# Patient Record
Sex: Female | Born: 1940 | Race: White | Hispanic: No | Marital: Married | Smoking: Never smoker
Health system: Southern US, Community
[De-identification: ages and names within clinical notes are randomized; demographics above are authoritative.]

## PROBLEM LIST (undated history)

## (undated) DIAGNOSIS — F329 Major depressive disorder, single episode, unspecified: Secondary | ICD-10-CM

## (undated) DIAGNOSIS — E669 Obesity, unspecified: Secondary | ICD-10-CM

## (undated) DIAGNOSIS — M25519 Pain in unspecified shoulder: Secondary | ICD-10-CM

## (undated) DIAGNOSIS — M1990 Unspecified osteoarthritis, unspecified site: Secondary | ICD-10-CM

## (undated) DIAGNOSIS — E119 Type 2 diabetes mellitus without complications: Secondary | ICD-10-CM

## (undated) DIAGNOSIS — J45909 Unspecified asthma, uncomplicated: Secondary | ICD-10-CM

## (undated) DIAGNOSIS — N939 Abnormal uterine and vaginal bleeding, unspecified: Secondary | ICD-10-CM

## (undated) DIAGNOSIS — Z8659 Personal history of other mental and behavioral disorders: Secondary | ICD-10-CM

## (undated) DIAGNOSIS — F039 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: Secondary | ICD-10-CM

## (undated) DIAGNOSIS — E785 Hyperlipidemia, unspecified: Secondary | ICD-10-CM

## (undated) DIAGNOSIS — F419 Anxiety disorder, unspecified: Secondary | ICD-10-CM

## (undated) DIAGNOSIS — I10 Essential (primary) hypertension: Secondary | ICD-10-CM

## (undated) DIAGNOSIS — E1165 Type 2 diabetes mellitus with hyperglycemia: Secondary | ICD-10-CM

## (undated) DIAGNOSIS — C50919 Malignant neoplasm of unspecified site of unspecified female breast: Secondary | ICD-10-CM

## (undated) DIAGNOSIS — J45901 Unspecified asthma with (acute) exacerbation: Secondary | ICD-10-CM

## (undated) DIAGNOSIS — M6530 Trigger finger, unspecified finger: Secondary | ICD-10-CM

## (undated) DIAGNOSIS — F32A Depression, unspecified: Secondary | ICD-10-CM

## (undated) DIAGNOSIS — J301 Allergic rhinitis due to pollen: Secondary | ICD-10-CM

## (undated) DIAGNOSIS — K2100 Gastro-esophageal reflux disease with esophagitis, without bleeding: Secondary | ICD-10-CM

## (undated) DIAGNOSIS — I4891 Unspecified atrial fibrillation: Secondary | ICD-10-CM

## (undated) DIAGNOSIS — I495 Sick sinus syndrome: Secondary | ICD-10-CM

## (undated) DIAGNOSIS — C801 Malignant (primary) neoplasm, unspecified: Secondary | ICD-10-CM

## (undated) HISTORY — PX: MASTECTOMY: SHX3

## (undated) HISTORY — PX: CHOLECYSTECTOMY: SHX55

## (undated) HISTORY — DX: Depression, unspecified: F32.A

## (undated) HISTORY — DX: Trigger finger, unspecified finger: M65.30

## (undated) HISTORY — DX: Unspecified dementia, unspecified severity, without behavioral disturbance, psychotic disturbance, mood disturbance, and anxiety: F03.90

## (undated) HISTORY — DX: Allergic rhinitis due to pollen: J30.1

## (undated) HISTORY — PX: RE-EXCISION OF BREAST LUMPECTOMY: SHX6048

## (undated) HISTORY — DX: Anxiety disorder, unspecified: F41.9

## (undated) HISTORY — DX: Gastro-esophageal reflux disease with esophagitis, without bleeding: K21.00

## (undated) HISTORY — DX: Unspecified asthma, uncomplicated: J45.909

## (undated) NOTE — *Deleted (*Deleted)
Diabetes Mellitus and Nutrition, Adult When you have diabetes (diabetes mellitus), it is very important to have healthy eating habits because your blood sugar (glucose) levels are greatly affected by what you eat and drink. Eating healthy foods in the appropriate amounts, at about the same times every day, can help you:  Control your blood glucose.  Lower your risk of heart disease.  Improve your blood pressure.  Reach or maintain a healthy weight. Every person with diabetes is different, and each person has different needs for a meal plan. Your health care provider may recommend that you work with a diet and nutrition specialist (dietitian) to make a meal plan that is best for you. Your meal plan may vary depending on factors such as:  The calories you need.  The medicines you take.  Your weight.  Your blood glucose, blood pressure, and cholesterol levels.  Your activity level.  Other health conditions you have, such as heart or kidney disease. How do carbohydrates affect me? Carbohydrates, also called carbs, affect your blood glucose level more than any other type of food. Eating carbs naturally raises the amount of glucose in your blood. Carb counting is a method for keeping track of how many carbs you eat. Counting carbs is important to keep your blood glucose at a healthy level, especially if you use insulin or take certain oral diabetes medicines. It is important to know how many carbs you can safely have in each meal. This is different for every person. Your dietitian can help you calculate how many carbs you should have at each meal and for each snack. Foods that contain carbs include:  Bread, cereal, rice, pasta, and crackers.  Potatoes and corn.  Peas, beans, and lentils.  Milk and yogurt.  Fruit and juice.  Desserts, such as cakes, cookies, ice cream, and candy. How does alcohol affect me? Alcohol can cause a sudden decrease in blood glucose (hypoglycemia),  especially if you use insulin or take certain oral diabetes medicines. Hypoglycemia can be a life-threatening condition. Symptoms of hypoglycemia (sleepiness, dizziness, and confusion) are similar to symptoms of having too much alcohol. If your health care provider says that alcohol is safe for you, follow these guidelines:  Limit alcohol intake to no more than 1 drink per day for nonpregnant women and 2 drinks per day for men. One drink equals 12 oz of beer, 5 oz of wine, or 1 oz of hard liquor.  Do not drink on an empty stomach.  Keep yourself hydrated with water, diet soda, or unsweetened iced tea.  Keep in mind that regular soda, juice, and other mixers may contain a lot of sugar and must be counted as carbs. What are tips for following this plan?  Reading food labels  Start by checking the serving size on the "Nutrition Facts" label of packaged foods and drinks. The amount of calories, carbs, fats, and other nutrients listed on the label is based on one serving of the item. Many items contain more than one serving per package.  Check the total grams (g) of carbs in one serving. You can calculate the number of servings of carbs in one serving by dividing the total carbs by 15. For example, if a food has 30 g of total carbs, it would be equal to 2 servings of carbs.  Check the number of grams (g) of saturated and trans fats in one serving. Choose foods that have low or no amount of these fats.  Check the number of   milligrams (mg) of salt (sodium) in one serving. Most people should limit total sodium intake to less than 2,300 mg per day.  Always check the nutrition information of foods labeled as "low-fat" or "nonfat". These foods may be higher in added sugar or refined carbs and should be avoided.  Talk to your dietitian to identify your daily goals for nutrients listed on the label. Shopping  Avoid buying canned, premade, or processed foods. These foods tend to be high in fat, sodium,  and added sugar.  Shop around the outside edge of the grocery store. This includes fresh fruits and vegetables, bulk grains, fresh meats, and fresh dairy. Cooking  Use low-heat cooking methods, such as baking, instead of high-heat cooking methods like deep frying.  Cook using healthy oils, such as olive, canola, or sunflower oil.  Avoid cooking with butter, cream, or high-fat meats. Meal planning  Eat meals and snacks regularly, preferably at the same times every day. Avoid going long periods of time without eating.  Eat foods high in fiber, such as fresh fruits, vegetables, beans, and whole grains. Talk to your dietitian about how many servings of carbs you can eat at each meal.  Eat 4-6 ounces (oz) of lean protein each day, such as lean meat, chicken, fish, eggs, or tofu. One oz of lean protein is equal to: ? 1 oz of meat, chicken, or fish. ? 1 egg. ?  cup of tofu.  Eat some foods each day that contain healthy fats, such as avocado, nuts, seeds, and fish. Lifestyle  Check your blood glucose regularly.  Exercise regularly as told by your health care provider. This may include: ? 150 minutes of moderate-intensity or vigorous-intensity exercise each week. This could be brisk walking, biking, or water aerobics. ? Stretching and doing strength exercises, such as yoga or weightlifting, at least 2 times a week.  Take medicines as told by your health care provider.  Do not use any products that contain nicotine or tobacco, such as cigarettes and e-cigarettes. If you need help quitting, ask your health care provider.  Work with a counselor or diabetes educator to identify strategies to manage stress and any emotional and social challenges. Questions to ask a health care provider  Do I need to meet with a diabetes educator?  Do I need to meet with a dietitian?  What number can I call if I have questions?  When are the best times to check my blood glucose? Where to find more  information:  American Diabetes Association: diabetes.org  Academy of Nutrition and Dietetics: www.eatright.org  National Institute of Diabetes and Digestive and Kidney Diseases (NIH): www.niddk.nih.gov Summary  A healthy meal plan will help you control your blood glucose and maintain a healthy lifestyle.  Working with a diet and nutrition specialist (dietitian) can help you make a meal plan that is best for you.  Keep in mind that carbohydrates (carbs) and alcohol have immediate effects on your blood glucose levels. It is important to count carbs and to use alcohol carefully. This information is not intended to replace advice given to you by your health care provider. Make sure you discuss any questions you have with your health care provider. Document Revised: 08/13/2017 Document Reviewed: 10/05/2016 Elsevier Patient Education  2020 Elsevier Inc.  

---
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
## 2001-02-18 ENCOUNTER — Ambulatory Visit (HOSPITAL_COMMUNITY): Admission: RE | Admit: 2001-02-18 | Discharge: 2001-02-18 | Payer: Self-pay | Admitting: General Surgery

## 2001-02-21 ENCOUNTER — Emergency Department (HOSPITAL_COMMUNITY): Admission: EM | Admit: 2001-02-21 | Discharge: 2001-02-21 | Payer: Self-pay | Admitting: *Deleted

## 2001-02-21 ENCOUNTER — Encounter: Payer: Self-pay | Admitting: *Deleted

## 2001-03-08 ENCOUNTER — Ambulatory Visit (HOSPITAL_COMMUNITY): Admission: RE | Admit: 2001-03-08 | Discharge: 2001-03-08 | Payer: Self-pay | Admitting: Pulmonary Disease

## 2001-03-11 ENCOUNTER — Inpatient Hospital Stay (HOSPITAL_COMMUNITY): Admission: EM | Admit: 2001-03-11 | Discharge: 2001-03-12 | Payer: Self-pay | Admitting: Emergency Medicine

## 2001-03-11 ENCOUNTER — Ambulatory Visit (HOSPITAL_COMMUNITY): Admission: RE | Admit: 2001-03-11 | Discharge: 2001-03-11 | Payer: Self-pay | Admitting: Pulmonary Disease

## 2001-03-11 ENCOUNTER — Encounter: Payer: Self-pay | Admitting: Emergency Medicine

## 2001-03-11 ENCOUNTER — Encounter: Payer: Self-pay | Admitting: General Surgery

## 2001-03-12 ENCOUNTER — Encounter: Payer: Self-pay | Admitting: General Surgery

## 2001-03-16 ENCOUNTER — Ambulatory Visit (HOSPITAL_COMMUNITY): Admission: RE | Admit: 2001-03-16 | Discharge: 2001-03-16 | Payer: Self-pay | Admitting: General Surgery

## 2001-03-16 ENCOUNTER — Encounter: Payer: Self-pay | Admitting: General Surgery

## 2001-04-18 ENCOUNTER — Ambulatory Visit (HOSPITAL_COMMUNITY): Admission: RE | Admit: 2001-04-18 | Discharge: 2001-04-18 | Payer: Self-pay | Admitting: Pulmonary Disease

## 2003-12-26 ENCOUNTER — Ambulatory Visit (HOSPITAL_COMMUNITY): Admission: RE | Admit: 2003-12-26 | Discharge: 2003-12-26 | Payer: Self-pay | Admitting: Internal Medicine

## 2009-02-19 ENCOUNTER — Ambulatory Visit (HOSPITAL_COMMUNITY): Admission: RE | Admit: 2009-02-19 | Discharge: 2009-02-19 | Payer: Self-pay | Admitting: Pulmonary Disease

## 2010-06-05 ENCOUNTER — Ambulatory Visit (HOSPITAL_COMMUNITY): Admission: RE | Admit: 2010-06-05 | Discharge: 2010-06-05 | Payer: Self-pay | Admitting: Pulmonary Disease

## 2011-01-22 ENCOUNTER — Other Ambulatory Visit (HOSPITAL_COMMUNITY): Payer: Self-pay | Admitting: Pulmonary Disease

## 2011-01-22 ENCOUNTER — Ambulatory Visit (HOSPITAL_COMMUNITY)
Admission: RE | Admit: 2011-01-22 | Discharge: 2011-01-22 | Disposition: A | Discharge status: Home / Self Care | Payer: Medicare Other | Source: Ambulatory Visit | Attending: Pulmonary Disease | Admitting: Pulmonary Disease

## 2011-01-22 DIAGNOSIS — M25519 Pain in unspecified shoulder: Secondary | ICD-10-CM | POA: Insufficient documentation

## 2011-01-22 DIAGNOSIS — W208XXA Other cause of strike by thrown, projected or falling object, initial encounter: Secondary | ICD-10-CM | POA: Insufficient documentation

## 2011-01-22 DIAGNOSIS — R52 Pain, unspecified: Secondary | ICD-10-CM

## 2011-01-22 DIAGNOSIS — R0789 Other chest pain: Secondary | ICD-10-CM | POA: Insufficient documentation

## 2011-01-22 DIAGNOSIS — S298XXA Other specified injuries of thorax, initial encounter: Secondary | ICD-10-CM | POA: Insufficient documentation

## 2011-01-30 NOTE — H&P (Signed)
[place]. [facility]  Patient:    Amy Hoffman, MELCHER                      MRN: [id number] Adm. Date:  [id number] Attending:  Tobey Bride CC:         Kari Baars, M.D., [place]   History and Physical  CHIEF COMPLAINT:  Abdominal pain and malaise.  HISTORY OF PRESENT ILLNESS:  Ms. Dorrough is a 70 year old white female three weeks status post laparoscopic cholecystectomy at [facility].  She initially did well and was discharged within 24 hours.  She states that about three to four days postop, she developed the fairly sudden onset of epigastric and right upper quadrant abdominal pain radiating to her back and shoulder. She was seen at that time in the [facility] Emergency Room, and no specific diagnosis was established.  She was discharged to home.  She states that over the past two weeks she thought the pain was getting slightly better, but she has had persistent discomfort and has had worsening malaise and weakness.  She denies fever or chills.  She has had some nausea and vomited earlier today.  PAST MEDICAL HISTORY:  Surgical history is significant for carcinoma of the breast status post lumpectomy, chemotherapy, and radiation in 1995.  She had a right mastectomy in 1997 for local recurrence.  She is treated for adult onset diabetes mellitus.  CURRENT MEDICATIONS: 1. Amaryl 2 mg daily. 2. Arimidex daily.  ALLERGIES:  No known drug allergies.  SOCIAL HISTORY:  She is married and accompanied by her husband.  She does not smoke cigarettes or drink alcohol.  FAMILY HISTORY:  Noncontributory.  REVIEW OF SYSTEMS:  General: Positive for malaise and weakness with this illness only.  Respiratory: Denies chronic cough, shortness of breath, or wheezing.  She does have some mild shortness of breath with this illness. Cardiac: No chest pain or history of cardiac disease.  GI: Positive as above. Hematologic: No history of abnormal bleeding or  blood clots.  PHYSICAL EXAMINATION:  VITAL SIGNS:  Temperature 98.7, pulse 96, respirations 20, blood pressure 152/78.  GENERAL:  She is a well-nourished, well-developed white female who appears mildly ill.  SKIN:  Warm and dry without rash or infection.  HEENT:  Sclerae nonicteric.  Nares and oropharynx clear.  NECK:  No adenopathy.  LUNGS:  Decreased breath sounds at the right base.  BREASTS:  Status post right mastectomy with no evidence of local or regional recurrence.  No mass on the left.  CARDIAC:  Regular tachycardia without murmurs.  No peripheral edema.  ABDOMEN:  Moderate distention.  Healing laparoscopic incisions without infection.  There is tenderness and guarding in the right upper quadrant well localized.  EXTREMITIES:  No edema or deformity.  NEUROLOGIC:  Grossly normal.  ADMISSION LABORATORY DATA:  Pending.  CT scan performed at [facility] was reviewed which shows a very large fluid and gas collection lateral to the liver and in the subphrenic space tracking down toward the porta hepatis.  ASSESSMENT/PLAN:  Right upper quadrant abscess or fluid collection three weeks following laparoscopic cholecystectomy.  I suspect this represents a contained bile leak.  Cannot rule out gastrointestinal perforation or other cause of the abscess.  We will proceed with percutaneous drainage today and antibiotic coverage.  If this is bilious, will get a HIDA scan to evaluate for bile leak, and she may need an ERCP. DD:  03/11/01 TD:  03/11/01 Job: [id number]  [id number]

## 2011-01-30 NOTE — Op Note (Signed)
NAME:  Amy Hoffman, Amy Hoffman                         ACCOUNT NO.:  [id number]   MEDICAL RECORD NO.:  [id number]                   PATIENT TYPE:  AMB   LOCATION:  DAY                                  FACILITY:  [facility]   PHYSICIAN:  Lionel December, M.D.                 DATE OF BIRTH:  [birth year]   DATE OF PROCEDURE:  12/26/2003  DATE OF DISCHARGE:                                 OPERATIVE REPORT   PROCEDURE:  Total colonoscopy.   INDICATIONS:  Amy Hoffman is a 69 year old Caucasian female who is undergoing  screening colonoscopy.  While the family history is negative for colorectal  carcinoma, the personal history is positive for breast CA.   The procedure and risks were reviewed with the patient and informed consent  was obtained.   PREMEDICATIONS:  Demerol 50 mg IV, Versed 8 mg IV in divided doses.   FINDINGS:  The procedure performed in the endoscopy suite.  The patient's  vital signs and O2 saturations were monitored during the procedure and  remained stable.  The patient was placed in the left lateral decubitus  position and rectal examination was performed.  No abnormalities were noted  on external or digital exam.  The Olympus video scope was placed in the  rectum and advanced under direct vision through the sigmoid colon and  beyond.  The preparation was satisfactory.  The scope was passed into the  cecum which was identified by the appendiceal orifice and the ileocecal  valve.  Pictures were taken for the record.  As the scope was withdrawn, the  colonic mucosa was carefully examined and there was a 3-mm polyp at the  proximal transverse colon which was ablated by cold biopsy.  The mucosa in  the rest of the colon was normal.  The rectal mucosa similarly was normal.  The scope was retroflexed to examine the anorectal junction.  There was a  prominent anal papilla at the anorectal junction.  Pictures were taken for  the record. The endoscope was straightened and withdrawn.  The patient  tolerated the procedure well.   FINAL DIAGNOSES:  1. Small polyp at proximal transverse colon which was ablated by cold     biopsy.  2. Rest of colonoscopy was normal.   RECOMMENDATIONS:  1. Standard instructions given.  2. I will be contacting the patient with biopsy results and further     recommendations.      ___________________________________________                                            Lionel December, M.D.   NR/MEDQ  D:  12/26/2003  Hoffman:  12/26/2003  Job:  [id number]   cc:   Ramon Dredge L. Juanetta Gosling, M.D.  [address]  [place]  [place] [address]  Fax: [phone]

## 2011-09-21 DIAGNOSIS — J440 Chronic obstructive pulmonary disease with acute lower respiratory infection: Secondary | ICD-10-CM | POA: Diagnosis not present

## 2011-10-06 DIAGNOSIS — Z170 Estrogen receptor positive status [ER+]: Secondary | ICD-10-CM | POA: Diagnosis not present

## 2011-10-06 DIAGNOSIS — R922 Inconclusive mammogram: Secondary | ICD-10-CM | POA: Diagnosis not present

## 2011-10-06 DIAGNOSIS — Z780 Asymptomatic menopausal state: Secondary | ICD-10-CM | POA: Diagnosis not present

## 2011-10-06 DIAGNOSIS — N6459 Other signs and symptoms in breast: Secondary | ICD-10-CM | POA: Diagnosis not present

## 2011-10-06 DIAGNOSIS — C50919 Malignant neoplasm of unspecified site of unspecified female breast: Secondary | ICD-10-CM | POA: Diagnosis not present

## 2011-10-06 DIAGNOSIS — Z9221 Personal history of antineoplastic chemotherapy: Secondary | ICD-10-CM | POA: Diagnosis not present

## 2011-10-06 DIAGNOSIS — Z79811 Long term (current) use of aromatase inhibitors: Secondary | ICD-10-CM | POA: Diagnosis not present

## 2011-10-06 DIAGNOSIS — F411 Generalized anxiety disorder: Secondary | ICD-10-CM | POA: Diagnosis not present

## 2011-10-06 DIAGNOSIS — Z79899 Other long term (current) drug therapy: Secondary | ICD-10-CM | POA: Diagnosis not present

## 2011-10-06 DIAGNOSIS — Z853 Personal history of malignant neoplasm of breast: Secondary | ICD-10-CM | POA: Diagnosis not present

## 2011-10-06 DIAGNOSIS — Z9010 Acquired absence of unspecified breast and nipple: Secondary | ICD-10-CM | POA: Diagnosis not present

## 2011-10-06 DIAGNOSIS — Z923 Personal history of irradiation: Secondary | ICD-10-CM | POA: Diagnosis not present

## 2012-01-01 DIAGNOSIS — C50919 Malignant neoplasm of unspecified site of unspecified female breast: Secondary | ICD-10-CM | POA: Diagnosis not present

## 2012-01-01 DIAGNOSIS — E119 Type 2 diabetes mellitus without complications: Secondary | ICD-10-CM | POA: Diagnosis not present

## 2012-01-01 DIAGNOSIS — R262 Difficulty in walking, not elsewhere classified: Secondary | ICD-10-CM | POA: Diagnosis not present

## 2012-07-21 DIAGNOSIS — Z23 Encounter for immunization: Secondary | ICD-10-CM | POA: Diagnosis not present

## 2012-07-21 DIAGNOSIS — E669 Obesity, unspecified: Secondary | ICD-10-CM | POA: Diagnosis not present

## 2012-07-21 DIAGNOSIS — Z0000 Encounter for general adult medical examination without abnormal findings: Secondary | ICD-10-CM | POA: Diagnosis not present

## 2012-07-21 DIAGNOSIS — E785 Hyperlipidemia, unspecified: Secondary | ICD-10-CM | POA: Diagnosis not present

## 2012-07-21 DIAGNOSIS — E109 Type 1 diabetes mellitus without complications: Secondary | ICD-10-CM | POA: Diagnosis not present

## 2012-07-21 DIAGNOSIS — Z124 Encounter for screening for malignant neoplasm of cervix: Secondary | ICD-10-CM | POA: Diagnosis not present

## 2012-07-21 LAB — VITAMIN D 25 HYDROXY (VIT D DEFICIENCY, FRACTURES): Vit D, 25-Hydroxy: 16

## 2012-07-21 LAB — HM PAP SMEAR: HM Pap smear: NEGATIVE

## 2012-10-07 DIAGNOSIS — Z853 Personal history of malignant neoplasm of breast: Secondary | ICD-10-CM | POA: Diagnosis not present

## 2012-10-07 DIAGNOSIS — Z9010 Acquired absence of unspecified breast and nipple: Secondary | ICD-10-CM | POA: Diagnosis not present

## 2012-10-07 DIAGNOSIS — C50919 Malignant neoplasm of unspecified site of unspecified female breast: Secondary | ICD-10-CM | POA: Diagnosis not present

## 2012-11-16 DIAGNOSIS — H524 Presbyopia: Secondary | ICD-10-CM | POA: Diagnosis not present

## 2012-11-16 DIAGNOSIS — E119 Type 2 diabetes mellitus without complications: Secondary | ICD-10-CM | POA: Diagnosis not present

## 2012-11-16 DIAGNOSIS — H52229 Regular astigmatism, unspecified eye: Secondary | ICD-10-CM | POA: Diagnosis not present

## 2012-11-16 DIAGNOSIS — H5200 Hypermetropia, unspecified eye: Secondary | ICD-10-CM | POA: Diagnosis not present

## 2013-01-04 ENCOUNTER — Ambulatory Visit (HOSPITAL_COMMUNITY)
Admission: RE | Admit: 2013-01-04 | Discharge: 2013-01-04 | Disposition: A | Discharge status: Home / Self Care | Payer: Medicare Other | Source: Ambulatory Visit | Attending: Pulmonary Disease | Admitting: Pulmonary Disease

## 2013-01-04 ENCOUNTER — Other Ambulatory Visit (HOSPITAL_COMMUNITY): Payer: Self-pay | Admitting: Pulmonary Disease

## 2013-01-04 DIAGNOSIS — M25559 Pain in unspecified hip: Secondary | ICD-10-CM | POA: Insufficient documentation

## 2013-01-04 DIAGNOSIS — M25552 Pain in left hip: Secondary | ICD-10-CM

## 2013-01-04 DIAGNOSIS — Z853 Personal history of malignant neoplasm of breast: Secondary | ICD-10-CM | POA: Insufficient documentation

## 2013-01-10 ENCOUNTER — Other Ambulatory Visit (HOSPITAL_COMMUNITY): Payer: Self-pay | Admitting: Pulmonary Disease

## 2013-01-10 DIAGNOSIS — M25552 Pain in left hip: Secondary | ICD-10-CM

## 2013-01-12 ENCOUNTER — Encounter (HOSPITAL_COMMUNITY)
Admission: RE | Admit: 2013-01-12 | Discharge: 2013-01-12 | Disposition: A | Discharge status: Home / Self Care | Payer: Medicare Other | Source: Ambulatory Visit | Attending: Pulmonary Disease | Admitting: Pulmonary Disease

## 2013-01-12 ENCOUNTER — Encounter (HOSPITAL_COMMUNITY): Payer: Self-pay

## 2013-01-12 DIAGNOSIS — M25559 Pain in unspecified hip: Secondary | ICD-10-CM | POA: Diagnosis not present

## 2013-01-12 DIAGNOSIS — M25552 Pain in left hip: Secondary | ICD-10-CM

## 2013-01-12 HISTORY — DX: Type 2 diabetes mellitus without complications: E11.9

## 2013-01-12 HISTORY — DX: Malignant (primary) neoplasm, unspecified: C80.1

## 2013-01-12 MED ORDER — TECHNETIUM TC 99M MEDRONATE IV KIT
25.0000 | PACK | Freq: Once | INTRAVENOUS | Status: AC | PRN
  Administered 2013-01-12: 25 via INTRAVENOUS

## 2013-01-23 ENCOUNTER — Emergency Department (HOSPITAL_COMMUNITY): Payer: Medicare Other

## 2013-01-23 ENCOUNTER — Encounter (HOSPITAL_COMMUNITY): Payer: Self-pay | Admitting: *Deleted

## 2013-01-23 ENCOUNTER — Emergency Department (HOSPITAL_COMMUNITY)
Admission: EM | Admit: 2013-01-23 | Discharge: 2013-01-24 | Disposition: A | Discharge status: Home / Self Care | Payer: Medicare Other | Attending: Emergency Medicine | Admitting: Emergency Medicine

## 2013-01-23 DIAGNOSIS — R062 Wheezing: Secondary | ICD-10-CM

## 2013-01-23 DIAGNOSIS — R509 Fever, unspecified: Secondary | ICD-10-CM

## 2013-01-23 DIAGNOSIS — R05 Cough: Secondary | ICD-10-CM | POA: Diagnosis not present

## 2013-01-23 DIAGNOSIS — Z8505 Personal history of malignant neoplasm of liver: Secondary | ICD-10-CM | POA: Diagnosis not present

## 2013-01-23 DIAGNOSIS — J069 Acute upper respiratory infection, unspecified: Secondary | ICD-10-CM | POA: Insufficient documentation

## 2013-01-23 DIAGNOSIS — E119 Type 2 diabetes mellitus without complications: Secondary | ICD-10-CM | POA: Diagnosis not present

## 2013-01-23 DIAGNOSIS — J3489 Other specified disorders of nose and nasal sinuses: Secondary | ICD-10-CM | POA: Insufficient documentation

## 2013-01-23 DIAGNOSIS — Z853 Personal history of malignant neoplasm of breast: Secondary | ICD-10-CM | POA: Diagnosis not present

## 2013-01-23 DIAGNOSIS — R0602 Shortness of breath: Secondary | ICD-10-CM | POA: Diagnosis not present

## 2013-01-23 LAB — CBC
HCT: 36 % (ref 36.0–46.0)
Hemoglobin: 12.3 g/dL (ref 12.0–15.0)
MCH: 30 pg (ref 26.0–34.0)
MCHC: 34.2 g/dL (ref 30.0–36.0)
MCV: 87.8 fL (ref 78.0–100.0)
Platelets: 242 10*3/uL (ref 150–400)
RBC: 4.1 MIL/uL (ref 3.87–5.11)
RDW: 12.7 % (ref 11.5–15.5)
WBC: 9.4 10*3/uL (ref 4.0–10.5)

## 2013-01-23 LAB — URINE MICROSCOPIC-ADD ON

## 2013-01-23 LAB — BASIC METABOLIC PANEL
BUN: 10 mg/dL (ref 6–23)
CO2: 26 mEq/L (ref 19–32)
Calcium: 9.1 mg/dL (ref 8.4–10.5)
Chloride: 96 mEq/L (ref 96–112)
Creatinine, Ser: 0.67 mg/dL (ref 0.50–1.10)
GFR calc Af Amer: >90 mL/min (ref >90)
GFR calc non Af Amer: 86 mL/min — ABNORMAL LOW (ref >90)
Glucose, Bld: 296 mg/dL — ABNORMAL HIGH (ref 70–99)
Potassium: 3.9 mEq/L (ref 3.5–5.1)
Sodium: 134 mEq/L — ABNORMAL LOW (ref 135–145)

## 2013-01-23 LAB — URINALYSIS, ROUTINE W REFLEX MICROSCOPIC
Bilirubin Urine: NEGATIVE
Glucose, UA: >1000 mg/dL — AB
Ketones, ur: NEGATIVE mg/dL
Nitrite: NEGATIVE
Protein, ur: NEGATIVE mg/dL
Specific Gravity, Urine: 1.01 (ref 1.005–1.030)
Urobilinogen, UA: 0.2 mg/dL (ref 0.0–1.0)
pH: 6 (ref 5.0–8.0)

## 2013-01-23 LAB — LACTIC ACID, PLASMA: Lactic Acid, Venous: 0.7 mmol/L (ref 0.5–2.2)

## 2013-01-23 MED ORDER — LEVOFLOXACIN IN D5W 750 MG/150ML IV SOLN
750.0000 mg | Freq: Once | INTRAVENOUS | Status: AC
  Administered 2013-01-24: 750 mg via INTRAVENOUS
  Filled 2013-01-23: qty 150

## 2013-01-23 MED ORDER — SODIUM CHLORIDE 0.9 % IV SOLN
INTRAVENOUS | Status: DC
  Administered 2013-01-24: via INTRAVENOUS

## 2013-01-23 MED ORDER — ACETAMINOPHEN 325 MG PO TABS
650.0000 mg | ORAL_TABLET | Freq: Once | ORAL | Status: AC
  Administered 2013-01-24: 650 mg via ORAL
  Filled 2013-01-23: qty 2

## 2013-01-23 MED ORDER — METHYLPREDNISOLONE SODIUM SUCC 125 MG IJ SOLR
125.0000 mg | Freq: Once | INTRAMUSCULAR | Status: AC
  Administered 2013-01-24: 125 mg via INTRAVENOUS
  Filled 2013-01-23: qty 2

## 2013-01-23 MED ORDER — ALBUTEROL SULFATE (5 MG/ML) 0.5% IN NEBU
5.0000 mg | INHALATION_SOLUTION | Freq: Once | RESPIRATORY_TRACT | Status: AC
  Administered 2013-01-23: 5 mg via RESPIRATORY_TRACT
  Filled 2013-01-23: qty 1

## 2013-01-23 NOTE — ED Provider Notes (Signed)
History    This chart was scribed for Amy Nielsen, MD by Amy Hoffman, ED Scribe. This patient was seen in room [facility]04/[facility] and the patient's care was started 10:45 PM.   CSN: [id number]  Arrival date & time 01/23/13  2137   None     Chief Complaint  Patient presents with  . Cough     Patient is a 72 y.o. female presenting with cough. The history is provided by the patient. No language interpreter was used.  Cough Cough characteristics:  Productive Severity:  Moderate Onset quality:  Gradual Duration:  5 days Timing:  Constant Progression:  Unchanged Context: exposure to allergens   Associated symptoms: fever, shortness of breath and wheezing     HPI Comments: Amy Hoffman is a 72 y.o. female who presents to the Emergency Department complaining of ongoing, constant, unchanged cough and chest congestion starting 5 days ago. Pt thinks the pollen is causing her symptoms. She has associated SOB and allergy symptoms. She denies h/o lung problems. Pt has h/o breast and liver cancer and DM.   Past Medical History  Diagnosis Date  . Cancer   . Diabetes mellitus without complication     History reviewed. No pertinent past surgical history.  No family history on file.  History  Substance Use Topics  . Smoking status: Not on file  . Smokeless tobacco: Not on file  . Alcohol Use: Not on file    OB History   Grav Para Term Preterm Abortions TAB SAB Ect Mult Living                  Review of Systems  Constitutional: Positive for fever.  HENT: Positive for congestion.   Respiratory: Positive for cough, shortness of breath and wheezing.   All other systems reviewed and are negative.    Allergies  Review of patient's allergies indicates not on file.  Home Medications  No current outpatient prescriptions on file.  BP 119/57  Pulse 115  Temp(Src) 101.1 F (38.4 C) (Oral)  Resp 16  Ht 5\' 5"  (1.651 m)  Wt 171 lb 4.8 oz (77.701 kg)  BMI 28.51 kg/m2  SpO2  96%  Physical Exam  Nursing note and vitals reviewed. Constitutional: She is oriented to person, place, and time. She appears well-developed and well-nourished. No distress.  HENT:  Head: Normocephalic and atraumatic.  Mouth/Throat: Oropharynx is clear and moist. No oropharyngeal exudate.  Voice changes.  Eyes: EOM are normal.  Neck: Neck supple. No tracheal deviation present.  Cardiovascular: Normal rate.   Pulmonary/Chest: Effort normal. No respiratory distress. She has decreased breath sounds. She has wheezes.  Decreased bilateral breath sounds with bilateral expiratory wheezes.   Musculoskeletal: Normal range of motion.  Neurological: She is alert and oriented to person, place, and time.  Skin: Skin is warm and dry.  Psychiatric: She has a normal mood and affect. Her behavior is normal.    ED Course  Procedures (including critical care time)  DIAGNOSTIC STUDIES: Oxygen Saturation is 96% on room air, adequate by my interpretation.  Ambulates no change in RA pulse ox  COORDINATION OF CARE: 11:15 PM Discussed treatment plan with pt at bedside and pt agreed to plan.   Results for orders placed during the hospital encounter of 01/23/13  LACTIC ACID, PLASMA      Result Value Range   Lactic Acid, Venous 0.7  0.5 - 2.2 mmol/L  CBC      Result Value Range   WBC 9.4  4.0 - 10.5 K/uL   RBC 4.10  3.87 - 5.11 MIL/uL   Hemoglobin 12.3  12.0 - 15.0 g/dL   HCT 16.1  09.6 - 04.5 %   MCV 87.8  78.0 - 100.0 fL   MCH 30.0  26.0 - 34.0 pg   MCHC 34.2  30.0 - 36.0 g/dL   RDW 40.9  81.1 - 91.4 %   Platelets 242  150 - 400 K/uL  URINALYSIS, ROUTINE W REFLEX MICROSCOPIC      Result Value Range   Color, Urine YELLOW  YELLOW   APPearance CLEAR  CLEAR   Specific Gravity, Urine 1.010  1.005 - 1.030   pH 6.0  5.0 - 8.0   Glucose, UA >1000 (*) NEGATIVE mg/dL   Hgb urine dipstick TRACE (*) NEGATIVE   Bilirubin Urine NEGATIVE  NEGATIVE   Ketones, ur NEGATIVE  NEGATIVE mg/dL   Protein, ur  NEGATIVE  NEGATIVE mg/dL   Urobilinogen, UA 0.2  0.0 - 1.0 mg/dL   Nitrite NEGATIVE  NEGATIVE   Leukocytes, UA SMALL (*) NEGATIVE  BASIC METABOLIC PANEL      Result Value Range   Sodium 134 (*) 135 - 145 mEq/L   Potassium 3.9  3.5 - 5.1 mEq/L   Chloride 96  96 - 112 mEq/L   CO2 26  19 - 32 mEq/L   Glucose, Bld 296 (*) 70 - 99 mg/dL   BUN 10  6 - 23 mg/dL   Creatinine, Ser 7.82  0.50 - 1.10 mg/dL   Calcium 9.1  8.4 - 95.6 mg/dL   GFR calc non Af Amer 86 (*) >90 mL/min   GFR calc Af Amer >90  >90 mL/min  URINE MICROSCOPIC-ADD ON      Result Value Range   Squamous Epithelial / LPF RARE  RARE   WBC, UA 3-6  <3 WBC/hpf   RBC / HPF 0-2  <3 RBC/hpf   Bacteria, UA RARE  RARE   Dg Chest 2 View  01/23/2013  *RADIOLOGY REPORT*  Clinical Data: Cough, shortness of breath.  CHEST - 2 VIEW  Comparison: 01/22/2011  Findings: Chronic eventration of the right hemidiaphragm, stable. No confluent airspace opacities.  Heart is normal size.  No effusions.  No acute bony abnormality.  IMPRESSION: No acute cardiopulmonary disease.   Original Report Authenticated By: Charlett Nose, M.D.    Albuterol, solumedrol, IV ABX, tylenol  1:11 AM wheezes improved, moving good air repeat pulm exam. PT requesting to be discharged. She agrees to strict return precautions an close PCP follow up tomorrow in clinic. RX provided.  MDM  URI symptoms with fever and wheezing  CXR, labs, UA  Medications provided, improving condition  I personally performed the services described in this documentation, which was scribed in my presence. The recorded information has been reviewed and is accurate.    Amy Nielsen, MD 01/24/13 [phone]

## 2013-01-23 NOTE — ED Notes (Signed)
Cough and chest congestion

## 2013-01-24 MED ORDER — ONDANSETRON HCL 4 MG/2ML IJ SOLN: 4.0000 mg | Freq: Once | INTRAMUSCULAR | Status: AC

## 2013-01-24 MED ORDER — ALBUTEROL SULFATE HFA 108 (90 BASE) MCG/ACT IN AERS
2.0000 | INHALATION_SPRAY | RESPIRATORY_TRACT | Status: DC | PRN
  Filled 2013-01-24: qty 6.7

## 2013-01-24 MED ORDER — PREDNISONE 20 MG PO TABS: 60.0000 mg | ORAL_TABLET | Freq: Every day | ORAL | Status: DC

## 2013-01-24 MED ORDER — LEVOFLOXACIN 500 MG PO TABS: 500.0000 mg | ORAL_TABLET | Freq: Every day | ORAL | Status: DC

## 2013-01-24 MED ORDER — ONDANSETRON HCL 4 MG/2ML IJ SOLN
INTRAMUSCULAR | Status: AC
  Administered 2013-01-24: 4 mg
  Filled 2013-01-24: qty 2

## 2013-01-24 MED ORDER — ALBUTEROL SULFATE HFA 108 (90 BASE) MCG/ACT IN AERS: 1.0000 | INHALATION_SPRAY | Freq: Four times a day (QID) | RESPIRATORY_TRACT | Status: DC | PRN

## 2013-01-24 NOTE — ED Notes (Signed)
Patient denies nausea at this time, reports feels ready to be discharged.

## 2013-01-24 NOTE — ED Notes (Signed)
Patient called me into room complaining of nausea and stated she feels as if she is going to vomit. Patient then vomited. Dr. Dierdre Highman notified, stated to give 4 mg zofran iv.

## 2013-01-30 DIAGNOSIS — E109 Type 1 diabetes mellitus without complications: Secondary | ICD-10-CM | POA: Diagnosis not present

## 2013-01-30 DIAGNOSIS — C50919 Malignant neoplasm of unspecified site of unspecified female breast: Secondary | ICD-10-CM | POA: Diagnosis not present

## 2013-01-30 DIAGNOSIS — J209 Acute bronchitis, unspecified: Secondary | ICD-10-CM | POA: Diagnosis not present

## 2013-01-30 DIAGNOSIS — F329 Major depressive disorder, single episode, unspecified: Secondary | ICD-10-CM | POA: Diagnosis not present

## 2013-02-23 ENCOUNTER — Ambulatory Visit (INDEPENDENT_AMBULATORY_CARE_PROVIDER_SITE_OTHER): Payer: Medicare Other | Admitting: Orthopedic Surgery

## 2013-02-23 VITALS — BP 144/80 | Ht 64.0 in | Wt 169.0 lb

## 2013-02-23 DIAGNOSIS — IMO0002 Reserved for concepts with insufficient information to code with codable children: Secondary | ICD-10-CM | POA: Diagnosis not present

## 2013-02-23 DIAGNOSIS — M5410 Radiculopathy, site unspecified: Secondary | ICD-10-CM

## 2013-02-23 NOTE — Patient Instructions (Signed)
CALL IF PAIN COMES BACK

## 2013-02-23 NOTE — Progress Notes (Signed)
Patient ID: Amy Hoffman, female   DOB: 03-Jun-1941, 72 y.o.   MRN: [id number] Chief Complaint  Patient presents with  . Hip Pain    Left hip pain   Consult has been requested by Dr. Kari Baars  Chief complaint left hip pain the patient reports that about 2 months ago she started having pain in her left hip radiating down her left leg associated with a dull aching sensation which she rates for a 10 and is intermittent associated with tingling but now her symptoms have resolved she'll bone scan a left hip film which showed no evidence of hip fracture or cancerous lesion. Her hip was totally benign  She has a history of diabetes. She had breast surgery 2 procedures and mastectomy. She had cholecystectomy. She has a family history of heart disease cancer and diabetes. She is married she takes care of her grandchild she does some housework cooking and gardening does not smoke or drink  Has no known drug allergies  Review of systems shortness of breath, snoring, tingling unsteady gait nervousness heat intolerance cold intolerance and seasonal allergies remaining systems are negative. Left hip x-ray and report and agree that there is no fracture and no evidence of cancer and no arthritis  Bone scan report no evidence of acute or chronic fracture of the pelvis or hip  BP 144/80  Ht 5\' 4"  (1.626 m)  Wt 169 lb (76.658 kg)  BMI 28.99 kg/m2 Overall appearance is normal she is well groomed good hygiene medium body frame. She is oriented x3 mood and affect are normal she ambulates normally no assistive devices  The left hip has full range of motion with no restriction no contracture full strength is noted in the left leg stability is confirmed by the shuck test. Skin is intact pulses are good sensation is normal  X-rays as described  Impression most likely she had some neuralgia/radicular symptoms in the leg which have been resolved  If this returns she will call us back and we can work up her  lumbar spine

## 2013-07-01 DIAGNOSIS — Z23 Encounter for immunization: Secondary | ICD-10-CM | POA: Diagnosis not present

## 2013-11-06 DIAGNOSIS — E785 Hyperlipidemia, unspecified: Secondary | ICD-10-CM | POA: Diagnosis not present

## 2013-11-06 DIAGNOSIS — N814 Uterovaginal prolapse, unspecified: Secondary | ICD-10-CM | POA: Diagnosis not present

## 2013-11-06 DIAGNOSIS — E109 Type 1 diabetes mellitus without complications: Secondary | ICD-10-CM | POA: Diagnosis not present

## 2013-11-06 DIAGNOSIS — R197 Diarrhea, unspecified: Secondary | ICD-10-CM | POA: Diagnosis not present

## 2013-11-14 DIAGNOSIS — R209 Unspecified disturbances of skin sensation: Secondary | ICD-10-CM | POA: Diagnosis not present

## 2013-11-14 DIAGNOSIS — R634 Abnormal weight loss: Secondary | ICD-10-CM | POA: Diagnosis not present

## 2013-11-14 DIAGNOSIS — Z789 Other specified health status: Secondary | ICD-10-CM | POA: Diagnosis not present

## 2013-11-14 DIAGNOSIS — C50919 Malignant neoplasm of unspecified site of unspecified female breast: Secondary | ICD-10-CM | POA: Diagnosis not present

## 2013-11-14 DIAGNOSIS — R030 Elevated blood-pressure reading, without diagnosis of hypertension: Secondary | ICD-10-CM | POA: Diagnosis not present

## 2013-11-14 DIAGNOSIS — R159 Full incontinence of feces: Secondary | ICD-10-CM | POA: Diagnosis not present

## 2013-12-14 ENCOUNTER — Ambulatory Visit: Payer: Medicare Other | Admitting: Cardiology

## 2013-12-14 DIAGNOSIS — R918 Other nonspecific abnormal finding of lung field: Secondary | ICD-10-CM | POA: Diagnosis not present

## 2013-12-14 DIAGNOSIS — R933 Abnormal findings on diagnostic imaging of other parts of digestive tract: Secondary | ICD-10-CM | POA: Diagnosis not present

## 2013-12-14 DIAGNOSIS — K838 Other specified diseases of biliary tract: Secondary | ICD-10-CM | POA: Diagnosis not present

## 2013-12-14 DIAGNOSIS — C50919 Malignant neoplasm of unspecified site of unspecified female breast: Secondary | ICD-10-CM | POA: Diagnosis not present

## 2013-12-14 DIAGNOSIS — K5730 Diverticulosis of large intestine without perforation or abscess without bleeding: Secondary | ICD-10-CM | POA: Diagnosis not present

## 2013-12-14 DIAGNOSIS — Z13820 Encounter for screening for osteoporosis: Secondary | ICD-10-CM | POA: Diagnosis not present

## 2013-12-14 DIAGNOSIS — R634 Abnormal weight loss: Secondary | ICD-10-CM | POA: Diagnosis not present

## 2013-12-14 DIAGNOSIS — M899 Disorder of bone, unspecified: Secondary | ICD-10-CM | POA: Diagnosis not present

## 2013-12-14 DIAGNOSIS — Z789 Other specified health status: Secondary | ICD-10-CM | POA: Diagnosis not present

## 2013-12-22 DIAGNOSIS — Z853 Personal history of malignant neoplasm of breast: Secondary | ICD-10-CM | POA: Diagnosis not present

## 2013-12-22 DIAGNOSIS — C50919 Malignant neoplasm of unspecified site of unspecified female breast: Secondary | ICD-10-CM | POA: Diagnosis not present

## 2014-01-08 ENCOUNTER — Ambulatory Visit (INDEPENDENT_AMBULATORY_CARE_PROVIDER_SITE_OTHER): Payer: Medicare Other | Admitting: Cardiology

## 2014-01-08 VITALS — BP 138/62 | HR 86 | Ht 63.0 in | Wt 168.0 lb

## 2014-01-08 DIAGNOSIS — I10 Essential (primary) hypertension: Secondary | ICD-10-CM | POA: Diagnosis not present

## 2014-01-08 DIAGNOSIS — R0602 Shortness of breath: Secondary | ICD-10-CM | POA: Diagnosis not present

## 2014-01-08 NOTE — Patient Instructions (Signed)
Your physician recommends that you schedule a follow-up appointment in: 1 month    Your physician recommends that you continue on your current medications as directed. Please refer to the Current Medication list given to you today.      Your physician has requested that you have an echocardiogram. Echocardiography is a painless test that uses sound waves to create images of your heart. It provides your doctor with information about the size and shape of your heart and how well your heart's chambers and valves are working. This procedure takes approximately one hour. There are no restrictions for this procedure.         Thank you for choosing [facility] !         

## 2014-01-08 NOTE — Progress Notes (Signed)
Clinical Summary Ms. Amy Hoffman is a 73 y.o.female seen today as a new patient for SOB.  1. SOB - 01/2013 episode of SOB, seen in ER.  - typically occurs with exertion. Often with going up hill. Can have wheezing associated with it - no orthopnea. Occas LE edema - no chest pain   2. Breast CA - followed at [facility], hx of recurrent breast CA. Previous lumpectomy, radiaction, and chemo. Currently on arimidex.   Past Medical History  Diagnosis Date  . Cancer   . Diabetes mellitus without complication      No Known Allergies   Current Outpatient Prescriptions  Medication Sig Dispense Refill  . albuterol (PROVENTIL HFA;VENTOLIN HFA) 108 (90 BASE) MCG/ACT inhaler Inhale 1-2 puffs into the lungs every 6 (six) hours as needed for wheezing.  1 Inhaler  0  . dextromethorphan-guaiFENesin (MUCINEX DM) 30-600 MG per 12 hr tablet Take 1 tablet by mouth every 12 (twelve) hours.      Marland Kitchen glimepiride (AMARYL) 4 MG tablet Take 4 mg by mouth 2 (two) times daily.      Marland Kitchen ibuprofen (ADVIL,MOTRIN) 200 MG tablet Take 200 mg by mouth every 6 (six) hours as needed for pain.      Marland Kitchen levofloxacin (LEVAQUIN) 500 MG tablet Take 1 tablet (500 mg total) by mouth daily.  7 tablet  0  . Menthol (RICOLA HERB) 2.6 MG LOZG Use as directed 1 lozenge in the mouth or throat daily as needed (for relief).      . metFORMIN (GLUCOPHAGE) 500 MG tablet Take 500 mg by mouth 2 (two) times daily.      . predniSONE (DELTASONE) 20 MG tablet Take 3 tablets (60 mg total) by mouth daily.  15 tablet  0  . simvastatin (ZOCOR) 40 MG tablet Take 40 mg by mouth every evening.      . venlafaxine XR (EFFEXOR-XR) 75 MG 24 hr capsule Take 75 mg by mouth daily.       No current facility-administered medications for this visit.     No past surgical history on file.   No Known Allergies    No family history on file.   Social History Amy Hoffman has no tobacco history on file. Amy Hoffman has no alcohol history on file.   Review of  Systems CONSTITUTIONAL: No weight loss, fever, chills, weakness or fatigue.  HEENT: Eyes: No visual loss, blurred vision, double vision or yellow sclerae.No hearing loss, sneezing, congestion, runny nose or sore throat.  SKIN: No rash or itching.  CARDIOVASCULAR: per HPI RESPIRATORY: +SOB, wheezing.  GASTROINTESTINAL: No anorexia, nausea, vomiting or diarrhea. No abdominal pain or blood.  GENITOURINARY: No burning on urination, no polyuria NEUROLOGICAL: No headache, dizziness, syncope, paralysis, ataxia, numbness or tingling in the extremities. No change in bowel or bladder control.  MUSCULOSKELETAL: No muscle, back pain, joint pain or stiffness.  LYMPHATICS: No enlarged nodes. No history of splenectomy.  PSYCHIATRIC: No history of depression or anxiety.  ENDOCRINOLOGIC: No reports of sweating, cold or heat intolerance. No polyuria or polydipsia.  Marland Kitchen   Physical Examination p 86 bp 138/62 Wt 168 BMI 30 Gen: resting comfortably, no acute distress HEENT: no scleral icterus, pupils equal round and reactive, no palptable cervical adenopathy,  CV:RRR, no 2/6 systolic murmur RUSB, no JVD Resp: Clear to auscultation bilaterally GI: abdomen is soft, non-tender, non-distended, normal bowel sounds, no hepatosplenomegaly MSK: extremities are warm, no edema.  Skin: warm, no rash Neuro:  no focal deficits Psych: appropriate  affect     Assessment and Plan  1. SOB - unclear etiology, given her history of breast CA with prior chemo she is at risk for systolic/diastolic dysfunction. She also has evidence of a mild heart murmur - will obtain echo as initial evaluation        Arnoldo Lenis, M.D., F.A.C.C.

## 2014-01-10 ENCOUNTER — Ambulatory Visit (HOSPITAL_COMMUNITY)
Admission: RE | Admit: 2014-01-10 | Discharge: 2014-01-10 | Disposition: A | Discharge status: Home / Self Care | Payer: Medicare Other | Source: Ambulatory Visit | Attending: Cardiology | Admitting: Cardiology

## 2014-01-10 DIAGNOSIS — E119 Type 2 diabetes mellitus without complications: Secondary | ICD-10-CM | POA: Diagnosis not present

## 2014-01-10 DIAGNOSIS — R0602 Shortness of breath: Secondary | ICD-10-CM | POA: Diagnosis not present

## 2014-01-10 DIAGNOSIS — I369 Nonrheumatic tricuspid valve disorder, unspecified: Secondary | ICD-10-CM | POA: Diagnosis not present

## 2014-01-10 DIAGNOSIS — C50919 Malignant neoplasm of unspecified site of unspecified female breast: Secondary | ICD-10-CM | POA: Insufficient documentation

## 2014-01-10 DIAGNOSIS — I10 Essential (primary) hypertension: Secondary | ICD-10-CM | POA: Diagnosis not present

## 2014-01-10 NOTE — Progress Notes (Signed)
*  PRELIMINARY RESULTS* Echocardiogram 2D Echocardiogram has been performed.  Amy Hoffman 01/10/2014, 2:35 PM

## 2014-01-14 ENCOUNTER — Encounter (HOSPITAL_COMMUNITY): Payer: Self-pay | Admitting: Emergency Medicine

## 2014-01-14 ENCOUNTER — Emergency Department (HOSPITAL_COMMUNITY): Payer: Medicare Other

## 2014-01-14 ENCOUNTER — Emergency Department (HOSPITAL_COMMUNITY)
Admission: EM | Admit: 2014-01-14 | Discharge: 2014-01-15 | Disposition: A | Discharge status: Home / Self Care | Payer: Medicare Other | Attending: Emergency Medicine | Admitting: Emergency Medicine

## 2014-01-14 DIAGNOSIS — R059 Cough, unspecified: Secondary | ICD-10-CM | POA: Diagnosis not present

## 2014-01-14 DIAGNOSIS — E1165 Type 2 diabetes mellitus with hyperglycemia: Secondary | ICD-10-CM

## 2014-01-14 DIAGNOSIS — Z79899 Other long term (current) drug therapy: Secondary | ICD-10-CM | POA: Insufficient documentation

## 2014-01-14 DIAGNOSIS — J45901 Unspecified asthma with (acute) exacerbation: Secondary | ICD-10-CM | POA: Diagnosis not present

## 2014-01-14 DIAGNOSIS — J45909 Unspecified asthma, uncomplicated: Secondary | ICD-10-CM | POA: Diagnosis not present

## 2014-01-14 DIAGNOSIS — Z853 Personal history of malignant neoplasm of breast: Secondary | ICD-10-CM | POA: Insufficient documentation

## 2014-01-14 DIAGNOSIS — E119 Type 2 diabetes mellitus without complications: Secondary | ICD-10-CM | POA: Diagnosis not present

## 2014-01-14 DIAGNOSIS — R112 Nausea with vomiting, unspecified: Secondary | ICD-10-CM | POA: Insufficient documentation

## 2014-01-14 DIAGNOSIS — R000 Tachycardia, unspecified: Secondary | ICD-10-CM | POA: Diagnosis not present

## 2014-01-14 DIAGNOSIS — R05 Cough: Secondary | ICD-10-CM | POA: Diagnosis not present

## 2014-01-14 DIAGNOSIS — R5381 Other malaise: Secondary | ICD-10-CM | POA: Diagnosis not present

## 2014-01-14 DIAGNOSIS — R5383 Other fatigue: Secondary | ICD-10-CM | POA: Diagnosis not present

## 2014-01-14 LAB — CBC WITH DIFFERENTIAL/PLATELET
Basophils Absolute: 0 10*3/uL (ref 0.0–0.1)
Basophils Relative: 0 % (ref 0–1)
Eosinophils Absolute: 0.2 10*3/uL (ref 0.0–0.7)
Eosinophils Relative: 2 % (ref 0–5)
HCT: 38.6 % (ref 36.0–46.0)
Hemoglobin: 12.9 g/dL (ref 12.0–15.0)
Lymphocytes Relative: 11 % — ABNORMAL LOW (ref 12–46)
Lymphs Abs: 1.2 10*3/uL (ref 0.7–4.0)
MCH: 29.7 pg (ref 26.0–34.0)
MCHC: 33.4 g/dL (ref 30.0–36.0)
MCV: 88.7 fL (ref 78.0–100.0)
Monocytes Absolute: 0.5 10*3/uL (ref 0.1–1.0)
Monocytes Relative: 5 % (ref 3–12)
Neutro Abs: 9 10*3/uL — ABNORMAL HIGH (ref 1.7–7.7)
Neutrophils Relative %: 82 % — ABNORMAL HIGH (ref 43–77)
Platelets: 249 10*3/uL (ref 150–400)
RBC: 4.35 MIL/uL (ref 3.87–5.11)
RDW: 13.1 % (ref 11.5–15.5)
WBC: 11 10*3/uL — ABNORMAL HIGH (ref 4.0–10.5)

## 2014-01-14 LAB — BASIC METABOLIC PANEL
BUN: 17 mg/dL (ref 6–23)
CO2: 31 mEq/L (ref 19–32)
Calcium: 9.3 mg/dL (ref 8.4–10.5)
Chloride: 102 mEq/L (ref 96–112)
Creatinine, Ser: 0.73 mg/dL (ref 0.50–1.10)
GFR calc Af Amer: >90 mL/min (ref >90)
GFR calc non Af Amer: 83 mL/min — ABNORMAL LOW (ref >90)
Glucose, Bld: 228 mg/dL — ABNORMAL HIGH (ref 70–99)
Potassium: 4 mEq/L (ref 3.7–5.3)
Sodium: 143 mEq/L (ref 137–147)

## 2014-01-14 LAB — TROPONIN I: Troponin I: <0.3 ng/mL (ref <0.30)

## 2014-01-14 MED ORDER — PREDNISONE 50 MG PO TABS
60.0000 mg | ORAL_TABLET | Freq: Once | ORAL | Status: AC
  Administered 2014-01-14: 60 mg via ORAL
  Filled 2014-01-14 (×2): qty 1

## 2014-01-14 MED ORDER — IPRATROPIUM-ALBUTEROL 0.5-2.5 (3) MG/3ML IN SOLN: 3.0000 mL | Freq: Once | RESPIRATORY_TRACT | Status: DC

## 2014-01-14 MED ORDER — IPRATROPIUM-ALBUTEROL 0.5-2.5 (3) MG/3ML IN SOLN
3.0000 mL | RESPIRATORY_TRACT | Status: DC
  Administered 2014-01-14: 3 mL via RESPIRATORY_TRACT
  Filled 2014-01-14: qty 3

## 2014-01-14 NOTE — ED Notes (Signed)
Patient reports, "I feel like I'm being smothered."

## 2014-01-14 NOTE — ED Notes (Signed)
Patient complaining of shortness of breath. Reports "I think my allergies are bothering me. I feel like I can hardly breathe."

## 2014-01-14 NOTE — ED Provider Notes (Signed)
CSN: [id number]     Arrival date & time 01/14/14  2212 History   This chart was scribed for Babette Relic, MD by Jenne Campus, ED Scribe. This patient was seen in room APA18/APA18 and the patient's care was started at 10:25 PM.   Chief Complaint  Patient presents with  . Shortness of Breath  . Chest Pain     The history is provided by the patient. No language interpreter was used.    HPI Comments: Amy Hoffman is a 73 y.o. female with DM and a mild h/o asthma who presents to the Emergency Department complaining of 2 days of constant, gradual onset SOB with associated wheezing and nonproductive cough that became gradually worse within the past 2 hours after cleaning pollen off of her porch tonight. She has a h/o wheezing in the past with improvement with an inhaler. She has tried the inhaler last night without improvement, but she states that her symptoms have improved en route spontaneously. She states that she was recently seen by Dr. Harl Bowie for a possible HTN follow up but has been otherwise been healthy. She denies any h/o CAD or MI. She states that she has a h/o breast CA 22 years ago but denies any ongoing CA diagnoses. She denies any fever, confusion, syncope or emesis as associated symptoms.   Past Medical History  Diagnosis Date  . Cancer   . Diabetes mellitus without complication    History reviewed. No pertinent past surgical history. History reviewed. No pertinent family history. History  Substance Use Topics  . Smoking status: Never Smoker   . Smokeless tobacco: Not on file  . Alcohol Use: Not on file   No OB history provided.  Review of Systems  A 10 system review of systems was obtained and all systems are negative except as noted in the HPI and PMH.    Allergies  Review of patient's allergies indicates no known allergies.  Home Medications   Prior to Admission medications   Medication Sig Start Date End Date Taking? Authorizing Provider  anastrozole  (ARIMIDEX) 1 MG tablet Take 1 mg by mouth daily.    Historical Provider, MD  glimepiride (AMARYL) 4 MG tablet Take 4 mg by mouth 2 (two) times daily.    Historical Provider, MD  ibuprofen (ADVIL,MOTRIN) 200 MG tablet Take 200 mg by mouth every 6 (six) hours as needed for pain.    Historical Provider, MD  metFORMIN (GLUCOPHAGE) 500 MG tablet Take 500 mg by mouth 2 (two) times daily.    Historical Provider, MD  simvastatin (ZOCOR) 40 MG tablet Take 40 mg by mouth every evening.    Historical Provider, MD  venlafaxine XR (EFFEXOR-XR) 75 MG 24 hr capsule Take 75 mg by mouth daily.    Historical Provider, MD   Triage vitals: BP 202/103  Pulse 117  Temp(Src) 98.4 F (36.9 C) (Oral)  Resp 22  Ht 5\' 3"  (1.6 m)  Wt 168 lb (76.204 kg)  BMI 29.77 kg/m2  Physical Exam  Nursing note and vitals reviewed. Constitutional:  Awake, alert, nontoxic appearance.  HENT:  Head: Atraumatic.  Eyes: Right eye exhibits no discharge. Left eye exhibits no discharge.  Neck: Neck supple.  Cardiovascular: Regular rhythm.  Tachycardia present.   No murmur heard. Pulmonary/Chest: She is in respiratory distress (mild). She exhibits no tenderness.  Speaks full sentences. No retractions. No accessory muscle usage. Pulse ox is unknown. Few faint and expiratory wheezes.   Abdominal: Soft. Bowel sounds are normal.  There is no tenderness. There is no rebound.  Musculoskeletal: She exhibits no edema and no tenderness.  Baseline ROM, no obvious new focal weakness.  Neurological:  Mental status and motor strength appears baseline for patient and situation.  Skin: No rash noted.  Psychiatric: She has a normal mood and affect.    ED Course  Procedures (including critical care time)  DIAGNOSTIC STUDIES: Pulse oximetry normal on room air at 98%.   COORDINATION OF CARE: 10:32 PM- Patient / Family / Caregiver understand and agree with initial ED impression and plan with expectations set for ED visit.  0030- feels much  better does not want another breathing treatment at this time lungs still show some prolonged expiratory phase with some faint scattered expiratory wheezes but speech normal and unlabored. Await repeat troponin then anticipate discharge.  Care handoff to Dr. Roxanne Mins. 0030  Labs Review Labs Reviewed  CBC WITH DIFFERENTIAL - Abnormal; Notable for the following:    WBC 11.0 (*)    Neutrophils Relative % 82 (*)    Neutro Abs 9.0 (*)    Lymphocytes Relative 11 (*)    All other components within normal limits  BASIC METABOLIC PANEL - Abnormal; Notable for the following:    Glucose, Bld 228 (*)    GFR calc non Af Amer 83 (*)    All other components within normal limits  TROPONIN I  I-STAT TROPOININ, ED  I-STAT TROPOININ, ED    Imaging Review No results found.   EKG Interpretation   Date/Time:  Sunday Jan 14 2014 22:43:37 EDT Ventricular Rate:  107 PR Interval:  159 QRS Duration: 85 QT Interval:  327 QTC Calculation: 436 R Axis:   57 Text Interpretation:  Sinus tachycardia Minimal ST depression, inferior  leads No significant change since last tracing Confirmed by [facility]  MD,  Jenny Reichmann ([id number]) on 01/15/2014 12:20:48 AM     ECG Muse interface not working: Sinus tachycardia, ventricular rate 107, normal axis, slight nondiagnostic inferior ST depression, no significant change from prior ECG noted MDM   Final diagnoses:  Asthma attack  Diabetes mellitus with hyperglycemia  Nausea & vomiting   I personally performed the services described in this documentation, which was scribed in my presence. The recorded information has been reviewed and is accurate.  I doubt any other EMC precluding discharge at this time including, but not necessarily limited to the following:ACS, DKA.      Babette Relic, MD 01/17/14 [phone]

## 2014-01-15 LAB — I-STAT TROPONIN, ED
Troponin i, poc: 0 ng/mL (ref 0.00–0.08)
Troponin i, poc: 0 ng/mL (ref 0.00–0.08)

## 2014-01-15 MED ORDER — ONDANSETRON 8 MG PO TBDP
8.0000 mg | ORAL_TABLET | Freq: Once | ORAL | Status: AC
  Administered 2014-01-15: 8 mg via ORAL
  Filled 2014-01-15: qty 1

## 2014-01-15 MED ORDER — ONDANSETRON HCL 4 MG PO TABS
4.0000 mg | ORAL_TABLET | Freq: Four times a day (QID) | ORAL | Status: DC | PRN
Start: 2014-01-15 — End: 2014-01-30

## 2014-01-15 MED ORDER — PREDNISONE 20 MG PO TABS: ORAL_TABLET | ORAL | Status: DC

## 2014-01-15 NOTE — Discharge Instructions (Signed)
Until recheck you may use your albuterol inhaler 2 puffs every 2 hours as needed for wheezing.  Asthma, Acute Bronchospasm Acute bronchospasm caused by asthma is also referred to as an asthma attack. Bronchospasm means your air passages become narrowed. The narrowing is caused by inflammation and tightening of the muscles in the air tubes (bronchi) in your lungs. This can make it hard to breath or cause you to wheeze and cough. CAUSES Possible triggers are:  Animal dander from the skin, hair, or feathers of animals.  Dust mites contained in house dust.  Cockroaches.  Pollen from trees or grass.  Mold.  Cigarette or tobacco smoke.  Air pollutants such as dust, household cleaners, hair sprays, aerosol sprays, paint fumes, strong chemicals, or strong odors.  Cold air or weather changes. Cold air may trigger inflammation. Winds increase molds and pollens in the air.  Strong emotions such as crying or laughing hard.  Stress.  Certain medicines such as aspirin or beta-blockers.  Sulfites in foods and drinks, such as dried fruits and wine.  Infections or inflammatory conditions, such as a flu, cold, or inflammation of the nasal membranes (rhinitis).  Gastroesophageal reflux disease (GERD). GERD is a condition where stomach acid backs up into your throat (esophagus).  Exercise or strenuous activity. SIGNS AND SYMPTOMS   Wheezing.  Excessive coughing, particularly at night.  Chest tightness.  Shortness of breath. DIAGNOSIS  Your health care provider will ask you about your medical history and perform a physical exam. A chest X-ray or blood testing may be performed to look for other causes of your symptoms or other conditions that may have triggered your asthma attack. TREATMENT  Treatment is aimed at reducing inflammation and opening up the airways in your lungs. Most asthma attacks are treated with inhaled medicines. These include quick relief or rescue medicines (such as  bronchodilators) and controller medicines (such as inhaled corticosteroids). These medicines are sometimes given through an inhaler or a nebulizer. Systemic steroid medicine taken by mouth or given through an IV tube also can be used to reduce the inflammation when an attack is moderate or severe. Antibiotic medicines are only used if a bacterial infection is present.  HOME CARE INSTRUCTIONS   Rest.  Drink plenty of liquids. This helps the mucus to remain thin and be easily coughed up. Only use caffeine in moderation and do not use alcohol until you have recovered from your illness.  Do not smoke. Avoid being exposed to secondhand smoke.  You play a critical role in keeping yourself in good health. Avoid exposure to things that cause you to wheeze or to have breathing problems.  Keep your medicines up to date and available. Carefully follow your health care provider's treatment plan.  Take your medicine exactly as prescribed.  When pollen or pollution is bad, keep windows closed and use an air conditioner or go to places with air conditioning.  Asthma requires careful medical care. See your health care provider for a follow-up as advised. If you are more than [redacted] weeks pregnant and you were prescribed any new medicines, let your obstetrician know about the visit and how you are doing. Follow-up with your health care provider as directed.  After you have recovered from your asthma attack, make an appointment with your outpatient doctor to talk about ways to reduce the likelihood of future attacks. If you do not have a doctor who manages your asthma, make an appointment with a primary care doctor to discuss your asthma. SEEK  IMMEDIATE MEDICAL CARE IF:   You are getting worse.  You have trouble breathing. If severe, call your local emergency services (911 in the [place]  You develop chest pain or discomfort.  You are vomiting.  You are not able to keep fluids down.  You are coughing up  yellow, green, brown, or bloody sputum.  You have a fever and your symptoms suddenly get worse.  You have trouble swallowing. MAKE SURE YOU:   Understand these instructions.  Will watch your condition.  Will get help right away if you are not doing well or get worse. Document Released: 12/16/2006 Document Revised: 05/03/2013 Document Reviewed: 03/08/2013 [facility] Patient Information 2014 [place], [place].

## 2014-01-15 NOTE — ED Notes (Addendum)
Pt had a very large emesis with large food particles and large volume all over the floor, walls and cabinets.

## 2014-01-15 NOTE — ED Provider Notes (Signed)
Patient initially seen and evaluated by Dr. With exacerbation of asthma which responded well to nebulizer treatments in the ED. She was kept in the ED pending a repeat troponin which has come back normal. However, she did have an episode of nausea and vomiting while waiting for lab results and this improved with ondansetron ODT. She is discharged with prescription for prednisone per Dr. Annetta Maw and also given a prescription for ondansetron.  Delora Fuel, MD 88/28/00 [id number]

## 2014-01-30 ENCOUNTER — Ambulatory Visit (INDEPENDENT_AMBULATORY_CARE_PROVIDER_SITE_OTHER): Payer: Medicare Other | Admitting: Cardiology

## 2014-01-30 ENCOUNTER — Encounter: Payer: Self-pay | Admitting: Cardiology

## 2014-01-30 VITALS — BP 166/86 | HR 72 | Ht 63.0 in | Wt 156.0 lb

## 2014-01-30 DIAGNOSIS — C50919 Malignant neoplasm of unspecified site of unspecified female breast: Secondary | ICD-10-CM | POA: Diagnosis not present

## 2014-01-30 DIAGNOSIS — R0602 Shortness of breath: Secondary | ICD-10-CM | POA: Insufficient documentation

## 2014-01-30 DIAGNOSIS — R030 Elevated blood-pressure reading, without diagnosis of hypertension: Secondary | ICD-10-CM | POA: Diagnosis not present

## 2014-01-30 DIAGNOSIS — IMO0001 Reserved for inherently not codable concepts without codable children: Secondary | ICD-10-CM

## 2014-01-30 NOTE — Patient Instructions (Signed)
Your physician wants you to follow-up in: 6 months You will receive a reminder letter in the mail two months in advance. If you don't receive a letter, please call our office to schedule the follow-up appointment.   Please return in 3 weeks for BP check with nurse    Your physician recommends that you continue on your current medications as directed. Please refer to the Current Medication list given to you today.    Thank you for choosing [facility] !

## 2014-01-30 NOTE — Progress Notes (Signed)
Clinical Summary Ms. Jauregui is a 73 y.o.female seen today for follow up of the following medical problems.   1. Breast CA  - followed at [facility], hx of recurrent breast CA. Previous lumpectomy, radiaction, and chemo. Currently on arimidex.   2. SOB - recent ER visit 01/14/14, thought to be mild asthma exacerbation with SOB and cough, wheezing after cleaning pollen off porch.  - EKG sinus tach, CXR clear, trop neg x 2 -often  has these episodes in [place], similar presentation 01/2013 to [place]. - recent echo 12/2013 with normal LV systolic function, grade I diasotlic dysfunction  4. Elevated blood pressures - very elevated bp's during ER visit reported 200/100 - reports recent visit to [facility] for breast CA follow with elevated bp's - she has no prior history of HTN   Past Medical History  Diagnosis Date  . Cancer   . Diabetes mellitus without complication      No Known Allergies   Current Outpatient Prescriptions  Medication Sig Dispense Refill  . albuterol (PROVENTIL HFA;VENTOLIN HFA) 108 (90 BASE) MCG/ACT inhaler Inhale 1 puff into the lungs every 6 (six) hours as needed for wheezing or shortness of breath.      . anastrozole (ARIMIDEX) 1 MG tablet Take 1 mg by mouth daily.      Marland Kitchen aspirin EC 81 MG tablet Take 81 mg by mouth daily.      Marland Kitchen glimepiride (AMARYL) 4 MG tablet Take 4 mg by mouth every morning.       Marland Kitchen ibuprofen (ADVIL,MOTRIN) 200 MG tablet Take 200 mg by mouth daily as needed for mild pain or moderate pain.       . metFORMIN (GLUCOPHAGE) 500 MG tablet Take 1,000 mg by mouth 2 (two) times daily.       . ondansetron (ZOFRAN) 4 MG tablet Take 1 tablet (4 mg total) by mouth every 6 (six) hours as needed for nausea.  12 tablet  0  . oxymetazoline (NASAL SPRAY EXTRA MOISTURIZING) 0.05 % nasal spray Place 1 spray into both nostrils 2 (two) times daily as needed for congestion.      . predniSONE (DELTASONE) 20 MG tablet 2 tabs po daily x 4 days  8 tablet  0  .  simvastatin (ZOCOR) 40 MG tablet Take 40 mg by mouth every evening.      . venlafaxine XR (EFFEXOR-XR) 75 MG 24 hr capsule Take 75 mg by mouth daily.       No current facility-administered medications for this visit.     No past surgical history on file.   No Known Allergies    No family history on file.   Social History Ms. Seabrooks reports that she has never smoked. She does not have any smokeless tobacco history on file. Ms. Herbold has no alcohol history on file.   Review of Systems CONSTITUTIONAL: No weight loss, fever, chills, weakness or fatigue.  HEENT: Eyes: No visual loss, blurred vision, double vision or yellow sclerae.No hearing loss, sneezing, congestion, runny nose or sore throat.  SKIN: No rash or itching.  CARDIOVASCULAR: no chest pain, no palpitations RESPIRATORY: per HPI.  GASTROINTESTINAL: No anorexia, nausea, vomiting or diarrhea. No abdominal pain or blood.  GENITOURINARY: No burning on urination, no polyuria NEUROLOGICAL: No headache, dizziness, syncope, paralysis, ataxia, numbness or tingling in the extremities. No change in bowel or bladder control.  MUSCULOSKELETAL: No muscle, back pain, joint pain or stiffness.  LYMPHATICS: No enlarged nodes. No history of splenectomy.  PSYCHIATRIC: No history of depression or anxiety.  ENDOCRINOLOGIC: No reports of sweating, cold or heat intolerance. No polyuria or polydipsia.  Marland Kitchen   Physical Examination p 72 bp 170/70 Wt 156 lbs BMI 28 Gen: resting comfortably, no acute distress HEENT: no scleral icterus, pupils equal round and reactive, no palptable cervical adenopathy,  CV: RRR, no m/r/g, no JVD, no carotid bruits Resp: Clear to auscultation bilaterally GI: abdomen is soft, non-tender, non-distended, normal bowel sounds, no hepatosplenomegaly MSK: extremities are warm, no edema.  Skin: warm, no rash Neuro:  no focal deficits Psych: appropriate affect   Diagnostic Studies 01/10/14 Echo Study Conclusions - Left  ventricle: The cavity size was normal. Wall thickness was increased in a pattern of mild LVH. Systolic function was normal. The estimated ejection fraction was in the range of 50% to 55%. Wall motion was normal; there were no regional wall motion abnormalities. Doppler parameters are consistent with abnormal left ventricular relaxation (grade 1 diastolic dysfunction). Doppler parameters are consistent with elevated mean left atrial filling pressure. - Left atrium: The atrium was mildly dilated. - Tricuspid valve: Mild regurgitation. - Pulmonary arteries: PA peak pressure: 62mm Hg (S). Mildly elevated pulmonary pressures.  01/14/14 CXR FINDINGS: Sirs elevation of the right diaphragm. There is no focal parenchymal opacity, pleural effusion, or pneumothorax. The heart and mediastinal contours are unremarkable.  There are surgical clips in the right axilla.  The osseous structures are unremarkable.  IMPRESSION: No active cardiopulmonary disease.      Assessment and Plan  1. SOB - recent episode appears to be related to seasonal allergies - recent echo with overall normal LV systolic function, grade I diastolic dysfunction. Follow symptoms as her allergy flare resolves  2. Elevated bp - no prior hx of HTN - recently noted to have elevated bp's in ER, [facility], and our clinic. She feels its likely related to her ongoing allergy flare. She denies any decongestant use. She is hesitant to start bp meds today, will have her come back for bp check in 3 weeks after time for symptoms to completely resolve, if bp remains elevated start lisinopril in the setting of HTN and DM   F/u 6. Months. BP check in 3 weeks      Arnoldo Lenis, M.D., F.A.C.C.

## 2014-02-20 ENCOUNTER — Encounter: Payer: Self-pay | Admitting: *Deleted

## 2014-03-19 ENCOUNTER — Other Ambulatory Visit (HOSPITAL_COMMUNITY): Payer: Self-pay | Admitting: Pulmonary Disease

## 2014-03-19 DIAGNOSIS — M542 Cervicalgia: Secondary | ICD-10-CM

## 2014-03-19 DIAGNOSIS — R200 Anesthesia of skin: Secondary | ICD-10-CM

## 2014-03-19 DIAGNOSIS — N390 Urinary tract infection, site not specified: Secondary | ICD-10-CM | POA: Diagnosis not present

## 2014-03-19 DIAGNOSIS — E109 Type 1 diabetes mellitus without complications: Secondary | ICD-10-CM | POA: Diagnosis not present

## 2014-03-19 DIAGNOSIS — E785 Hyperlipidemia, unspecified: Secondary | ICD-10-CM | POA: Diagnosis not present

## 2014-03-22 ENCOUNTER — Ambulatory Visit (HOSPITAL_COMMUNITY)
Admission: RE | Admit: 2014-03-22 | Discharge: 2014-03-22 | Disposition: A | Discharge status: Home / Self Care | Payer: Medicare Other | Source: Ambulatory Visit | Attending: Pulmonary Disease | Admitting: Pulmonary Disease

## 2014-03-22 ENCOUNTER — Encounter (HOSPITAL_COMMUNITY): Payer: Self-pay

## 2014-03-22 DIAGNOSIS — M47812 Spondylosis without myelopathy or radiculopathy, cervical region: Secondary | ICD-10-CM | POA: Insufficient documentation

## 2014-03-22 DIAGNOSIS — M542 Cervicalgia: Secondary | ICD-10-CM | POA: Diagnosis not present

## 2014-03-22 DIAGNOSIS — M79609 Pain in unspecified limb: Secondary | ICD-10-CM | POA: Diagnosis not present

## 2014-03-22 DIAGNOSIS — R200 Anesthesia of skin: Secondary | ICD-10-CM

## 2014-04-17 ENCOUNTER — Ambulatory Visit (HOSPITAL_COMMUNITY)
Admission: RE | Admit: 2014-04-17 | Discharge: 2014-04-17 | Disposition: A | Discharge status: Home / Self Care | Payer: Medicare Other | Source: Ambulatory Visit | Attending: Pulmonary Disease | Admitting: Pulmonary Disease

## 2014-04-17 DIAGNOSIS — R209 Unspecified disturbances of skin sensation: Secondary | ICD-10-CM | POA: Diagnosis not present

## 2014-04-17 DIAGNOSIS — M542 Cervicalgia: Secondary | ICD-10-CM | POA: Insufficient documentation

## 2014-04-17 DIAGNOSIS — IMO0001 Reserved for inherently not codable concepts without codable children: Secondary | ICD-10-CM | POA: Insufficient documentation

## 2014-04-17 DIAGNOSIS — G8929 Other chronic pain: Secondary | ICD-10-CM | POA: Insufficient documentation

## 2014-04-17 DIAGNOSIS — R202 Paresthesia of skin: Secondary | ICD-10-CM

## 2014-04-17 DIAGNOSIS — R200 Anesthesia of skin: Secondary | ICD-10-CM | POA: Insufficient documentation

## 2014-04-17 NOTE — Evaluation (Signed)
Physical Therapy Evaluation  Patient Details  Name: Amy Hoffman MRN: [id number] Date of Birth: 1941/08/29  Today's Date: 04/17/2014 Time: 0940-1015 PT Time Calculation (min): 35 min Charge:  Evaluation              Visit#: 1 of 12  Re-eval: 05/17/14 Assessment Diagnosis: cervical bone spurs  Prior Therapy: three years ago   Authorization: medicare    Past Medical History:  Past Medical History  Diagnosis Date  . Cancer   . Diabetes mellitus without complication    Past Surgical History: No past surgical history on file.  Subjective Symptoms/Limitations Symptoms: Amy Hoffman is a 73 yo female with chronic neck pain.   She has been  having numbess B UE,( Rt greater than Lt), including her hand.  She has noticed decreased strength and increased sensitivity.  Pertinent History: Fatty tumor of Lt bicipatal mm.   How long can you sit comfortably?: no change of sx How long can you stand comfortably?: no chance of sx  How long can you walk comfortably?: no change of sx  Pain Assessment Currently in Pain?: No/denies     Balance Screening Balance Screen Has the patient fallen in the past 6 months: No    Sensation/Coordination/Flexibility/Functional Tests Functional Tests Functional Tests: foto 48  Assessment RUE Strength RUE Overall Strength: Within Functional Limits for tasks performed (except for ER with is 3/5) LUE Strength LUE Overall Strength: Within Functional Limits for tasks assessed (except for ER 3-/5) Cervical AROM Cervical Flexion: wnl reps no change Cervical Extension: wnl reps no change Cervical - Right Side Bend: decreased 10% reps increased pain Cervical - Left Side Bend: wnl reps improved   Cervical - Right Rotation: wfl Cervical - Left Rotation: wfl  Cervical Strength Cervical Extension: 5/5 Cervical - Right Side Bend: 5/5 Cervical - Left Side Bend: 5/5 Palpation Palpation: + mm spasm  Exercise/Treatments Mobility/Balance  Posture/Postural  Control Posture/Postural Control: Postural limitations Postural Limitations: forward head, increased kyphosis   Seated Exercises Lateral Flexion: Left;5 reps Other Seated Exercise: scapular retraction; cervical extension x 10 @ Supine Exercises Other Supine Exercise: decompression exercises 1-5    Physical Therapy Assessment and Plan PT Assessment and Plan Clinical Impression Statement: Pt is a 73 yo female who's main complaint is numbness and tingiling in B UE.  Pt exam demonstrates mm spasm and poor posture.  PT will benefit from skilled theapy to improve posture and spasm to decrease sx and improve her quality of life.  Pt will benefit from skilled therapeutic intervention in order to improve on the following deficits: Decreased activity tolerance;Increased muscle spasms;Increased fascial restricitons;Improper spinal/pelvic alignment Rehab Potential: Good PT Frequency: Min 3X/week PT Duration: 4 weeks PT Treatment/Interventions: Modalities;Manual techniques;Therapeutic exercise PT Plan: begin decompression ex with t-band trial of cervical distraction; manual for mm spasm, postural exercises and education     Goals Home Exercise Program Pt/caregiver will Perform Home Exercise Program: For increased ROM PT Goal: Perform Home Exercise Program - Progress: Goal set today PT Short Term Goals Time to Complete Short Term Goals: 2 weeks PT Short Term Goal 1: Pt radicular sx to elbow only PT Short Term Goal 2: Pt to feel comfortable lifting ten# PT Long Term Goals Time to Complete Long Term Goals: 4 weeks PT Long Term Goal 1: I in advance HEP PT Long Term Goal 2: Pt to have not radicular sx Long Term Goal 3: Pt to be able to lift grocery bag and carry it into her house.  Problem List Patient Active Problem List   Diagnosis Date Noted  . Numbness and tingling in hands 04/17/2014  . SOB (shortness of breath) 01/30/2014  . Breast CA 01/30/2014  . Elevated blood pressure 01/30/2014     PT Plan of Care PT Home Exercise Plan: given  GP Functional Assessment Tool Used: foto Functional Limitation: Other PT primary Other PT Primary Current Status (X6553): At least 40 percent but less than 60 percent impaired, limited or restricted Other PT Primary Goal Status (Z4827): At least 20 percent but less than 40 percent impaired, limited or restricted  RUSSELL,CINDY 04/17/2014, 5:41 PM  Physician Documentation Your signature is required to indicate approval of the treatment plan as stated above.  Please sign and either send electronically or make a copy of this report for your files and return this physician signed original.   Please mark one 1.__approve of plan  2. ___approve of plan with the following conditions.   ______________________________                                                          _____________________ Physician Signature                                                                                                             Date

## 2014-04-19 ENCOUNTER — Ambulatory Visit (HOSPITAL_COMMUNITY)
Admission: RE | Admit: 2014-04-19 | Discharge: 2014-04-19 | Disposition: A | Discharge status: Home / Self Care | Payer: Medicare Other | Source: Ambulatory Visit | Attending: Pulmonary Disease | Admitting: Pulmonary Disease

## 2014-04-19 DIAGNOSIS — IMO0001 Reserved for inherently not codable concepts without codable children: Secondary | ICD-10-CM | POA: Diagnosis not present

## 2014-04-19 DIAGNOSIS — G8929 Other chronic pain: Secondary | ICD-10-CM | POA: Diagnosis not present

## 2014-04-19 DIAGNOSIS — R202 Paresthesia of skin: Secondary | ICD-10-CM

## 2014-04-19 DIAGNOSIS — R209 Unspecified disturbances of skin sensation: Secondary | ICD-10-CM | POA: Diagnosis not present

## 2014-04-19 DIAGNOSIS — M542 Cervicalgia: Secondary | ICD-10-CM | POA: Diagnosis not present

## 2014-04-19 NOTE — Progress Notes (Signed)
Physical Therapy Treatment Patient Details  Name: Amy Hoffman MRN: [id number] Date of Birth: 1941-07-11  Today's Date: 04/19/2014 Time: 2440-1027 PT Time Calculation (min): 42 min     Charges: manual [phone], TherEx 900-930 Visit#: 2 of 12  Re-eval: 05/17/14 Assessment Diagnosis: cervical bone spurs  Prior Therapy: three years ago   Authorization: medicare   Subjective: Symptoms/Limitations Symptoms: Patient notes neck pain ths session only during palpation, primary complain on Rt hand numbness and tingling and pain Pertinent History: Fatty tumor of Lt bicipatal mm.   Pain Assessment Currently in Pain?: Yes Pain Score: 5  Pain Location: Arm Pain Orientation: Right Pain Type: Chronic pain Pain Onset: More than a month ago  Exercise/Treatments Stretches Upper Trapezius Stretch: 5 reps;10 seconds Chest Stretch: Limitations Chest Stretch Limitations: 3way 5x 10 seconds Standing Exercises Neck Retraction: 20 reps;3 secs Seated Exercises Lateral Flexion: Left;10 reps Other Seated Exercise: scapular retraction; cervical extension x 10 @  Manual Therapy Manual Therapy: Myofascial release Joint Mobilization: Cervical spine up glides bilaterally resultign in decreased Rt UE pain with Rt sided up glides, increased pain noted during down glides Myofascial Release: upper trapezius and scalenes  Physical Therapy Assessment and Plan PT Assessment and Plan Clinical Impression Statement: Patient demosntrates improvign symptoms this session noting that she felt better after last session and that she feels the stretches are helping improve her moblity and she does not feel as stiff at the end of the session. Patient noted relief during bilateral up-glides of cervical spine particularly on Rt. followign therEx patient noted decreased pain.  PT Plan: begin decompression ex with t-band trial of cervical distraction; manual for mm spasm, postural exercises and education for HEP     Goals PT Short Term Goals PT Short Term Goal 1: Pt radicular sx to elbow only PT Short Term Goal 1 - Progress: Progressing toward goal PT Short Term Goal 2: Pt to feel comfortable lifting ten# PT Short Term Goal 2 - Progress: Progressing toward goal PT Long Term Goals PT Long Term Goal 1: I in advance HEP PT Long Term Goal 1 - Progress: Progressing toward goal PT Long Term Goal 2: Pt to have not radicular sx PT Long Term Goal 2 - Progress: Progressing toward goal Long Term Goal 3: Pt to be able to lift grocery bag and carry it into her house.  Long Term Goal 3 Progress: Progressing toward goal  Problem List Patient Active Problem List   Diagnosis Date Noted  . Numbness and tingling in hands 04/17/2014  . SOB (shortness of breath) 01/30/2014  . Breast CA 01/30/2014  . Elevated blood pressure 01/30/2014    PT Plan of Care PT Home Exercise Plan: given  GP    Aslee Such R 04/19/2014, 9:31 AM

## 2014-04-27 ENCOUNTER — Ambulatory Visit (HOSPITAL_COMMUNITY): Payer: Medicare Other | Admitting: Physical Therapy

## 2014-04-27 ENCOUNTER — Ambulatory Visit (INDEPENDENT_AMBULATORY_CARE_PROVIDER_SITE_OTHER): Payer: Medicare Other | Admitting: Urology

## 2014-04-27 DIAGNOSIS — N813 Complete uterovaginal prolapse: Secondary | ICD-10-CM | POA: Diagnosis not present

## 2014-04-27 DIAGNOSIS — N952 Postmenopausal atrophic vaginitis: Secondary | ICD-10-CM

## 2014-05-01 ENCOUNTER — Inpatient Hospital Stay (HOSPITAL_COMMUNITY): Admission: RE | Admit: 2014-05-01 | Payer: Medicare Other | Source: Ambulatory Visit | Admitting: Physical Therapy

## 2014-05-02 ENCOUNTER — Inpatient Hospital Stay (HOSPITAL_COMMUNITY): Admission: RE | Admit: 2014-05-02 | Payer: Medicare Other | Source: Ambulatory Visit

## 2014-05-02 DIAGNOSIS — N814 Uterovaginal prolapse, unspecified: Secondary | ICD-10-CM | POA: Diagnosis not present

## 2014-05-04 ENCOUNTER — Inpatient Hospital Stay (HOSPITAL_COMMUNITY): Admission: RE | Admit: 2014-05-04 | Payer: Medicare Other | Source: Ambulatory Visit | Admitting: Physical Therapy

## 2014-05-07 ENCOUNTER — Ambulatory Visit (HOSPITAL_COMMUNITY)
Admission: RE | Admit: 2014-05-07 | Discharge: 2014-05-07 | Disposition: A | Discharge status: Home / Self Care | Payer: Medicare Other | Source: Ambulatory Visit | Attending: Pulmonary Disease | Admitting: Pulmonary Disease

## 2014-05-07 ENCOUNTER — Ambulatory Visit (HOSPITAL_COMMUNITY): Payer: Medicare Other

## 2014-05-07 DIAGNOSIS — M542 Cervicalgia: Secondary | ICD-10-CM | POA: Diagnosis not present

## 2014-05-07 DIAGNOSIS — R202 Paresthesia of skin: Secondary | ICD-10-CM

## 2014-05-07 DIAGNOSIS — IMO0001 Reserved for inherently not codable concepts without codable children: Secondary | ICD-10-CM | POA: Diagnosis not present

## 2014-05-07 DIAGNOSIS — G8929 Other chronic pain: Secondary | ICD-10-CM | POA: Diagnosis not present

## 2014-05-07 DIAGNOSIS — R209 Unspecified disturbances of skin sensation: Secondary | ICD-10-CM | POA: Diagnosis not present

## 2014-05-07 NOTE — Progress Notes (Signed)
Physical Therapy Treatment Patient Details  Name: Amy Hoffman MRN: [id number] Date of Birth: [birth year]  Today's Date: 05/07/2014 Time: 9741-6384 PT Time Calculation (min): 40 min Charge: TE [id number]  Visit#: 3 of 12  Re-eval: 05/17/14 Assessment Diagnosis: cervical bone spurs  Prior Therapy: three years ago   Authorization: medicare  Authorization Time Period:    Authorization Visit#: 3 of 10   Subjective: Symptoms/Limitations Symptoms: Pt reported a weekend with family members in [place], able to relax and currently pain free.  Has misseed a couple of sessoin due to MD apts. Pain Assessment Currently in Pain?: No/denies  Objective:   Exercise/Treatments Stretches Upper Trapezius Stretch: 5 reps;10 seconds Standing Exercises Neck Retraction: 20 reps;3 secs Seated Exercises Cervical Rotation: Both;10 reps Lateral Flexion: Left;10 reps;Limitations Lateral Flexion Limitations: with UE distraction to reduce shoulder elevation with lateral flexion Other Seated Exercise: scapular retraction; cervical extension x 10 Supine Exercises Other Supine Exercise: decompression with UE movement red tband 10x 3" each    Physical Therapy Assessment and Plan PT Assessment and Plan Clinical Impression Statement: Began decompression exercises wtih UE movement and theraband resistnace per PT plan for spinal decompressoin to assist with pain control with verbal, visual and demonstration cueing for proper technique.  Pt required multimodal cueing with posture awareness this session.  Tactile and visual cueing most beneficial to improve form.  No reports of pain through session.   PT Plan: Continue with decompressoin exercise with tband for reassurance with correct form and technique; Progress postural exercises, education with HEP and manual for mm spasms.    Goals PT Short Term Goals PT Short Term Goal 1: Pt radicular sx to elbow only PT Short Term Goal 1 - Progress: Progressing toward  goal PT Short Term Goal 2: Pt to feel comfortable lifting ten# PT Short Term Goal 2 - Progress: Progressing toward goal PT Long Term Goals PT Long Term Goal 1: I in advance HEP PT Long Term Goal 1 - Progress: Progressing toward goal PT Long Term Goal 2: Pt to have not radicular sx Long Term Goal 3: Pt to be able to lift grocery bag and carry it into her house.   Problem List Patient Active Problem List   Diagnosis Date Noted  . Numbness and tingling in hands 04/17/2014  . SOB (shortness of breath) 01/30/2014  . Breast CA 01/30/2014  . Elevated blood pressure 01/30/2014    PT Plan of Care PT Home Exercise Plan: Decompression with red tband for cervical distractoin.    GP    Aldona Lento 05/07/2014, 11:58 AM

## 2014-05-09 ENCOUNTER — Ambulatory Visit (HOSPITAL_COMMUNITY): Payer: Medicare Other

## 2014-05-09 ENCOUNTER — Ambulatory Visit (HOSPITAL_COMMUNITY)
Admission: RE | Admit: 2014-05-09 | Payer: Medicare Other | Source: Ambulatory Visit | Attending: Pulmonary Disease | Admitting: Pulmonary Disease

## 2014-05-11 ENCOUNTER — Ambulatory Visit (HOSPITAL_COMMUNITY)
Admission: RE | Admit: 2014-05-11 | Payer: Medicare Other | Source: Ambulatory Visit | Attending: Pulmonary Disease | Admitting: Pulmonary Disease

## 2014-05-11 ENCOUNTER — Ambulatory Visit (HOSPITAL_COMMUNITY): Payer: Medicare Other | Admitting: Physical Therapy

## 2014-05-14 ENCOUNTER — Ambulatory Visit (HOSPITAL_COMMUNITY)
Admission: RE | Admit: 2014-05-14 | Payer: Medicare Other | Source: Ambulatory Visit | Attending: Pulmonary Disease | Admitting: Pulmonary Disease

## 2014-05-14 ENCOUNTER — Ambulatory Visit (HOSPITAL_COMMUNITY): Payer: Medicare Other

## 2014-05-16 ENCOUNTER — Ambulatory Visit (HOSPITAL_COMMUNITY): Payer: Medicare Other | Admitting: Physical Therapy

## 2014-05-18 ENCOUNTER — Ambulatory Visit (HOSPITAL_COMMUNITY): Payer: Medicare Other

## 2014-05-18 ENCOUNTER — Ambulatory Visit (HOSPITAL_COMMUNITY)
Admission: RE | Admit: 2014-05-18 | Payer: Medicare Other | Source: Ambulatory Visit | Attending: Pulmonary Disease | Admitting: Pulmonary Disease

## 2014-07-14 ENCOUNTER — Encounter (HOSPITAL_COMMUNITY): Payer: Self-pay | Admitting: Emergency Medicine

## 2014-07-14 ENCOUNTER — Emergency Department (HOSPITAL_COMMUNITY): Payer: Medicare Other

## 2014-07-14 ENCOUNTER — Emergency Department (HOSPITAL_COMMUNITY)
Admission: EM | Admit: 2014-07-14 | Discharge: 2014-07-14 | Disposition: A | Discharge status: Home / Self Care | Payer: Medicare Other | Attending: Emergency Medicine | Admitting: Emergency Medicine

## 2014-07-14 DIAGNOSIS — Z79899 Other long term (current) drug therapy: Secondary | ICD-10-CM | POA: Diagnosis not present

## 2014-07-14 DIAGNOSIS — J209 Acute bronchitis, unspecified: Secondary | ICD-10-CM | POA: Diagnosis not present

## 2014-07-14 DIAGNOSIS — J40 Bronchitis, not specified as acute or chronic: Secondary | ICD-10-CM | POA: Diagnosis not present

## 2014-07-14 DIAGNOSIS — R05 Cough: Secondary | ICD-10-CM | POA: Diagnosis present

## 2014-07-14 DIAGNOSIS — R0602 Shortness of breath: Secondary | ICD-10-CM | POA: Diagnosis not present

## 2014-07-14 DIAGNOSIS — R0989 Other specified symptoms and signs involving the circulatory and respiratory systems: Secondary | ICD-10-CM | POA: Diagnosis not present

## 2014-07-14 DIAGNOSIS — Z853 Personal history of malignant neoplasm of breast: Secondary | ICD-10-CM | POA: Insufficient documentation

## 2014-07-14 DIAGNOSIS — Z7982 Long term (current) use of aspirin: Secondary | ICD-10-CM | POA: Diagnosis not present

## 2014-07-14 DIAGNOSIS — R059 Cough, unspecified: Secondary | ICD-10-CM

## 2014-07-14 DIAGNOSIS — E119 Type 2 diabetes mellitus without complications: Secondary | ICD-10-CM | POA: Insufficient documentation

## 2014-07-14 LAB — CBG MONITORING, ED: Glucose-Capillary: 259 mg/dL — ABNORMAL HIGH (ref 70–99)

## 2014-07-14 MED ORDER — AZITHROMYCIN 250 MG PO TABS
500.0000 mg | ORAL_TABLET | Freq: Once | ORAL | Status: AC
  Administered 2014-07-14: 500 mg via ORAL
  Filled 2014-07-14: qty 2

## 2014-07-14 MED ORDER — AZITHROMYCIN 250 MG PO TABS
250.0000 mg | ORAL_TABLET | Freq: Every day | ORAL | Status: DC
Start: 2014-07-14 — End: 2015-06-04

## 2014-07-14 NOTE — ED Provider Notes (Signed)
CSN: [id number]     Arrival date & time 07/14/14  1851 History   First MD Initiated Contact with Patient 07/14/14 1906     This chart was scribed for Maudry Diego, MD by Forrestine Him, ED Scribe. This patient was seen in room APA11/APA11 and the patient's care was started 7:08 PM.   Chief Complaint  Patient presents with  . Cough   Patient is a 73 y.o. female presenting with cough. The history is provided by the patient. No language interpreter was used.  Cough Severity:  Moderate Duration:  1 week Timing:  Constant Progression:  Unchanged Chronicity:  New Relieved by:  Nothing Worsened by:  Nothing tried Ineffective treatments:  Cough suppressants Associated symptoms: shortness of breath   Associated symptoms: no chest pain, no eye discharge, no headaches and no rash   Shortness of breath:    Severity:  Mild   Timing:  Sporadic   Progression:  Unchanged   HPI Comments: Amy Hoffman is a 73 y.o. female with a PMHx of DM and breast cancer who presents to the Emergency Department complaining of a constant, moderate cough x 1 week. Pt also reports congestion and SOB she associates with her cough. She as tried OTC Zyrtec and cough medication without any improvement for symptoms. Pt denies any sore throat, fever, or chills. Amy Hoffman has not yet received her Flu shot this season. No known allergies to medications. No other concerns this visit.  Past Medical History  Diagnosis Date  . Diabetes mellitus without complication   . Cancer     breast cancer - right   Past Surgical History  Procedure Laterality Date  . Mastectomy Right    History reviewed. No pertinent family history. History  Substance Use Topics  . Smoking status: Never Smoker   . Smokeless tobacco: Not on file  . Alcohol Use: No   OB History   Grav Para Term Preterm Abortions TAB SAB Ect Mult Living                 Review of Systems  Constitutional: Negative for appetite change and fatigue.  HENT:  Positive for congestion. Negative for ear discharge and sinus pressure.   Eyes: Negative for discharge.  Respiratory: Positive for cough and shortness of breath.   Cardiovascular: Negative for chest pain.  Gastrointestinal: Negative for abdominal pain and diarrhea.  Genitourinary: Negative for frequency and hematuria.  Musculoskeletal: Negative for back pain.  Skin: Negative for rash.  Neurological: Negative for seizures and headaches.  Psychiatric/Behavioral: Negative for hallucinations.      Allergies  Review of patient's allergies indicates no known allergies.  Home Medications   Prior to Admission medications   Medication Sig Start Date End Date Taking? Authorizing Provider  albuterol (PROVENTIL HFA;VENTOLIN HFA) 108 (90 BASE) MCG/ACT inhaler Inhale 1 puff into the lungs every 6 (six) hours as needed for wheezing or shortness of breath.    Historical Provider, MD  anastrozole (ARIMIDEX) 1 MG tablet Take 1 mg by mouth daily.    Historical Provider, MD  aspirin EC 81 MG tablet Take 81 mg by mouth daily.    Historical Provider, MD  glimepiride (AMARYL) 4 MG tablet Take 4 mg by mouth every morning.     Historical Provider, MD  ibuprofen (ADVIL,MOTRIN) 200 MG tablet Take 200 mg by mouth daily as needed for mild pain or moderate pain.     Historical Provider, MD  metFORMIN (GLUCOPHAGE) 500 MG tablet Take 1,000 mg  by mouth 2 (two) times daily.     Historical Provider, MD  oxymetazoline (NASAL SPRAY EXTRA MOISTURIZING) 0.05 % nasal spray Place 1 spray into both nostrils 2 (two) times daily as needed for congestion.    Historical Provider, MD  simvastatin (ZOCOR) 40 MG tablet Take 40 mg by mouth every evening.    Historical Provider, MD  venlafaxine XR (EFFEXOR-XR) 75 MG 24 hr capsule Take 75 mg by mouth daily.    Historical Provider, MD   Triage Vitals: BP 179/79  Pulse 107  Temp(Src) 100 F (37.8 C) (Oral)  Resp 20  Ht 5\' 4"  (1.626 m)  Wt 170 lb (77.111 kg)  BMI 29.17 kg/m2  SpO2  98%   Physical Exam  Constitutional: She is oriented to person, place, and time. She appears well-developed.  HENT:  Head: Normocephalic.  Eyes: Conjunctivae and EOM are normal. No scleral icterus.  Neck: Neck supple. No thyromegaly present.  Cardiovascular: Normal rate and regular rhythm.  Exam reveals no gallop and no friction rub.   No murmur heard. Pulmonary/Chest: No stridor. She has no wheezes. She has no rales. She exhibits no tenderness.  Abdominal: She exhibits no distension. There is no tenderness. There is no rebound.  Musculoskeletal: Normal range of motion. She exhibits no edema.  Lymphadenopathy:    She has no cervical adenopathy.  Neurological: She is oriented to person, place, and time. She exhibits normal muscle tone. Coordination normal.  Skin: No rash noted. No erythema.  Psychiatric: She has a normal mood and affect. Her behavior is normal.    ED Course  Procedures (including critical care time)  DIAGNOSTIC STUDIES: Oxygen Saturation is 98% on RA, Normal by my interpretation.    COORDINATION OF CARE: 7:07 PM- Will order DG chest 2 view. Discussed treatment plan with pt at bedside and pt agreed to plan.     Labs Review Labs Reviewed - No data to display  Imaging Review No results found.   EKG Interpretation None      MDM   Final diagnoses:  Cough    The chart was scribed for me under my direct supervision.  I personally performed the history, physical, and medical decision making and all procedures in the evaluation of this patient..   I personally performed the services described in this documentation, which was scribed in my presence. The recorded information has been reviewed and is accurate.    Maudry Diego, MD 07/14/14 2032

## 2014-07-14 NOTE — ED Notes (Signed)
Cough and congestion x 1 week w/some laryngitis.  SOB w/coughing. Denies fever, chills, vomited x 1 with coughing.

## 2014-07-14 NOTE — Discharge Instructions (Signed)
Follow up with your md next week if not improving °

## 2014-07-17 DIAGNOSIS — J209 Acute bronchitis, unspecified: Secondary | ICD-10-CM | POA: Diagnosis not present

## 2014-07-23 DIAGNOSIS — E1165 Type 2 diabetes mellitus with hyperglycemia: Secondary | ICD-10-CM | POA: Diagnosis not present

## 2014-07-23 DIAGNOSIS — J209 Acute bronchitis, unspecified: Secondary | ICD-10-CM | POA: Diagnosis not present

## 2014-09-03 DIAGNOSIS — E1165 Type 2 diabetes mellitus with hyperglycemia: Secondary | ICD-10-CM | POA: Diagnosis not present

## 2014-09-03 DIAGNOSIS — J029 Acute pharyngitis, unspecified: Secondary | ICD-10-CM | POA: Diagnosis not present

## 2014-09-05 ENCOUNTER — Encounter (HOSPITAL_COMMUNITY): Payer: Self-pay | Admitting: *Deleted

## 2014-09-05 ENCOUNTER — Emergency Department (HOSPITAL_COMMUNITY): Payer: Medicare Other

## 2014-09-05 ENCOUNTER — Emergency Department (HOSPITAL_COMMUNITY)
Admission: EM | Admit: 2014-09-05 | Discharge: 2014-09-06 | Disposition: A | Discharge status: Home / Self Care | Payer: Medicare Other | Attending: Emergency Medicine | Admitting: Emergency Medicine

## 2014-09-05 DIAGNOSIS — Z853 Personal history of malignant neoplasm of breast: Secondary | ICD-10-CM | POA: Insufficient documentation

## 2014-09-05 DIAGNOSIS — J209 Acute bronchitis, unspecified: Secondary | ICD-10-CM | POA: Insufficient documentation

## 2014-09-05 DIAGNOSIS — Z7982 Long term (current) use of aspirin: Secondary | ICD-10-CM | POA: Diagnosis not present

## 2014-09-05 DIAGNOSIS — E119 Type 2 diabetes mellitus without complications: Secondary | ICD-10-CM | POA: Insufficient documentation

## 2014-09-05 DIAGNOSIS — Z792 Long term (current) use of antibiotics: Secondary | ICD-10-CM | POA: Insufficient documentation

## 2014-09-05 DIAGNOSIS — R059 Cough, unspecified: Secondary | ICD-10-CM

## 2014-09-05 DIAGNOSIS — Z79899 Other long term (current) drug therapy: Secondary | ICD-10-CM | POA: Diagnosis not present

## 2014-09-05 DIAGNOSIS — R05 Cough: Secondary | ICD-10-CM | POA: Diagnosis not present

## 2014-09-05 DIAGNOSIS — R0602 Shortness of breath: Secondary | ICD-10-CM | POA: Diagnosis not present

## 2014-09-05 MED ORDER — IPRATROPIUM-ALBUTEROL 0.5-2.5 (3) MG/3ML IN SOLN
3.0000 mL | Freq: Once | RESPIRATORY_TRACT | Status: AC
  Administered 2014-09-05: 3 mL via RESPIRATORY_TRACT
  Filled 2014-09-05: qty 3

## 2014-09-05 NOTE — ED Notes (Signed)
Pt with productive cough and sore throat for 2 days

## 2014-09-05 NOTE — ED Notes (Signed)
Dr. Glick at bedside.  

## 2014-09-05 NOTE — ED Provider Notes (Signed)
CSN: [id number]     Arrival date & time 09/05/14  2131 History  This chart was scribed for Delora Fuel, MD by Einar Pheasant, ED Scribe. This patient was seen in room APA12/APA12 and the patient's care was started at 11:25 PM.     Chief Complaint  Patient presents with  . Cough   The history is provided by the patient. No language interpreter was used.    HPI Comments: Amy Hoffman is a 73 y.o. female with a PMhx of DM without complications and right breast CA presents to the Emergency Department complaining of gradual onset SOB which started approximately 3-4 days ago. She is also complaining of an associated productive cough with yellow sputum. Positive diaphoresis at home and intermittent chest pain which she describes as a pressure. She endorses taking Mucinex with temporary relief. Pt states that the last usage of her albuterol inhaler was 2 weeks ago. Denies any fever, chills, abdominal pain, nausea, emesis, tobacco usage, or HA.    Past Medical History  Diagnosis Date  . Diabetes mellitus without complication   . Cancer     breast cancer - right   Past Surgical History  Procedure Laterality Date  . Mastectomy Right    History reviewed. No pertinent family history. History  Substance Use Topics  . Smoking status: Never Smoker   . Smokeless tobacco: Not on file  . Alcohol Use: No   OB History    No data available     Review of Systems  Constitutional: Positive for diaphoresis. Negative for fever and chills.  Respiratory: Positive for shortness of breath.   Cardiovascular: Positive for chest pain. Negative for leg swelling.  Gastrointestinal: Negative for nausea, vomiting and abdominal pain.  All other systems reviewed and are negative.  Lungs diffuse exp wheezes   Allergies  Review of patient's allergies indicates no known allergies.  Home Medications   Prior to Admission medications   Medication Sig Start Date End Date Taking? Authorizing Provider   albuterol (PROVENTIL HFA;VENTOLIN HFA) 108 (90 BASE) MCG/ACT inhaler Inhale 1 puff into the lungs every 6 (six) hours as needed for wheezing or shortness of breath.    Historical Provider, MD  anastrozole (ARIMIDEX) 1 MG tablet Take 1 mg by mouth daily.    Historical Provider, MD  aspirin EC 81 MG tablet Take 81 mg by mouth daily.    Historical Provider, MD  azithromycin (ZITHROMAX) 250 MG tablet Take 1 tablet (250 mg total) by mouth daily. 07/14/14   Maudry Diego, MD  cetirizine (ZYRTEC) 10 MG tablet Take 10 mg by mouth daily as needed for allergies.    Historical Provider, MD  Dextromethorphan-Guaifenesin (CHEST CONGESTION/COUGH RELIEF PO) Take 5-10 mLs by mouth daily as needed (for cough).    Historical Provider, MD  dextromethorphan-guaiFENesin (MUCINEX DM) 30-600 MG per 12 hr tablet Take 1 tablet by mouth 2 (two) times daily as needed for cough.    Historical Provider, MD  glimepiride (AMARYL) 4 MG tablet Take 4 mg by mouth every morning.     Historical Provider, MD  ibuprofen (ADVIL,MOTRIN) 200 MG tablet Take 200 mg by mouth daily as needed for mild pain or moderate pain.     Historical Provider, MD  metFORMIN (GLUCOPHAGE) 500 MG tablet Take 1,000 mg by mouth 2 (two) times daily.     Historical Provider, MD  oxymetazoline (NASAL SPRAY EXTRA MOISTURIZING) 0.05 % nasal spray Place 1 spray into both nostrils 2 (two) times daily as needed for  congestion.    Historical Provider, MD  simvastatin (ZOCOR) 40 MG tablet Take 40 mg by mouth every evening.    Historical Provider, MD  venlafaxine XR (EFFEXOR-XR) 75 MG 24 hr capsule Take 75 mg by mouth daily.    Historical Provider, MD   BP 166/84 mmHg  Pulse 99  Temp(Src) 99.2 F (37.3 C) (Oral)  Resp 20  Ht 5\' 4"  (1.626 m)  Wt 165 lb (74.844 kg)  BMI 28.31 kg/m2  SpO2 97%  Physical Exam  Constitutional: She is oriented to person, place, and time. She appears well-developed and well-nourished. No distress.  HENT:  Head: Normocephalic and  atraumatic.  Eyes: Conjunctivae are normal. Pupils are equal, round, and reactive to light. Right eye exhibits no discharge. Left eye exhibits no discharge.  Neck: Normal range of motion. Neck supple. No JVD present.  Cardiovascular: Normal rate, regular rhythm and normal heart sounds.  Exam reveals no gallop and no friction rub.   No murmur heard. Pulmonary/Chest: Effort normal. No respiratory distress. She has wheezes. She has no rhonchi. She has no rales.  Diffused expiratory wheezes.  Abdominal: Soft. Bowel sounds are normal. She exhibits no distension and no mass. There is no tenderness.  Musculoskeletal: Normal range of motion. She exhibits no edema or tenderness.  Lymphadenopathy:    She has no cervical adenopathy.  Neurological: She is alert and oriented to person, place, and time. She has normal reflexes. No cranial nerve deficit. Coordination normal.  Skin: Skin is warm and dry. No rash noted.  Psychiatric: She has a normal mood and affect. Her behavior is normal. Judgment and thought content normal.  Nursing note and vitals reviewed.   ED Course  Procedures (including critical care time)  DIAGNOSTIC STUDIES: Oxygen Saturation is 97% on RA, adequate by my interpretation.    COORDINATION OF CARE: 11:30 PM- Pt advised of plan for treatment and pt agrees.  Medications  ipratropium-albuterol (DUONEB) 0.5-2.5 (3) MG/3ML nebulizer solution 3 mL (not administered)     Labs Review Labs Reviewed  CBG MONITORING, ED    Imaging Review Dg Chest 2 View  09/05/2014   CLINICAL DATA:  Productive cough for 2 days, shortness of breath. History of RIGHT breast cancer.  EXAM: CHEST  2 VIEW  COMPARISON:  Chest radiograph July 14, 2014  FINDINGS: The cardiac silhouette is upper limits of normal, unchanged. Tortuous, mildly calcified aorta mild chronic interstitial changes. No pleural effusions or focal consolidations. Mildly elevated RIGHT hemidiaphragm. No pneumothorax.  Status post  RIGHT mastectomy with total clips RIGHT axilla. Enchondroma LEFT humerus. The patient is mildly osteopenic. Surgical clips in the included right abdomen likely reflect cholecystectomy.  IMPRESSION: Borderline cardiomegaly, no acute pulmonary process.   Electronically Signed   By: Elon Alas   On: 09/05/2014 22:17    MDM   Final diagnoses:  Acute bronchitis, unspecified organism    Cough with wheezing consistent with acute bronchitis. Review of old records shows prior ED visits for bronchitis. Chest x-ray shows no evidence of pneumonia. She is given an albuterol with ipratropium nebulizer treatment with excellent relief of symptoms. On reexam, lungs are clear. Blood sugar is not significantly elevated. She is given a dose of dexamethasone in the ED and was given an albuterol inhaler to take home. Follow-up with PCP.  I personally performed the services described in this documentation, which was scribed in my presence. The recorded information has been reviewed and is accurate.    Delora Fuel, MD 93/23/55 [id number]

## 2014-09-06 DIAGNOSIS — J209 Acute bronchitis, unspecified: Secondary | ICD-10-CM | POA: Diagnosis not present

## 2014-09-06 LAB — CBG MONITORING, ED: Glucose-Capillary: 96 mg/dL (ref 70–99)

## 2014-09-06 MED ORDER — DEXAMETHASONE 4 MG PO TABS
12.0000 mg | ORAL_TABLET | Freq: Once | ORAL | Status: AC
  Administered 2014-09-06: 12 mg via ORAL
  Filled 2014-09-06: qty 3

## 2014-09-06 MED ORDER — ALBUTEROL SULFATE HFA 108 (90 BASE) MCG/ACT IN AERS
2.0000 | INHALATION_SPRAY | RESPIRATORY_TRACT | Status: DC | PRN
  Filled 2014-09-06: qty 6.7

## 2014-09-06 NOTE — Discharge Instructions (Signed)
Acute Bronchitis °Bronchitis is inflammation of the airways that extend from the windpipe into the lungs (bronchi). The inflammation often causes mucus to develop. This leads to a cough, which is the most common symptom of bronchitis.  °In acute bronchitis, the condition usually develops suddenly and goes away over time, usually in a couple weeks. Smoking, allergies, and asthma can make bronchitis worse. Repeated episodes of bronchitis may cause further lung problems.  °CAUSES °Acute bronchitis is most often caused by the same virus that causes a cold. The virus can spread from person to person (contagious) through coughing, sneezing, and touching contaminated objects. °SIGNS AND SYMPTOMS  °· Cough.   °· Fever.   °· Coughing up mucus.   °· Body aches.   °· Chest congestion.   °· Chills.   °· Shortness of breath.   °· Sore throat.   °DIAGNOSIS  °Acute bronchitis is usually diagnosed through a physical exam. Your health care provider will also ask you questions about your medical history. Tests, such as chest X-rays, are sometimes done to rule out other conditions.  °TREATMENT  °Acute bronchitis usually goes away in a couple weeks. Oftentimes, no medical treatment is necessary. Medicines are sometimes given for relief of fever or cough. Antibiotic medicines are usually not needed but may be prescribed in certain situations. In some cases, an inhaler may be recommended to help reduce shortness of breath and control the cough. A cool mist vaporizer may also be used to help thin bronchial secretions and make it easier to clear the chest.  °HOME CARE INSTRUCTIONS °· Get plenty of rest.   °· Drink enough fluids to keep your urine clear or pale yellow (unless you have a medical condition that requires fluid restriction). Increasing fluids may help thin your respiratory secretions (sputum) and reduce chest congestion, and it will prevent dehydration.   °· Take medicines only as directed by your health care provider. °· If  you were prescribed an antibiotic medicine, finish it all even if you start to feel better. °· Avoid smoking and secondhand smoke. Exposure to cigarette smoke or irritating chemicals will make bronchitis worse. If you are a smoker, consider using nicotine gum or skin patches to help control withdrawal symptoms. Quitting smoking will help your lungs heal faster.   °· Reduce the chances of another bout of acute bronchitis by washing your hands frequently, avoiding people with cold symptoms, and trying not to touch your hands to your mouth, nose, or eyes.   °· Keep all follow-up visits as directed by your health care provider.   °SEEK MEDICAL CARE IF: °Your symptoms do not improve after 1 week of treatment.  °SEEK IMMEDIATE MEDICAL CARE IF: °· You develop an increased fever or chills.   °· You have chest pain.   °· You have severe shortness of breath. °· You have bloody sputum.   °· You develop dehydration. °· You faint or repeatedly feel like you are going to pass out. °· You develop repeated vomiting. °· You develop a severe headache. °MAKE SURE YOU:  °· Understand these instructions. °· Will watch your condition. °· Will get help right away if you are not doing well or get worse. °Document Released: 10/08/2004 Document Revised: 01/15/2014 Document Reviewed: 02/21/2013 °ExitCare® Patient Information ©2015 [employer]. This information is not intended to replace advice given to you by your health care provider. Make sure you discuss any questions you have with your health care provider. ° °Albuterol inhalation aerosol °What is this medicine? °ALBUTEROL (al BYOO ter ole) is a bronchodilator. It helps open up the airways in your   lungs to make it easier to breathe. This medicine is used to treat and to prevent bronchospasm. °This medicine may be used for other purposes; ask your health care provider or pharmacist if you have questions. °COMMON BRAND NAME(S): Proair HFA, Proventil, Proventil HFA, Respirol, Ventolin,  Ventolin HFA °What should I tell my health care provider before I take this medicine? °They need to know if you have any of the following conditions: °-diabetes °-heart disease or irregular heartbeat °-high blood pressure °-pheochromocytoma °-seizures °-thyroid disease °-an unusual or allergic reaction to albuterol, levalbuterol, sulfites, other medicines, foods, dyes, or preservatives °-pregnant or trying to get pregnant °-breast-feeding °How should I use this medicine? °This medicine is for inhalation through the mouth. Follow the directions on your prescription label. Take your medicine at regular intervals. Do not use more often than directed. Make sure that you are using your inhaler correctly. Ask you doctor or health care provider if you have any questions. °Talk to your pediatrician regarding the use of this medicine in children. Special care may be needed. °Overdosage: If you think you have taken too much of this medicine contact a poison control center or emergency room at once. °NOTE: This medicine is only for you. Do not share this medicine with others. °What if I miss a dose? °If you miss a dose, use it as soon as you can. If it is almost time for your next dose, use only that dose. Do not use double or extra doses. °What may interact with this medicine? °-anti-infectives like chloroquine and pentamidine °-caffeine °-cisapride °-diuretics °-medicines for colds °-medicines for depression or for emotional or psychotic conditions °-medicines for weight loss including some herbal products °-methadone °-some antibiotics like clarithromycin, erythromycin, levofloxacin, and linezolid °-some heart medicines °-steroid hormones like dexamethasone, cortisone, hydrocortisone °-theophylline °-thyroid hormones °This list may not describe all possible interactions. Give your health care provider a list of all the medicines, herbs, non-prescription drugs, or dietary supplements you use. Also tell them if you smoke,  drink alcohol, or use illegal drugs. Some items may interact with your medicine. °What should I watch for while using this medicine? °Tell your doctor or health care professional if your symptoms do not improve. Do not use extra albuterol. If your asthma or bronchitis gets worse while you are using this medicine, call your doctor right away. °If your mouth gets dry try chewing sugarless gum or sucking hard candy. Drink water as directed. °What side effects may I notice from receiving this medicine? °Side effects that you should report to your doctor or health care professional as soon as possible: °-allergic reactions like skin rash, itching or hives, swelling of the face, lips, or tongue °-breathing problems °-chest pain °-feeling faint or lightheaded, falls °-high blood pressure °-irregular heartbeat °-fever °-muscle cramps or weakness °-pain, tingling, numbness in the hands or feet °-vomiting °Side effects that usually do not require medical attention (report to your doctor or health care professional if they continue or are bothersome): °-cough °-difficulty sleeping °-headache °-nervousness or trembling °-stomach upset °-stuffy or runny nose °-throat irritation °-unusual taste °This list may not describe all possible side effects. Call your doctor for medical advice about side effects. You may report side effects to FDA at 1-800-FDA-1088. °Where should I keep my medicine? °Keep out of the reach of children. °Store at room temperature between 15 and 30 degrees C (59 and 86 degrees F). The contents are under pressure and may burst when exposed to heat or flame. Do not   freeze. This medicine does not work as well if it is too cold. Throw away any unused medicine after the expiration date. Inhalers need to be thrown away after the labeled number of puffs have been used or by the expiration date; whichever comes first. Ventolin HFA should be thrown away 12 months after removing from foil pouch. Check the instructions  that come with your medicine. NOTE: This sheet is a summary. It may not cover all possible information. If you have questions about this medicine, talk to your doctor, pharmacist, or health care provider.  2015, [employer]. (2013-02-16 10:57:17)

## 2014-12-18 DIAGNOSIS — Z853 Personal history of malignant neoplasm of breast: Secondary | ICD-10-CM | POA: Diagnosis not present

## 2014-12-18 DIAGNOSIS — Z79899 Other long term (current) drug therapy: Secondary | ICD-10-CM | POA: Diagnosis not present

## 2014-12-18 DIAGNOSIS — R062 Wheezing: Secondary | ICD-10-CM | POA: Diagnosis not present

## 2014-12-18 DIAGNOSIS — R509 Fever, unspecified: Secondary | ICD-10-CM | POA: Diagnosis not present

## 2014-12-18 DIAGNOSIS — E785 Hyperlipidemia, unspecified: Secondary | ICD-10-CM | POA: Diagnosis not present

## 2014-12-18 DIAGNOSIS — R0902 Hypoxemia: Secondary | ICD-10-CM | POA: Diagnosis not present

## 2014-12-18 DIAGNOSIS — E119 Type 2 diabetes mellitus without complications: Secondary | ICD-10-CM | POA: Diagnosis not present

## 2014-12-18 DIAGNOSIS — J209 Acute bronchitis, unspecified: Secondary | ICD-10-CM | POA: Diagnosis not present

## 2014-12-18 DIAGNOSIS — R05 Cough: Secondary | ICD-10-CM | POA: Diagnosis not present

## 2014-12-18 DIAGNOSIS — E1165 Type 2 diabetes mellitus with hyperglycemia: Secondary | ICD-10-CM | POA: Diagnosis not present

## 2014-12-18 DIAGNOSIS — I10 Essential (primary) hypertension: Secondary | ICD-10-CM | POA: Diagnosis not present

## 2014-12-18 DIAGNOSIS — R0602 Shortness of breath: Secondary | ICD-10-CM | POA: Diagnosis not present

## 2014-12-18 DIAGNOSIS — Z9011 Acquired absence of right breast and nipple: Secondary | ICD-10-CM | POA: Diagnosis not present

## 2014-12-18 DIAGNOSIS — J4541 Moderate persistent asthma with (acute) exacerbation: Secondary | ICD-10-CM | POA: Diagnosis not present

## 2014-12-18 DIAGNOSIS — M6281 Muscle weakness (generalized): Secondary | ICD-10-CM | POA: Diagnosis not present

## 2014-12-19 DIAGNOSIS — J209 Acute bronchitis, unspecified: Secondary | ICD-10-CM | POA: Diagnosis not present

## 2014-12-19 DIAGNOSIS — J4520 Mild intermittent asthma, uncomplicated: Secondary | ICD-10-CM | POA: Diagnosis not present

## 2014-12-19 DIAGNOSIS — E1165 Type 2 diabetes mellitus with hyperglycemia: Secondary | ICD-10-CM | POA: Diagnosis not present

## 2014-12-19 DIAGNOSIS — R0902 Hypoxemia: Secondary | ICD-10-CM | POA: Diagnosis not present

## 2014-12-21 DIAGNOSIS — J209 Acute bronchitis, unspecified: Secondary | ICD-10-CM | POA: Diagnosis not present

## 2014-12-21 DIAGNOSIS — J45901 Unspecified asthma with (acute) exacerbation: Secondary | ICD-10-CM | POA: Diagnosis not present

## 2015-01-30 ENCOUNTER — Ambulatory Visit (HOSPITAL_COMMUNITY)
Admission: RE | Admit: 2015-01-30 | Discharge: 2015-01-30 | Disposition: A | Discharge status: Home / Self Care | Payer: Medicare Other | Source: Ambulatory Visit | Attending: Pulmonary Disease | Admitting: Pulmonary Disease

## 2015-01-30 ENCOUNTER — Other Ambulatory Visit (HOSPITAL_COMMUNITY): Payer: Self-pay | Admitting: Pulmonary Disease

## 2015-01-30 DIAGNOSIS — W07XXXA Fall from chair, initial encounter: Secondary | ICD-10-CM | POA: Diagnosis not present

## 2015-01-30 DIAGNOSIS — S4991XA Unspecified injury of right shoulder and upper arm, initial encounter: Secondary | ICD-10-CM | POA: Diagnosis not present

## 2015-01-30 DIAGNOSIS — M25511 Pain in right shoulder: Secondary | ICD-10-CM

## 2015-02-06 DIAGNOSIS — Z0000 Encounter for general adult medical examination without abnormal findings: Secondary | ICD-10-CM | POA: Diagnosis not present

## 2015-02-08 DIAGNOSIS — Z1231 Encounter for screening mammogram for malignant neoplasm of breast: Secondary | ICD-10-CM | POA: Diagnosis not present

## 2015-02-08 DIAGNOSIS — Z79899 Other long term (current) drug therapy: Secondary | ICD-10-CM | POA: Diagnosis not present

## 2015-02-08 DIAGNOSIS — C50911 Malignant neoplasm of unspecified site of right female breast: Secondary | ICD-10-CM | POA: Diagnosis not present

## 2015-02-08 DIAGNOSIS — N6489 Other specified disorders of breast: Secondary | ICD-10-CM | POA: Diagnosis not present

## 2015-02-08 DIAGNOSIS — Z79811 Long term (current) use of aromatase inhibitors: Secondary | ICD-10-CM | POA: Diagnosis not present

## 2015-02-08 DIAGNOSIS — Z9011 Acquired absence of right breast and nipple: Secondary | ICD-10-CM | POA: Diagnosis not present

## 2015-02-08 DIAGNOSIS — Z853 Personal history of malignant neoplasm of breast: Secondary | ICD-10-CM | POA: Diagnosis not present

## 2015-02-08 DIAGNOSIS — Z170 Estrogen receptor positive status [ER+]: Secondary | ICD-10-CM | POA: Diagnosis not present

## 2015-02-08 DIAGNOSIS — Z9889 Other specified postprocedural states: Secondary | ICD-10-CM | POA: Diagnosis not present

## 2015-02-14 LAB — FECAL OCCULT BLOOD, GUAIAC: Fecal Occult Blood: NEGATIVE

## 2015-02-15 DIAGNOSIS — F329 Major depressive disorder, single episode, unspecified: Secondary | ICD-10-CM | POA: Diagnosis not present

## 2015-02-15 DIAGNOSIS — E1165 Type 2 diabetes mellitus with hyperglycemia: Secondary | ICD-10-CM | POA: Diagnosis not present

## 2015-02-15 DIAGNOSIS — E669 Obesity, unspecified: Secondary | ICD-10-CM | POA: Diagnosis not present

## 2015-02-15 DIAGNOSIS — Z1211 Encounter for screening for malignant neoplasm of colon: Secondary | ICD-10-CM | POA: Diagnosis not present

## 2015-02-15 DIAGNOSIS — J45901 Unspecified asthma with (acute) exacerbation: Secondary | ICD-10-CM | POA: Diagnosis not present

## 2015-02-15 DIAGNOSIS — E785 Hyperlipidemia, unspecified: Secondary | ICD-10-CM | POA: Diagnosis not present

## 2015-02-15 DIAGNOSIS — Z0000 Encounter for general adult medical examination without abnormal findings: Secondary | ICD-10-CM | POA: Diagnosis not present

## 2015-02-28 ENCOUNTER — Ambulatory Visit (INDEPENDENT_AMBULATORY_CARE_PROVIDER_SITE_OTHER): Payer: Medicare Other | Admitting: Orthopedic Surgery

## 2015-02-28 VITALS — BP 147/70 | Ht 64.0 in | Wt 156.0 lb

## 2015-02-28 DIAGNOSIS — M7551 Bursitis of right shoulder: Secondary | ICD-10-CM

## 2015-02-28 NOTE — Patient Instructions (Signed)
Joint Injection  Care After  Refer to this sheet in the next few days. These instructions provide you with information on caring for yourself after you have had a joint injection. Your caregiver also may give you more specific instructions. Your treatment has been planned according to current medical practices, but problems sometimes occur. Call your caregiver if you have any problems or questions after your procedure.  After any type of joint injection, it is not uncommon to experience:  · Soreness, swelling, or bruising around the injection site.  · Mild numbness, tingling, or weakness around the injection site caused by the numbing medicine used before or with the injection.  It also is possible to experience the following effects associated with the specific agent after injection:  · Iodine-based contrast agents:  ¨ Allergic reaction (itching, hives, widespread redness, and swelling beyond the injection site).  · Corticosteroids (These effects are rare.):  ¨ Allergic reaction.  ¨ Increased blood sugar levels (If you have diabetes and you notice that your blood sugar levels have increased, notify your caregiver).  ¨ Increased blood pressure levels.  ¨ Mood swings.  · Hyaluronic acid in the use of viscosupplementation.  ¨ Temporary heat or redness.  ¨ Temporary rash and itching.  ¨ Increased fluid accumulation in the injected joint.  These effects all should resolve within a day after your procedure.   HOME CARE INSTRUCTIONS  · Limit yourself to light activity the day of your procedure. Avoid lifting heavy objects, bending, stooping, or twisting.  · Take prescription or over-the-counter pain medication as directed by your caregiver.  · You may apply ice to your injection site to reduce pain and swelling the day of your procedure. Ice may be applied 03-04 times:  ¨ Put ice in a plastic bag.  ¨ Place a towel between your skin and the bag.  ¨ Leave the ice on for no longer than 15-20 minutes each time.  SEEK  IMMEDIATE MEDICAL CARE IF:   · Pain and swelling get worse rather than better or extend beyond the injection site.  · Numbness does not go away.  · Blood or fluid continues to leak from the injection site.  · You have chest pain.  · You have swelling of your face or tongue.  · You have trouble breathing or you become dizzy.  · You develop a fever, chills, or severe tenderness at the injection site that last longer than 1 day.  MAKE SURE YOU:  · Understand these instructions.  · Watch your condition.  · Get help right away if you are not doing well or if you get worse.  Document Released: 05/14/2011 Document Revised: 11/23/2011 Document Reviewed: 05/14/2011  ExitCare® Patient Information ©2015 [employer]. This information is not intended to replace advice given to you by your health care provider. Make sure you discuss any questions you have with your health care provider.

## 2015-02-28 NOTE — Progress Notes (Signed)
Patient ID: Amy Hoffman, female   DOB: 06-25-1941, 74 y.o.   MRN: [id number]  New consult   Chief Complaint  Patient presents with  . Shoulder Pain    Right shoulder pain, DOI 01-21-15. Dr. Luan Pulling for consult and treat.     Amy Hoffman is a 74 y.o. female.   HPI 74 years old female fell on Mother's Day presents with right shoulder pain, quality sharp timing constant severity 10 associated with for elevation of the arm. X-rays before meals joint arthritis otherwise normal shoulder  System review pertinent findings numbness and dizziness not related shoulder joint pain related to shoulder no night sweats history of fever weight loss shortness of breath   Review of Systems See hpi  Past Medical History  Diagnosis Date  . Diabetes mellitus without complication   . Cancer     breast cancer - right    Past Surgical History  Procedure Laterality Date  . Mastectomy Right     No family history on file.  Social History History  Substance Use Topics  . Smoking status: Never Smoker   . Smokeless tobacco: Not on file  . Alcohol Use: No    No Known Allergies  Current Outpatient Prescriptions  Medication Sig Dispense Refill  . albuterol (PROVENTIL HFA;VENTOLIN HFA) 108 (90 BASE) MCG/ACT inhaler Inhale 1 puff into the lungs every 6 (six) hours as needed for wheezing or shortness of breath.    . anastrozole (ARIMIDEX) 1 MG tablet Take 1 mg by mouth daily.    Marland Kitchen aspirin EC 81 MG tablet Take 81 mg by mouth daily.    Marland Kitchen azithromycin (ZITHROMAX) 250 MG tablet Take 1 tablet (250 mg total) by mouth daily. 4 tablet 0  . cetirizine (ZYRTEC) 10 MG tablet Take 10 mg by mouth daily as needed for allergies.    . Dextromethorphan-Guaifenesin (CHEST CONGESTION/COUGH RELIEF PO) Take 5-10 mLs by mouth daily as needed (for cough).    Marland Kitchen dextromethorphan-guaiFENesin (MUCINEX DM) 30-600 MG per 12 hr tablet Take 1 tablet by mouth 2 (two) times daily as needed for cough.    Marland Kitchen glimepiride (AMARYL) 4  MG tablet Take 4 mg by mouth every morning.     Marland Kitchen ibuprofen (ADVIL,MOTRIN) 200 MG tablet Take 200 mg by mouth daily as needed for mild pain or moderate pain.     . metFORMIN (GLUCOPHAGE) 500 MG tablet Take 1,000 mg by mouth 2 (two) times daily.     Marland Kitchen oxymetazoline (NASAL SPRAY EXTRA MOISTURIZING) 0.05 % nasal spray Place 1 spray into both nostrils 2 (two) times daily as needed for congestion.    . simvastatin (ZOCOR) 40 MG tablet Take 40 mg by mouth every evening.    . venlafaxine XR (EFFEXOR-XR) 75 MG 24 hr capsule Take 75 mg by mouth daily.     No current facility-administered medications for this visit.       Physical Exam Blood pressure 147/70, height 5\' 4"  (1.626 m), weight 156 lb (70.761 kg). Physical Exam The patient is well developed well nourished and well groomed. Orientation to person place and time is normal  Mood is pleasant.  Left shoulder range of motion is normal. No instability. Strength normal muscle tone normal. Skin intact.  Right shoulder no tenderness posteriorly or laterally but anterior upper tenderness in the rotator interval. Painful range of motion up to 120 painful passive range of motion 120-150. Stable in abduction external rotation no apprehension. Mild weakness rotator cuff. Skin intact. Sensation normal. Vascular  function good capillary refill and normal pulses. 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  . Shoulder bursitis, right Yes    Plan  Procedure note the subacromial injection shoulder RIGHT  Verbal consent was obtained to inject the  RIGHT   Shoulder  Timeout was completed to confirm the injection site is a subacromial space of the  RIGHT  shoulder   Medication used  Depo-Medrol 40 mg and lidocaine 1% 3 cc  Anesthesia was provided by ethyl chloride  The injection was performed in the RIGHT  posterior subacromial space. After pinning the skin with alcohol and anesthetized the skin with ethyl chloride the subacromial space was injected using a 20-gauge needle. There were no complications  Sterile dressing was applied.

## 2015-06-04 ENCOUNTER — Ambulatory Visit (INDEPENDENT_AMBULATORY_CARE_PROVIDER_SITE_OTHER): Payer: Medicare Other | Admitting: Orthopedic Surgery

## 2015-06-04 VITALS — BP 240/110 | Ht 64.0 in | Wt 164.0 lb

## 2015-06-04 DIAGNOSIS — M7551 Bursitis of right shoulder: Secondary | ICD-10-CM | POA: Diagnosis not present

## 2015-06-04 DIAGNOSIS — I10 Essential (primary) hypertension: Secondary | ICD-10-CM | POA: Diagnosis not present

## 2015-06-04 NOTE — Progress Notes (Signed)
Patient ID: Amy Hoffman, female   DOB: January 08, 1941, 74 y.o.   MRN: [id number]  Follow up visit  Chief Complaint  Patient presents with  . Follow-up    follow up right shoulder pain    BP 240/110 mmHg  Ht 5\' 4"  (1.626 m)  Wt 164 lb (74.39 kg)  BMI 28.14 kg/m2  Encounter Diagnosis  Name Primary?  . Shoulder bursitis, right Yes    Recurrent pain right shoulder. Patient says she's been doing a lot of activities around the home that she had to do for activities of daily living.  No numbness or tingling  Otherwise normal grooming hygiene general appearance normal oriented 3 mood pleasant Examination shoulder shows tenderness in the rotator interval. She has elevation above the 90 plane. Positive impingement sign. Cuff strength remains relatively good. No swelling or mass around the shoulder  Encounter Diagnosis  Name Primary?  . Shoulder bursitis, right Yes    Right shoulder injection:  Procedure note the subacromial injection shoulder RIGHT  Verbal consent was obtained to inject the  RIGHT   Shoulder  Timeout was completed to confirm the injection site is a subacromial space of the  RIGHT  shoulder   Medication used Depo-Medrol 40 mg and lidocaine 1% 3 cc  Anesthesia was provided by ethyl chloride  The injection was performed in the RIGHT  posterior subacromial space. After pinning the skin with alcohol and anesthetized the skin with ethyl chloride the subacromial space was injected using a 20-gauge needle. There were no complications  Sterile dressing was applied.

## 2015-07-08 DIAGNOSIS — I10 Essential (primary) hypertension: Secondary | ICD-10-CM | POA: Diagnosis not present

## 2015-07-08 DIAGNOSIS — E1165 Type 2 diabetes mellitus with hyperglycemia: Secondary | ICD-10-CM | POA: Diagnosis not present

## 2015-08-19 DIAGNOSIS — J45901 Unspecified asthma with (acute) exacerbation: Secondary | ICD-10-CM | POA: Diagnosis not present

## 2015-08-19 DIAGNOSIS — E1165 Type 2 diabetes mellitus with hyperglycemia: Secondary | ICD-10-CM | POA: Diagnosis not present

## 2015-08-19 DIAGNOSIS — N939 Abnormal uterine and vaginal bleeding, unspecified: Secondary | ICD-10-CM | POA: Diagnosis not present

## 2015-08-19 DIAGNOSIS — F419 Anxiety disorder, unspecified: Secondary | ICD-10-CM | POA: Diagnosis not present

## 2015-09-19 DIAGNOSIS — N819 Female genital prolapse, unspecified: Secondary | ICD-10-CM | POA: Diagnosis not present

## 2015-11-01 DIAGNOSIS — H1033 Unspecified acute conjunctivitis, bilateral: Secondary | ICD-10-CM | POA: Diagnosis not present

## 2015-11-01 DIAGNOSIS — E1165 Type 2 diabetes mellitus with hyperglycemia: Secondary | ICD-10-CM | POA: Diagnosis not present

## 2015-11-01 DIAGNOSIS — I10 Essential (primary) hypertension: Secondary | ICD-10-CM | POA: Diagnosis not present

## 2015-12-09 DIAGNOSIS — J45901 Unspecified asthma with (acute) exacerbation: Secondary | ICD-10-CM | POA: Diagnosis not present

## 2015-12-09 DIAGNOSIS — F419 Anxiety disorder, unspecified: Secondary | ICD-10-CM | POA: Diagnosis not present

## 2015-12-09 DIAGNOSIS — I10 Essential (primary) hypertension: Secondary | ICD-10-CM | POA: Diagnosis not present

## 2015-12-09 DIAGNOSIS — E1165 Type 2 diabetes mellitus with hyperglycemia: Secondary | ICD-10-CM | POA: Diagnosis not present

## 2016-01-14 DIAGNOSIS — H52223 Regular astigmatism, bilateral: Secondary | ICD-10-CM | POA: Diagnosis not present

## 2016-01-14 DIAGNOSIS — H524 Presbyopia: Secondary | ICD-10-CM | POA: Diagnosis not present

## 2016-01-14 DIAGNOSIS — H43813 Vitreous degeneration, bilateral: Secondary | ICD-10-CM | POA: Diagnosis not present

## 2016-01-14 DIAGNOSIS — H5203 Hypermetropia, bilateral: Secondary | ICD-10-CM | POA: Diagnosis not present

## 2016-01-28 DIAGNOSIS — M5033 Other cervical disc degeneration, cervicothoracic region: Secondary | ICD-10-CM | POA: Diagnosis not present

## 2016-01-28 DIAGNOSIS — M9901 Segmental and somatic dysfunction of cervical region: Secondary | ICD-10-CM | POA: Diagnosis not present

## 2016-01-29 DIAGNOSIS — M9901 Segmental and somatic dysfunction of cervical region: Secondary | ICD-10-CM | POA: Diagnosis not present

## 2016-01-29 DIAGNOSIS — M5033 Other cervical disc degeneration, cervicothoracic region: Secondary | ICD-10-CM | POA: Diagnosis not present

## 2016-01-30 DIAGNOSIS — M5033 Other cervical disc degeneration, cervicothoracic region: Secondary | ICD-10-CM | POA: Diagnosis not present

## 2016-01-30 DIAGNOSIS — M9901 Segmental and somatic dysfunction of cervical region: Secondary | ICD-10-CM | POA: Diagnosis not present

## 2016-02-03 DIAGNOSIS — M9901 Segmental and somatic dysfunction of cervical region: Secondary | ICD-10-CM | POA: Diagnosis not present

## 2016-02-03 DIAGNOSIS — M5033 Other cervical disc degeneration, cervicothoracic region: Secondary | ICD-10-CM | POA: Diagnosis not present

## 2016-02-05 DIAGNOSIS — M5033 Other cervical disc degeneration, cervicothoracic region: Secondary | ICD-10-CM | POA: Diagnosis not present

## 2016-02-05 DIAGNOSIS — M9901 Segmental and somatic dysfunction of cervical region: Secondary | ICD-10-CM | POA: Diagnosis not present

## 2016-02-11 DIAGNOSIS — Z1231 Encounter for screening mammogram for malignant neoplasm of breast: Secondary | ICD-10-CM | POA: Diagnosis not present

## 2016-02-11 DIAGNOSIS — C50911 Malignant neoplasm of unspecified site of right female breast: Secondary | ICD-10-CM | POA: Diagnosis not present

## 2016-02-11 DIAGNOSIS — C50811 Malignant neoplasm of overlapping sites of right female breast: Secondary | ICD-10-CM | POA: Diagnosis not present

## 2016-02-13 DIAGNOSIS — M9901 Segmental and somatic dysfunction of cervical region: Secondary | ICD-10-CM | POA: Diagnosis not present

## 2016-02-13 DIAGNOSIS — M5033 Other cervical disc degeneration, cervicothoracic region: Secondary | ICD-10-CM | POA: Diagnosis not present

## 2016-02-17 DIAGNOSIS — M5033 Other cervical disc degeneration, cervicothoracic region: Secondary | ICD-10-CM | POA: Diagnosis not present

## 2016-02-17 DIAGNOSIS — M9901 Segmental and somatic dysfunction of cervical region: Secondary | ICD-10-CM | POA: Diagnosis not present

## 2016-02-20 DIAGNOSIS — M5033 Other cervical disc degeneration, cervicothoracic region: Secondary | ICD-10-CM | POA: Diagnosis not present

## 2016-02-20 DIAGNOSIS — M9901 Segmental and somatic dysfunction of cervical region: Secondary | ICD-10-CM | POA: Diagnosis not present

## 2016-02-25 DIAGNOSIS — M5033 Other cervical disc degeneration, cervicothoracic region: Secondary | ICD-10-CM | POA: Diagnosis not present

## 2016-02-25 DIAGNOSIS — M9901 Segmental and somatic dysfunction of cervical region: Secondary | ICD-10-CM | POA: Diagnosis not present

## 2016-02-26 DIAGNOSIS — M9901 Segmental and somatic dysfunction of cervical region: Secondary | ICD-10-CM | POA: Diagnosis not present

## 2016-02-26 DIAGNOSIS — M5033 Other cervical disc degeneration, cervicothoracic region: Secondary | ICD-10-CM | POA: Diagnosis not present

## 2016-02-27 DIAGNOSIS — M9901 Segmental and somatic dysfunction of cervical region: Secondary | ICD-10-CM | POA: Diagnosis not present

## 2016-02-27 DIAGNOSIS — M5033 Other cervical disc degeneration, cervicothoracic region: Secondary | ICD-10-CM | POA: Diagnosis not present

## 2016-03-10 DIAGNOSIS — F419 Anxiety disorder, unspecified: Secondary | ICD-10-CM | POA: Diagnosis not present

## 2016-03-10 DIAGNOSIS — I10 Essential (primary) hypertension: Secondary | ICD-10-CM | POA: Diagnosis not present

## 2016-03-10 DIAGNOSIS — E119 Type 2 diabetes mellitus without complications: Secondary | ICD-10-CM | POA: Diagnosis not present

## 2016-05-12 ENCOUNTER — Other Ambulatory Visit: Payer: Self-pay

## 2016-07-20 DIAGNOSIS — N950 Postmenopausal bleeding: Secondary | ICD-10-CM | POA: Diagnosis not present

## 2016-07-20 DIAGNOSIS — N814 Uterovaginal prolapse, unspecified: Secondary | ICD-10-CM | POA: Diagnosis not present

## 2016-07-20 DIAGNOSIS — Z6827 Body mass index (BMI) 27.0-27.9, adult: Secondary | ICD-10-CM | POA: Diagnosis not present

## 2016-07-28 DIAGNOSIS — J45909 Unspecified asthma, uncomplicated: Secondary | ICD-10-CM | POA: Diagnosis not present

## 2016-07-28 DIAGNOSIS — Z23 Encounter for immunization: Secondary | ICD-10-CM | POA: Diagnosis not present

## 2016-07-28 DIAGNOSIS — E1165 Type 2 diabetes mellitus with hyperglycemia: Secondary | ICD-10-CM | POA: Diagnosis not present

## 2016-07-28 DIAGNOSIS — N939 Abnormal uterine and vaginal bleeding, unspecified: Secondary | ICD-10-CM | POA: Diagnosis not present

## 2016-07-28 DIAGNOSIS — I10 Essential (primary) hypertension: Secondary | ICD-10-CM | POA: Diagnosis not present

## 2016-08-05 ENCOUNTER — Encounter: Payer: Self-pay | Admitting: Pulmonary Disease

## 2016-08-05 DIAGNOSIS — E1165 Type 2 diabetes mellitus with hyperglycemia: Secondary | ICD-10-CM | POA: Diagnosis not present

## 2016-08-05 DIAGNOSIS — N939 Abnormal uterine and vaginal bleeding, unspecified: Secondary | ICD-10-CM | POA: Diagnosis not present

## 2016-08-05 DIAGNOSIS — I10 Essential (primary) hypertension: Secondary | ICD-10-CM | POA: Diagnosis not present

## 2016-08-05 DIAGNOSIS — J45909 Unspecified asthma, uncomplicated: Secondary | ICD-10-CM | POA: Diagnosis not present

## 2016-09-11 DIAGNOSIS — Z791 Long term (current) use of non-steroidal anti-inflammatories (NSAID): Secondary | ICD-10-CM | POA: Diagnosis not present

## 2016-09-11 DIAGNOSIS — F329 Major depressive disorder, single episode, unspecified: Secondary | ICD-10-CM | POA: Diagnosis not present

## 2016-09-11 DIAGNOSIS — Z853 Personal history of malignant neoplasm of breast: Secondary | ICD-10-CM | POA: Diagnosis not present

## 2016-09-11 DIAGNOSIS — Z9049 Acquired absence of other specified parts of digestive tract: Secondary | ICD-10-CM | POA: Diagnosis not present

## 2016-09-11 DIAGNOSIS — E7800 Pure hypercholesterolemia, unspecified: Secondary | ICD-10-CM | POA: Diagnosis not present

## 2016-09-11 DIAGNOSIS — N841 Polyp of cervix uteri: Secondary | ICD-10-CM | POA: Diagnosis not present

## 2016-09-11 DIAGNOSIS — K219 Gastro-esophageal reflux disease without esophagitis: Secondary | ICD-10-CM | POA: Diagnosis not present

## 2016-09-11 DIAGNOSIS — J45909 Unspecified asthma, uncomplicated: Secondary | ICD-10-CM | POA: Diagnosis not present

## 2016-09-11 DIAGNOSIS — Z79899 Other long term (current) drug therapy: Secondary | ICD-10-CM | POA: Diagnosis not present

## 2016-09-11 DIAGNOSIS — Z833 Family history of diabetes mellitus: Secondary | ICD-10-CM | POA: Diagnosis not present

## 2016-09-11 DIAGNOSIS — N840 Polyp of corpus uteri: Secondary | ICD-10-CM | POA: Diagnosis not present

## 2016-09-11 DIAGNOSIS — Z806 Family history of leukemia: Secondary | ICD-10-CM | POA: Diagnosis not present

## 2016-09-11 DIAGNOSIS — N814 Uterovaginal prolapse, unspecified: Secondary | ICD-10-CM | POA: Diagnosis not present

## 2016-09-11 DIAGNOSIS — E119 Type 2 diabetes mellitus without complications: Secondary | ICD-10-CM | POA: Diagnosis not present

## 2016-09-11 DIAGNOSIS — Z803 Family history of malignant neoplasm of breast: Secondary | ICD-10-CM | POA: Diagnosis not present

## 2016-09-11 DIAGNOSIS — Z9011 Acquired absence of right breast and nipple: Secondary | ICD-10-CM | POA: Diagnosis not present

## 2016-09-11 DIAGNOSIS — I10 Essential (primary) hypertension: Secondary | ICD-10-CM | POA: Diagnosis not present

## 2016-09-11 DIAGNOSIS — Z7982 Long term (current) use of aspirin: Secondary | ICD-10-CM | POA: Diagnosis not present

## 2016-09-14 DIAGNOSIS — Z9011 Acquired absence of right breast and nipple: Secondary | ICD-10-CM | POA: Diagnosis not present

## 2016-09-14 DIAGNOSIS — Z791 Long term (current) use of non-steroidal anti-inflammatories (NSAID): Secondary | ICD-10-CM | POA: Diagnosis not present

## 2016-09-14 DIAGNOSIS — Z803 Family history of malignant neoplasm of breast: Secondary | ICD-10-CM | POA: Diagnosis not present

## 2016-09-14 DIAGNOSIS — E119 Type 2 diabetes mellitus without complications: Secondary | ICD-10-CM | POA: Diagnosis not present

## 2016-09-14 DIAGNOSIS — Z833 Family history of diabetes mellitus: Secondary | ICD-10-CM | POA: Diagnosis not present

## 2016-09-14 DIAGNOSIS — N814 Uterovaginal prolapse, unspecified: Secondary | ICD-10-CM | POA: Diagnosis not present

## 2016-09-14 DIAGNOSIS — E7800 Pure hypercholesterolemia, unspecified: Secondary | ICD-10-CM | POA: Diagnosis not present

## 2016-09-14 DIAGNOSIS — Z806 Family history of leukemia: Secondary | ICD-10-CM | POA: Diagnosis not present

## 2016-09-14 DIAGNOSIS — F329 Major depressive disorder, single episode, unspecified: Secondary | ICD-10-CM | POA: Diagnosis not present

## 2016-09-14 DIAGNOSIS — Z7982 Long term (current) use of aspirin: Secondary | ICD-10-CM | POA: Diagnosis not present

## 2016-09-14 DIAGNOSIS — N841 Polyp of cervix uteri: Secondary | ICD-10-CM | POA: Diagnosis not present

## 2016-09-14 DIAGNOSIS — Z9049 Acquired absence of other specified parts of digestive tract: Secondary | ICD-10-CM | POA: Diagnosis not present

## 2016-09-14 DIAGNOSIS — I10 Essential (primary) hypertension: Secondary | ICD-10-CM | POA: Diagnosis not present

## 2016-09-14 DIAGNOSIS — Z79899 Other long term (current) drug therapy: Secondary | ICD-10-CM | POA: Diagnosis not present

## 2016-09-14 DIAGNOSIS — J45909 Unspecified asthma, uncomplicated: Secondary | ICD-10-CM | POA: Diagnosis not present

## 2016-09-14 DIAGNOSIS — Z853 Personal history of malignant neoplasm of breast: Secondary | ICD-10-CM | POA: Diagnosis not present

## 2016-09-14 DIAGNOSIS — N840 Polyp of corpus uteri: Secondary | ICD-10-CM | POA: Diagnosis not present

## 2016-09-14 DIAGNOSIS — K219 Gastro-esophageal reflux disease without esophagitis: Secondary | ICD-10-CM | POA: Diagnosis not present

## 2016-09-15 DIAGNOSIS — Z791 Long term (current) use of non-steroidal anti-inflammatories (NSAID): Secondary | ICD-10-CM | POA: Diagnosis not present

## 2016-09-15 DIAGNOSIS — E7800 Pure hypercholesterolemia, unspecified: Secondary | ICD-10-CM | POA: Diagnosis not present

## 2016-09-15 DIAGNOSIS — E785 Hyperlipidemia, unspecified: Secondary | ICD-10-CM | POA: Diagnosis not present

## 2016-09-15 DIAGNOSIS — N814 Uterovaginal prolapse, unspecified: Secondary | ICD-10-CM | POA: Diagnosis not present

## 2016-09-15 DIAGNOSIS — J45909 Unspecified asthma, uncomplicated: Secondary | ICD-10-CM | POA: Diagnosis not present

## 2016-09-15 DIAGNOSIS — N819 Female genital prolapse, unspecified: Secondary | ICD-10-CM | POA: Diagnosis not present

## 2016-09-15 DIAGNOSIS — I10 Essential (primary) hypertension: Secondary | ICD-10-CM | POA: Diagnosis not present

## 2016-09-15 DIAGNOSIS — K219 Gastro-esophageal reflux disease without esophagitis: Secondary | ICD-10-CM | POA: Diagnosis not present

## 2016-09-15 DIAGNOSIS — Z7982 Long term (current) use of aspirin: Secondary | ICD-10-CM | POA: Diagnosis not present

## 2016-09-15 DIAGNOSIS — F329 Major depressive disorder, single episode, unspecified: Secondary | ICD-10-CM | POA: Diagnosis not present

## 2016-09-15 DIAGNOSIS — N840 Polyp of corpus uteri: Secondary | ICD-10-CM | POA: Diagnosis not present

## 2016-09-15 DIAGNOSIS — Z79899 Other long term (current) drug therapy: Secondary | ICD-10-CM | POA: Diagnosis not present

## 2016-09-15 DIAGNOSIS — N841 Polyp of cervix uteri: Secondary | ICD-10-CM | POA: Diagnosis not present

## 2016-09-15 DIAGNOSIS — E119 Type 2 diabetes mellitus without complications: Secondary | ICD-10-CM | POA: Diagnosis not present

## 2016-09-16 DIAGNOSIS — N840 Polyp of corpus uteri: Secondary | ICD-10-CM | POA: Diagnosis not present

## 2016-09-16 DIAGNOSIS — F329 Major depressive disorder, single episode, unspecified: Secondary | ICD-10-CM | POA: Diagnosis not present

## 2016-09-16 DIAGNOSIS — N814 Uterovaginal prolapse, unspecified: Secondary | ICD-10-CM | POA: Diagnosis not present

## 2016-09-16 DIAGNOSIS — E119 Type 2 diabetes mellitus without complications: Secondary | ICD-10-CM | POA: Diagnosis not present

## 2016-09-16 DIAGNOSIS — N841 Polyp of cervix uteri: Secondary | ICD-10-CM | POA: Diagnosis not present

## 2016-09-16 DIAGNOSIS — I10 Essential (primary) hypertension: Secondary | ICD-10-CM | POA: Diagnosis not present

## 2016-10-28 DIAGNOSIS — I10 Essential (primary) hypertension: Secondary | ICD-10-CM | POA: Diagnosis not present

## 2016-10-28 DIAGNOSIS — E1165 Type 2 diabetes mellitus with hyperglycemia: Secondary | ICD-10-CM | POA: Diagnosis not present

## 2016-10-28 DIAGNOSIS — E785 Hyperlipidemia, unspecified: Secondary | ICD-10-CM | POA: Diagnosis not present

## 2016-10-28 DIAGNOSIS — J45909 Unspecified asthma, uncomplicated: Secondary | ICD-10-CM | POA: Diagnosis not present

## 2017-04-15 DIAGNOSIS — I10 Essential (primary) hypertension: Secondary | ICD-10-CM | POA: Diagnosis not present

## 2017-04-15 DIAGNOSIS — E119 Type 2 diabetes mellitus without complications: Secondary | ICD-10-CM | POA: Diagnosis not present

## 2017-04-15 DIAGNOSIS — J45909 Unspecified asthma, uncomplicated: Secondary | ICD-10-CM | POA: Diagnosis not present

## 2017-04-15 DIAGNOSIS — M1990 Unspecified osteoarthritis, unspecified site: Secondary | ICD-10-CM | POA: Diagnosis not present

## 2017-05-03 DIAGNOSIS — M898X9 Other specified disorders of bone, unspecified site: Secondary | ICD-10-CM | POA: Diagnosis not present

## 2017-05-03 DIAGNOSIS — Z9011 Acquired absence of right breast and nipple: Secondary | ICD-10-CM | POA: Diagnosis not present

## 2017-05-03 DIAGNOSIS — T451X5A Adverse effect of antineoplastic and immunosuppressive drugs, initial encounter: Secondary | ICD-10-CM | POA: Diagnosis not present

## 2017-05-03 DIAGNOSIS — Z853 Personal history of malignant neoplasm of breast: Secondary | ICD-10-CM | POA: Diagnosis not present

## 2017-05-03 DIAGNOSIS — C50811 Malignant neoplasm of overlapping sites of right female breast: Secondary | ICD-10-CM | POA: Diagnosis not present

## 2017-07-05 DIAGNOSIS — Z6827 Body mass index (BMI) 27.0-27.9, adult: Secondary | ICD-10-CM | POA: Diagnosis not present

## 2017-07-05 DIAGNOSIS — N763 Subacute and chronic vulvitis: Secondary | ICD-10-CM | POA: Diagnosis not present

## 2017-10-15 DIAGNOSIS — N760 Acute vaginitis: Secondary | ICD-10-CM | POA: Diagnosis not present

## 2017-10-15 DIAGNOSIS — Z6826 Body mass index (BMI) 26.0-26.9, adult: Secondary | ICD-10-CM | POA: Diagnosis not present

## 2017-11-15 DIAGNOSIS — Z23 Encounter for immunization: Secondary | ICD-10-CM | POA: Diagnosis not present

## 2017-12-06 DIAGNOSIS — I10 Essential (primary) hypertension: Secondary | ICD-10-CM | POA: Diagnosis not present

## 2017-12-06 DIAGNOSIS — E1165 Type 2 diabetes mellitus with hyperglycemia: Secondary | ICD-10-CM | POA: Diagnosis not present

## 2017-12-06 DIAGNOSIS — J301 Allergic rhinitis due to pollen: Secondary | ICD-10-CM | POA: Diagnosis not present

## 2017-12-06 DIAGNOSIS — J45909 Unspecified asthma, uncomplicated: Secondary | ICD-10-CM | POA: Diagnosis not present

## 2017-12-11 ENCOUNTER — Other Ambulatory Visit: Payer: Self-pay

## 2017-12-11 ENCOUNTER — Emergency Department (HOSPITAL_COMMUNITY)
Admission: EM | Admit: 2017-12-11 | Discharge: 2017-12-11 | Disposition: A | Discharge status: Home / Self Care | Payer: Medicare Other | Attending: Emergency Medicine | Admitting: Emergency Medicine

## 2017-12-11 ENCOUNTER — Encounter (HOSPITAL_COMMUNITY): Payer: Self-pay | Admitting: *Deleted

## 2017-12-11 DIAGNOSIS — H5711 Ocular pain, right eye: Secondary | ICD-10-CM | POA: Insufficient documentation

## 2017-12-11 DIAGNOSIS — E119 Type 2 diabetes mellitus without complications: Secondary | ICD-10-CM | POA: Insufficient documentation

## 2017-12-11 DIAGNOSIS — Z79899 Other long term (current) drug therapy: Secondary | ICD-10-CM | POA: Diagnosis not present

## 2017-12-11 DIAGNOSIS — Z853 Personal history of malignant neoplasm of breast: Secondary | ICD-10-CM | POA: Insufficient documentation

## 2017-12-11 DIAGNOSIS — Z7984 Long term (current) use of oral hypoglycemic drugs: Secondary | ICD-10-CM | POA: Diagnosis not present

## 2017-12-11 DIAGNOSIS — Z9011 Acquired absence of right breast and nipple: Secondary | ICD-10-CM | POA: Insufficient documentation

## 2017-12-11 DIAGNOSIS — Z7982 Long term (current) use of aspirin: Secondary | ICD-10-CM | POA: Diagnosis not present

## 2017-12-11 MED ORDER — TETRACAINE HCL 0.5 % OP SOLN
OPHTHALMIC | Status: AC
  Administered 2017-12-11: 2 [drp] via OPHTHALMIC
  Filled 2017-12-11: qty 4

## 2017-12-11 MED ORDER — TETRACAINE HCL 0.5 % OP SOLN
2.0000 [drp] | Freq: Once | OPHTHALMIC | Status: AC
  Administered 2017-12-11: 2 [drp] via OPHTHALMIC

## 2017-12-11 MED ORDER — FLUORESCEIN SODIUM 1 MG OP STRP
ORAL_STRIP | OPHTHALMIC | Status: AC
  Administered 2017-12-11: 1
  Filled 2017-12-11: qty 1

## 2017-12-11 MED ORDER — ERYTHROMYCIN 5 MG/GM OP OINT
TOPICAL_OINTMENT | Freq: Three times a day (TID) | OPHTHALMIC | Status: DC
  Administered 2017-12-11: 02:00:00 via OPHTHALMIC
  Filled 2017-12-11: qty 3.5

## 2017-12-11 NOTE — ED Provider Notes (Signed)
[facility] EMERGENCY DEPARTMENT Provider Note   CSN: [id number] Arrival date & time: 12/11/17  0013     History   Chief Complaint Chief Complaint  Patient presents with  . Foreign Body in Amy Hoffman is a 77 y.o. female.  The history is provided by the patient.  Foreign Body in Eye  This is a new problem. The current episode started yesterday. The problem occurs constantly. The problem has not changed since onset.Pertinent negatives include no headaches. Nothing aggravates the symptoms. Nothing relieves the symptoms.   Patient reports that a snow globe broke and she may have gotten a piece of glass in her right eye.  This occurred over 24 hours ago.  She reports she is flushed it out multiple times, but does not feel that she is improved.  She reports she may have glass in her eye.  She denies any other complaints.  No history of eye surgery.  Does not wear contact Past Medical History:  Diagnosis Date  . Cancer [facility])    breast cancer - right  . Diabetes mellitus without complication [facility])     Patient Active Problem List   Diagnosis Date Noted  . Numbness and tingling in hands 04/17/2014  . SOB (shortness of breath) 01/30/2014  . Breast CA ([place]) 01/30/2014  . Elevated blood pressure 01/30/2014    Past Surgical History:  Procedure Laterality Date  . MASTECTOMY Right      OB History   None      Home Medications    Prior to Admission medications   Medication Sig Start Date End Date Taking? Authorizing Provider  albuterol (PROVENTIL HFA;VENTOLIN HFA) 108 (90 BASE) MCG/ACT inhaler Inhale 1 puff into the lungs every 6 (six) hours as needed for wheezing or shortness of breath.    [provider]  anastrozole (ARIMIDEX) 1 MG tablet Take 1 mg by mouth daily.    [provider]  aspirin EC 81 MG tablet Take 81 mg by mouth daily.    [provider]  glimepiride (AMARYL) 4 MG tablet Take 4 mg by mouth every morning.     [provider]  ibuprofen (ADVIL,MOTRIN) 200 MG tablet Take 200 mg by mouth daily as needed for mild pain or moderate pain.     [provider]  metFORMIN (GLUCOPHAGE) 500 MG tablet Take 1,000 mg by mouth 2 (two) times daily.     [provider]  simvastatin (ZOCOR) 40 MG tablet Take 40 mg by mouth every evening.    [provider]  venlafaxine XR (EFFEXOR-XR) 75 MG 24 hr capsule Take 75 mg by mouth daily.    [provider]    Family History History reviewed. No pertinent family history.  Social History Social History   Tobacco Use  . Smoking status: Never Smoker  . Smokeless tobacco: Never Used  Substance Use Topics  . Alcohol use: No  . Drug use: No     Allergies   Patient has no known allergies.   Review of Systems Review of Systems  Constitutional: Negative for fever.  Eyes: Positive for pain and visual disturbance.  Neurological: Negative for headaches.     Physical Exam Updated Vital Signs BP (!) 158/95 (BP Location: Left Arm)   Pulse 72   Temp 98.1 F (36.7 C) (Oral)   Resp 18   Ht 1.626 m (5\' 4" )   Wt 68 kg (150 lb)   SpO2 93%  BMI 25.75 kg/m   Physical Exam CONSTITUTIONAL: Elderly HEAD: Normocephalic/atraumatic EYES: EOMI/PERRL, no corneal haziness, no foreign bodies, no corneal abrasion, visual acuity appropriate, no proptosis, negative Seidel, ENMT: Mucous membranes moist CV: S1/S2 noted, no murmurs/rubs/gallops noted LUNGS: Lungs are clear to auscultation bilaterally, no apparent distress  NEURO: Pt is awake/alert/appropriate, moves all extremitiesx4.   EXTREMITIES:  full ROM SKIN: warm, color normal PSYCH: no abnormalities of mood noted, alert and oriented to situation   ED Treatments / Results  Labs (all labs ordered are listed, but only abnormal results are displayed) Labs Reviewed - No data to display  EKG None  Radiology No results found.  Procedures Procedures (including critical care  time)  Medications Ordered in ED Medications  erythromycin ophthalmic ointment ( Right Eye Given 12/11/17 0203)  fluorescein 1 MG ophthalmic strip (1 strip  Given 12/11/17 0203)  tetracaine (PONTOCAINE) 0.5 % ophthalmic solution 2 drop (2 drops Right Eye Given 12/11/17 0203)     Initial Impression / Assessment and Plan / ED Course  I have reviewed the triage vital signs and the nursing notes.       Eye exam unremarkable.  Will refer to ophthalmology.  Final Clinical Impressions(s) / ED Diagnoses   Final diagnoses:  Pain of right eye    ED Discharge Orders    None       Ripley Fraise, MD 12/11/17 [phone]

## 2017-12-11 NOTE — ED Triage Notes (Signed)
Pt c/o having glass in her right eye; pt states she was getting something out of a cabinet when the glass broke and some got in her eye; pt has redness and swelling to right eye; pt states she has been flushing it with saline with no relief

## 2018-01-13 DIAGNOSIS — Z0000 Encounter for general adult medical examination without abnormal findings: Secondary | ICD-10-CM | POA: Diagnosis not present

## 2018-01-13 LAB — HM DIABETES FOOT EXAM

## 2018-01-19 DIAGNOSIS — J45909 Unspecified asthma, uncomplicated: Secondary | ICD-10-CM | POA: Diagnosis not present

## 2018-01-19 DIAGNOSIS — F329 Major depressive disorder, single episode, unspecified: Secondary | ICD-10-CM | POA: Diagnosis not present

## 2018-01-19 DIAGNOSIS — E785 Hyperlipidemia, unspecified: Secondary | ICD-10-CM | POA: Diagnosis not present

## 2018-01-19 DIAGNOSIS — E669 Obesity, unspecified: Secondary | ICD-10-CM | POA: Diagnosis not present

## 2018-01-19 DIAGNOSIS — E1165 Type 2 diabetes mellitus with hyperglycemia: Secondary | ICD-10-CM | POA: Diagnosis not present

## 2018-01-19 DIAGNOSIS — M1990 Unspecified osteoarthritis, unspecified site: Secondary | ICD-10-CM | POA: Diagnosis not present

## 2018-01-19 DIAGNOSIS — N939 Abnormal uterine and vaginal bleeding, unspecified: Secondary | ICD-10-CM | POA: Diagnosis not present

## 2018-01-19 DIAGNOSIS — I10 Essential (primary) hypertension: Secondary | ICD-10-CM | POA: Diagnosis not present

## 2018-01-19 DIAGNOSIS — Z0000 Encounter for general adult medical examination without abnormal findings: Secondary | ICD-10-CM | POA: Diagnosis not present

## 2018-01-19 LAB — MICROALBUMIN, URINE: Microalb, Ur: 1.7

## 2018-06-27 DIAGNOSIS — E1165 Type 2 diabetes mellitus with hyperglycemia: Secondary | ICD-10-CM | POA: Diagnosis not present

## 2018-06-27 DIAGNOSIS — I10 Essential (primary) hypertension: Secondary | ICD-10-CM | POA: Diagnosis not present

## 2018-06-27 DIAGNOSIS — Z23 Encounter for immunization: Secondary | ICD-10-CM | POA: Diagnosis not present

## 2018-06-27 DIAGNOSIS — J45909 Unspecified asthma, uncomplicated: Secondary | ICD-10-CM | POA: Diagnosis not present

## 2018-06-27 DIAGNOSIS — F419 Anxiety disorder, unspecified: Secondary | ICD-10-CM | POA: Diagnosis not present

## 2018-09-27 ENCOUNTER — Emergency Department (HOSPITAL_COMMUNITY)
Admission: EM | Admit: 2018-09-27 | Discharge: 2018-09-27 | Disposition: A | Discharge status: Home / Self Care | Payer: Medicare Other | Attending: Emergency Medicine | Admitting: Emergency Medicine

## 2018-09-27 ENCOUNTER — Other Ambulatory Visit: Payer: Self-pay

## 2018-09-27 ENCOUNTER — Encounter (HOSPITAL_COMMUNITY): Payer: Self-pay | Admitting: Emergency Medicine

## 2018-09-27 ENCOUNTER — Emergency Department (HOSPITAL_COMMUNITY): Payer: Medicare Other

## 2018-09-27 DIAGNOSIS — Z79899 Other long term (current) drug therapy: Secondary | ICD-10-CM | POA: Insufficient documentation

## 2018-09-27 DIAGNOSIS — Y9289 Other specified places as the place of occurrence of the external cause: Secondary | ICD-10-CM | POA: Diagnosis not present

## 2018-09-27 DIAGNOSIS — X509XXA Other and unspecified overexertion or strenuous movements or postures, initial encounter: Secondary | ICD-10-CM | POA: Insufficient documentation

## 2018-09-27 DIAGNOSIS — Z7982 Long term (current) use of aspirin: Secondary | ICD-10-CM | POA: Diagnosis not present

## 2018-09-27 DIAGNOSIS — S4991XA Unspecified injury of right shoulder and upper arm, initial encounter: Secondary | ICD-10-CM | POA: Diagnosis not present

## 2018-09-27 DIAGNOSIS — Y998 Other external cause status: Secondary | ICD-10-CM | POA: Insufficient documentation

## 2018-09-27 DIAGNOSIS — E119 Type 2 diabetes mellitus without complications: Secondary | ICD-10-CM | POA: Diagnosis not present

## 2018-09-27 DIAGNOSIS — S46911A Strain of unspecified muscle, fascia and tendon at shoulder and upper arm level, right arm, initial encounter: Secondary | ICD-10-CM | POA: Insufficient documentation

## 2018-09-27 DIAGNOSIS — Y9389 Activity, other specified: Secondary | ICD-10-CM | POA: Insufficient documentation

## 2018-09-27 DIAGNOSIS — Z853 Personal history of malignant neoplasm of breast: Secondary | ICD-10-CM | POA: Insufficient documentation

## 2018-09-27 DIAGNOSIS — Z7984 Long term (current) use of oral hypoglycemic drugs: Secondary | ICD-10-CM | POA: Diagnosis not present

## 2018-09-27 DIAGNOSIS — M79601 Pain in right arm: Secondary | ICD-10-CM | POA: Diagnosis not present

## 2018-09-27 NOTE — ED Notes (Addendum)
Patient left unit without notifying staff. Patient to be discharged, patient had already left.

## 2018-09-27 NOTE — ED Provider Notes (Signed)
[facility] EMERGENCY DEPARTMENT Provider Note   CSN: [id number] Arrival date & time: 09/27/18  1701     History   Chief Complaint Chief Complaint  Patient presents with  . Shoulder Pain    HPI Amy Hoffman is a 78 y.o. female.  The history is provided by the patient and the spouse.  Shoulder Pain  Location:  Shoulder Shoulder location:  R shoulder Injury: yes   Time since incident:  1 day Mechanism of injury comment:  Pt was stuck in a muddy puddle after stepping out of her car which was parked along a ditch in a driveway. Husband and another person helped pull her out by her arms.  developed right shoulder pain last night which persists today. Pain details:    Quality:  Aching   Severity:  Moderate   Onset quality:  Gradual Handedness:  Right-handed Relieved by:  None tried Worsened by:  Movement Ineffective treatments:  None tried Associated symptoms: no fever, no neck pain, no numbness, no swelling and no tingling   Associated symptoms comment:  Denies chest pain or shortness of breath.  Also denies weakness or numbness in her arms or hands.   Past Medical History:  Diagnosis Date  . Cancer [facility])    breast cancer - right  . Diabetes mellitus without complication [facility])     Patient Active Problem List   Diagnosis Date Noted  . Numbness and tingling in hands 04/17/2014  . SOB (shortness of breath) 01/30/2014  . Breast CA ([place]) 01/30/2014  . Elevated blood pressure 01/30/2014    Past Surgical History:  Procedure Laterality Date  . MASTECTOMY Right      OB History   No obstetric history on file.      Home Medications    Prior to Admission medications   Medication Sig Start Date End Date Taking? Authorizing Provider  albuterol (PROVENTIL HFA;VENTOLIN HFA) 108 (90 BASE) MCG/ACT inhaler Inhale 1 puff into the lungs every 6 (six) hours as needed for wheezing or shortness of breath.    [provider]  anastrozole (ARIMIDEX) 1 MG tablet Take  1 mg by mouth daily.    [provider]  aspirin EC 81 MG tablet Take 81 mg by mouth daily.    [provider]  glimepiride (AMARYL) 4 MG tablet Take 4 mg by mouth every morning.     [provider]  ibuprofen (ADVIL,MOTRIN) 200 MG tablet Take 200 mg by mouth daily as needed for mild pain or moderate pain.     [provider]  metFORMIN (GLUCOPHAGE) 500 MG tablet Take 1,000 mg by mouth 2 (two) times daily.     [provider]  simvastatin (ZOCOR) 40 MG tablet Take 40 mg by mouth every evening.    [provider]  venlafaxine XR (EFFEXOR-XR) 75 MG 24 hr capsule Take 75 mg by mouth daily.    [provider]    Family History No family history on file.  Social History Social History   Tobacco Use  . Smoking status: Never Smoker  . Smokeless tobacco: Never Used  Substance Use Topics  . Alcohol use: No  . Drug use: No     Allergies   Patient has no known allergies.   Review of Systems Review of Systems  Constitutional: Negative for fever.  Musculoskeletal: Positive for arthralgias. Negative for joint swelling, myalgias and neck pain.  Neurological: Negative for weakness and numbness.     Physical Exam Updated Vital  Signs BP (!) 115/96   Pulse 78   Temp 98.4 F (36.9 C) (Oral)   Resp 16   Ht 5' 4.5" (1.638 m)   Wt 68 kg   SpO2 97%   BMI 25.35 kg/m   Physical Exam Constitutional:      Appearance: She is well-developed.  HENT:     Head: Atraumatic.  Neck:     Musculoskeletal: Normal range of motion.  Cardiovascular:     Comments: Pulses equal bilaterally Musculoskeletal:        General: Tenderness present. No swelling or deformity.     Comments: No obvious palpable dislocation of the right shoulder.  She does display range of motion of the shoulder but has increased pain with abduction and anterior flexion.  She has equal grip strength and no pain in her bicep or tricep muscles, elbow forearm wrist and  hand is nontender.  Skin:    General: Skin is warm and dry.  Neurological:     Mental Status: She is alert.     Sensory: No sensory deficit.     Deep Tendon Reflexes: Reflexes normal.      ED Treatments / Results  Labs (all labs ordered are listed, but only abnormal results are displayed) Labs Reviewed - No data to display  EKG None  Radiology Dg Shoulder Right  Result Date: 09/27/2018 CLINICAL DATA:  Recent fall with right arm pain, initial encounter EXAM: RIGHT SHOULDER - 2+ VIEW COMPARISON:  01/30/2015 FINDINGS: Degenerative changes at the acromioclavicular joint are again seen. No acute fracture or dislocation is noted. No rib abnormality is seen. No soft tissue changes are noted. IMPRESSION: Mild degenerative change without acute abnormality. Electronically Signed   By: Inez Catalina M.D.   On: 09/27/2018 18:41    Procedures Procedures (including critical care time)  Medications Ordered in ED Medications - No data to display   Initial Impression / Assessment and Plan / ED Course  I have reviewed the triage vital signs and the nursing notes.  Pertinent labs & imaging results that were available during my care of the patient were reviewed by me and considered in my medical decision making (see chart for details).    Imaging reviewed.  Patient's history and exam is suggestive of shoulder strain.  After her x-ray, she was returned to her exam room as reported by the radiology tech, however at time of discharge she was unable to be found.  The x-ray tech mentions that she was concerned about her husband who had stepped outside to have a cigarette.  RN searched in the waiting room and outdoors for the patient but she was unable to be found.  Final Clinical Impressions(s) / ED Diagnoses   Final diagnoses:  Strain of right shoulder, initial encounter    ED Discharge Orders    None       Landis Martins 09/27/18 1927    Daleen Bo, MD 09/28/18 1537

## 2018-09-27 NOTE — ED Triage Notes (Signed)
Pt c/o right shoulder pain that started last night.

## 2018-11-28 DIAGNOSIS — E1165 Type 2 diabetes mellitus with hyperglycemia: Secondary | ICD-10-CM | POA: Diagnosis not present

## 2018-11-28 DIAGNOSIS — I10 Essential (primary) hypertension: Secondary | ICD-10-CM | POA: Diagnosis not present

## 2018-11-28 DIAGNOSIS — E785 Hyperlipidemia, unspecified: Secondary | ICD-10-CM | POA: Diagnosis not present

## 2018-11-28 DIAGNOSIS — J45909 Unspecified asthma, uncomplicated: Secondary | ICD-10-CM | POA: Diagnosis not present

## 2018-11-29 DIAGNOSIS — I10 Essential (primary) hypertension: Secondary | ICD-10-CM | POA: Diagnosis not present

## 2018-11-29 DIAGNOSIS — J45909 Unspecified asthma, uncomplicated: Secondary | ICD-10-CM | POA: Diagnosis not present

## 2018-11-29 DIAGNOSIS — R413 Other amnesia: Secondary | ICD-10-CM | POA: Diagnosis not present

## 2018-11-29 DIAGNOSIS — E785 Hyperlipidemia, unspecified: Secondary | ICD-10-CM | POA: Diagnosis not present

## 2018-11-29 DIAGNOSIS — E1165 Type 2 diabetes mellitus with hyperglycemia: Secondary | ICD-10-CM | POA: Diagnosis not present

## 2018-11-29 LAB — VITAMIN B12: Vitamin B-12: 332

## 2018-11-29 LAB — LIPID PANEL
Cholesterol: 154 (ref 0–200)
HDL: 76 — AB (ref 35–70)
LDL Cholesterol: 66
Triglycerides: 41 (ref 40–160)

## 2018-12-05 ENCOUNTER — Emergency Department (HOSPITAL_COMMUNITY): Payer: Medicare Other

## 2018-12-05 ENCOUNTER — Other Ambulatory Visit: Payer: Self-pay

## 2018-12-05 ENCOUNTER — Emergency Department (HOSPITAL_COMMUNITY)
Admission: EM | Admit: 2018-12-05 | Discharge: 2018-12-05 | Disposition: A | Discharge status: Home / Self Care | Payer: Medicare Other | Attending: Emergency Medicine | Admitting: Emergency Medicine

## 2018-12-05 ENCOUNTER — Encounter (HOSPITAL_COMMUNITY): Payer: Self-pay | Admitting: Emergency Medicine

## 2018-12-05 DIAGNOSIS — M545 Low back pain, unspecified: Secondary | ICD-10-CM

## 2018-12-05 DIAGNOSIS — Z7982 Long term (current) use of aspirin: Secondary | ICD-10-CM | POA: Diagnosis not present

## 2018-12-05 DIAGNOSIS — Z7984 Long term (current) use of oral hypoglycemic drugs: Secondary | ICD-10-CM | POA: Diagnosis not present

## 2018-12-05 DIAGNOSIS — R05 Cough: Secondary | ICD-10-CM | POA: Diagnosis not present

## 2018-12-05 DIAGNOSIS — E119 Type 2 diabetes mellitus without complications: Secondary | ICD-10-CM | POA: Insufficient documentation

## 2018-12-05 DIAGNOSIS — J209 Acute bronchitis, unspecified: Secondary | ICD-10-CM | POA: Diagnosis not present

## 2018-12-05 DIAGNOSIS — Z79899 Other long term (current) drug therapy: Secondary | ICD-10-CM | POA: Insufficient documentation

## 2018-12-05 LAB — CBC WITH DIFFERENTIAL/PLATELET
Abs Immature Granulocytes: 0.03 10*3/uL (ref 0.00–0.07)
Basophils Absolute: 0.1 10*3/uL (ref 0.0–0.1)
Basophils Relative: 1 %
Eosinophils Absolute: 0 10*3/uL (ref 0.0–0.5)
Eosinophils Relative: 0 %
HCT: 40.4 % (ref 36.0–46.0)
Hemoglobin: 13.1 g/dL (ref 12.0–15.0)
Immature Granulocytes: 0 %
Lymphocytes Relative: 14 %
Lymphs Abs: 1.4 10*3/uL (ref 0.7–4.0)
MCH: 30 pg (ref 26.0–34.0)
MCHC: 32.4 g/dL (ref 30.0–36.0)
MCV: 92.7 fL (ref 80.0–100.0)
Monocytes Absolute: 1.1 10*3/uL — ABNORMAL HIGH (ref 0.1–1.0)
Monocytes Relative: 11 %
Neutro Abs: 7.5 10*3/uL (ref 1.7–7.7)
Neutrophils Relative %: 74 %
Platelets: 266 10*3/uL (ref 150–400)
RBC: 4.36 MIL/uL (ref 3.87–5.11)
RDW: 12.1 % (ref 11.5–15.5)
WBC: 10.1 10*3/uL (ref 4.0–10.5)
nRBC: 0 % (ref 0.0–0.2)

## 2018-12-05 LAB — BASIC METABOLIC PANEL
Anion gap: 9 (ref 5–15)
BUN: 13 mg/dL (ref 8–23)
CO2: 28 mmol/L (ref 22–32)
Calcium: 8.9 mg/dL (ref 8.9–10.3)
Chloride: 101 mmol/L (ref 98–111)
Creatinine, Ser: 0.69 mg/dL (ref 0.44–1.00)
GFR calc Af Amer: >60 mL/min (ref >60)
GFR calc non Af Amer: >60 mL/min (ref >60)
Glucose, Bld: 179 mg/dL — ABNORMAL HIGH (ref 70–99)
Potassium: 3.4 mmol/L — ABNORMAL LOW (ref 3.5–5.1)
Sodium: 138 mmol/L (ref 135–145)

## 2018-12-05 LAB — INFLUENZA PANEL BY PCR (TYPE A & B)
Influenza A By PCR: NEGATIVE
Influenza B By PCR: NEGATIVE

## 2018-12-05 MED ORDER — SODIUM CHLORIDE 0.9 % IV BOLUS
500.0000 mL | Freq: Once | INTRAVENOUS | Status: AC
  Administered 2018-12-05: 500 mL via INTRAVENOUS

## 2018-12-05 MED ORDER — ACETAMINOPHEN 325 MG PO TABS
650.0000 mg | ORAL_TABLET | Freq: Once | ORAL | Status: AC
  Administered 2018-12-05: 650 mg via ORAL
  Filled 2018-12-05: qty 2

## 2018-12-05 MED ORDER — DOXYCYCLINE HYCLATE 100 MG PO CAPS: 100.0000 mg | ORAL_CAPSULE | Freq: Two times a day (BID) | ORAL | 0 refills | Status: DC

## 2018-12-05 MED ORDER — DOXYCYCLINE HYCLATE 100 MG PO TABS
100.0000 mg | ORAL_TABLET | Freq: Once | ORAL | Status: AC
  Administered 2018-12-05: 100 mg via ORAL
  Filled 2018-12-05: qty 1

## 2018-12-05 MED ORDER — BENZONATATE 100 MG PO CAPS: 100.0000 mg | ORAL_CAPSULE | Freq: Three times a day (TID) | ORAL | 0 refills | Status: DC

## 2018-12-05 NOTE — Discharge Instructions (Addendum)
You are seen in the emergency department for back pain after a fall along with a cough that been going on a week.  Your vital signs were normal here and your chest x-ray did not show an obvious pneumonia.  We are treating you for bronchitis.  Please finish your antibiotics and drink plenty of fluids.  You can use Tylenol and ibuprofen for your back pain.  You will need to follow-up with your doctor to make sure you are improving.  If you feel worsening shortness of breath or other concerns please return to the emergency department.  Coronavirus (COVID-19) Are you at risk?  Are you at risk for the Coronavirus (COVID-19)?  To be considered HIGH RISK for Coronavirus (COVID-19), you have to meet the following criteria:  Traveled to [place], [place], [place], [place] or [place]; or in the [place] to [place], [place], [place], or [place]; and have fever, cough, and shortness of breath within the last 2 weeks of travel OR Been in close contact with a person diagnosed with COVID-19 within the last 2 weeks and have fever, cough, and shortness of breath IF YOU DO NOT MEET THESE CRITERIA, YOU ARE CONSIDERED LOW RISK FOR COVID-19.  What to do if you are HIGH RISK for COVID-19?  If you are having a medical emergency, call 911. Seek medical care right away. Before you go to a doctors office, urgent care or emergency department, call ahead and tell them about your recent travel, contact with someone diagnosed with COVID-19, and your symptoms. You should receive instructions from your physicians office regarding next steps of care.  When you arrive at healthcare provider, tell the healthcare staff immediately you have returned from visiting [place], [place], [place], [place] or [place]; or traveled in the [place] to [place], [place], [place], or [place]; in the last two weeks or you have been in close contact with a person diagnosed with COVID-19 in the last 2 weeks.   Tell the health care  staff about your symptoms: fever, cough and shortness of breath. After you have been seen by a medical provider, you will be either: Tested for (COVID-19) and discharged home on quarantine except to seek medical care if symptoms worsen, and asked to  Stay home and avoid contact with others until you get your results (4-5 days)  Avoid travel on public transportation if possible (such as bus, train, or airplane) or Sent to the Emergency Department by EMS for evaluation, COVID-19 testing, and possible admission depending on your condition and test results.  What to do if you are LOW RISK for COVID-19?  Reduce your risk of any infection by using the same precautions used for avoiding the common cold or flu:  Wash your hands often with soap and warm water for at least 20 seconds.  If soap and water are not readily available, use an alcohol-based hand sanitizer with at least 60% alcohol.  If coughing or sneezing, cover your mouth and nose by coughing or sneezing into the elbow areas of your shirt or coat, into a tissue or into your sleeve (not your hands). Avoid shaking hands with others and consider head nods or verbal greetings only. Avoid touching your eyes, nose, or mouth with unwashed hands.  Avoid close contact with people who are sick. Avoid places or events with large numbers of people in one location, like concerts or sporting events. Carefully consider travel plans you have or are making. If you are planning  any travel outside or inside the [place], visit the [facility] webpage for the latest health notices. If you have some symptoms but not all symptoms, continue to monitor at home and seek medical attention if your symptoms worsen. If you are having a medical emergency, call 911.   [place] / e-Visit: [email or web address]         [facility]: [place]  [facility]: [phone]                   [facility]: [phone]

## 2018-12-05 NOTE — ED Triage Notes (Addendum)
Patient complaining of fall yesterday while walking with her walker. Complaining of lower back pain. Patient ambulatory with cane at triage. Denies head injury or LOC. Patient also complaining of cough x 1 month.

## 2018-12-05 NOTE — ED Provider Notes (Signed)
[facility] Provider Note   CSN: [id number] Arrival date & time: 12/05/18  1752    History   Chief Complaint Chief Complaint  Patient presents with   Fall    HPI Amy Hoffman is a 78 y.o. female.  Patient presents emergency department complaining of not feeling well.  She says she has been coughing up some yellow phlegm for about a week.  Feeling generally fatigued.  She had a fall yesterday using her walker where she struck her back.  She has been ambulatory since then and has no numbness or weakness bowel bladder incontinence.  She denies fever but was febrile on arrival here.  No recent travel or sick contacts.  Her daughter visited her from [place] but was not sick at the time.  No chest pain no abdominal pain no nausea vomiting diarrhea.  No urinary symptoms.     The history is provided by the patient.  Fall  This is a new problem. The current episode started yesterday. The problem has been gradually improving. Pertinent negatives include no chest pain, no abdominal pain, no headaches and no shortness of breath. The symptoms are aggravated by bending and twisting. The symptoms are relieved by position. She has tried nothing for the symptoms. The treatment provided no relief.  Cough  Cough characteristics:  Productive Sputum characteristics:  Yellow Severity:  Moderate Onset quality:  Gradual Duration:  1 week Timing:  Intermittent Progression:  Unchanged Chronicity:  New Smoker: no   Relieved by:  None tried Worsened by:  Nothing Ineffective treatments:  None tried Associated symptoms: fever   Associated symptoms: no chest pain, no chills, no diaphoresis, no headaches, no myalgias, no rash, no shortness of breath, no sore throat and no wheezing   Risk factors: no recent infection and no recent travel     Past Medical History:  Diagnosis Date   Cancer ([place])    breast cancer - right   Diabetes mellitus without complication [facility])      Patient Active Problem List   Diagnosis Date Noted   Numbness and tingling in hands 04/17/2014   SOB (shortness of breath) 01/30/2014   Breast CA ([place]) 01/30/2014   Elevated blood pressure 01/30/2014    Past Surgical History:  Procedure Laterality Date   MASTECTOMY Right      OB History   No obstetric history on file.      Home Medications    Prior to Admission medications   Medication Sig Start Date End Date Taking? Authorizing Provider  albuterol (PROVENTIL HFA;VENTOLIN HFA) 108 (90 BASE) MCG/ACT inhaler Inhale 1 puff into the lungs every 6 (six) hours as needed for wheezing or shortness of breath.    [provider]  anastrozole (ARIMIDEX) 1 MG tablet Take 1 mg by mouth daily.    [provider]  aspirin EC 81 MG tablet Take 81 mg by mouth daily.    [provider]  glimepiride (AMARYL) 4 MG tablet Take 4 mg by mouth every morning.     [provider]  ibuprofen (ADVIL,MOTRIN) 200 MG tablet Take 200 mg by mouth daily as needed for mild pain or moderate pain.     [provider]  metFORMIN (GLUCOPHAGE) 500 MG tablet Take 1,000 mg by mouth 2 (two) times daily.     [provider]  simvastatin (ZOCOR) 40 MG tablet Take 40 mg by mouth every evening.    [provider]  venlafaxine XR (EFFEXOR-XR) 75 MG 24  hr capsule Take 75 mg by mouth daily.    [provider]    Family History History reviewed. No pertinent family history.  Social History Social History   Tobacco Use   Smoking status: Never Smoker   Smokeless tobacco: Never Used  Substance Use Topics   Alcohol use: No   Drug use: No     Allergies   Patient has no known allergies.   Review of Systems Review of Systems  Constitutional: Positive for fever. Negative for chills and diaphoresis.  HENT: Negative for sore throat.   Eyes: Negative for visual disturbance.  Respiratory: Positive for cough. Negative for shortness of  breath and wheezing.   Cardiovascular: Negative for chest pain.  Gastrointestinal: Negative for abdominal pain.  Genitourinary: Negative for dysuria.  Musculoskeletal: Positive for back pain. Negative for myalgias.  Skin: Negative for rash.  Neurological: Negative for headaches.     Physical Exam Updated Vital Signs BP (!) 170/68 (BP Location: Right Arm)    Pulse 92    Temp 100.3 F (37.9 C) (Oral)    Resp 18    Ht 5\' 3"  (1.6 m)    Wt 68 kg    SpO2 94%    BMI 26.57 kg/m   Physical Exam Vitals signs and nursing note reviewed.  Constitutional:      General: She is not in acute distress.    Appearance: She is well-developed.  HENT:     Head: Normocephalic and atraumatic.  Eyes:     Conjunctiva/sclera: Conjunctivae normal.  Neck:     Musculoskeletal: Neck supple.  Cardiovascular:     Rate and Rhythm: Normal rate and regular rhythm.     Heart sounds: No murmur.  Pulmonary:     Effort: Pulmonary effort is normal. No respiratory distress.     Breath sounds: Rhonchi (left base) present.  Abdominal:     Palpations: Abdomen is soft.     Tenderness: There is no abdominal tenderness.  Musculoskeletal: Normal range of motion.     Right lower leg: No edema.     Left lower leg: No edema.     Comments: She is some vague left paralumbar tenderness.  No midline tenderness no step-off.  Skin:    General: Skin is warm and dry.     Capillary Refill: Capillary refill takes less than 2 seconds.  Neurological:     General: No focal deficit present.     Mental Status: She is alert. Mental status is at baseline.     Sensory: No sensory deficit.     Motor: No weakness.      ED Treatments / Results  Labs (all labs ordered are listed, but only abnormal results are displayed) Labs Reviewed  BASIC METABOLIC PANEL - Abnormal; Notable for the following components:      Result Value   Potassium 3.4 (*)    Glucose, Bld 179 (*)    All other components within normal limits  CBC WITH  DIFFERENTIAL/PLATELET - Abnormal; Notable for the following components:   Monocytes Absolute 1.1 (*)    All other components within normal limits  INFLUENZA PANEL BY PCR (TYPE A & B)    EKG None  Radiology Dg Lumbar Spine 2-3 Views  Result Date: 12/05/2018 CLINICAL DATA:  Fall yesterday while walking with walker, low back pain. EXAM: LUMBAR SPINE - 2-3 VIEW COMPARISON:  None. FINDINGS: No acute fracture. Vertebral body heights are preserved. 8 mm anterolisthesis of L4 on L5 is likely facet mediated  with prominent facet hypertrophy. Mild broad-based levo scoliotic curvature of the lumbar spine. Diffuse disc space narrowing and endplate spurring. Facet hypertrophy at L4-L5 and L5-S1. The sacroiliac joints are congruent. 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 Grade 1/2 anterolisthesis of L4 on L5 is likely facet mediated. Electronically Signed   By: Keith Rake M.D.   On: 12/05/2018 19:09   Dg Chest Port 1 View  Result Date: 12/05/2018 CLINICAL DATA:  Cough and fever. Cough for 1 month. Patient reports fall yesterday. EXAM: PORTABLE CHEST 1 VIEW COMPARISON:  Radiograph 09/05/2014 FINDINGS: Unchanged heart size and mediastinal contours with borderline cardiomegaly and aortic atherosclerosis. Chronic blunting of the right costophrenic angle with mild elevation of right hemidiaphragm. Mild peribronchial thickening. No focal airspace disease, pleural effusion or pneumothorax. No pulmonary edema. 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 Surgical clips in the right axilla. No acute osseous abnormalities. IMPRESSION: 1. Mild peribronchial thickening suggesting acute bronchitis. No focal airspace disease. 2. Chronic elevation of right hemidiaphragm. 3.  Aortic Atherosclerosis (ICD10-I70.0). Electronically Signed   By: Keith Rake M.D.   On: 12/05/2018 19:06    Procedures Procedures (including  critical care time)  Medications Ordered in ED Medications  acetaminophen (TYLENOL) tablet 650 mg (650 mg Oral Given 12/05/18 1838)  sodium chloride 0.9 % bolus 500 mL (0 mLs Intravenous Stopped 12/05/18 2014)  doxycycline (VIBRA-TABS) tablet 100 mg (100 mg Oral Given 12/05/18 2019)     Initial Impression / Assessment and Plan / ED Course  I have reviewed the triage vital signs and the nursing notes.  Pertinent labs & imaging results that were available during my care of the patient were reviewed by me and considered in my medical decision making (see chart for details).  Clinical Course as of Dec 05 1120  Mon Dec 05, 2018  1900 Differential diagnosis includes pneumonia, bronchitis, covid, CHF, other viral syndrome.   [MB]    Clinical Course User Index [MB] Hayden Rasmussen, MD        Final Clinical Impressions(s) / ED Diagnoses   Final diagnoses:  Acute bronchitis, unspecified organism  Acute left-sided low back pain without sciatica    ED Discharge Orders         Ordered    doxycycline (VIBRAMYCIN) 100 MG capsule  2 times daily     12/05/18 2020    benzonatate (TESSALON) 100 MG capsule  Every 8 hours     12/05/18 2020           Hayden Rasmussen, MD 12/06/18 1123

## 2019-01-05 DIAGNOSIS — E1165 Type 2 diabetes mellitus with hyperglycemia: Secondary | ICD-10-CM | POA: Diagnosis not present

## 2019-01-05 DIAGNOSIS — I10 Essential (primary) hypertension: Secondary | ICD-10-CM | POA: Diagnosis not present

## 2019-01-05 DIAGNOSIS — F419 Anxiety disorder, unspecified: Secondary | ICD-10-CM | POA: Diagnosis not present

## 2019-01-05 DIAGNOSIS — F321 Major depressive disorder, single episode, moderate: Secondary | ICD-10-CM | POA: Diagnosis not present

## 2019-02-02 DIAGNOSIS — F321 Major depressive disorder, single episode, moderate: Secondary | ICD-10-CM | POA: Diagnosis not present

## 2019-02-02 DIAGNOSIS — R410 Disorientation, unspecified: Secondary | ICD-10-CM | POA: Diagnosis not present

## 2019-02-03 ENCOUNTER — Emergency Department (HOSPITAL_COMMUNITY)
Admission: EM | Admit: 2019-02-03 | Discharge: 2019-02-07 | Disposition: A | Discharge status: Other Acute Inpatient Hospital | Payer: Medicare Other | Attending: Emergency Medicine | Admitting: Emergency Medicine

## 2019-02-03 ENCOUNTER — Other Ambulatory Visit: Payer: Self-pay

## 2019-02-03 DIAGNOSIS — R0789 Other chest pain: Secondary | ICD-10-CM | POA: Diagnosis not present

## 2019-02-03 DIAGNOSIS — R079 Chest pain, unspecified: Secondary | ICD-10-CM | POA: Insufficient documentation

## 2019-02-03 DIAGNOSIS — Z79899 Other long term (current) drug therapy: Secondary | ICD-10-CM | POA: Diagnosis not present

## 2019-02-03 DIAGNOSIS — E119 Type 2 diabetes mellitus without complications: Secondary | ICD-10-CM | POA: Diagnosis not present

## 2019-02-03 DIAGNOSIS — R109 Unspecified abdominal pain: Secondary | ICD-10-CM | POA: Diagnosis not present

## 2019-02-03 DIAGNOSIS — S0990XA Unspecified injury of head, initial encounter: Secondary | ICD-10-CM | POA: Diagnosis not present

## 2019-02-03 DIAGNOSIS — F0391 Unspecified dementia with behavioral disturbance: Secondary | ICD-10-CM | POA: Diagnosis not present

## 2019-02-03 DIAGNOSIS — R4182 Altered mental status, unspecified: Secondary | ICD-10-CM | POA: Diagnosis present

## 2019-02-03 DIAGNOSIS — Z1159 Encounter for screening for other viral diseases: Secondary | ICD-10-CM | POA: Insufficient documentation

## 2019-02-03 DIAGNOSIS — Z20828 Contact with and (suspected) exposure to other viral communicable diseases: Secondary | ICD-10-CM | POA: Diagnosis not present

## 2019-02-03 DIAGNOSIS — S199XXA Unspecified injury of neck, initial encounter: Secondary | ICD-10-CM | POA: Diagnosis not present
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
## 2019-02-03 NOTE — ED Notes (Signed)
EDP at bedside prior to RN, see edp assessment for further,
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
## 2019-02-04 ENCOUNTER — Emergency Department (HOSPITAL_COMMUNITY): Payer: Medicare Other

## 2019-02-04 DIAGNOSIS — R109 Unspecified abdominal pain: Secondary | ICD-10-CM | POA: Diagnosis not present

## 2019-02-04 DIAGNOSIS — S0990XA Unspecified injury of head, initial encounter: Secondary | ICD-10-CM | POA: Diagnosis not present

## 2019-02-04 DIAGNOSIS — R079 Chest pain, unspecified: Secondary | ICD-10-CM | POA: Diagnosis not present

## 2019-02-04 DIAGNOSIS — F0391 Unspecified dementia with behavioral disturbance: Secondary | ICD-10-CM | POA: Diagnosis not present

## 2019-02-04 DIAGNOSIS — S199XXA Unspecified injury of neck, initial encounter: Secondary | ICD-10-CM | POA: Diagnosis not present

## 2019-02-04 LAB — RAPID URINE DRUG SCREEN, HOSP PERFORMED
Amphetamines: NOT DETECTED
Barbiturates: NOT DETECTED
Benzodiazepines: NOT DETECTED
Cocaine: NOT DETECTED
Opiates: NOT DETECTED
Tetrahydrocannabinol: NOT DETECTED

## 2019-02-04 LAB — COMPREHENSIVE METABOLIC PANEL
ALT: 16 U/L (ref 0–44)
AST: 14 U/L — ABNORMAL LOW (ref 15–41)
Albumin: 3.7 g/dL (ref 3.5–5.0)
Alkaline Phosphatase: 100 U/L (ref 38–126)
Anion gap: 10 (ref 5–15)
BUN: 14 mg/dL (ref 8–23)
CO2: 28 mmol/L (ref 22–32)
Calcium: 8.8 mg/dL — ABNORMAL LOW (ref 8.9–10.3)
Chloride: 103 mmol/L (ref 98–111)
Creatinine, Ser: 0.67 mg/dL (ref 0.44–1.00)
GFR calc Af Amer: >60 mL/min (ref >60)
GFR calc non Af Amer: >60 mL/min (ref >60)
Glucose, Bld: 149 mg/dL — ABNORMAL HIGH (ref 70–99)
Potassium: 3.6 mmol/L (ref 3.5–5.1)
Sodium: 141 mmol/L (ref 135–145)
Total Bilirubin: 0.6 mg/dL (ref 0.3–1.2)
Total Protein: 6.6 g/dL (ref 6.5–8.1)

## 2019-02-04 LAB — CBC WITH DIFFERENTIAL/PLATELET
Abs Immature Granulocytes: 0.02 10*3/uL (ref 0.00–0.07)
Basophils Absolute: 0 10*3/uL (ref 0.0–0.1)
Basophils Relative: 0 %
Eosinophils Absolute: 0 10*3/uL (ref 0.0–0.5)
Eosinophils Relative: 1 %
HCT: 39.1 % (ref 36.0–46.0)
Hemoglobin: 12.4 g/dL (ref 12.0–15.0)
Immature Granulocytes: 0 %
Lymphocytes Relative: 23 %
Lymphs Abs: 1.6 10*3/uL (ref 0.7–4.0)
MCH: 30.1 pg (ref 26.0–34.0)
MCHC: 31.7 g/dL (ref 30.0–36.0)
MCV: 94.9 fL (ref 80.0–100.0)
Monocytes Absolute: 0.5 10*3/uL (ref 0.1–1.0)
Monocytes Relative: 7 %
Neutro Abs: 4.7 10*3/uL (ref 1.7–7.7)
Neutrophils Relative %: 69 %
Platelets: 264 10*3/uL (ref 150–400)
RBC: 4.12 MIL/uL (ref 3.87–5.11)
RDW: 12.7 % (ref 11.5–15.5)
WBC: 6.9 10*3/uL (ref 4.0–10.5)
nRBC: 0 % (ref 0.0–0.2)

## 2019-02-04 LAB — URINALYSIS, ROUTINE W REFLEX MICROSCOPIC
Bilirubin Urine: NEGATIVE
Glucose, UA: NEGATIVE mg/dL
Hgb urine dipstick: NEGATIVE
Ketones, ur: NEGATIVE mg/dL
Nitrite: NEGATIVE
Protein, ur: NEGATIVE mg/dL
Specific Gravity, Urine: 1.014 (ref 1.005–1.030)
WBC, UA: >50 WBC/hpf — ABNORMAL HIGH (ref 0–5)
pH: 7 (ref 5.0–8.0)

## 2019-02-04 LAB — CBG MONITORING, ED
Glucose-Capillary: 119 mg/dL — ABNORMAL HIGH (ref 70–99)
Glucose-Capillary: 167 mg/dL — ABNORMAL HIGH (ref 70–99)
Glucose-Capillary: 98 mg/dL (ref 70–99)
Glucose-Capillary: 211 mg/dL — ABNORMAL HIGH (ref 70–99)

## 2019-02-04 LAB — TROPONIN I
Troponin I: <0.03 ng/mL (ref <0.03)
Troponin I: <0.03 ng/mL (ref <0.03)

## 2019-02-04 MED ORDER — IOHEXOL 300 MG/ML  SOLN
100.0000 mL | Freq: Once | INTRAMUSCULAR | Status: AC | PRN
  Administered 2019-02-04: 100 mL via INTRAVENOUS

## 2019-02-04 MED ORDER — SIMVASTATIN 10 MG PO TABS
40.0000 mg | ORAL_TABLET | Freq: Every evening | ORAL | Status: DC
  Administered 2019-02-04 – 2019-02-06 (×3): 40 mg via ORAL
  Filled 2019-02-04 (×3): qty 4

## 2019-02-04 MED ORDER — CEPHALEXIN 500 MG PO CAPS
500.0000 mg | ORAL_CAPSULE | Freq: Two times a day (BID) | ORAL | Status: DC
  Administered 2019-02-04 – 2019-02-07 (×8): 500 mg via ORAL
  Filled 2019-02-04 (×8): qty 1

## 2019-02-04 MED ORDER — INSULIN ASPART 100 UNIT/ML ~~LOC~~ SOLN
0.0000 [IU] | Freq: Three times a day (TID) | SUBCUTANEOUS | Status: DC
  Administered 2019-02-04: 2 [IU] via SUBCUTANEOUS
  Administered 2019-02-05: 1 [IU] via SUBCUTANEOUS
  Administered 2019-02-05: 2 [IU] via SUBCUTANEOUS
  Administered 2019-02-06 – 2019-02-07 (×3): 1 [IU] via SUBCUTANEOUS
  Filled 2019-02-04 (×6): qty 1

## 2019-02-04 MED ORDER — ALUM & MAG HYDROXIDE-SIMETH 200-200-20 MG/5ML PO SUSP
30.0000 mL | Freq: Once | ORAL | Status: DC
  Filled 2019-02-04: qty 30

## 2019-02-04 MED ORDER — METFORMIN HCL 500 MG PO TABS
1000.0000 mg | ORAL_TABLET | Freq: Two times a day (BID) | ORAL | Status: DC
  Administered 2019-02-04 – 2019-02-07 (×7): 1000 mg via ORAL
  Filled 2019-02-04 (×7): qty 2

## 2019-02-04 MED ORDER — ANASTROZOLE 1 MG PO TABS
1.0000 mg | ORAL_TABLET | Freq: Every day | ORAL | Status: DC
  Administered 2019-02-04 – 2019-02-07 (×4): 1 mg via ORAL
  Filled 2019-02-04 (×6): qty 1

## 2019-02-04 MED ORDER — VENLAFAXINE HCL ER 37.5 MG PO CP24
75.0000 mg | ORAL_CAPSULE | Freq: Every day | ORAL | Status: DC
  Administered 2019-02-04 – 2019-02-07 (×4): 75 mg via ORAL
  Filled 2019-02-04 (×3): qty 2

## 2019-02-04 MED ORDER — ALBUTEROL SULFATE HFA 108 (90 BASE) MCG/ACT IN AERS: 1.0000 | INHALATION_SPRAY | Freq: Four times a day (QID) | RESPIRATORY_TRACT | Status: DC | PRN

## 2019-02-04 MED ORDER — GLIMEPIRIDE 2 MG PO TABS
4.0000 mg | ORAL_TABLET | Freq: Every morning | ORAL | Status: DC
  Administered 2019-02-04 – 2019-02-07 (×4): 4 mg via ORAL
  Filled 2019-02-04 (×2): qty 1
  Filled 2019-02-04 (×3): qty 2
  Filled 2019-02-04: qty 1
  Filled 2019-02-04: qty 2

## 2019-02-04 MED ORDER — ASPIRIN EC 81 MG PO TBEC
81.0000 mg | DELAYED_RELEASE_TABLET | Freq: Every day | ORAL | Status: DC
  Administered 2019-02-04 – 2019-02-07 (×4): 81 mg via ORAL
  Filled 2019-02-04 (×4): qty 1

## 2019-02-04 NOTE — ED Notes (Signed)
Pt ambulatory to bathroom with standby assist.

## 2019-02-04 NOTE — ED Notes (Signed)
Patient transported to X-ray 
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
## 2019-02-04 NOTE — ED Notes (Signed)
PT'S son updated on plan of care,

## 2019-02-04 NOTE — ED Notes (Signed)
Pt sleeping soundly at this time.  Even rise and fall of chest noted.

## 2019-02-04 NOTE — Progress Notes (Signed)
Lindon Romp, NP recommends geropsych tx. TTS to seek placement. Pt's nurse Ginger, RN has been advised and states she will inform EDP Rancour, Annie Main, MD.  Lind Covert, MSW, LCSW Therapeutic Triage Specialist  [phone]
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
## 2019-02-04 NOTE — ED Notes (Signed)
CONTACT INFORMATION SON BRYAN  [phone]

## 2019-02-04 NOTE — ED Notes (Signed)
Pt returned from xray,  

## 2019-02-04 NOTE — ED Notes (Signed)
Pt speaking with son on phone at this time.  Seems more confused than earlier today and is wanting to go home for a little while to check on her dog.  Son aware of this prior to speaking to her.  He was provided with [place] regarding disposition planning.

## 2019-02-04 NOTE — ED Notes (Signed)
Pt's bed changed to hospital bed for comfort. Sitting in chair feeding self.  States she is sad she cannot go home today.

## 2019-02-04 NOTE — Progress Notes (Signed)
Patient meets criteria for inpatient treatment. No appropriate or available beds at [facility]. CSW faxed referrals to the following facilities for review:  [facility]  [facility]   TTS will continue to seek bed placement.  Chalmers Guest. Guerry Bruin, MSW, [place] Work/Disposition Phone: [phone] Fax: [phone]
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
## 2019-02-05 DIAGNOSIS — F0391 Unspecified dementia with behavioral disturbance: Secondary | ICD-10-CM | POA: Diagnosis not present

## 2019-02-05 LAB — CBG MONITORING, ED
Glucose-Capillary: 139 mg/dL — ABNORMAL HIGH (ref 70–99)
Glucose-Capillary: 105 mg/dL — ABNORMAL HIGH (ref 70–99)
Glucose-Capillary: 152 mg/dL — ABNORMAL HIGH (ref 70–99)
Glucose-Capillary: 109 mg/dL — ABNORMAL HIGH (ref 70–99)

## 2019-02-05 NOTE — ED Notes (Signed)
Pt ambulatory to restroom

## 2019-02-05 NOTE — Progress Notes (Signed)
CSW contacted referral facilities with the following results:  Still reviewing: [facility] (on wait list) [facility] (will call back) [facility] (still reviewing)  Declined: [facility] (at capacity) [facility] (at capacity) [facility] (at capacity) [facility] (at capacity)  TTS will continue to seek placement.   Chalmers Guest. Guerry Bruin, MSW, [place] Work/Disposition Phone: [phone] Fax: [phone]

## 2019-02-05 NOTE — BHH Counselor (Signed)
Spoke to [employer] at [employer] and noted the pt the will be reviewed by their provider if the pt is accepted she will call back.   Vertell Novak, [place], [facility], [facility] Triage Specialist [phone]

## 2019-02-05 NOTE — ED Notes (Signed)
Pt agitated and trying to ambulate unassisted into other rooms, to the desk  She is currently sitting in recliner in front of unit secretary   Listening to [place] and content currently

## 2019-02-05 NOTE — ED Notes (Signed)
Pt resting in recliner alert and oriented.
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
## 2019-02-05 NOTE — ED Notes (Signed)
Pt A&O x3; assisted pt to BR

## 2019-02-05 NOTE — BH Assessment (Signed)
[facility] called and pt is on wait list per Cat.

## 2019-02-05 NOTE — Progress Notes (Signed)
CSW received notification that [facility] had called to state that they had only gotten every other [place] of the referral information. Therefore, referral information was resent via the system.  TTS will continue to seek placement.   Chalmers Guest. Guerry Bruin, MSW, [place] Work/Disposition Phone: [phone] Fax: [phone]

## 2019-02-05 NOTE — ED Notes (Signed)
Patient denies pain and is resting comfortably.  

## 2019-02-06 DIAGNOSIS — F0391 Unspecified dementia with behavioral disturbance: Secondary | ICD-10-CM | POA: Diagnosis not present

## 2019-02-06 LAB — CBG MONITORING, ED
Glucose-Capillary: 121 mg/dL — ABNORMAL HIGH (ref 70–99)
Glucose-Capillary: 108 mg/dL — ABNORMAL HIGH (ref 70–99)
Glucose-Capillary: 126 mg/dL — ABNORMAL HIGH (ref 70–99)
Glucose-Capillary: 136 mg/dL — ABNORMAL HIGH (ref 70–99)
Glucose-Capillary: 162 mg/dL — ABNORMAL HIGH (ref 70–99)

## 2019-02-06 LAB — URINE CULTURE

## 2019-02-06 MED ORDER — HYDROXYZINE HCL 25 MG PO TABS
25.0000 mg | ORAL_TABLET | Freq: Once | ORAL | Status: AC
  Administered 2019-02-06: 25 mg via ORAL
  Filled 2019-02-06: qty 1

## 2019-02-06 NOTE — ED Notes (Signed)
Pt wandering the halls, needed to be redirected back to room by NT Tonya. MD notified.
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
## 2019-02-06 NOTE — ED Notes (Signed)
TTS in progress 

## 2019-02-06 NOTE — ED Notes (Signed)
Patient assisted with bathing, clothing changed.  Patient pleasant and cooperative.

## 2019-02-06 NOTE — ED Notes (Signed)
Patient  coming out of room, trying to wander halls.  Patient with some agitation, but able to be redirected.

## 2019-02-06 NOTE — Progress Notes (Signed)
CSW spoke with pt's daughter, [name] [phone], who requests that either she or her brother be contacted when a hospital calls with a bed offer. She states that the family's first choice is [facility] [place] or [facility]. She states that the family would, "rather her (pt) not" receive treatment at [facility].  CSW will continue to follow.  Audree Camel, LCSW, [place] Disposition [facility] BHH/TTS [phone]

## 2019-02-06 NOTE — ED Notes (Signed)
Pt given lunch tray.

## 2019-02-06 NOTE — Progress Notes (Addendum)
This patient continues to meet criteria for inpatient treatment. No appropriate or available beds at [facility]. CSW faxed referrals to the following facilities for review:  [facility]  --- Currently reviewing patient, beds are available  [facility]  [facility]  [facility] --- No gero-psych beds  Stephanie Acre, [place] Social Worker [phone]

## 2019-02-07 DIAGNOSIS — E785 Hyperlipidemia, unspecified: Secondary | ICD-10-CM | POA: Diagnosis not present

## 2019-02-07 DIAGNOSIS — F0391 Unspecified dementia with behavioral disturbance: Secondary | ICD-10-CM | POA: Diagnosis present

## 2019-02-07 DIAGNOSIS — Z9183 Wandering in diseases classified elsewhere: Secondary | ICD-10-CM | POA: Diagnosis not present

## 2019-02-07 DIAGNOSIS — G309 Alzheimer's disease, unspecified: Secondary | ICD-10-CM | POA: Diagnosis not present

## 2019-02-07 DIAGNOSIS — Z7982 Long term (current) use of aspirin: Secondary | ICD-10-CM | POA: Diagnosis not present

## 2019-02-07 DIAGNOSIS — N390 Urinary tract infection, site not specified: Secondary | ICD-10-CM | POA: Diagnosis present

## 2019-02-07 DIAGNOSIS — R079 Chest pain, unspecified: Secondary | ICD-10-CM | POA: Diagnosis not present

## 2019-02-07 DIAGNOSIS — Z1159 Encounter for screening for other viral diseases: Secondary | ICD-10-CM | POA: Diagnosis not present

## 2019-02-07 DIAGNOSIS — Z79899 Other long term (current) drug therapy: Secondary | ICD-10-CM | POA: Diagnosis not present

## 2019-02-07 DIAGNOSIS — I6782 Cerebral ischemia: Secondary | ICD-10-CM | POA: Diagnosis not present

## 2019-02-07 DIAGNOSIS — R0789 Other chest pain: Secondary | ICD-10-CM | POA: Diagnosis not present

## 2019-02-07 DIAGNOSIS — E119 Type 2 diabetes mellitus without complications: Secondary | ICD-10-CM | POA: Diagnosis not present

## 2019-02-07 DIAGNOSIS — Z79811 Long term (current) use of aromatase inhibitors: Secondary | ICD-10-CM | POA: Diagnosis not present

## 2019-02-07 DIAGNOSIS — Z7984 Long term (current) use of oral hypoglycemic drugs: Secondary | ICD-10-CM | POA: Diagnosis not present

## 2019-02-07 DIAGNOSIS — I10 Essential (primary) hypertension: Secondary | ICD-10-CM | POA: Diagnosis not present

## 2019-02-07 DIAGNOSIS — R4182 Altered mental status, unspecified: Secondary | ICD-10-CM | POA: Diagnosis not present

## 2019-02-07 DIAGNOSIS — F0281 Dementia in other diseases classified elsewhere with behavioral disturbance: Secondary | ICD-10-CM | POA: Diagnosis not present

## 2019-02-07 DIAGNOSIS — K5900 Constipation, unspecified: Secondary | ICD-10-CM | POA: Diagnosis not present

## 2019-02-07 DIAGNOSIS — R0602 Shortness of breath: Secondary | ICD-10-CM | POA: Diagnosis not present

## 2019-02-07 DIAGNOSIS — C50911 Malignant neoplasm of unspecified site of right female breast: Secondary | ICD-10-CM | POA: Diagnosis present

## 2019-02-07 LAB — SARS CORONAVIRUS 2 BY RT PCR (HOSPITAL ORDER, PERFORMED IN ~~LOC~~ HOSPITAL LAB): SARS Coronavirus 2: NEGATIVE

## 2019-02-07 LAB — CBG MONITORING, ED
Glucose-Capillary: 110 mg/dL — ABNORMAL HIGH (ref 70–99)
Glucose-Capillary: 138 mg/dL — ABNORMAL HIGH (ref 70–99)

## 2019-02-07 LAB — NOVEL CORONAVIRUS, NAA: SARS-CoV-2, NAA: NOT DETECTED

## 2019-02-07 NOTE — Progress Notes (Addendum)
Pt accepted to [facility]  Geanie Kenning, MD is the accepting/attending provider.  Call report to [phone]  Glennie Hawk [facility] notified.   Pt is Voluntary.  Pt may be transported by [facility]  Pt scheduled  to arrive at [facility] as soon as transport can be arranged.  Areatha Keas. Judi Cong, MSW, [place] Disposition Clinical Social Work [phone] (cell) [phone] (office)

## 2019-02-07 NOTE — Discharge Instructions (Addendum)
Patient been accepted to transfer to [facility].  Accepting physician is Geanie Kenning.

## 2019-02-07 NOTE — ED Notes (Signed)
[facility] arrived to transport pt to [place]. Pt taken to ED entrance by sitter.

## 2019-02-07 NOTE — ED Notes (Addendum)
Pt given lunch meal tray. Pt called to get update on patient. To this RN's knowledge, pt's daughter is POA and Amy Hoffman was contacting her with updates. Explained to husband that daughter could answer any questions he may have, but due to HIPPA, I could not give out any additional information.

## 2019-02-07 NOTE — Progress Notes (Signed)
[facility] is making a bed offer but is awaiting either POA or IVC paperwork prior to giving [place] admission information.  This Probation officer contacted daughter, [name], who is on her way to [place] to help look for POA paperwork,  Daughter will call when she gets to [place].  Areatha Keas. Judi Cong, MSW, [place] Disposition Clinical Social Work [phone] (cell) [phone] (office)'

## 2019-02-07 NOTE — ED Notes (Signed)
Pt requesting to speak with daughter. Daughter called and phone given to patient.

## 2019-02-07 NOTE — Progress Notes (Signed)
CSW received call from patient's daughter, Amy Hoffman [phone] or [phone]), asking if her mother could be referred to [facility] in [place] (apparently a cousin who is a doctor works there, or [facility].  This Probation officer explained that patient was being reviewed at [facility] today and that a referral had already been sent to [facility] but I could send it again.  Daughter explained that she spoke to [place], a staff member at [facility] who toldher they would have a bed on Wednesday or Thursday.  I explained that it would benefit patient to go to [facility] today, if they accept her.  CSW inquired as to whether patient has a POA, as she may not be capable of understanding a Voluntary Consent for Treatment in order to sign it.  Daughter stated that while patient has not been formally diagnosed with dementia, she does exhibit symptoms and is often confused and disoriented.  Daughter stated that she thought she was the POA but did not have the paperwork as it is in [place] at her parents home.  Daughter would like to go to parents home to find paperwork and will bring it to the [place].  Otherwise patient may need to be IVC'd.  Patient's nurse, Margreta Journey, notified.  Amy Hoffman. Judi Cong, MSW, [place] Disposition Clinical Social Work [phone] (cell) [phone] (office)

## 2019-02-07 NOTE — ED Notes (Signed)
Daughter arrived with POA paperwork. Faxed to [facility] and [place] per Romie Minus at [facility]. Copy made and placed in medical record bin.

## 2019-02-07 NOTE — ED Provider Notes (Signed)
Patient has been accepted to [facility] for geriatric psychiatric admission Acceptin physician is Geanie Kenning.   Fredia Sorrow, MD 02/07/19 [phone]

## 2019-02-22 DIAGNOSIS — F0391 Unspecified dementia with behavioral disturbance: Secondary | ICD-10-CM | POA: Diagnosis not present

## 2019-02-22 DIAGNOSIS — Z9183 Wandering in diseases classified elsewhere: Secondary | ICD-10-CM | POA: Diagnosis not present

## 2019-02-22 DIAGNOSIS — R270 Ataxia, unspecified: Secondary | ICD-10-CM | POA: Diagnosis not present

## 2019-02-22 DIAGNOSIS — I10 Essential (primary) hypertension: Secondary | ICD-10-CM | POA: Diagnosis not present

## 2019-02-22 DIAGNOSIS — E119 Type 2 diabetes mellitus without complications: Secondary | ICD-10-CM | POA: Diagnosis not present

## 2019-02-22 DIAGNOSIS — E785 Hyperlipidemia, unspecified: Secondary | ICD-10-CM | POA: Diagnosis not present

## 2019-02-22 DIAGNOSIS — Z7984 Long term (current) use of oral hypoglycemic drugs: Secondary | ICD-10-CM | POA: Diagnosis not present

## 2019-02-22 DIAGNOSIS — K5900 Constipation, unspecified: Secondary | ICD-10-CM | POA: Diagnosis not present

## 2019-02-23 DIAGNOSIS — Z9183 Wandering in diseases classified elsewhere: Secondary | ICD-10-CM | POA: Diagnosis not present

## 2019-02-23 DIAGNOSIS — K5900 Constipation, unspecified: Secondary | ICD-10-CM | POA: Diagnosis not present

## 2019-02-23 DIAGNOSIS — E119 Type 2 diabetes mellitus without complications: Secondary | ICD-10-CM | POA: Diagnosis not present

## 2019-02-23 DIAGNOSIS — R270 Ataxia, unspecified: Secondary | ICD-10-CM | POA: Diagnosis not present

## 2019-02-23 DIAGNOSIS — I10 Essential (primary) hypertension: Secondary | ICD-10-CM | POA: Diagnosis not present

## 2019-02-23 DIAGNOSIS — F0391 Unspecified dementia with behavioral disturbance: Secondary | ICD-10-CM | POA: Diagnosis not present

## 2019-02-28 DIAGNOSIS — R270 Ataxia, unspecified: Secondary | ICD-10-CM | POA: Diagnosis not present

## 2019-02-28 DIAGNOSIS — F0391 Unspecified dementia with behavioral disturbance: Secondary | ICD-10-CM | POA: Diagnosis not present

## 2019-02-28 DIAGNOSIS — I10 Essential (primary) hypertension: Secondary | ICD-10-CM | POA: Diagnosis not present

## 2019-02-28 DIAGNOSIS — K5900 Constipation, unspecified: Secondary | ICD-10-CM | POA: Diagnosis not present

## 2019-02-28 DIAGNOSIS — E119 Type 2 diabetes mellitus without complications: Secondary | ICD-10-CM | POA: Diagnosis not present

## 2019-02-28 DIAGNOSIS — Z9183 Wandering in diseases classified elsewhere: Secondary | ICD-10-CM | POA: Diagnosis not present

## 2019-03-02 DIAGNOSIS — E119 Type 2 diabetes mellitus without complications: Secondary | ICD-10-CM | POA: Diagnosis not present

## 2019-03-02 DIAGNOSIS — Z9183 Wandering in diseases classified elsewhere: Secondary | ICD-10-CM | POA: Diagnosis not present

## 2019-03-02 DIAGNOSIS — K5900 Constipation, unspecified: Secondary | ICD-10-CM | POA: Diagnosis not present

## 2019-03-02 DIAGNOSIS — F0391 Unspecified dementia with behavioral disturbance: Secondary | ICD-10-CM | POA: Diagnosis not present

## 2019-03-02 DIAGNOSIS — I10 Essential (primary) hypertension: Secondary | ICD-10-CM | POA: Diagnosis not present

## 2019-03-02 DIAGNOSIS — R270 Ataxia, unspecified: Secondary | ICD-10-CM | POA: Diagnosis not present

## 2019-03-07 DIAGNOSIS — R270 Ataxia, unspecified: Secondary | ICD-10-CM | POA: Diagnosis not present

## 2019-03-07 DIAGNOSIS — K5900 Constipation, unspecified: Secondary | ICD-10-CM | POA: Diagnosis not present

## 2019-03-07 DIAGNOSIS — I10 Essential (primary) hypertension: Secondary | ICD-10-CM | POA: Diagnosis not present

## 2019-03-07 DIAGNOSIS — E119 Type 2 diabetes mellitus without complications: Secondary | ICD-10-CM | POA: Diagnosis not present

## 2019-03-07 DIAGNOSIS — Z9183 Wandering in diseases classified elsewhere: Secondary | ICD-10-CM | POA: Diagnosis not present

## 2019-03-07 DIAGNOSIS — F0391 Unspecified dementia with behavioral disturbance: Secondary | ICD-10-CM | POA: Diagnosis not present

## 2019-03-09 DIAGNOSIS — K5900 Constipation, unspecified: Secondary | ICD-10-CM | POA: Diagnosis not present

## 2019-03-09 DIAGNOSIS — Z9183 Wandering in diseases classified elsewhere: Secondary | ICD-10-CM | POA: Diagnosis not present

## 2019-03-09 DIAGNOSIS — F0391 Unspecified dementia with behavioral disturbance: Secondary | ICD-10-CM | POA: Diagnosis not present

## 2019-03-09 DIAGNOSIS — I10 Essential (primary) hypertension: Secondary | ICD-10-CM | POA: Diagnosis not present

## 2019-03-09 DIAGNOSIS — R270 Ataxia, unspecified: Secondary | ICD-10-CM | POA: Diagnosis not present

## 2019-03-09 DIAGNOSIS — E119 Type 2 diabetes mellitus without complications: Secondary | ICD-10-CM | POA: Diagnosis not present

## 2019-03-13 DIAGNOSIS — I10 Essential (primary) hypertension: Secondary | ICD-10-CM | POA: Diagnosis not present

## 2019-03-13 DIAGNOSIS — N390 Urinary tract infection, site not specified: Secondary | ICD-10-CM | POA: Diagnosis not present

## 2019-03-13 DIAGNOSIS — E1165 Type 2 diabetes mellitus with hyperglycemia: Secondary | ICD-10-CM | POA: Diagnosis not present

## 2019-03-13 DIAGNOSIS — F039 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: Secondary | ICD-10-CM | POA: Diagnosis not present

## 2019-03-15 DIAGNOSIS — R270 Ataxia, unspecified: Secondary | ICD-10-CM | POA: Diagnosis not present

## 2019-03-15 DIAGNOSIS — I10 Essential (primary) hypertension: Secondary | ICD-10-CM | POA: Diagnosis not present

## 2019-03-15 DIAGNOSIS — E119 Type 2 diabetes mellitus without complications: Secondary | ICD-10-CM | POA: Diagnosis not present

## 2019-03-15 DIAGNOSIS — K5900 Constipation, unspecified: Secondary | ICD-10-CM | POA: Diagnosis not present

## 2019-03-15 DIAGNOSIS — F0391 Unspecified dementia with behavioral disturbance: Secondary | ICD-10-CM | POA: Diagnosis not present

## 2019-03-15 DIAGNOSIS — Z9183 Wandering in diseases classified elsewhere: Secondary | ICD-10-CM | POA: Diagnosis not present

## 2019-03-17 DIAGNOSIS — I10 Essential (primary) hypertension: Secondary | ICD-10-CM | POA: Diagnosis not present

## 2019-03-17 DIAGNOSIS — F0391 Unspecified dementia with behavioral disturbance: Secondary | ICD-10-CM | POA: Diagnosis not present

## 2019-03-17 DIAGNOSIS — R270 Ataxia, unspecified: Secondary | ICD-10-CM | POA: Diagnosis not present

## 2019-03-17 DIAGNOSIS — E119 Type 2 diabetes mellitus without complications: Secondary | ICD-10-CM | POA: Diagnosis not present

## 2019-03-17 DIAGNOSIS — Z9183 Wandering in diseases classified elsewhere: Secondary | ICD-10-CM | POA: Diagnosis not present

## 2019-03-17 DIAGNOSIS — K5900 Constipation, unspecified: Secondary | ICD-10-CM | POA: Diagnosis not present

## 2019-03-22 DIAGNOSIS — I10 Essential (primary) hypertension: Secondary | ICD-10-CM | POA: Diagnosis not present

## 2019-03-22 DIAGNOSIS — F0391 Unspecified dementia with behavioral disturbance: Secondary | ICD-10-CM | POA: Diagnosis not present

## 2019-03-22 DIAGNOSIS — Z9183 Wandering in diseases classified elsewhere: Secondary | ICD-10-CM | POA: Diagnosis not present

## 2019-03-22 DIAGNOSIS — E119 Type 2 diabetes mellitus without complications: Secondary | ICD-10-CM | POA: Diagnosis not present

## 2019-03-22 DIAGNOSIS — K5900 Constipation, unspecified: Secondary | ICD-10-CM | POA: Diagnosis not present

## 2019-03-22 DIAGNOSIS — R270 Ataxia, unspecified: Secondary | ICD-10-CM | POA: Diagnosis not present

## 2019-03-24 DIAGNOSIS — F0391 Unspecified dementia with behavioral disturbance: Secondary | ICD-10-CM | POA: Diagnosis not present

## 2019-03-24 DIAGNOSIS — E785 Hyperlipidemia, unspecified: Secondary | ICD-10-CM | POA: Diagnosis not present

## 2019-03-24 DIAGNOSIS — E119 Type 2 diabetes mellitus without complications: Secondary | ICD-10-CM | POA: Diagnosis not present

## 2019-03-24 DIAGNOSIS — R270 Ataxia, unspecified: Secondary | ICD-10-CM | POA: Diagnosis not present

## 2019-03-24 DIAGNOSIS — Z7984 Long term (current) use of oral hypoglycemic drugs: Secondary | ICD-10-CM | POA: Diagnosis not present

## 2019-03-24 DIAGNOSIS — Z9183 Wandering in diseases classified elsewhere: Secondary | ICD-10-CM | POA: Diagnosis not present

## 2019-03-24 DIAGNOSIS — K5900 Constipation, unspecified: Secondary | ICD-10-CM | POA: Diagnosis not present

## 2019-03-24 DIAGNOSIS — I10 Essential (primary) hypertension: Secondary | ICD-10-CM | POA: Diagnosis not present

## 2019-03-27 DIAGNOSIS — E161 Other hypoglycemia: Secondary | ICD-10-CM | POA: Diagnosis not present

## 2019-03-27 DIAGNOSIS — Z9183 Wandering in diseases classified elsewhere: Secondary | ICD-10-CM | POA: Diagnosis not present

## 2019-03-27 DIAGNOSIS — R270 Ataxia, unspecified: Secondary | ICD-10-CM | POA: Diagnosis not present

## 2019-03-27 DIAGNOSIS — F0391 Unspecified dementia with behavioral disturbance: Secondary | ICD-10-CM | POA: Diagnosis not present

## 2019-03-27 DIAGNOSIS — E119 Type 2 diabetes mellitus without complications: Secondary | ICD-10-CM | POA: Diagnosis not present

## 2019-03-27 DIAGNOSIS — I10 Essential (primary) hypertension: Secondary | ICD-10-CM | POA: Diagnosis not present

## 2019-03-27 DIAGNOSIS — R4020 Unspecified coma: Secondary | ICD-10-CM | POA: Diagnosis not present

## 2019-03-27 DIAGNOSIS — E162 Hypoglycemia, unspecified: Secondary | ICD-10-CM | POA: Diagnosis not present

## 2019-03-27 DIAGNOSIS — K5900 Constipation, unspecified: Secondary | ICD-10-CM | POA: Diagnosis not present

## 2019-03-29 DIAGNOSIS — E161 Other hypoglycemia: Secondary | ICD-10-CM | POA: Diagnosis not present

## 2019-03-29 DIAGNOSIS — E162 Hypoglycemia, unspecified: Secondary | ICD-10-CM | POA: Diagnosis not present

## 2019-03-29 DIAGNOSIS — R4182 Altered mental status, unspecified: Secondary | ICD-10-CM | POA: Diagnosis not present

## 2019-04-05 DIAGNOSIS — R270 Ataxia, unspecified: Secondary | ICD-10-CM | POA: Diagnosis not present

## 2019-04-05 DIAGNOSIS — I10 Essential (primary) hypertension: Secondary | ICD-10-CM | POA: Diagnosis not present

## 2019-04-05 DIAGNOSIS — F0391 Unspecified dementia with behavioral disturbance: Secondary | ICD-10-CM | POA: Diagnosis not present

## 2019-04-05 DIAGNOSIS — Z9183 Wandering in diseases classified elsewhere: Secondary | ICD-10-CM | POA: Diagnosis not present

## 2019-04-05 DIAGNOSIS — E119 Type 2 diabetes mellitus without complications: Secondary | ICD-10-CM | POA: Diagnosis not present

## 2019-04-05 DIAGNOSIS — K5900 Constipation, unspecified: Secondary | ICD-10-CM | POA: Diagnosis not present

## 2019-04-12 DIAGNOSIS — K5900 Constipation, unspecified: Secondary | ICD-10-CM | POA: Diagnosis not present

## 2019-04-12 DIAGNOSIS — R270 Ataxia, unspecified: Secondary | ICD-10-CM | POA: Diagnosis not present

## 2019-04-12 DIAGNOSIS — Z9183 Wandering in diseases classified elsewhere: Secondary | ICD-10-CM | POA: Diagnosis not present

## 2019-04-12 DIAGNOSIS — I10 Essential (primary) hypertension: Secondary | ICD-10-CM | POA: Diagnosis not present

## 2019-04-12 DIAGNOSIS — E119 Type 2 diabetes mellitus without complications: Secondary | ICD-10-CM | POA: Diagnosis not present

## 2019-04-12 DIAGNOSIS — F0391 Unspecified dementia with behavioral disturbance: Secondary | ICD-10-CM | POA: Diagnosis not present

## 2019-04-13 ENCOUNTER — Other Ambulatory Visit: Payer: Self-pay

## 2019-04-19 DIAGNOSIS — Z9183 Wandering in diseases classified elsewhere: Secondary | ICD-10-CM | POA: Diagnosis not present

## 2019-04-19 DIAGNOSIS — R270 Ataxia, unspecified: Secondary | ICD-10-CM | POA: Diagnosis not present

## 2019-04-19 DIAGNOSIS — K5900 Constipation, unspecified: Secondary | ICD-10-CM | POA: Diagnosis not present

## 2019-04-19 DIAGNOSIS — F0391 Unspecified dementia with behavioral disturbance: Secondary | ICD-10-CM | POA: Diagnosis not present

## 2019-04-19 DIAGNOSIS — E119 Type 2 diabetes mellitus without complications: Secondary | ICD-10-CM | POA: Diagnosis not present

## 2019-04-19 DIAGNOSIS — I10 Essential (primary) hypertension: Secondary | ICD-10-CM | POA: Diagnosis not present

## 2019-04-23 DIAGNOSIS — E119 Type 2 diabetes mellitus without complications: Secondary | ICD-10-CM | POA: Diagnosis not present

## 2019-04-23 DIAGNOSIS — Z9183 Wandering in diseases classified elsewhere: Secondary | ICD-10-CM | POA: Diagnosis not present

## 2019-04-23 DIAGNOSIS — K5900 Constipation, unspecified: Secondary | ICD-10-CM | POA: Diagnosis not present

## 2019-04-23 DIAGNOSIS — I10 Essential (primary) hypertension: Secondary | ICD-10-CM | POA: Diagnosis not present

## 2019-04-23 DIAGNOSIS — E785 Hyperlipidemia, unspecified: Secondary | ICD-10-CM | POA: Diagnosis not present

## 2019-04-23 DIAGNOSIS — F0391 Unspecified dementia with behavioral disturbance: Secondary | ICD-10-CM | POA: Diagnosis not present

## 2019-04-25 DIAGNOSIS — E1165 Type 2 diabetes mellitus with hyperglycemia: Secondary | ICD-10-CM | POA: Diagnosis not present

## 2019-04-25 DIAGNOSIS — J301 Allergic rhinitis due to pollen: Secondary | ICD-10-CM | POA: Diagnosis not present

## 2019-04-25 DIAGNOSIS — F039 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: Secondary | ICD-10-CM | POA: Diagnosis not present

## 2019-04-25 DIAGNOSIS — I10 Essential (primary) hypertension: Secondary | ICD-10-CM | POA: Diagnosis not present

## 2019-05-05 DIAGNOSIS — Z9183 Wandering in diseases classified elsewhere: Secondary | ICD-10-CM | POA: Diagnosis not present

## 2019-05-05 DIAGNOSIS — E785 Hyperlipidemia, unspecified: Secondary | ICD-10-CM | POA: Diagnosis not present

## 2019-05-05 DIAGNOSIS — I10 Essential (primary) hypertension: Secondary | ICD-10-CM | POA: Diagnosis not present

## 2019-05-05 DIAGNOSIS — F0391 Unspecified dementia with behavioral disturbance: Secondary | ICD-10-CM | POA: Diagnosis not present

## 2019-05-05 DIAGNOSIS — E119 Type 2 diabetes mellitus without complications: Secondary | ICD-10-CM | POA: Diagnosis not present

## 2019-05-05 DIAGNOSIS — K5900 Constipation, unspecified: Secondary | ICD-10-CM | POA: Diagnosis not present

## 2019-05-11 DIAGNOSIS — K5900 Constipation, unspecified: Secondary | ICD-10-CM | POA: Diagnosis not present

## 2019-05-11 DIAGNOSIS — I10 Essential (primary) hypertension: Secondary | ICD-10-CM | POA: Diagnosis not present

## 2019-05-11 DIAGNOSIS — E119 Type 2 diabetes mellitus without complications: Secondary | ICD-10-CM | POA: Diagnosis not present

## 2019-05-11 DIAGNOSIS — E785 Hyperlipidemia, unspecified: Secondary | ICD-10-CM | POA: Diagnosis not present

## 2019-05-11 DIAGNOSIS — Z9183 Wandering in diseases classified elsewhere: Secondary | ICD-10-CM | POA: Diagnosis not present

## 2019-05-11 DIAGNOSIS — F0391 Unspecified dementia with behavioral disturbance: Secondary | ICD-10-CM | POA: Diagnosis not present

## 2019-05-12 DIAGNOSIS — R0902 Hypoxemia: Secondary | ICD-10-CM | POA: Diagnosis not present

## 2019-05-12 DIAGNOSIS — R0689 Other abnormalities of breathing: Secondary | ICD-10-CM | POA: Diagnosis not present

## 2019-05-12 DIAGNOSIS — N390 Urinary tract infection, site not specified: Secondary | ICD-10-CM | POA: Diagnosis not present

## 2019-05-12 DIAGNOSIS — R531 Weakness: Secondary | ICD-10-CM | POA: Diagnosis not present

## 2019-05-17 DIAGNOSIS — F0391 Unspecified dementia with behavioral disturbance: Secondary | ICD-10-CM | POA: Diagnosis not present

## 2019-05-17 DIAGNOSIS — K5900 Constipation, unspecified: Secondary | ICD-10-CM | POA: Diagnosis not present

## 2019-05-17 DIAGNOSIS — E119 Type 2 diabetes mellitus without complications: Secondary | ICD-10-CM | POA: Diagnosis not present

## 2019-05-17 DIAGNOSIS — Z9183 Wandering in diseases classified elsewhere: Secondary | ICD-10-CM | POA: Diagnosis not present

## 2019-05-17 DIAGNOSIS — I10 Essential (primary) hypertension: Secondary | ICD-10-CM | POA: Diagnosis not present

## 2019-05-17 DIAGNOSIS — E785 Hyperlipidemia, unspecified: Secondary | ICD-10-CM | POA: Diagnosis not present

## 2019-05-23 DIAGNOSIS — I10 Essential (primary) hypertension: Secondary | ICD-10-CM | POA: Diagnosis not present

## 2019-05-23 DIAGNOSIS — F0391 Unspecified dementia with behavioral disturbance: Secondary | ICD-10-CM | POA: Diagnosis not present

## 2019-05-23 DIAGNOSIS — Z9183 Wandering in diseases classified elsewhere: Secondary | ICD-10-CM | POA: Diagnosis not present

## 2019-05-23 DIAGNOSIS — E119 Type 2 diabetes mellitus without complications: Secondary | ICD-10-CM | POA: Diagnosis not present

## 2019-05-23 DIAGNOSIS — K5900 Constipation, unspecified: Secondary | ICD-10-CM | POA: Diagnosis not present

## 2019-05-23 DIAGNOSIS — E785 Hyperlipidemia, unspecified: Secondary | ICD-10-CM | POA: Diagnosis not present

## 2019-06-07 DIAGNOSIS — Z23 Encounter for immunization: Secondary | ICD-10-CM | POA: Diagnosis not present

## 2019-06-07 DIAGNOSIS — R1310 Dysphagia, unspecified: Secondary | ICD-10-CM | POA: Diagnosis not present

## 2019-06-07 DIAGNOSIS — I10 Essential (primary) hypertension: Secondary | ICD-10-CM | POA: Diagnosis not present

## 2019-06-07 DIAGNOSIS — E119 Type 2 diabetes mellitus without complications: Secondary | ICD-10-CM | POA: Diagnosis not present

## 2019-06-07 DIAGNOSIS — F039 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: Secondary | ICD-10-CM | POA: Diagnosis not present

## 2019-06-08 DIAGNOSIS — I10 Essential (primary) hypertension: Secondary | ICD-10-CM | POA: Diagnosis not present

## 2019-06-08 DIAGNOSIS — E119 Type 2 diabetes mellitus without complications: Secondary | ICD-10-CM | POA: Diagnosis not present

## 2019-06-08 DIAGNOSIS — F039 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: Secondary | ICD-10-CM | POA: Diagnosis not present

## 2019-06-08 LAB — BASIC METABOLIC PANEL
BUN: 11 (ref 4–21)
CO2: 30 — AB (ref 13–22)
Chloride: 104 (ref 99–108)
Creatinine: 0.8 (ref 0.5–1.1)
Glucose: 204
Potassium: 4.7 (ref 3.4–5.3)
Sodium: 141 (ref 137–147)

## 2019-06-08 LAB — HEMOGLOBIN A1C: Hemoglobin A1C: 7.2

## 2019-06-08 LAB — COMPREHENSIVE METABOLIC PANEL: Calcium: 8.8 (ref 8.7–10.7)

## 2019-06-15 ENCOUNTER — Encounter: Payer: Self-pay | Admitting: Internal Medicine

## 2019-06-21 DIAGNOSIS — F0391 Unspecified dementia with behavioral disturbance: Secondary | ICD-10-CM | POA: Diagnosis not present

## 2019-06-21 DIAGNOSIS — E785 Hyperlipidemia, unspecified: Secondary | ICD-10-CM | POA: Diagnosis not present

## 2019-06-21 DIAGNOSIS — K5900 Constipation, unspecified: Secondary | ICD-10-CM | POA: Diagnosis not present

## 2019-06-21 DIAGNOSIS — E119 Type 2 diabetes mellitus without complications: Secondary | ICD-10-CM | POA: Diagnosis not present

## 2019-06-21 DIAGNOSIS — Z9183 Wandering in diseases classified elsewhere: Secondary | ICD-10-CM | POA: Diagnosis not present

## 2019-06-21 DIAGNOSIS — I10 Essential (primary) hypertension: Secondary | ICD-10-CM | POA: Diagnosis not present

## 2019-07-10 ENCOUNTER — Other Ambulatory Visit: Payer: Self-pay

## 2019-07-10 ENCOUNTER — Encounter: Payer: Self-pay | Admitting: Gastroenterology

## 2019-07-10 ENCOUNTER — Ambulatory Visit (INDEPENDENT_AMBULATORY_CARE_PROVIDER_SITE_OTHER): Payer: Medicare Other | Admitting: Gastroenterology

## 2019-07-10 ENCOUNTER — Encounter: Payer: Self-pay | Admitting: *Deleted

## 2019-07-10 VITALS — BP 138/75 | HR 78 | Temp 97.1°F | Ht 68.0 in | Wt 138.0 lb

## 2019-07-10 DIAGNOSIS — R1310 Dysphagia, unspecified: Secondary | ICD-10-CM | POA: Insufficient documentation

## 2019-07-10 DIAGNOSIS — R634 Abnormal weight loss: Secondary | ICD-10-CM | POA: Insufficient documentation
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
## 2019-07-10 NOTE — Assessment & Plan Note (Signed)
78 year old female presenting with issues of dysphagia.  History very difficult given patient's baseline dementia.  Husband states she complains when he is trying to give her her pills.  He feels like she is eating well.  No recent weight loss.  We discussed pursuing barium pill esophagram initially since we do not have a clear-cut history of solid food dysphagia today.  If she is documented to have any esophageal narrowing or other significant abnormalities then of course we would offer her an upper endoscopy.  Both patient and spouse in agreement with plan.  We do not have a current medication list today.  We have contacted the pharmacy but waiting records.  Patient spouse that he would bring by a copy of her medications.  We will obtain a copy of recent labs for review as well.

## 2019-07-10 NOTE — Patient Instructions (Signed)
1. We will obtain copy of labs from Dr. Luan Pulling for review.  2. Xray of your esophagus as planned.  3. Please bring by a copy of medications.

## 2019-07-10 NOTE — Progress Notes (Signed)
meds updated per [facility] list. Amaryl not listed. Unclear if she is on it for her DM. Patient's spouse to bring current med list.

## 2019-07-14 ENCOUNTER — Ambulatory Visit (HOSPITAL_COMMUNITY)
Admission: RE | Admit: 2019-07-14 | Discharge: 2019-07-14 | Disposition: A | Discharge status: Home / Self Care | Payer: Medicare Other | Source: Ambulatory Visit | Attending: Gastroenterology | Admitting: Gastroenterology

## 2019-07-14 ENCOUNTER — Other Ambulatory Visit: Payer: Self-pay

## 2019-07-14 DIAGNOSIS — K224 Dyskinesia of esophagus: Secondary | ICD-10-CM | POA: Diagnosis not present

## 2019-07-14 DIAGNOSIS — R634 Abnormal weight loss: Secondary | ICD-10-CM

## 2019-07-14 DIAGNOSIS — R1310 Dysphagia, unspecified: Secondary | ICD-10-CM

## 2019-07-17 ENCOUNTER — Telehealth: Payer: Self-pay | Admitting: Gastroenterology

## 2019-07-17 NOTE — Telephone Encounter (Signed)
Labs from June 08, 2019: Glucose 254, creatinine 0.8, A1c 7.2

## 2019-08-09 ENCOUNTER — Other Ambulatory Visit: Payer: Self-pay

## 2019-08-16 DIAGNOSIS — J301 Allergic rhinitis due to pollen: Secondary | ICD-10-CM

## 2019-08-16 DIAGNOSIS — I10 Essential (primary) hypertension: Secondary | ICD-10-CM

## 2019-08-16 DIAGNOSIS — E669 Obesity, unspecified: Secondary | ICD-10-CM

## 2019-08-16 DIAGNOSIS — E785 Hyperlipidemia, unspecified: Secondary | ICD-10-CM

## 2019-08-16 DIAGNOSIS — F329 Major depressive disorder, single episode, unspecified: Secondary | ICD-10-CM

## 2019-08-16 DIAGNOSIS — M25519 Pain in unspecified shoulder: Secondary | ICD-10-CM

## 2019-08-16 DIAGNOSIS — E1165 Type 2 diabetes mellitus with hyperglycemia: Secondary | ICD-10-CM

## 2019-08-16 DIAGNOSIS — F039 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: Secondary | ICD-10-CM

## 2019-08-16 DIAGNOSIS — J45909 Unspecified asthma, uncomplicated: Secondary | ICD-10-CM

## 2019-08-16 DIAGNOSIS — E119 Type 2 diabetes mellitus without complications: Secondary | ICD-10-CM | POA: Insufficient documentation

## 2019-08-16 DIAGNOSIS — M1990 Unspecified osteoarthritis, unspecified site: Secondary | ICD-10-CM

## 2019-08-16 DIAGNOSIS — J45901 Unspecified asthma with (acute) exacerbation: Secondary | ICD-10-CM

## 2019-08-16 DIAGNOSIS — N939 Abnormal uterine and vaginal bleeding, unspecified: Secondary | ICD-10-CM

## 2019-11-03 DIAGNOSIS — Z23 Encounter for immunization: Secondary | ICD-10-CM | POA: Diagnosis not present

## 2019-12-01 DIAGNOSIS — Z23 Encounter for immunization: Secondary | ICD-10-CM | POA: Diagnosis not present

## 2019-12-15 ENCOUNTER — Emergency Department (HOSPITAL_COMMUNITY)
Admission: EM | Admit: 2019-12-15 | Discharge: 2019-12-15 | Disposition: A | Discharge status: Home / Self Care | Payer: Medicare Other | Source: Home / Self Care | Attending: Emergency Medicine | Admitting: Emergency Medicine

## 2019-12-15 ENCOUNTER — Other Ambulatory Visit: Payer: Self-pay

## 2019-12-15 ENCOUNTER — Encounter (HOSPITAL_COMMUNITY): Payer: Self-pay | Admitting: *Deleted

## 2019-12-15 ENCOUNTER — Ambulatory Visit (INDEPENDENT_AMBULATORY_CARE_PROVIDER_SITE_OTHER)
Admission: EM | Admit: 2019-12-15 | Discharge: 2019-12-15 | Disposition: A | Discharge status: Home / Self Care | Payer: Medicare Other | Source: Home / Self Care

## 2019-12-15 DIAGNOSIS — R197 Diarrhea, unspecified: Secondary | ICD-10-CM | POA: Diagnosis not present

## 2019-12-15 DIAGNOSIS — Z7984 Long term (current) use of oral hypoglycemic drugs: Secondary | ICD-10-CM | POA: Insufficient documentation

## 2019-12-15 DIAGNOSIS — Z20822 Contact with and (suspected) exposure to covid-19: Secondary | ICD-10-CM | POA: Diagnosis not present

## 2019-12-15 DIAGNOSIS — R0602 Shortness of breath: Secondary | ICD-10-CM | POA: Diagnosis not present

## 2019-12-15 DIAGNOSIS — Z79899 Other long term (current) drug therapy: Secondary | ICD-10-CM | POA: Insufficient documentation

## 2019-12-15 DIAGNOSIS — R5383 Other fatigue: Secondary | ICD-10-CM

## 2019-12-15 DIAGNOSIS — Z5329 Procedure and treatment not carried out because of patient's decision for other reasons: Secondary | ICD-10-CM | POA: Insufficient documentation

## 2019-12-15 DIAGNOSIS — E1165 Type 2 diabetes mellitus with hyperglycemia: Secondary | ICD-10-CM

## 2019-12-15 DIAGNOSIS — I10 Essential (primary) hypertension: Secondary | ICD-10-CM | POA: Insufficient documentation

## 2019-12-15 DIAGNOSIS — Z66 Do not resuscitate: Secondary | ICD-10-CM | POA: Diagnosis not present

## 2019-12-15 DIAGNOSIS — F039 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: Secondary | ICD-10-CM | POA: Insufficient documentation

## 2019-12-15 DIAGNOSIS — J189 Pneumonia, unspecified organism: Secondary | ICD-10-CM | POA: Diagnosis not present

## 2019-12-15 DIAGNOSIS — Z9189 Other specified personal risk factors, not elsewhere classified: Secondary | ICD-10-CM

## 2019-12-15 DIAGNOSIS — F0391 Unspecified dementia with behavioral disturbance: Secondary | ICD-10-CM | POA: Diagnosis not present

## 2019-12-15 DIAGNOSIS — J9601 Acute respiratory failure with hypoxia: Secondary | ICD-10-CM | POA: Diagnosis not present

## 2019-12-15 DIAGNOSIS — R739 Hyperglycemia, unspecified: Secondary | ICD-10-CM

## 2019-12-15 DIAGNOSIS — R05 Cough: Secondary | ICD-10-CM | POA: Diagnosis not present

## 2019-12-15 DIAGNOSIS — J45901 Unspecified asthma with (acute) exacerbation: Secondary | ICD-10-CM | POA: Diagnosis not present

## 2019-12-15 LAB — CBG MONITORING, ED: Glucose-Capillary: 215 mg/dL — ABNORMAL HIGH (ref 70–99)

## 2019-12-15 LAB — POCT FASTING CBG KUC MANUAL ENTRY: POCT Glucose (KUC): 180 mg/dL — AB (ref 70–99)

## 2019-12-15 NOTE — ED Triage Notes (Signed)
Patient sent over by urgent care for excessive sleepiness, diarrhea, and hyperglycemia per caregiver for the last 3 days.  Patient caregiver states she is on metformin" if she needs" it and has not had a change in medications.

## 2019-12-15 NOTE — ED Provider Notes (Addendum)
[facility]    CSN: [id number] Arrival date & time: 12/15/19  1140      History   Chief Complaint Chief Complaint  Patient presents with  . Hyperglycemia    HPI Amy Hoffman is a 79 y.o. female with significant medical history as outlined below including uncontrolled type 2 diabetes presenting with caregivers for concern of fatigue.  Caregiver from first provide history: States patient has been sleeping almost every day for the last few days, soiling herself, and having higher sugars than normal.  Caregivers do not seem to live with patient, though history is compromised second to caregivers being hard of hearing/poor historians. Unable to provide history herself given dementia.  Patient denying chest pain, difficulty breathing, nausea or abdominal pain in office today.   Past Medical History:  Diagnosis Date  . Abnormal uterine and vaginal bleeding, unspecified   . Allergic rhinitis due to pollen   . Anxiety   . Asthma   . Cancer [facility])    breast cancer - right  . Dementia ([place])   . Depression   . Depression   . Diabetes mellitus without complication ([place])   . Essential (primary) hypertension   . Hyperlipidemia, unspecified   . Hypertension   . Major depressive disorder, single episode, unspecified   . Obesity, unspecified   . Pain in joint involving shoulder region   . Reflux esophagitis   . Trigger finger, acquired   . Type 2 diabetes mellitus with hyperglycemia ([place])   . Unspecified asthma with (acute) exacerbation   . Unspecified asthma, uncomplicated   . 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 ([place])   . Unspecified osteoarthritis, unspecified site     Patient Active Problem List   Diagnosis Date Noted  . Hyperlipidemia, unspecified   . Obesity, unspecified   . Type 2 diabetes mellitus with hyperglycemia ([place])   . Abnormal uterine and vaginal bleeding, unspecified   . Allergic rhinitis due to pollen   . Essential (primary)  hypertension   . Major depressive disorder, single episode, unspecified   . Unspecified asthma with (acute) exacerbation   . Unspecified asthma, uncomplicated   . 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 ([place])   . Unspecified osteoarthritis, unspecified site   . Pain in joint involving shoulder region   . Dysphagia 07/10/2019  . Loss of weight 07/10/2019  . Numbness and tingling in hands 04/17/2014  . SOB (shortness of breath) 01/30/2014  . Breast CA ([place]) 01/30/2014  . Elevated blood pressure 01/30/2014    Past Surgical History:  Procedure Laterality Date  . CHOLECYSTECTOMY    . MASTECTOMY Right   . RE-EXCISION OF BREAST LUMPECTOMY      OB History   No obstetric history on file.      Home Medications    Prior to Admission medications   Medication Sig Start Date End Date Taking? Authorizing Provider  amLODipine (NORVASC) 5 MG tablet Take 5 mg by mouth daily. 12/26/18  Yes [provider]  anastrozole (ARIMIDEX) 1 MG tablet Take 1 mg by mouth daily.   Yes [provider]  aspirin 81 MG chewable tablet Chew by mouth daily.   Yes [provider]  donepezil (ARICEPT) 5 MG tablet Take 5 mg by mouth at bedtime.   Yes [provider]  risperiDONE (RISPERDAL) 0.5 MG tablet Take 0.5 mg by mouth 2 (two) times daily.   Yes [provider]  simvastatin (ZOCOR) 40 MG tablet Take 40 mg by mouth every evening.  Yes [provider]  venlafaxine XR (EFFEXOR-XR) 150 MG 24 hr capsule Take 150 mg by mouth daily with breakfast.   Yes [provider]  venlafaxine XR (EFFEXOR-XR) 75 MG 24 hr capsule Take 75 mg by mouth daily with breakfast.   Yes [provider]  glimepiride (AMARYL) 4 MG tablet Take 8 mg by mouth every morning.     [provider]    Family History Family History  Family history unknown: Yes    Social History Social History   Tobacco Use  . Smoking status: Never Smoker  .  Smokeless tobacco: Never Used  Substance Use Topics  . Alcohol use: No  . Drug use: No     Allergies   Codeine and Zantac [ranitidine hcl]   Review of Systems As per HPI   Physical Exam Triage Vital Signs ED Triage Vitals  Enc Vitals Group     BP      Pulse      Resp      Temp      Temp src      SpO2      Weight      Height      Head Circumference      Peak Flow      Pain Score      Pain Loc      Pain Edu?      Excl. in [place]?    No data found.  Updated Vital Signs BP (!) 146/83   Pulse 91   Temp 98.6 F (37 C)   Resp 18   SpO2 93%   Visual Acuity Right Eye Distance:   Left Eye Distance:   Bilateral Distance:    Right Eye Near:   Left Eye Near:    Bilateral Near:     Physical Exam Constitutional:      General: She is not in acute distress.    Appearance: She is normal weight. She is not ill-appearing.  HENT:     Head: Normocephalic and atraumatic.     Right Ear: Tympanic membrane, ear canal and external ear normal.     Left Ear: Tympanic membrane, ear canal and external ear normal.     Mouth/Throat:     Mouth: Mucous membranes are moist.     Pharynx: Oropharynx is clear. No posterior oropharyngeal erythema.  Eyes:     General: No scleral icterus.    Conjunctiva/sclera: Conjunctivae normal.     Pupils: Pupils are equal, round, and reactive to light.  Cardiovascular:     Rate and Rhythm: Normal rate and regular rhythm.  Pulmonary:     Effort: Pulmonary effort is normal.  Musculoskeletal:     Cervical back: Neck supple. No tenderness.     Right lower leg: No edema.     Left lower leg: No edema.  Lymphadenopathy:     Cervical: No cervical adenopathy.  Skin:    Coloration: Skin is not jaundiced or pale.  Neurological:     Mental Status: She is alert and oriented to person, place, and time.      UC Treatments / Results  Labs (all labs ordered are listed, but only abnormal results are displayed) Labs Reviewed  POCT FASTING CBG [facility] MANUAL  ENTRY - Abnormal; Notable for the following components:      Result Value   POCT Glucose ([facility]) 180 (*)    All other components within normal limits    EKG   Radiology No results found.  Procedures Procedures (including critical care time)  Medications Ordered in UC Medications - No data to display  Initial Impression / Assessment and Plan / UC Course  I have reviewed the triage vital signs and the nursing notes.  Pertinent labs & imaging results that were available during my care of the patient were reviewed by me and considered in my medical decision making (see chart for details).     Patient afebrile, nontoxic, hemodynamically stable in office today.  Patient last ate last night: CBG 180.  Readings greater than 290 this morning, then caregiver gave "an extra pill".  This seems to have been Metformin, the caregiver is unsure if it was 1 or 2 pills total.  Patient allegedly receives Metformin as needed.  Was concerned the patient may have too much Metformin in her system, concern for hypoglycemic event later today given no p.o. intake with unknown dose of Metformin.  Given poor history, medication noncompliance, age and comorbidities, referred patient to ER for further evaluation of fatigue, hyperglycemia, diarrhea.  Patient transported by caregivers in stable condition to [facility]. Final Clinical Impressions(s) / UC Diagnoses   Final diagnoses:  Uncontrolled type 2 diabetes mellitus with hyperglycemia ([place])  At risk for medication noncompliance  Other fatigue  Diarrhea, unspecified type   Discharge Instructions   None    ED Prescriptions    None     PDMP not reviewed this encounter.   Hall-Potvin, [place], PA-C 12/15/19 1504    Hall-Potvin, [place], [place] 12/15/19 1505

## 2019-12-15 NOTE — ED Provider Notes (Signed)
[facility] EMERGENCY DEPARTMENT Provider Note   CSN: [id number] Arrival date & time: 12/15/19  1433     History Chief Complaint  Patient presents with  . Hyperglycemia    Amy Hoffman is a 79 y.o. female with a history of asthma, DM, dementia, HTN presenting for evaluation of a 3 day history of increased sleepiness, diarrhea and hyperglycemia per triage note.  When asked if she has any symptoms, she reports "No", I feel well.  She was asked why her family brought her in, she replied, "I guess it was because I felt happy".  No family currently present.   Call placed to husbands cell phone, which he was in the parking lot.  Asked him for more details about his wife's symptoms.  He responded "can't the caregiver give that information?".  Upon walking back toward the room the caregiver was now present, but was ambulating patient down the hall.  Pt and caregiver refusing to stay for further evaluation, over complaint this was taking too long.    The history is provided by the patient and the spouse.       Past Medical History:  Diagnosis Date  . Abnormal uterine and vaginal bleeding, unspecified   . Allergic rhinitis due to pollen   . Anxiety   . Asthma   . Cancer [facility])    breast cancer - right  . Dementia ([place])   . Depression   . Depression   . Diabetes mellitus without complication ([place])   . Essential (primary) hypertension   . Hyperlipidemia, unspecified   . Hypertension   . Major depressive disorder, single episode, unspecified   . Obesity, unspecified   . Pain in joint involving shoulder region   . Reflux esophagitis   . Trigger finger, acquired   . Type 2 diabetes mellitus with hyperglycemia ([place])   . Unspecified asthma with (acute) exacerbation   . Unspecified asthma, uncomplicated   . 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 ([place])   . Unspecified osteoarthritis, unspecified site     Patient Active Problem List   Diagnosis Date Noted  .  Hyperlipidemia, unspecified   . Obesity, unspecified   . Type 2 diabetes mellitus with hyperglycemia ([place])   . Abnormal uterine and vaginal bleeding, unspecified   . Allergic rhinitis due to pollen   . Essential (primary) hypertension   . Major depressive disorder, single episode, unspecified   . Unspecified asthma with (acute) exacerbation   . Unspecified asthma, uncomplicated   . 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 ([place])   . Unspecified osteoarthritis, unspecified site   . Pain in joint involving shoulder region   . Dysphagia 07/10/2019  . Loss of weight 07/10/2019  . Numbness and tingling in hands 04/17/2014  . SOB (shortness of breath) 01/30/2014  . Breast CA ([place]) 01/30/2014  . Elevated blood pressure 01/30/2014    Past Surgical History:  Procedure Laterality Date  . CHOLECYSTECTOMY    . MASTECTOMY Right   . RE-EXCISION OF BREAST LUMPECTOMY       OB History   No obstetric history on file.     Family History  Family history unknown: Yes    Social History   Tobacco Use  . Smoking status: Never Smoker  . Smokeless tobacco: Never Used  Substance Use Topics  . Alcohol use: No  . Drug use: No    Home Medications Prior to Admission medications   Medication Sig Start Date End Date Taking? Authorizing Provider  amLODipine (NORVASC) 5 MG  tablet Take 5 mg by mouth daily. 12/26/18   [provider]  anastrozole (ARIMIDEX) 1 MG tablet Take 1 mg by mouth daily.    [provider]  aspirin 81 MG chewable tablet Chew by mouth daily.    [provider]  donepezil (ARICEPT) 5 MG tablet Take 5 mg by mouth at bedtime.    [provider]  glimepiride (AMARYL) 4 MG tablet Take 8 mg by mouth every morning.     [provider]  risperiDONE (RISPERDAL) 0.5 MG tablet Take 0.5 mg by mouth 2 (two) times daily.    [provider]  simvastatin (ZOCOR) 40 MG tablet Take 40 mg by mouth every evening.    [provider]  venlafaxine XR (EFFEXOR-XR) 150 MG 24 hr capsule Take 150 mg by mouth daily with breakfast.    [provider]  venlafaxine XR (EFFEXOR-XR) 75 MG 24 hr capsule Take 75 mg by mouth daily with breakfast.    [provider]    Allergies    Codeine and Zantac [ranitidine hcl]  Review of Systems   Review of Systems  Unable to perform ROS: Dementia    Physical Exam Updated Vital Signs BP (!) 152/66 (BP Location: Right Arm)   Pulse (!) 102   Temp 99.8 F (37.7 C) (Oral)   Resp 17   Ht 5\' 5"  (1.651 m)   Wt 59 kg   SpO2 96%   BMI 21.63 kg/m   Physical Exam Vitals and nursing note reviewed.  Constitutional:      Appearance: She is well-developed.  HENT:     Head: Normocephalic and atraumatic.     Mouth/Throat:     Mouth: Mucous membranes are moist.  Eyes:     Conjunctiva/sclera: Conjunctivae normal.  Cardiovascular:     Rate and Rhythm: Regular rhythm. Tachycardia present.     Heart sounds: Normal heart sounds.     Comments: Borderline tachy at 102.  Pulmonary:     Effort: Pulmonary effort is normal.     Breath sounds: Normal breath sounds. No wheezing.  Abdominal:     General: Bowel sounds are normal.     Palpations: Abdomen is soft.     Tenderness: There is abdominal tenderness. There is no guarding or rebound.     Comments: Soft distention without guarding or rebound.   Musculoskeletal:        General: Normal range of motion.     Cervical back: Normal range of motion.     Right lower leg: Edema present.     Left lower leg: Edema present.  Skin:    General: Skin is warm and dry.  Neurological:     Mental Status: She is alert.     ED Results / Procedures / Treatments   Labs (all labs ordered are listed, but only abnormal results are displayed) Labs Reviewed  CBG MONITORING, ED - Abnormal; Notable for the following components:      Result Value   Glucose-Capillary 215 (*)    All other components within normal limits     EKG None  Radiology No results found.  Procedures Procedures (including critical care time)  Medications Ordered in ED Medications - No data to display  ED Course  I have reviewed the triage vital signs and the nursing notes.  Pertinent labs & imaging results that were available during my care of the patient were reviewed by me and considered in my medical decision making (see chart for  details).    MDM Rules/Calculators/A&P                      Pt with multiple complaints, primary being hyperglycemia, cbg here 215.  She and caregiver, husband refused further evaluation or testing.   Pt signed out AMA.     Final Clinical Impression(s) / ED Diagnoses Final diagnoses:  Hyperglycemia    Rx / DC Orders ED Discharge Orders    None       Landis Martins 12/15/19 1612    Milton Ferguson, MD 12/15/19 1642

## 2019-12-15 NOTE — ED Triage Notes (Signed)
Pt presents with complaints of hyperglycemia. Reports she has been excessively sleepy. Blood sugars have been between 200 and 300 at home. The patient takes metformin at home for blood sugar. Reports she is currently looking for a pcp.

## 2019-12-16 ENCOUNTER — Other Ambulatory Visit: Payer: Self-pay

## 2019-12-16 ENCOUNTER — Inpatient Hospital Stay (HOSPITAL_COMMUNITY)
Admission: EM | Admit: 2019-12-16 | Discharge: 2019-12-22 | DRG: 193 | Disposition: A | Discharge status: Skilled Nursing Facility | Payer: Medicare Other | Source: Ambulatory Visit | Attending: Internal Medicine | Admitting: Internal Medicine

## 2019-12-16 ENCOUNTER — Encounter (HOSPITAL_COMMUNITY): Payer: Self-pay | Admitting: *Deleted

## 2019-12-16 ENCOUNTER — Emergency Department (HOSPITAL_COMMUNITY): Payer: Medicare Other

## 2019-12-16 DIAGNOSIS — J4520 Mild intermittent asthma, uncomplicated: Secondary | ICD-10-CM | POA: Diagnosis not present

## 2019-12-16 DIAGNOSIS — F03918 Unspecified dementia, unspecified severity, with other behavioral disturbance: Secondary | ICD-10-CM

## 2019-12-16 DIAGNOSIS — Z9049 Acquired absence of other specified parts of digestive tract: Secondary | ICD-10-CM

## 2019-12-16 DIAGNOSIS — Z20822 Contact with and (suspected) exposure to covid-19: Secondary | ICD-10-CM | POA: Diagnosis present

## 2019-12-16 DIAGNOSIS — I119 Hypertensive heart disease without heart failure: Secondary | ICD-10-CM | POA: Diagnosis present

## 2019-12-16 DIAGNOSIS — F0391 Unspecified dementia with behavioral disturbance: Secondary | ICD-10-CM | POA: Diagnosis present

## 2019-12-16 DIAGNOSIS — Z79811 Long term (current) use of aromatase inhibitors: Secondary | ICD-10-CM

## 2019-12-16 DIAGNOSIS — R32 Unspecified urinary incontinence: Secondary | ICD-10-CM | POA: Diagnosis present

## 2019-12-16 DIAGNOSIS — Z515 Encounter for palliative care: Secondary | ICD-10-CM | POA: Diagnosis not present

## 2019-12-16 DIAGNOSIS — F330 Major depressive disorder, recurrent, mild: Secondary | ICD-10-CM | POA: Diagnosis not present

## 2019-12-16 DIAGNOSIS — R05 Cough: Secondary | ICD-10-CM | POA: Diagnosis not present

## 2019-12-16 DIAGNOSIS — Z9012 Acquired absence of left breast and nipple: Secondary | ICD-10-CM | POA: Diagnosis not present

## 2019-12-16 DIAGNOSIS — C50911 Malignant neoplasm of unspecified site of right female breast: Secondary | ICD-10-CM | POA: Diagnosis present

## 2019-12-16 DIAGNOSIS — I4891 Unspecified atrial fibrillation: Secondary | ICD-10-CM | POA: Diagnosis not present

## 2019-12-16 DIAGNOSIS — J4521 Mild intermittent asthma with (acute) exacerbation: Secondary | ICD-10-CM | POA: Diagnosis not present

## 2019-12-16 DIAGNOSIS — F339 Major depressive disorder, recurrent, unspecified: Secondary | ICD-10-CM | POA: Diagnosis not present

## 2019-12-16 DIAGNOSIS — R0602 Shortness of breath: Secondary | ICD-10-CM | POA: Diagnosis not present

## 2019-12-16 DIAGNOSIS — F0280 Dementia in other diseases classified elsewhere without behavioral disturbance: Secondary | ICD-10-CM | POA: Diagnosis not present

## 2019-12-16 DIAGNOSIS — F329 Major depressive disorder, single episode, unspecified: Secondary | ICD-10-CM | POA: Diagnosis present

## 2019-12-16 DIAGNOSIS — J45901 Unspecified asthma with (acute) exacerbation: Secondary | ICD-10-CM | POA: Diagnosis not present

## 2019-12-16 DIAGNOSIS — M6281 Muscle weakness (generalized): Secondary | ICD-10-CM | POA: Diagnosis not present

## 2019-12-16 DIAGNOSIS — Z923 Personal history of irradiation: Secondary | ICD-10-CM | POA: Diagnosis not present

## 2019-12-16 DIAGNOSIS — Z7982 Long term (current) use of aspirin: Secondary | ICD-10-CM

## 2019-12-16 DIAGNOSIS — E876 Hypokalemia: Secondary | ICD-10-CM | POA: Diagnosis present

## 2019-12-16 DIAGNOSIS — Z885 Allergy status to narcotic agent status: Secondary | ICD-10-CM

## 2019-12-16 DIAGNOSIS — E119 Type 2 diabetes mellitus without complications: Secondary | ICD-10-CM | POA: Diagnosis present

## 2019-12-16 DIAGNOSIS — Z9221 Personal history of antineoplastic chemotherapy: Secondary | ICD-10-CM

## 2019-12-16 DIAGNOSIS — Z0000 Encounter for general adult medical examination without abnormal findings: Secondary | ICD-10-CM

## 2019-12-16 DIAGNOSIS — R197 Diarrhea, unspecified: Secondary | ICD-10-CM | POA: Diagnosis present

## 2019-12-16 DIAGNOSIS — I480 Paroxysmal atrial fibrillation: Secondary | ICD-10-CM | POA: Diagnosis present

## 2019-12-16 DIAGNOSIS — E1165 Type 2 diabetes mellitus with hyperglycemia: Secondary | ICD-10-CM | POA: Diagnosis present

## 2019-12-16 DIAGNOSIS — J9601 Acute respiratory failure with hypoxia: Secondary | ICD-10-CM

## 2019-12-16 DIAGNOSIS — R1310 Dysphagia, unspecified: Secondary | ICD-10-CM | POA: Diagnosis not present

## 2019-12-16 DIAGNOSIS — I4892 Unspecified atrial flutter: Secondary | ICD-10-CM | POA: Diagnosis not present

## 2019-12-16 DIAGNOSIS — T380X5A Adverse effect of glucocorticoids and synthetic analogues, initial encounter: Secondary | ICD-10-CM | POA: Diagnosis not present

## 2019-12-16 DIAGNOSIS — Z79899 Other long term (current) drug therapy: Secondary | ICD-10-CM

## 2019-12-16 DIAGNOSIS — E785 Hyperlipidemia, unspecified: Secondary | ICD-10-CM | POA: Diagnosis present

## 2019-12-16 DIAGNOSIS — J189 Pneumonia, unspecified organism: Secondary | ICD-10-CM | POA: Diagnosis present

## 2019-12-16 DIAGNOSIS — F039 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: Secondary | ICD-10-CM | POA: Diagnosis not present

## 2019-12-16 DIAGNOSIS — Z888 Allergy status to other drugs, medicaments and biological substances status: Secondary | ICD-10-CM

## 2019-12-16 DIAGNOSIS — R69 Illness, unspecified: Secondary | ICD-10-CM | POA: Diagnosis not present

## 2019-12-16 DIAGNOSIS — Z66 Do not resuscitate: Secondary | ICD-10-CM | POA: Diagnosis not present

## 2019-12-16 DIAGNOSIS — R1312 Dysphagia, oropharyngeal phase: Secondary | ICD-10-CM | POA: Diagnosis not present

## 2019-12-16 DIAGNOSIS — Z7401 Bed confinement status: Secondary | ICD-10-CM | POA: Diagnosis not present

## 2019-12-16 DIAGNOSIS — I10 Essential (primary) hypertension: Secondary | ICD-10-CM | POA: Diagnosis not present

## 2019-12-16 DIAGNOSIS — R41841 Cognitive communication deficit: Secondary | ICD-10-CM | POA: Diagnosis not present

## 2019-12-16 DIAGNOSIS — Z7189 Other specified counseling: Secondary | ICD-10-CM | POA: Diagnosis not present

## 2019-12-16 DIAGNOSIS — Z7984 Long term (current) use of oral hypoglycemic drugs: Secondary | ICD-10-CM

## 2019-12-16 DIAGNOSIS — R2689 Other abnormalities of gait and mobility: Secondary | ICD-10-CM | POA: Diagnosis not present

## 2019-12-16 LAB — COMPREHENSIVE METABOLIC PANEL
ALT: 17 U/L (ref 0–44)
AST: 17 U/L (ref 15–41)
Albumin: 3.5 g/dL (ref 3.5–5.0)
Alkaline Phosphatase: 87 U/L (ref 38–126)
Anion gap: 11 (ref 5–15)
BUN: 18 mg/dL (ref 8–23)
CO2: 27 mmol/L (ref 22–32)
Calcium: 9 mg/dL (ref 8.9–10.3)
Chloride: 101 mmol/L (ref 98–111)
Creatinine, Ser: 0.7 mg/dL (ref 0.44–1.00)
GFR calc Af Amer: >60 mL/min (ref >60)
GFR calc non Af Amer: >60 mL/min (ref >60)
Glucose, Bld: 225 mg/dL — ABNORMAL HIGH (ref 70–99)
Potassium: 3.4 mmol/L — ABNORMAL LOW (ref 3.5–5.1)
Sodium: 139 mmol/L (ref 135–145)
Total Bilirubin: 1 mg/dL (ref 0.3–1.2)
Total Protein: 7.3 g/dL (ref 6.5–8.1)

## 2019-12-16 LAB — CBC WITH DIFFERENTIAL/PLATELET
Abs Immature Granulocytes: 0.06 10*3/uL (ref 0.00–0.07)
Basophils Absolute: 0 10*3/uL (ref 0.0–0.1)
Basophils Relative: 0 %
Eosinophils Absolute: 0 10*3/uL (ref 0.0–0.5)
Eosinophils Relative: 0 %
HCT: 39.1 % (ref 36.0–46.0)
Hemoglobin: 12.6 g/dL (ref 12.0–15.0)
Immature Granulocytes: 0 %
Lymphocytes Relative: 7 %
Lymphs Abs: 1 10*3/uL (ref 0.7–4.0)
MCH: 29.7 pg (ref 26.0–34.0)
MCHC: 32.2 g/dL (ref 30.0–36.0)
MCV: 92.2 fL (ref 80.0–100.0)
Monocytes Absolute: 1.1 10*3/uL — ABNORMAL HIGH (ref 0.1–1.0)
Monocytes Relative: 8 %
Neutro Abs: 11.3 10*3/uL — ABNORMAL HIGH (ref 1.7–7.7)
Neutrophils Relative %: 85 %
Platelets: 219 10*3/uL (ref 150–400)
RBC: 4.24 MIL/uL (ref 3.87–5.11)
RDW: 13 % (ref 11.5–15.5)
WBC: 13.4 10*3/uL — ABNORMAL HIGH (ref 4.0–10.5)
nRBC: 0 % (ref 0.0–0.2)

## 2019-12-16 LAB — TSH: TSH: 1.774 u[IU]/mL (ref 0.350–4.500)

## 2019-12-16 LAB — LACTIC ACID, PLASMA: Lactic Acid, Venous: 2.2 mmol/L (ref 0.5–1.9)

## 2019-12-16 LAB — BRAIN NATRIURETIC PEPTIDE: B Natriuretic Peptide: 196 pg/mL — ABNORMAL HIGH (ref 0.0–100.0)

## 2019-12-16 LAB — CBG MONITORING, ED: Glucose-Capillary: 200 mg/dL — ABNORMAL HIGH (ref 70–99)

## 2019-12-16 LAB — MRSA PCR SCREENING: MRSA by PCR: NEGATIVE

## 2019-12-16 LAB — MAGNESIUM: Magnesium: 1.8 mg/dL (ref 1.7–2.4)

## 2019-12-16 LAB — GLUCOSE, CAPILLARY: Glucose-Capillary: 325 mg/dL — ABNORMAL HIGH (ref 70–99)

## 2019-12-16 LAB — RESPIRATORY PANEL BY RT PCR (FLU A&B, COVID)
Influenza A by PCR: NEGATIVE
Influenza B by PCR: NEGATIVE
SARS Coronavirus 2 by RT PCR: NEGATIVE

## 2019-12-16 LAB — TROPONIN I (HIGH SENSITIVITY)
Troponin I (High Sensitivity): 15 ng/L (ref <18)
Troponin I (High Sensitivity): 29 ng/L — ABNORMAL HIGH (ref <18)

## 2019-12-16 MED ORDER — DILTIAZEM HCL-DEXTROSE 125-5 MG/125ML-% IV SOLN (PREMIX): 5.0000 mg/h | INTRAVENOUS | Status: DC

## 2019-12-16 MED ORDER — METOPROLOL TARTRATE 5 MG/5ML IV SOLN
5.0000 mg | Freq: Once | INTRAVENOUS | Status: AC
  Administered 2019-12-16: 18:00:00 5 mg via INTRAVENOUS
  Filled 2019-12-16: qty 5

## 2019-12-16 MED ORDER — ONDANSETRON HCL 4 MG/2ML IJ SOLN: 4.0000 mg | Freq: Four times a day (QID) | INTRAMUSCULAR | Status: DC | PRN

## 2019-12-16 MED ORDER — POTASSIUM CHLORIDE 20 MEQ PO PACK
40.0000 meq | PACK | Freq: Once | ORAL | Status: AC
  Administered 2019-12-16: 18:00:00 40 meq via ORAL
  Filled 2019-12-16: qty 2

## 2019-12-16 MED ORDER — AZITHROMYCIN 250 MG PO TABS
500.0000 mg | ORAL_TABLET | Freq: Every day | ORAL | Status: DC
  Administered 2019-12-17 – 2019-12-21 (×5): 500 mg via ORAL
  Filled 2019-12-16 (×5): qty 2

## 2019-12-16 MED ORDER — SODIUM CHLORIDE 0.9 % IV SOLN
1.0000 g | INTRAVENOUS | Status: DC
  Administered 2019-12-17 – 2019-12-20 (×4): 1 g via INTRAVENOUS
  Filled 2019-12-16 (×4): qty 10

## 2019-12-16 MED ORDER — AZITHROMYCIN 250 MG PO TABS
500.0000 mg | ORAL_TABLET | Freq: Once | ORAL | Status: AC
  Administered 2019-12-16: 500 mg via ORAL
  Filled 2019-12-16: qty 2

## 2019-12-16 MED ORDER — DILTIAZEM HCL-DEXTROSE 125-5 MG/125ML-% IV SOLN (PREMIX)
5.0000 mg/h | INTRAVENOUS | Status: DC
  Administered 2019-12-16: 10 mg/h via INTRAVENOUS
  Administered 2019-12-16: 5 mg/h via INTRAVENOUS
  Administered 2019-12-16 – 2019-12-18 (×4): 15 mg/h via INTRAVENOUS
  Filled 2019-12-16 (×5): qty 125

## 2019-12-16 MED ORDER — SODIUM CHLORIDE 0.9 % IV BOLUS
500.0000 mL | Freq: Once | INTRAVENOUS | Status: AC
  Administered 2019-12-16: 22:00:00 500 mL via INTRAVENOUS

## 2019-12-16 MED ORDER — INSULIN ASPART 100 UNIT/ML ~~LOC~~ SOLN
0.0000 [IU] | Freq: Every day | SUBCUTANEOUS | Status: DC
  Administered 2019-12-16: 22:00:00 4 [IU] via SUBCUTANEOUS
  Administered 2019-12-20: 2 [IU] via SUBCUTANEOUS
  Administered 2019-12-21: 23:00:00 5 [IU] via SUBCUTANEOUS

## 2019-12-16 MED ORDER — ACETAMINOPHEN 650 MG RE SUPP: 650.0000 mg | Freq: Four times a day (QID) | RECTAL | Status: DC | PRN

## 2019-12-16 MED ORDER — ALBUTEROL (5 MG/ML) CONTINUOUS INHALATION SOLN
10.0000 mg/h | INHALATION_SOLUTION | RESPIRATORY_TRACT | Status: DC
  Administered 2019-12-16: 10 mg/h via RESPIRATORY_TRACT
  Filled 2019-12-16: qty 20

## 2019-12-16 MED ORDER — DONEPEZIL HCL 5 MG PO TABS
5.0000 mg | ORAL_TABLET | Freq: Every day | ORAL | Status: DC
  Administered 2019-12-17 – 2019-12-21 (×5): 5 mg via ORAL
  Filled 2019-12-16 (×5): qty 1

## 2019-12-16 MED ORDER — DILTIAZEM HCL 25 MG/5ML IV SOLN
10.0000 mg | Freq: Once | INTRAVENOUS | Status: AC
  Administered 2019-12-16: 16:00:00 10 mg via INTRAVENOUS

## 2019-12-16 MED ORDER — METHYLPREDNISOLONE SODIUM SUCC 40 MG IJ SOLR
40.0000 mg | Freq: Two times a day (BID) | INTRAMUSCULAR | Status: DC
  Administered 2019-12-17 – 2019-12-19 (×5): 40 mg via INTRAVENOUS
  Filled 2019-12-16 (×5): qty 1

## 2019-12-16 MED ORDER — LEVALBUTEROL HCL 0.63 MG/3ML IN NEBU
0.6300 mg | INHALATION_SOLUTION | Freq: Four times a day (QID) | RESPIRATORY_TRACT | Status: DC | PRN
  Administered 2019-12-18 – 2019-12-20 (×3): 0.63 mg via RESPIRATORY_TRACT
  Filled 2019-12-16 (×3): qty 3

## 2019-12-16 MED ORDER — ALBUTEROL SULFATE HFA 108 (90 BASE) MCG/ACT IN AERS
2.0000 | INHALATION_SPRAY | Freq: Once | RESPIRATORY_TRACT | Status: AC
  Administered 2019-12-16: 2 via RESPIRATORY_TRACT
  Filled 2019-12-16: qty 6.7

## 2019-12-16 MED ORDER — ACETAMINOPHEN 325 MG PO TABS
650.0000 mg | ORAL_TABLET | Freq: Four times a day (QID) | ORAL | Status: DC | PRN
  Administered 2019-12-19: 650 mg via ORAL
  Filled 2019-12-16: qty 2

## 2019-12-16 MED ORDER — ONDANSETRON HCL 4 MG PO TABS: 4.0000 mg | ORAL_TABLET | Freq: Four times a day (QID) | ORAL | Status: DC | PRN

## 2019-12-16 MED ORDER — RISPERIDONE 0.5 MG PO TABS
0.5000 mg | ORAL_TABLET | Freq: Two times a day (BID) | ORAL | Status: DC
  Administered 2019-12-16 – 2019-12-22 (×12): 0.5 mg via ORAL
  Filled 2019-12-16 (×12): qty 1

## 2019-12-16 MED ORDER — SIMVASTATIN 20 MG PO TABS
40.0000 mg | ORAL_TABLET | Freq: Every evening | ORAL | Status: DC
  Administered 2019-12-17: 40 mg via ORAL
  Filled 2019-12-16: qty 2

## 2019-12-16 MED ORDER — SODIUM CHLORIDE 0.9 % IV SOLN
1.0000 g | Freq: Once | INTRAVENOUS | Status: AC
  Administered 2019-12-16: 1 g via INTRAVENOUS
  Filled 2019-12-16: qty 10

## 2019-12-16 MED ORDER — ENOXAPARIN SODIUM 40 MG/0.4ML ~~LOC~~ SOLN
40.0000 mg | SUBCUTANEOUS | Status: DC
  Administered 2019-12-16 – 2019-12-17 (×2): 40 mg via SUBCUTANEOUS
  Filled 2019-12-16 (×2): qty 0.4

## 2019-12-16 MED ORDER — SODIUM CHLORIDE 0.9 % IV SOLN: INTRAVENOUS | Status: DC

## 2019-12-16 MED ORDER — AEROCHAMBER Z-STAT PLUS/MEDIUM MISC
1.0000 | Freq: Once | Status: AC
  Administered 2019-12-16: 13:00:00 1

## 2019-12-16 MED ORDER — ANASTROZOLE 1 MG PO TABS
1.0000 mg | ORAL_TABLET | Freq: Every day | ORAL | Status: DC
  Administered 2019-12-17 – 2019-12-22 (×6): 1 mg via ORAL
  Filled 2019-12-16 (×9): qty 1

## 2019-12-16 MED ORDER — METHYLPREDNISOLONE SODIUM SUCC 125 MG IJ SOLR
125.0000 mg | Freq: Once | INTRAMUSCULAR | Status: AC
  Administered 2019-12-16: 13:00:00 125 mg via INTRAVENOUS
  Filled 2019-12-16: qty 2

## 2019-12-16 MED ORDER — DILTIAZEM LOAD VIA INFUSION
15.0000 mg | Freq: Once | INTRAVENOUS | Status: AC
  Administered 2019-12-16: 17:00:00 15 mg via INTRAVENOUS
  Filled 2019-12-16: qty 15

## 2019-12-16 MED ORDER — INSULIN ASPART 100 UNIT/ML ~~LOC~~ SOLN
0.0000 [IU] | Freq: Three times a day (TID) | SUBCUTANEOUS | Status: DC
  Administered 2019-12-17: 8 [IU] via SUBCUTANEOUS
  Administered 2019-12-17: 15 [IU] via SUBCUTANEOUS
  Administered 2019-12-17 – 2019-12-18 (×4): 8 [IU] via SUBCUTANEOUS
  Administered 2019-12-19: 5 [IU] via SUBCUTANEOUS
  Administered 2019-12-19 – 2019-12-20 (×4): 8 [IU] via SUBCUTANEOUS
  Administered 2019-12-21: 17:00:00 5 [IU] via SUBCUTANEOUS
  Administered 2019-12-21: 10:00:00 2 [IU] via SUBCUTANEOUS
  Administered 2019-12-21: 12:00:00 5 [IU] via SUBCUTANEOUS
  Administered 2019-12-22: 8 [IU] via SUBCUTANEOUS
  Administered 2019-12-22: 10:00:00 5 [IU] via SUBCUTANEOUS
  Administered 2019-12-22: 18:00:00 3 [IU] via SUBCUTANEOUS

## 2019-12-16 MED ORDER — METOPROLOL TARTRATE 5 MG/5ML IV SOLN
5.0000 mg | INTRAVENOUS | Status: AC | PRN
  Administered 2019-12-16 – 2019-12-17 (×2): 5 mg via INTRAVENOUS
  Filled 2019-12-16 (×2): qty 5

## 2019-12-16 MED ORDER — SODIUM CHLORIDE 0.9 % IV BOLUS
500.0000 mL | Freq: Once | INTRAVENOUS | Status: AC
  Administered 2019-12-16: 17:00:00 500 mL via INTRAVENOUS

## 2019-12-16 MED ORDER — DILTIAZEM HCL 25 MG/5ML IV SOLN
INTRAVENOUS | Status: AC
  Filled 2019-12-16: qty 5

## 2019-12-16 NOTE — ED Notes (Signed)
Pt in bed, family at bedside, pt states that she is feeling better, pt talking in full sentences.

## 2019-12-16 NOTE — ED Notes (Signed)
Pt in bed, family at bedside, bed in low position, ambulated pt, pt is generally weak, upon getting back into bed with assistance pt desatted to 85, MD notified, once back in bed, pt O2 sat returned to mid 90s.  Pt has bilateral wheeze, pt oriented to place and self, reoriented pt, pt talking in short sentences.

## 2019-12-16 NOTE — H&P (Signed)
History and Physical    Merie T Brusca [id number] DOB: 1940/12/20 DOA: 12/16/2019  PCP: [name], NP   Patient coming from: Home  I have personally briefly reviewed patient's old medical records in [place]  Chief Complaint: SOB  HPI: Amy Hoffman is a 79 y.o. female with medical history significant for diabetes mellitus, hypertension, depression, dementia, breast cancer. Patient presented to the ED with complaints of difficulty breathing and wheezing over the past 2 days.  Daughter present at bedside helps with most of the history.  She reports patient has some mild diarrhea previously. No fevers, no chills, no vomiting.  No lower extremity swelling.  No chest pain.  Denies dysuria.  No dizziness.  She is unaware of a fast heart rate.  Patient was in the ED yesterday with complaints of hyperglycemia but blood sugars were 215, also with increased sleepiness, and diarrhea.  But patient left AMA, with her caregiver, per notes because "it was taking too long".  ED Course: O2 sats with ambulation down to 85%, with increasing respiratory distress.  Heart rate initially mostly 80s to 109.  But on my evaluation patient's heart rate jumped up to the 180s.  Patient had just completed an hour-long nebulizer treatment.  WBC 13.4.  Chest x-ray suggests multifocal pneumonia.  IV ceftriaxone given.  Hospitalist to admit for asthma and pneumonia.  Review of Systems: As per HPI all other systems reviewed and negative.  Past Medical History:  Diagnosis Date  . Abnormal uterine and vaginal bleeding, unspecified   . Allergic rhinitis due to pollen   . Anxiety   . Asthma   . Cancer [facility])    breast cancer - right  . Dementia ([place])   . Depression   . Depression   . Diabetes mellitus without complication ([place])   . Essential (primary) hypertension   . Hyperlipidemia, unspecified   . Hypertension   . Major depressive disorder, single episode, unspecified   . Obesity, unspecified   .  Pain in joint involving shoulder region   . Reflux esophagitis   . Trigger finger, acquired   . Type 2 diabetes mellitus with hyperglycemia ([place])   . Unspecified asthma with (acute) exacerbation   . Unspecified asthma, uncomplicated   . 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 ([place])   . Unspecified osteoarthritis, unspecified site     Past Surgical History:  Procedure Laterality Date  . CHOLECYSTECTOMY    . MASTECTOMY Right   . RE-EXCISION OF BREAST LUMPECTOMY       reports that she has never smoked. She has never used smokeless tobacco. She reports that she does not drink alcohol or use drugs.  Allergies  Allergen Reactions  . Codeine   . Zantac [Ranitidine Hcl]     Family History  Family history unknown: Yes    Prior to Admission medications   Medication Sig Start Date End Date Taking? Authorizing Provider  amLODipine (NORVASC) 5 MG tablet Take 5 mg by mouth daily. 12/26/18  Yes [provider]  anastrozole (ARIMIDEX) 1 MG tablet Take 1 mg by mouth daily.   Yes [provider]  donepezil (ARICEPT) 5 MG tablet Take 5 mg by mouth at bedtime.   Yes [provider]  glimepiride (AMARYL) 4 MG tablet Take 8 mg by mouth every morning.    Yes [provider]  risperiDONE (RISPERDAL) 0.5 MG tablet Take 0.5 mg by mouth 2 (two) times daily.   Yes [provider]  simvastatin (ZOCOR)  40 MG tablet Take 40 mg by mouth every evening.   Yes [provider]    Physical Exam: Vitals:   12/16/19 1612 12/16/19 1624 12/16/19 1624 12/16/19 1700  BP:  (!) 111/51 (!) 111/51 108/60  Pulse: (!) 161 86    Resp: (!) 21 (!) 42 20 19  Temp:   99 F (37.2 C)   TempSrc:   Oral   SpO2: 94% 91% 93%   Weight:      Height:        Constitutional: NAD, calm, comfortable Vitals:   12/16/19 1612 12/16/19 1624 12/16/19 1624 12/16/19 1700  BP:  (!) 111/51 (!) 111/51 108/60  Pulse: (!) 161 86    Resp: (!) 21 (!) 42 20 19  Temp:   99 F  (37.2 C)   TempSrc:   Oral   SpO2: 94% 91% 93%   Weight:      Height:       Eyes: PERRL, lids and conjunctivae normal ENMT: Mucous membranes are moist.  Neck: normal, supple, no masses, no thyromegaly Respiratory: Diffuse expiratory wheezing, Normal respiratory effort. No accessory muscle use.  Cardiovascular: Irregular, tachycardic .  Trace extremity edema. 2+ pedal pulses.   Abdomen: no tenderness, no masses palpated. No hepatosplenomegaly. Bowel sounds positive.  Musculoskeletal: no clubbing / cyanosis. No joint deformity upper and lower extremities. Good ROM, no contractures. Normal muscle tone.  Skin: no rashes, lesions, ulcers. No induration Neurologic: No apparent cranial normality, moving extremities spontaneously.Marland Kitchen  Psychiatric: Answers simple questions appropriately alert and oriented to person and place.  Labs on Admission: I have personally reviewed following labs and imaging studies  CBC: Recent Labs  Lab 12/16/19 1238  WBC 13.4*  NEUTROABS 11.3*  HGB 12.6  HCT 39.1  MCV 92.2  PLT [id number]   Basic Metabolic Panel: Recent Labs  Lab 12/16/19 1238  NA 139  K 3.4*  CL 101  CO2 27  GLUCOSE 225*  BUN 18  CREATININE 0.70  CALCIUM 9.0   Liver Function Tests: Recent Labs  Lab 12/16/19 1238  AST 17  ALT 17  ALKPHOS 87  BILITOT 1.0  PROT 7.3  ALBUMIN 3.5   CBG: Recent Labs  Lab 12/15/19 1451 12/16/19 1223  GLUCAP 215* 200*    Radiological Exams on Admission: DG Chest Port 1 View  Result Date: 12/16/2019 CLINICAL DATA:  Short of breath, cough and wheezing. EXAM: PORTABLE CHEST 1 VIEW COMPARISON:  02/04/2019 FINDINGS: Cardiac silhouette is normal in size. No mediastinal or hilar masses. Subtle hazy airspace opacities noted at the right lung base and lateral to the right hilum. Lungs otherwise clear. No convincing pleural effusion.  No pneumothorax. Skeletal structures are demineralized but grossly intact. IMPRESSION: Small subtle areas of hazy airspace  opacity in the right mid and lower lung consistent with multifocal pneumonia in the proper clinical setting. Electronically Signed   By: Lajean Manes M.D.   On: 12/16/2019 13:21    EKG: Independently reviewed.  Initial EKG shows sinus tachycardia rate 109 with premature atrial contractions, subsequent EKG shows atrial fibrillation rate 171.  Assessment/Plan Principal Problem:   Asthma exacerbation Active Problems:   Type 2 diabetes mellitus with hyperglycemia (HCC)   Major depressive disorder, single episode, unspecified   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 (HCC)   Atrial fibrillation with rapid ventricular response ([place])   Community acquired pneumonia   Asthma exacerbation-O2 sats down to 85% with ambulation, wheezing, dyspnea.  Likely provoked by pneumonia. -DuoNebs as needed and scheduled -  125mg  V Solumedrol given continue 40 mg every 12 hourly  Community acquired pneumonia- T-max 99, chest x-ray shows suggests multifocal pneumonia.  WBC 13.4.  Now tachycardic, meeting sepsis criteria.  Respiratory virus panel negative for COVID-19 and influenza. -IV ceftriaxone and azithromycin -Obtain lactic acid -Mucolytics as needed, supplemental O2  Atrial fibrillation with RVR-heart rate up to 180s, likely provoked by hour-long bronchodilator treatments in the setting of asthma exacerbation and pneumonia.  Troponin 15.  potassium 3.4. CHAds2vasc score at least 5.  -Check TSH, mag -Replete K - trend troponin  -Obtain echocardiogram -Will hold off on anticoagulation at this time, considering the setting at which this is occurring.  -10 mg Cardizem given without significant response, repeat with 15mg  bolus and start on Cardizem drip - Rate still uncontrolled, will add IV metoprolol 5mg .  Dementia depression-  -Resume home risperidone, donepezil.  Hypertension-blood pressure initially in the 150s, now soft likely due to RVR. -Hold Norvasc to allow for titration of rate  limiting medications  Diabetes mellitus- random glucose 225.  -On steroids, monitor sugars closely - SSi- Mod,  -Resume home glimepiride - HgbA1c  Breast cancer history-history of recurrent breast cancer, status post lumpectomy and radiation and chemotherapy -resume home anastrozole  Diarrhea- stool C. difficile   DVT prophylaxis: Lovenox Code Status: Full code, confirmed with patient and daughter at bedside. Family Communication: Daughter at bedside. Disposition Plan: > 2 days Consults called: None Admission status: Inpatient, stepdown I certify that at the point of admission it is my clinical judgment that the patient will require inpatient hospital care spanning beyond 2 midnights from the point of admission due to high intensity of service, high risk for further deterioration and high frequency of surveillance required.    Bethena Roys MD [facility]  12/16/2019, 5:52 PM

## 2019-12-16 NOTE — ED Notes (Signed)
Pt in bed, pt has continuous neb in place, pt offers no complaints.

## 2019-12-16 NOTE — ED Provider Notes (Signed)
[facility] EMERGENCY DEPARTMENT Provider Note   CSN: [id number] Arrival date & time: 12/16/19  1208     History Chief Complaint  Patient presents with  . Shortness of Breath    Amy Hoffman is a 79 y.o. female.  HPI   This patient is a 79 year old female, she is accompanied to the hospital by her daughter, her daughter is the primary historian as the patient has some confusion, the patient has a history of asthma which only requires intermittent treatments, she also has a history of dementia thus a level 5 caveat applies  According to the daughter the patient has had some increasing shortness of breath and wheezing over the last couple of days and is also had some recent diarrhea.  She lives with her husband who smokes although the patient does not smoke.  She has no history of emphysema.  There has been no reported fevers, rashes, headaches, seizures.  She does have some diarrhea but no nausea or vomiting.  Appetite has been decreased.  She has not recently had any albuterol treatments, no history of congestive heart failure or cardiac disease  Past Medical History:  Diagnosis Date  . Abnormal uterine and vaginal bleeding, unspecified   . Allergic rhinitis due to pollen   . Anxiety   . Asthma   . Cancer [facility])    breast cancer - right  . Dementia ([place])   . Depression   . Depression   . Diabetes mellitus without complication ([place])   . Essential (primary) hypertension   . Hyperlipidemia, unspecified   . Hypertension   . Major depressive disorder, single episode, unspecified   . Obesity, unspecified   . Pain in joint involving shoulder region   . Reflux esophagitis   . Trigger finger, acquired   . Type 2 diabetes mellitus with hyperglycemia ([place])   . Unspecified asthma with (acute) exacerbation   . Unspecified asthma, uncomplicated   . 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 ([place])   . Unspecified osteoarthritis, unspecified site     Patient Active  Problem List   Diagnosis Date Noted  . Hyperlipidemia, unspecified   . Obesity, unspecified   . Type 2 diabetes mellitus with hyperglycemia ([place])   . Abnormal uterine and vaginal bleeding, unspecified   . Allergic rhinitis due to pollen   . Essential (primary) hypertension   . Major depressive disorder, single episode, unspecified   . Unspecified asthma with (acute) exacerbation   . Unspecified asthma, uncomplicated   . 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 ([place])   . Unspecified osteoarthritis, unspecified site   . Pain in joint involving shoulder region   . Dysphagia 07/10/2019  . Loss of weight 07/10/2019  . Numbness and tingling in hands 04/17/2014  . SOB (shortness of breath) 01/30/2014  . Breast CA ([place]) 01/30/2014  . Elevated blood pressure 01/30/2014    Past Surgical History:  Procedure Laterality Date  . CHOLECYSTECTOMY    . MASTECTOMY Right   . RE-EXCISION OF BREAST LUMPECTOMY       OB History   No obstetric history on file.     Family History  Family history unknown: Yes    Social History   Tobacco Use  . Smoking status: Never Smoker  . Smokeless tobacco: Never Used  Substance Use Topics  . Alcohol use: No  . Drug use: No    Home Medications Prior to Admission medications   Medication Sig Start Date End Date Taking? Authorizing Provider  amLODipine (NORVASC) 5 MG  tablet Take 5 mg by mouth daily. 12/26/18  Yes [provider]  anastrozole (ARIMIDEX) 1 MG tablet Take 1 mg by mouth daily.   Yes [provider]  donepezil (ARICEPT) 5 MG tablet Take 5 mg by mouth at bedtime.   Yes [provider]  glimepiride (AMARYL) 4 MG tablet Take 8 mg by mouth every morning.    Yes [provider]  risperiDONE (RISPERDAL) 0.5 MG tablet Take 0.5 mg by mouth 2 (two) times daily.   Yes [provider]  simvastatin (ZOCOR) 40 MG tablet Take 40 mg by mouth every evening.   Yes [provider]     Allergies    Codeine and Zantac [ranitidine hcl]  Review of Systems   Review of Systems  Unable to perform ROS: Dementia    Physical Exam Updated Vital Signs BP (!) 186/67 (BP Location: Right Arm)   Pulse (!) 104   Temp 98.6 F (37 C) (Oral)   Resp 14   Ht 1.651 m (5\' 5" )   Wt 58.9 kg   SpO2 100%   BMI 21.61 kg/m   Physical Exam Vitals and nursing note reviewed.  Constitutional:      General: She is not in acute distress.    Appearance: She is well-developed.  HENT:     Head: Normocephalic and atraumatic.     Mouth/Throat:     Pharynx: No oropharyngeal exudate.  Eyes:     General: No scleral icterus.       Right eye: No discharge.        Left eye: No discharge.     Conjunctiva/sclera: Conjunctivae normal.     Pupils: Pupils are equal, round, and reactive to light.  Neck:     Thyroid: No thyromegaly.     Vascular: No JVD.     Comments: No JVD Cardiovascular:     Rate and Rhythm: Regular rhythm. Tachycardia present.     Heart sounds: Normal heart sounds. No murmur. No friction rub. No gallop.      Comments: Heart rate of 105, no murmurs, normal pulses at the radial arteries Pulmonary:     Effort: Pulmonary effort is normal. No respiratory distress.     Breath sounds: Wheezing present. No rales.     Comments: The patient speaks in full sentences, she has diffuse expiratory wheezing with mild tachypnea but no accessory muscle use Abdominal:     General: Bowel sounds are normal. There is no distension.     Palpations: Abdomen is soft. There is no mass.     Tenderness: There is no abdominal tenderness.  Musculoskeletal:        General: No tenderness. Normal range of motion.     Cervical back: Normal range of motion and neck supple.     Right lower leg: Edema present.     Left lower leg: Edema present.     Comments: Symmetrical 1-2+ pitting edema bilaterally below the knees  Lymphadenopathy:     Cervical: No cervical adenopathy.  Skin:    General: Skin is  warm and dry.     Findings: No erythema or rash.  Neurological:     General: No focal deficit present.     Mental Status: She is alert.     Coordination: Coordination normal.  Psychiatric:        Behavior: Behavior normal.     ED Results / Procedures / Treatments   Labs (all labs ordered are listed, but only abnormal results are  displayed) Labs Reviewed  CBC WITH DIFFERENTIAL/PLATELET - Abnormal; Notable for the following components:      Result Value   WBC 13.4 (*)    Neutro Abs 11.3 (*)    Monocytes Absolute 1.1 (*)    All other components within normal limits  COMPREHENSIVE METABOLIC PANEL - Abnormal; Notable for the following components:   Potassium 3.4 (*)    Glucose, Bld 225 (*)    All other components within normal limits  BRAIN NATRIURETIC PEPTIDE - Abnormal; Notable for the following components:   B Natriuretic Peptide 196.0 (*)    All other components within normal limits  CBG MONITORING, ED - Abnormal; Notable for the following components:   Glucose-Capillary 200 (*)    All other components within normal limits  RESPIRATORY PANEL BY RT PCR (FLU A&B, COVID)  URINALYSIS, ROUTINE W REFLEX MICROSCOPIC  TROPONIN I (HIGH SENSITIVITY)    EKG EKG Interpretation  Date/Time:  Saturday December 16 2019 12:27:42 EDT Ventricular Rate:  109 PR Interval:    QRS Duration: 91 QT Interval:  334 QTC Calculation: 450 R Axis:   14 Text Interpretation: Sinus tachycardia Atrial premature complex Baseline wander in lead(s) V1 Confirmed by Noemi Chapel [phone]) on 12/16/2019 12:33:56 PM   Radiology DG Chest Port 1 View  Result Date: 12/16/2019 CLINICAL DATA:  Short of breath, cough and wheezing. EXAM: PORTABLE CHEST 1 VIEW COMPARISON:  02/04/2019 FINDINGS: Cardiac silhouette is normal in size. No mediastinal or hilar masses. Subtle hazy airspace opacities noted at the right lung base and lateral to the right hilum. Lungs otherwise clear. No convincing pleural effusion.  No  pneumothorax. Skeletal structures are demineralized but grossly intact. IMPRESSION: Small subtle areas of hazy airspace opacity in the right mid and lower lung consistent with multifocal pneumonia in the proper clinical setting. Electronically Signed   By: Lajean Manes M.D.   On: 12/16/2019 13:21    Procedures .Critical Care Performed by: Noemi Chapel, MD Authorized by: Noemi Chapel, MD   Critical care provider statement:    Critical care time (minutes):  35   Critical care time was exclusive of:  Separately billable procedures and treating other patients and teaching time   Critical care was necessary to treat or prevent imminent or life-threatening deterioration of the following conditions:  Respiratory failure   Critical care was time spent personally by me on the following activities:  Blood draw for specimens, development of treatment plan with patient or surrogate, discussions with consultants, evaluation of patient's response to treatment, examination of patient, obtaining history from patient or surrogate, ordering and performing treatments and interventions, ordering and review of laboratory studies, ordering and review of radiographic studies, pulse oximetry, re-evaluation of patient's condition and review of old charts   (including critical care time)  Medications Ordered in ED Medications  albuterol (PROVENTIL,VENTOLIN) solution continuous neb (10 mg/hr Nebulization New Bag/Given 12/16/19 1450)  cefTRIAXone (ROCEPHIN) 1 g in sodium chloride 0.9 % 100 mL IVPB (has no administration in time range)  azithromycin (ZITHROMAX) tablet 500 mg (has no administration in time range)  albuterol (VENTOLIN HFA) 108 (90 Base) MCG/ACT inhaler 2 puff (2 puffs Inhalation Given 12/16/19 1316)  aerochamber Z-Stat Plus/medium 1 each (1 each Other Given 12/16/19 1317)  methylPREDNISolone sodium succinate (SOLU-MEDROL) 125 mg/2 mL injection 125 mg (125 mg Intravenous Given 12/16/19 1314)    ED Course  I  have reviewed the triage vital signs and the nursing notes.  Pertinent labs & imaging results that were available  during my care of the patient were reviewed by me and considered in my medical decision making (see chart for details).    MDM Rules/Calculators/A&P                      The patient has some seasonal asthma, she had this occur last March according to the daughter.  At this time the patient will need to be evaluated for the cause of her shortness of breath.   This patient complains of shortness of breath and wheezing, this involves an extensive number of treatment options, and is a complaint that carries with it a high risk of complications and morbidity.  The differential diagnosis includes asthma exacerbation, COPD, pneumothorax, pneumonia, sepsis, Sirs, Covid pulmonary embolism though less likely    I Ordered, reviewed, and interpreted labs, which included x-ray, BNP, BMP, CBC, troponin, EKG, cardiac monitor  I ordered medication Rocephin and Zithromax with albuterol for community-acquired pneumonia with a negative Covid test, x-ray positive for infiltrate  I ordered imaging studies which included chest x-ray portable and  I independently visualized and interpreted imaging which showed infiltrates consistent with pneumonia  Additional history obtained from family members in the medical record  Previous records obtained and reviewed   I consulted hospitalist, Dr. Denton Brick and discussed lab and imaging findings  Covid negative  Critical interventions: Albuterol bronchodilators, Rocephin, Zithromax for pneumonia  After the interventions stated above, I reevaluated the patient and found slightly improved, requiring low-dose oxygen  Mercy T Tom was evaluated in Emergency Department on 12/16/2019 for the symptoms described in the history of present illness. She was evaluated in the context of the global COVID-19 pandemic, which necessitated consideration that the patient  might be at risk for infection with the SARS-CoV-2 virus that causes COVID-19. Institutional protocols and algorithms that pertain to the evaluation of patients at risk for COVID-19 are in a state of rapid change based on information released by regulatory bodies including the CDC and federal and state organizations. These policies and algorithms were followed during the patient's care in the ED.   Final Clinical Impression(s) / ED Diagnoses Final diagnoses:  Acute respiratory failure with hypoxia [facility])  Community acquired pneumonia, unspecified laterality      Noemi Chapel, MD 12/16/19 [phone]

## 2019-12-16 NOTE — ED Triage Notes (Signed)
Patient brought in by daughter for shortness of breath and wheezing.

## 2019-12-17 ENCOUNTER — Inpatient Hospital Stay (HOSPITAL_COMMUNITY): Payer: Medicare Other

## 2019-12-17 DIAGNOSIS — F329 Major depressive disorder, single episode, unspecified: Secondary | ICD-10-CM

## 2019-12-17 DIAGNOSIS — E1165 Type 2 diabetes mellitus with hyperglycemia: Secondary | ICD-10-CM

## 2019-12-17 DIAGNOSIS — I4891 Unspecified atrial fibrillation: Secondary | ICD-10-CM

## 2019-12-17 LAB — URINALYSIS, ROUTINE W REFLEX MICROSCOPIC
Bacteria, UA: NONE SEEN
Bilirubin Urine: NEGATIVE
Glucose, UA: >=500 mg/dL — AB
Hgb urine dipstick: NEGATIVE
Ketones, ur: 5 mg/dL — AB
Leukocytes,Ua: NEGATIVE
Nitrite: NEGATIVE
Protein, ur: 100 mg/dL — AB
Specific Gravity, Urine: 1.021 (ref 1.005–1.030)
pH: 5 (ref 5.0–8.0)

## 2019-12-17 LAB — BASIC METABOLIC PANEL
Anion gap: 9 (ref 5–15)
BUN: 28 mg/dL — ABNORMAL HIGH (ref 8–23)
CO2: 23 mmol/L (ref 22–32)
Calcium: 8.2 mg/dL — ABNORMAL LOW (ref 8.9–10.3)
Chloride: 107 mmol/L (ref 98–111)
Creatinine, Ser: 0.86 mg/dL (ref 0.44–1.00)
GFR calc Af Amer: >60 mL/min (ref >60)
GFR calc non Af Amer: >60 mL/min (ref >60)
Glucose, Bld: 313 mg/dL — ABNORMAL HIGH (ref 70–99)
Potassium: 3.9 mmol/L (ref 3.5–5.1)
Sodium: 139 mmol/L (ref 135–145)

## 2019-12-17 LAB — CBC
HCT: 34.4 % — ABNORMAL LOW (ref 36.0–46.0)
Hemoglobin: 10.8 g/dL — ABNORMAL LOW (ref 12.0–15.0)
MCH: 28.9 pg (ref 26.0–34.0)
MCHC: 31.4 g/dL (ref 30.0–36.0)
MCV: 92 fL (ref 80.0–100.0)
Platelets: 204 10*3/uL (ref 150–400)
RBC: 3.74 MIL/uL — ABNORMAL LOW (ref 3.87–5.11)
RDW: 12.9 % (ref 11.5–15.5)
WBC: 10.1 10*3/uL (ref 4.0–10.5)
nRBC: 0 % (ref 0.0–0.2)

## 2019-12-17 LAB — GLUCOSE, CAPILLARY
Glucose-Capillary: 270 mg/dL — ABNORMAL HIGH (ref 70–99)
Glucose-Capillary: 364 mg/dL — ABNORMAL HIGH (ref 70–99)
Glucose-Capillary: 281 mg/dL — ABNORMAL HIGH (ref 70–99)
Glucose-Capillary: 162 mg/dL — ABNORMAL HIGH (ref 70–99)

## 2019-12-17 LAB — ECHOCARDIOGRAM COMPLETE
Height: 64 in
Weight: 2335.11 oz

## 2019-12-17 LAB — HEMOGLOBIN A1C
Hgb A1c MFr Bld: 6.8 % — ABNORMAL HIGH (ref 4.8–5.6)
Mean Plasma Glucose: 148.46 mg/dL

## 2019-12-17 LAB — LACTIC ACID, PLASMA: Lactic Acid, Venous: 1.4 mmol/L (ref 0.5–1.9)

## 2019-12-17 MED ORDER — ARFORMOTEROL TARTRATE 15 MCG/2ML IN NEBU
15.0000 ug | INHALATION_SOLUTION | Freq: Two times a day (BID) | RESPIRATORY_TRACT | Status: DC
  Administered 2019-12-17 (×2): 15 ug via RESPIRATORY_TRACT
  Filled 2019-12-17 (×2): qty 2

## 2019-12-17 MED ORDER — DIGOXIN 125 MCG PO TABS
0.5000 mg | ORAL_TABLET | Freq: Once | ORAL | Status: AC
  Administered 2019-12-17: 0.5 mg via ORAL
  Filled 2019-12-17: qty 4

## 2019-12-17 MED ORDER — CHLORHEXIDINE GLUCONATE CLOTH 2 % EX PADS
6.0000 | MEDICATED_PAD | Freq: Every day | CUTANEOUS | Status: DC
  Administered 2019-12-17 – 2019-12-19 (×3): 6 via TOPICAL

## 2019-12-17 MED ORDER — BUDESONIDE 0.5 MG/2ML IN SUSP
0.5000 mg | Freq: Two times a day (BID) | RESPIRATORY_TRACT | Status: DC
  Administered 2019-12-17 – 2019-12-22 (×11): 0.5 mg via RESPIRATORY_TRACT
  Filled 2019-12-17 (×11): qty 2

## 2019-12-17 MED ORDER — METOPROLOL TARTRATE 5 MG/5ML IV SOLN
5.0000 mg | INTRAVENOUS | Status: AC | PRN
  Administered 2019-12-17 (×2): 5 mg via INTRAVENOUS
  Filled 2019-12-17 (×2): qty 5

## 2019-12-17 MED ORDER — DIGOXIN 125 MCG PO TABS: 0.2500 mg | ORAL_TABLET | Freq: Every day | ORAL | Status: DC

## 2019-12-17 MED ORDER — HALOPERIDOL LACTATE 5 MG/ML IJ SOLN
0.5000 mg | Freq: Three times a day (TID) | INTRAMUSCULAR | Status: DC | PRN
  Administered 2019-12-17 – 2019-12-18 (×4): 0.5 mg via INTRAVENOUS
  Filled 2019-12-17 (×4): qty 1

## 2019-12-17 NOTE — Progress Notes (Signed)
Patient still requiring safety mitts due to being confused and wanting to leave the hospital. Patient has pulled out one IV during this shift. Patient has pulled off telemetry monitoring cords as well as oxygen probe. Dr. Dyann Kief notified and has placed order for PRN haldol. Mitts are being removed when patient eats and drinks. Patient is coughing after swallowing water, gets a little choked on it. Speech evaluation order placed. Patient does well otherwise with medications and food other than at dinner time, stated she felt like she needed to vomit after eating. Patient has not vomit. Patient has had one bowel movement since admission. Sent down to lab for CDiff testing. Lab rejected due to stool being too formed. Patient also has not voided. When bladder scan was performed, patient had greater than 600 in her bladder. In and out cath was completed, per protocol and patient had an output of about 815mL of urine. Patient did not have any complaints.

## 2019-12-17 NOTE — Progress Notes (Signed)
PROGRESS NOTE    Amy Hoffman  [id number] DOB: [birth year] DOA: 12/16/2019 PCP: [name], NP     Brief Narrative:  As per H&P written by Dr. Denton Brick on 12/16/2019 79 y.o. female with medical history significant for diabetes mellitus, hypertension, depression, dementia, breast cancer. Patient presented to the ED with complaints of difficulty breathing and wheezing over the past 2 days.  Daughter present at bedside helps with most of the history.  She reports patient has some mild diarrhea previously. No fevers, no chills, no vomiting.  No lower extremity swelling.  No chest pain.  Denies dysuria.  No dizziness.  She is unaware of a fast heart rate.  Patient was in the ED yesterday with complaints of hyperglycemia but blood sugars were 215, also with increased sleepiness, and diarrhea.  But patient left AMA, with her caregiver, per notes because "it was taking too long".  ED Course: O2 sats with ambulation down to 85%, with increasing respiratory distress.  Heart rate initially mostly 80s to 109.  But on my evaluation patient's heart rate jumped up to the 180s.  Patient had just completed an hour-long nebulizer treatment.  WBC 13.4.  Chest x-ray suggests multifocal pneumonia.  IV ceftriaxone given.  Hospitalist to admit for asthma and pneumonia.   Assessment & Plan: 1-reactive airway disease/asthma exacerbation: In the setting of community-acquired multifocal pneumonia. -Continue current antibiotics -Continue culture results -Continue supportive care -Continue nebulizer management and steroids -follow clinical response.  2-A. fib with RVR -Requiring Cardizem drip -No prior history of it -cardiology service consulted. -Follow echo results -Continue as needed beta-blocker.  3-type 2 diabetes mellitus -Hyperglycemia in the setting of steroids usage -Follow A1c -Continue sliding scale insulin.  4-dementia with behavioral disturbances -Continue Aricept -Continue  Risperdal -Continue supportive care and constant reorientation  -As needed Haldol has been ordered.  5-hyperlipidemia -Continue Zocor.  6-hx of breast cancer -continue Arimidex.  DVT prophylaxis: Lovenox Code Status: Full code Family Communication: No family at bedside. Disposition Plan: Continue nebulizer management, continue steroids, continue antibiotics and Cardizem drip; follow echo results and recommendations by cardiology.  Remains in a stepdown.  Discharge plans to be determined once heart rate is controlled and breathing is stabilized.  Consultants:   Cardiology  Procedures:   See below for x-ray report  2D echo: Pending  Antimicrobials:  Anti-infectives (From admission, onward)   Start     Dose/Rate Route Frequency Ordered Stop   12/17/19 1500  azithromycin (ZITHROMAX) tablet 500 mg     500 mg Oral Daily 12/16/19 1819     12/17/19 1500  cefTRIAXone (ROCEPHIN) 1 g in sodium chloride 0.9 % 100 mL IVPB     1 g 200 mL/hr over 30 Minutes Intravenous Every 24 hours 12/16/19 1819     12/16/19 1530  cefTRIAXone (ROCEPHIN) 1 g in sodium chloride 0.9 % 100 mL IVPB     1 g 200 mL/hr over 30 Minutes Intravenous  Once 12/16/19 1520 12/16/19 1618   12/16/19 1530  azithromycin (ZITHROMAX) tablet 500 mg     500 mg Oral  Once 12/16/19 1520 12/16/19 1538       Subjective: Continue to experience shortness of breath and difficulty speaking in full sentences; also heart rate uncontrolled despite Cardizem drip.  No chest pain, no nausea, no vomiting, no fever.  Objective: Vitals:   12/17/19 1000 12/17/19 1015 12/17/19 1030 12/17/19 1045  BP: (!) 137/96 126/63 117/69 131/75  Pulse:  (!) 123 (!) 117 (!) 108  Resp: (!) 32 (!) 33 17 (!) 25  Temp:      TempSrc:      SpO2:  95% 94% 94%  Weight:      Height:        Intake/Output Summary (Last 24 hours) at 12/17/2019 1115 Last data filed at 12/17/2019 0548 Gross per 24 hour  Intake 2042.16 ml  Output 700 ml  Net 1342.16 ml    Filed Weights   12/16/19 1216 12/16/19 1800 12/17/19 0500  Weight: 58.9 kg 67.9 kg 66.2 kg    Examination: General exam: Alert, awake, oriented x 1 and demonstrating poor insight in the setting of underlying dementia.  Currently afebrile and denying chest pain.  No nausea or vomiting.  Per nursing staff experiencing intermittent episode of agitation. Respiratory system: Decreased breath sounds at the bases with positive rhonchi's (right more than left).  Positive expiratory wheezing; using accessory muscles. Cardiovascular system: Irregular irregular, no rubs, no gallops, no JVD on exam. Gastrointestinal system: Abdomen is nondistended, soft and nontender. No organomegaly or masses felt. Normal bowel sounds heard. Central nervous system: Alert and oriented. No focal neurological deficits. Extremities: No cyanosis or clubbing. Skin: No rashes, petechiae. Psychiatry: Intermittent episode of agitation reported by nursing staff; poor insight in the setting of underlying dementia.   Data Reviewed: I have personally reviewed following labs and imaging studies  CBC: Recent Labs  Lab 12/16/19 1238 12/17/19 0438  WBC 13.4* 10.1  NEUTROABS 11.3*  --   HGB 12.6 10.8*  HCT 39.1 34.4*  MCV 92.2 92.0  PLT 219 0000000   Basic Metabolic Panel: Recent Labs  Lab 12/16/19 1238 12/17/19 0438  NA 139 139  K 3.4* 3.9  CL 101 107  CO2 27 23  GLUCOSE 225* 313*  BUN 18 28*  CREATININE 0.70 0.86  CALCIUM 9.0 8.2*  MG 1.8  --    GFR: Estimated Creatinine Clearance: 49.7 mL/min (by C-G formula based on SCr of 0.86 mg/dL).   Liver Function Tests: Recent Labs  Lab 12/16/19 1238  AST 17  ALT 17  ALKPHOS 87  BILITOT 1.0  PROT 7.3  ALBUMIN 3.5   HbA1C: Recent Labs    12/16/19 1921  HGBA1C 6.8*   CBG: Recent Labs  Lab 12/15/19 1451 12/16/19 1223 12/16/19 2144 12/17/19 0802  GLUCAP 215* 200* 325* 270*   Thyroid Function Tests: Recent Labs    12/16/19 1238  TSH 1.774    Urine analysis:    Component Value Date/Time   COLORURINE YELLOW 12/17/2019 0602   APPEARANCEUR CLEAR 12/17/2019 0602   LABSPEC 1.021 12/17/2019 0602   PHURINE 5.0 12/17/2019 0602   GLUCOSEU >=500 (A) 12/17/2019 0602   HGBUR NEGATIVE 12/17/2019 0602   BILIRUBINUR NEGATIVE 12/17/2019 0602   KETONESUR 5 (A) 12/17/2019 0602   PROTEINUR 100 (A) 12/17/2019 0602   UROBILINOGEN 0.2 01/23/2013 2317   NITRITE NEGATIVE 12/17/2019 0602   LEUKOCYTESUR NEGATIVE 12/17/2019 0602    Recent Results (from the past 240 hour(s))  Respiratory Panel by RT PCR (Flu A&B, Covid) - Nasopharyngeal Swab     Status: None   Collection Time: 12/16/19 12:56 PM   Specimen: Nasopharyngeal Swab  Result Value Ref Range Status   SARS Coronavirus 2 by RT PCR NEGATIVE NEGATIVE Final    Comment: (NOTE) SARS-CoV-2 target nucleic acids are NOT DETECTED. The SARS-CoV-2 RNA is generally detectable in upper respiratoy specimens during the acute phase of infection. The lowest concentration of SARS-CoV-2 viral copies this assay can detect is 131  copies/mL. A negative result does not preclude SARS-Cov-2 infection and should not be used as the sole basis for treatment or other patient management decisions. A negative result may occur with  improper specimen collection/handling, submission of specimen other than nasopharyngeal swab, presence of viral mutation(s) within the areas targeted by this assay, and inadequate number of viral copies (<131 copies/mL). A negative result must be combined with clinical observations, patient history, and epidemiological information. The expected result is Negative. Fact Sheet for Patients:  PinkCheek.be Fact Sheet for Healthcare Providers:  GravelBags.it This test is not yet ap proved or cleared by the [place] FDA and  has been authorized for detection and/or diagnosis of SARS-CoV-2 by FDA under an Emergency Use  Authorization (EUA). This EUA will remain  in effect (meaning this test can be used) for the duration of the COVID-19 declaration under Section 564(b)(1) of the Act, 21 U.S.C. section 360bbb-3(b)(1), unless the authorization is terminated or revoked sooner.    Influenza A by PCR NEGATIVE NEGATIVE Final   Influenza B by PCR NEGATIVE NEGATIVE Final    Comment: (NOTE) The Xpert Xpress SARS-CoV-2/FLU/RSV assay is intended as an aid in  the diagnosis of influenza from Nasopharyngeal swab specimens and  should not be used as a sole basis for treatment. Nasal washings and  aspirates are unacceptable for Xpert Xpress SARS-CoV-2/FLU/RSV  testing. Fact Sheet for Patients: PinkCheek.be Fact Sheet for Healthcare Providers: GravelBags.it This test is not yet approved or cleared by the [place] FDA and  has been authorized for detection and/or diagnosis of SARS-CoV-2 by  FDA under an Emergency Use Authorization (EUA). This EUA will remain  in effect (meaning this test can be used) for the duration of the  Covid-19 declaration under Section 564(b)(1) of the Act, 21  U.S.C. section 360bbb-3(b)(1), unless the authorization is  terminated or revoked. Performed at [facility], [address], [place], [place] [address]   MRSA PCR Screening     Status: None   Collection Time: 12/16/19  5:38 PM   Specimen: Nasal Mucosa; Nasopharyngeal  Result Value Ref Range Status   MRSA by PCR NEGATIVE NEGATIVE Final    Comment:        The GeneXpert MRSA Assay (FDA approved for NASAL specimens only), is one component of a comprehensive MRSA colonization surveillance program. It is not intended to diagnose MRSA infection nor to guide or monitor treatment for MRSA infections. Performed at [facility], [address], [place], [place] [address]      Radiology Studies: [facility] 1 View  Result Date: 12/16/2019 CLINICAL DATA:  Short of breath,  cough and wheezing. EXAM: PORTABLE CHEST 1 VIEW COMPARISON:  02/04/2019 FINDINGS: Cardiac silhouette is normal in size. No mediastinal or hilar masses. Subtle hazy airspace opacities noted at the right lung base and lateral to the right hilum. Lungs otherwise clear. No convincing pleural effusion.  No pneumothorax. Skeletal structures are demineralized but grossly intact. IMPRESSION: Small subtle areas of hazy airspace opacity in the right mid and lower lung consistent with multifocal pneumonia in the proper clinical setting. Electronically Signed   By: Lajean Manes M.D.   On: 12/16/2019 13:21    Scheduled Meds: . anastrozole  1 mg Oral Daily  . azithromycin  500 mg Oral Daily  . Chlorhexidine Gluconate Cloth  6 each Topical Daily  . donepezil  5 mg Oral QHS  . enoxaparin (LOVENOX) injection  40 mg Subcutaneous Q24H  . insulin aspart  0-15 Units Subcutaneous  TID WC  . insulin aspart  0-5 Units Subcutaneous QHS  . methylPREDNISolone (SOLU-MEDROL) injection  40 mg Intravenous Q12H  . risperiDONE  0.5 mg Oral BID  . simvastatin  40 mg Oral QPM   Continuous Infusions: . cefTRIAXone (ROCEPHIN)  IV    . diltiazem (CARDIZEM) infusion 15 mg/hr (12/17/19 0733)     LOS: 1 day    Time spent: 35 minutes. Greater than 50% of this time was spent in direct contact with the patient, coordinating care and discussing relevant ongoing clinical issues, including discussion about asthma exacerbation Reactive airway disease in the setting of community-acquired pneumonia.  We also discussed the bowel atrial fibrillation with RVR and need for Cardizem drip.  Patient reports no chest pain, no nausea, no vomiting.  Poor insight secondary to underlying dementia.  Remains in a stepdown.     Barton Dubois, MD [facility] Pager [phone]   12/17/2019, 11:15 AM

## 2019-12-17 NOTE — Progress Notes (Signed)
MD [place] notified of patient elevated heart rate. Metoprolol ordered and administered as ordered.

## 2019-12-17 NOTE — Progress Notes (Signed)
Patient remains confused and attempts to get out of bed, removing safety mitts, and pulling at leads and lines. Patient given PO fluids and medications with no issues. Patient HR remains elevated after 2 doses of Metoprolol and Cardizem continuously infusing. MD notified. Awaiting orders.

## 2019-12-17 NOTE — Progress Notes (Signed)
*  PRELIMINARY RESULTS* Echocardiogram 2D Echocardiogram has been performed.  Leavy Cella 12/17/2019, 8:46 AM

## 2019-12-17 NOTE — Progress Notes (Signed)
Patient bladder scanned and resulted more than 499 in bladder. MD paged for I&O order.

## 2019-12-17 NOTE — Progress Notes (Signed)
Patient stool sample rejected due to being formed and not meeting qualifications for [facility] concern.

## 2019-12-17 NOTE — Progress Notes (Signed)
700 output of urine with I & O. Patient tolerated procedure well.Amy Hoffman

## 2019-12-18 ENCOUNTER — Encounter (HOSPITAL_COMMUNITY): Payer: Self-pay | Admitting: Internal Medicine

## 2019-12-18 DIAGNOSIS — J45901 Unspecified asthma with (acute) exacerbation: Secondary | ICD-10-CM

## 2019-12-18 DIAGNOSIS — F039 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: Secondary | ICD-10-CM | POA: Diagnosis not present

## 2019-12-18 DIAGNOSIS — I4892 Unspecified atrial flutter: Secondary | ICD-10-CM

## 2019-12-18 LAB — GLUCOSE, CAPILLARY
Glucose-Capillary: 272 mg/dL — ABNORMAL HIGH (ref 70–99)
Glucose-Capillary: 252 mg/dL — ABNORMAL HIGH (ref 70–99)
Glucose-Capillary: 283 mg/dL — ABNORMAL HIGH (ref 70–99)
Glucose-Capillary: 196 mg/dL — ABNORMAL HIGH (ref 70–99)

## 2019-12-18 MED ORDER — LORAZEPAM 2 MG/ML IJ SOLN
0.5000 mg | Freq: Three times a day (TID) | INTRAMUSCULAR | Status: DC | PRN
  Administered 2019-12-18 – 2019-12-20 (×3): 0.5 mg via INTRAVENOUS
  Filled 2019-12-18 (×3): qty 1

## 2019-12-18 MED ORDER — METOPROLOL TARTRATE 25 MG PO TABS
25.0000 mg | ORAL_TABLET | Freq: Once | ORAL | Status: AC
  Administered 2019-12-18: 25 mg via ORAL
  Filled 2019-12-18: qty 1

## 2019-12-18 MED ORDER — AMIODARONE HCL IN DEXTROSE 360-4.14 MG/200ML-% IV SOLN
30.0000 mg/h | INTRAVENOUS | Status: DC
  Administered 2019-12-18 (×2): 30 mg/h via INTRAVENOUS
  Filled 2019-12-18 (×2): qty 200

## 2019-12-18 MED ORDER — AMIODARONE HCL IN DEXTROSE 360-4.14 MG/200ML-% IV SOLN
60.0000 mg/h | INTRAVENOUS | Status: AC
  Administered 2019-12-18: 60 mg/h via INTRAVENOUS
  Filled 2019-12-18: qty 200

## 2019-12-18 MED ORDER — ENOXAPARIN SODIUM 80 MG/0.8ML ~~LOC~~ SOLN
1.0000 mg/kg | Freq: Two times a day (BID) | SUBCUTANEOUS | Status: DC
  Administered 2019-12-18: 65 mg via SUBCUTANEOUS
  Filled 2019-12-18: qty 0.8

## 2019-12-18 MED ORDER — APIXABAN 5 MG PO TABS
5.0000 mg | ORAL_TABLET | Freq: Two times a day (BID) | ORAL | Status: DC
  Administered 2019-12-18 – 2019-12-22 (×8): 5 mg via ORAL
  Filled 2019-12-18 (×8): qty 1

## 2019-12-18 MED ORDER — METOPROLOL TARTRATE 5 MG/5ML IV SOLN
5.0000 mg | INTRAVENOUS | Status: AC | PRN
  Administered 2019-12-18 (×2): 5 mg via INTRAVENOUS
  Filled 2019-12-18 (×2): qty 5

## 2019-12-18 MED ORDER — AMIODARONE LOAD VIA INFUSION
150.0000 mg | Freq: Once | INTRAVENOUS | Status: AC
  Administered 2019-12-18: 150 mg via INTRAVENOUS
  Filled 2019-12-18: qty 83.34

## 2019-12-18 MED ORDER — AMIODARONE IV BOLUS ONLY 150 MG/100ML
150.0000 mg | Freq: Once | INTRAVENOUS | Status: AC
  Administered 2019-12-18: 150 mg via INTRAVENOUS
  Filled 2019-12-18: qty 100

## 2019-12-18 MED ORDER — METOPROLOL TARTRATE 25 MG PO TABS
25.0000 mg | ORAL_TABLET | Freq: Four times a day (QID) | ORAL | Status: DC
  Administered 2019-12-18: 25 mg via ORAL
  Filled 2019-12-18: qty 1

## 2019-12-18 NOTE — Progress Notes (Signed)
MD Dyann Kief notified pt's HR still sustaining 130s despite multiple PO and IV medications throughout last shift. Currently no PRN's for HR. MD placing orders at this time.

## 2019-12-18 NOTE — Evaluation (Signed)
Clinical/Bedside Swallow Evaluation Patient Details  Name: Amy Hoffman MRN: [id number] Date of Birth: 1940/12/31  Today's Date: 12/18/2019 Time: SLP Start Time (ACUTE ONLY): 0831 SLP Stop Time (ACUTE ONLY): 0855 SLP Time Calculation (min) (ACUTE ONLY): 24 min  Past Medical History:  Past Medical History:  Diagnosis Date  . Abnormal uterine and vaginal bleeding, unspecified   . Allergic rhinitis due to pollen   . Anxiety   . Asthma   . Cancer [facility])    breast cancer - right  . Dementia ([name])   . Depression   . Depression   . Diabetes mellitus without complication ([name])   . Essential (primary) hypertension   . Hyperlipidemia, unspecified   . Hypertension   . Major depressive disorder, single episode, unspecified   . Obesity, unspecified   . Pain in joint involving shoulder region   . Reflux esophagitis   . Trigger finger, acquired   . Type 2 diabetes mellitus with hyperglycemia ([name])   . Unspecified asthma with (acute) exacerbation   . Unspecified asthma, uncomplicated   . 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 ([name])   . Unspecified osteoarthritis, unspecified site    Past Surgical History:  Past Surgical History:  Procedure Laterality Date  . CHOLECYSTECTOMY    . MASTECTOMY Right   . RE-EXCISION OF BREAST LUMPECTOMY     HPI:  Amy Hoffman is a 79 y.o. female with medical history significant for diabetes mellitus, hypertension, depression, dementia, breast cancer.   Assessment / Plan / Recommendation Clinical Impression  Clinical swallowing evaluation completed while Pt was seated upright in bed. Pt was alert and pleasantly confused, presenting with overt s/sx of oropharyngeal dysphagia characterized by immediate coughing with thin liquids. She demonstrated immedate and delayed coughing with thin liquids via straw and with sips by cup even with cues to take very small sips. Pt fed herself (when mitts were removed) and consumed trials of NTL without  incident. Pt also consumed puree textures and regular textures without difficulty. Note audible wheezing prior to and following PO trials. Recommend downgrade Pt to NECTAR THICK liquids and continue with regular diet; recommend meds to be administered whole with NTL. ST will continue to follow for diet tolerance and to determine if objective swallowing assessment is indicated. Thank you for this referral. SLP Visit Diagnosis: Dysphagia, unspecified (R13.10)    Aspiration Risk  Moderate aspiration risk    Diet Recommendation Regular;Nectar-thick liquid   Liquid Administration via: Cup;Straw Medication Administration: Whole meds with liquid Supervision: Patient able to self feed Compensations: Minimize environmental distractions;Slow rate;Small sips/bites;Follow solids with liquid Postural Changes: Seated upright at 90 degrees;Remain upright for at least 30 minutes after po intake    Other  Recommendations Oral Care Recommendations: Oral care BID Other Recommendations: Order thickener from pharmacy   Follow up Recommendations 24 hour supervision/assistance      Frequency and Duration min 1 x/week  1 week       Prognosis Prognosis for Safe Diet Advancement: Good      Swallow Study   General Date of Onset: 12/16/19 HPI: Amy Hoffman is a 79 y.o. female with medical history significant for diabetes mellitus, hypertension, depression, dementia, breast cancer. Type of Study: Bedside Swallow Evaluation Previous Swallow Assessment: none in chart Diet Prior to this Study: Regular;Thin liquids Temperature Spikes Noted: No Respiratory Status: Room air History of Recent Intubation: No Behavior/Cognition: Alert;Cooperative;Confused Oral Cavity Assessment: Dry Oral Care Completed by SLP: Recent completion by staff Oral Cavity - Dentition: Adequate natural  dentition Vision: Functional for self-feeding Self-Feeding Abilities: Able to feed self Patient Positioning: Upright in  bed Baseline Vocal Quality: Normal Volitional Cough: Strong Volitional Swallow: Able to elicit    Oral/Motor/Sensory Function Overall Oral Motor/Sensory Function: Within functional limits   Ice Chips Ice chips: Within functional limits   Thin Liquid Thin Liquid: Impaired Presentation: Cup;Straw Pharyngeal  Phase Impairments: Wet Vocal Quality;Multiple swallows;Cough - Immediate;Cough - Delayed    Nectar Thick Nectar Thick Liquid: Within functional limits   Honey Thick Honey Thick Liquid: Not tested   Puree Puree: Within functional limits   Solid     Solid: Within functional limits     Amy Hoffman H. Roddie Mc, CCC-SLP Speech Language Pathologist  Wende Bushy 12/18/2019,9:06 AM

## 2019-12-18 NOTE — Progress Notes (Signed)
Pt constantly attempting to get out of bed and pulling at cords. Many attempts at redirection and family presence but pt non accepting. MD Dyann Kief notified of need of other PRN as Haldol has no response.

## 2019-12-18 NOTE — Progress Notes (Signed)
Inpatient Diabetes Program Recommendations  AACE/ADA: New Consensus Statement on Inpatient Glycemic Control   Target Ranges:  Prepandial:   less than 140 mg/dL      Peak postprandial:   less than 180 mg/dL (1-2 hours)      Critically ill patients:  140 - 180 mg/dL   Results for Amy Hoffman, Amy Hoffman (MRN [id number]) as of 12/18/2019 11:40  Ref. Range 12/17/2019 08:02 12/17/2019 12:11 12/17/2019 16:48 12/17/2019 22:03 12/18/2019 07:13 12/18/2019 11:30  Glucose-Capillary Latest Ref Range: 70 - 99 mg/dL 270 (H) 364 (H) 281 (H) 162 (H) 272 (H) 252 (H)  Results for Amy Hoffman, Amy Hoffman (MRN [id number]) as of 12/18/2019 11:40  Ref. Range 12/16/2019 19:21  Hemoglobin A1C Latest Ref Range: 4.8 - 5.6 % 6.8 (H)   Review of Glycemic Control  Diabetes history: DM2 Outpatient Diabetes medications: Amaryl 8 mg daily Current orders for Inpatient glycemic control: Novolog 0-15 units TID with meals, Novolog 0-5 units QHS; Solumedrol 40 mg Q12H  Inpatient Diabetes Program Recommendations:   Insulin:  If steroids are continued, please consider ordering Lantus 7 units Q24H (based on 66 kg x 0.1 units) and Novolog 3 units TID with meals for meal coverage if patient eats at least 50% of meals.  Thanks, Barnie Alderman, RN, MSN, CDE Diabetes Coordinator Inpatient Diabetes Program [phone] (Team Pager from 8am to 5pm)

## 2019-12-18 NOTE — Progress Notes (Signed)
MD [place] notified of patient elevated heart rate 135-150 even after Cardizem at max, Metoprolol, and Digoxin administered as ordered. Awaiting orders or further instructions at this time.

## 2019-12-18 NOTE — Progress Notes (Signed)
Bladder scan revealed >250. MD [facility] notified, per protocol do another in and out. This will make three in and out catheters in 24 hours.

## 2019-12-18 NOTE — Progress Notes (Signed)
PROGRESS NOTE    Amy Hoffman  [id number] DOB: August 13, 1941 DOA: 12/16/2019 PCP: [name], NP     Brief Narrative:  As per H&P written by Dr. Denton Brick on 12/16/2019 79 y.o. female with medical history significant for diabetes mellitus, hypertension, depression, dementia, breast cancer. Patient presented to the ED with complaints of difficulty breathing and wheezing over the past 2 days.  Daughter present at bedside helps with most of the history.  She reports patient has some mild diarrhea previously. No fevers, no chills, no vomiting.  No lower extremity swelling.  No chest pain.  Denies dysuria.  No dizziness.  She is unaware of a fast heart rate.  Patient was in the ED yesterday with complaints of hyperglycemia but blood sugars were 215, also with increased sleepiness, and diarrhea.  But patient left AMA, with her caregiver, per notes because "it was taking too long".  ED Course: O2 sats with ambulation down to 85%, with increasing respiratory distress.  Heart rate initially mostly 80s to 109.  But on my evaluation patient's heart rate jumped up to the 180s.  Patient had just completed an hour-long nebulizer treatment.  WBC 13.4.  Chest x-ray suggests multifocal pneumonia.  IV ceftriaxone given.  Hospitalist to admit for asthma and pneumonia.   Assessment & Plan: 1-reactive airway disease/asthma exacerbation: In the setting of community-acquired multifocal pneumonia. -Continue current antibiotics -Continue to follow culture results -Continue supportive care -Continue nebulizer management and steroids -follow clinical response.  2-A. fib with RVR -Heart rate has remained irregular and difficult to control; patient has failed management with the use of Cardizem drip (max), metoprolol and digoxin. -Cardizem will be discontinued -Start amiodarone drip -No prior history of it -cardiology service consulted and will follow recommendation.. -Continue as needed  beta-blocker. -Lovenox full dose initiated for anticoagulation therapy. -CHADsVASC score 2-3  3-type 2 diabetes mellitus -With hyperglycemia in the setting of steroids usage -A1c 6.8 -Continue sliding scale insulin and add lantus.  4-dementia with behavioral disturbances -Continue Aricept -Continue Risperdal -Continue supportive care and constant reorientation  -Continue as needed Tylenol.  5-hyperlipidemia -Continue Zocor at time of discharge -Currently on hold to minimize the chances of QT prolongation while using amiodarone and as needed Haldol..  6-hx of breast cancer -continue Arimidex.  DVT prophylaxis: Lovenox Code Status: Full code Family Communication: No family at bedside.  Husband updated over the phone. Disposition Plan: Continue nebulizer management, continue steroids, continue antibiotics and given failure to cardizem, metoprolol and digoxin, will start amiodarone drip. Cardiology consulted and will follow recommendations.   Consultants:   Cardiology  Procedures:   See below for x-ray report  2D echo:  1. Left ventricular ejection fraction, by estimation, is 50 to 55%. The  left ventricle has low normal function. The left ventricle has no regional  wall motion abnormalities. There is mild concentric left ventricular  hypertrophy. Left ventricular  diastolic function could not be evaluated.  2. Right ventricular systolic function is normal. The right ventricular  size is normal. There is normal pulmonary artery systolic pressure. The  estimated right ventricular systolic pressure is [id number] mmHg.  3. Left atrial size was mildly dilated.  4. The mitral valve is normal in structure. Trivial mitral valve  regurgitation.  5. The aortic valve is normal in structure. Aortic valve regurgitation is  not visualized. No aortic stenosis is present.  6. The inferior vena cava is dilated in size with <50% respiratory  variability, suggesting right atrial pressure  of 15  mmHg.   Antimicrobials:  Anti-infectives (From admission, onward)   Start     Dose/Rate Route Frequency Ordered Stop   12/17/19 1500  azithromycin (ZITHROMAX) tablet 500 mg     500 mg Oral Daily 12/16/19 1819     12/17/19 1500  cefTRIAXone (ROCEPHIN) 1 g in sodium chloride 0.9 % 100 mL IVPB     1 g 200 mL/hr over 30 Minutes Intravenous Every 24 hours 12/16/19 1819     12/16/19 1530  cefTRIAXone (ROCEPHIN) 1 g in sodium chloride 0.9 % 100 mL IVPB     1 g 200 mL/hr over 30 Minutes Intravenous  Once 12/16/19 1520 12/16/19 1618   12/16/19 1530  azithromycin (ZITHROMAX) tablet 500 mg     500 mg Oral  Once 12/16/19 1520 12/16/19 1538       Subjective: Improve in her breathing overall; denying chest pain and palpitations.  Heart rate still difficult to control despite the use of Cardizem drip, metoprolol and digoxin. At rest heart rate remains in the 130s to 140s.  Objective: Vitals:   12/18/19 1200 12/18/19 1215 12/18/19 1330 12/18/19 1345  BP: (!) 151/76 (!) 143/75 132/76 131/74  Pulse: (!) 135 (!) 136 (!) 142 (!) 142  Resp: 19 20 19 20   Temp:      TempSrc:      SpO2: 95% 94% 93% 95%  Weight:      Height:        Intake/Output Summary (Last 24 hours) at 12/18/2019 1406 Last data filed at 12/18/2019 1300 Gross per 24 hour  Intake 829.5 ml  Output 1600 ml  Net -770.5 ml   Filed Weights   12/17/19 0500 12/18/19 0300 12/18/19 0742  Weight: 66.2 kg 68 kg 66 kg    Examination: General exam: Alert, awake, oriented x 1, with no chest pain or complaints of palpitations.  Heart rate has remained uncontrolled and sustained in the 130s-140s range.  No fever, no nausea, no vomiting.  Per nursing staff intermittently agitated. Respiratory system: Mild expiratory wheezing, able to speak in full sentences with good oxygen saturation on room air.  Positive rhonchi bilaterally.  No crackles Cardiovascular system: Irregular irregular, no rubs, no gallops, no JVD. Gastrointestinal system:  Abdomen is nondistended, soft and nontender. No organomegaly or masses felt. Normal bowel sounds heard. Central nervous system: Alert and oriented. No focal neurological deficits. Extremities: No cyanosis or clubbing. Skin: No rashes, no petechiae. Psychiatry: Judgement and insight appear impaired in the setting of underlying dementia; Mood & affect appropriate currently.    Data Reviewed: I have personally reviewed following labs and imaging studies  CBC: Recent Labs  Lab 12/16/19 1238 12/17/19 0438  WBC 13.4* 10.1  NEUTROABS 11.3*  --   HGB 12.6 10.8*  HCT 39.1 34.4*  MCV 92.2 92.0  PLT 219 0000000   Basic Metabolic Panel: Recent Labs  Lab 12/16/19 1238 12/17/19 0438  NA 139 139  K 3.4* 3.9  CL 101 107  CO2 27 23  GLUCOSE 225* 313*  BUN 18 28*  CREATININE 0.70 0.86  CALCIUM 9.0 8.2*  MG 1.8  --    GFR: Estimated Creatinine Clearance: 49.6 mL/min (by C-G formula based on SCr of 0.86 mg/dL).   Liver Function Tests: Recent Labs  Lab 12/16/19 1238  AST 17  ALT 17  ALKPHOS 87  BILITOT 1.0  PROT 7.3  ALBUMIN 3.5   HbA1C: Recent Labs    12/16/19 1921  HGBA1C 6.8*   CBG: Recent Labs  Lab 12/17/19 1211 12/17/19 1648 12/17/19 2203 12/18/19 0713 12/18/19 1130  GLUCAP 364* 281* 162* 272* 252*   Thyroid Function Tests: Recent Labs    12/16/19 1238  TSH 1.774   Urine analysis:    Component Value Date/Time   COLORURINE YELLOW 12/17/2019 0602   APPEARANCEUR CLEAR 12/17/2019 0602   LABSPEC 1.021 12/17/2019 0602   PHURINE 5.0 12/17/2019 0602   GLUCOSEU >=500 (A) 12/17/2019 0602   HGBUR NEGATIVE 12/17/2019 0602   BILIRUBINUR NEGATIVE 12/17/2019 0602   KETONESUR 5 (A) 12/17/2019 0602   PROTEINUR 100 (A) 12/17/2019 0602   UROBILINOGEN 0.2 01/23/2013 2317   NITRITE NEGATIVE 12/17/2019 0602   LEUKOCYTESUR NEGATIVE 12/17/2019 0602    Recent Results (from the past 240 hour(s))  Respiratory Panel by RT PCR (Flu A&B, Covid) - Nasopharyngeal Swab      Status: None   Collection Time: 12/16/19 12:56 PM   Specimen: Nasopharyngeal Swab  Result Value Ref Range Status   SARS Coronavirus 2 by RT PCR NEGATIVE NEGATIVE Final    Comment: (NOTE) SARS-CoV-2 target nucleic acids are NOT DETECTED. The SARS-CoV-2 RNA is generally detectable in upper respiratoy specimens during the acute phase of infection. The lowest concentration of SARS-CoV-2 viral copies this assay can detect is 131 copies/mL. A negative result does not preclude SARS-Cov-2 infection and should not be used as the sole basis for treatment or other patient management decisions. A negative result may occur with  improper specimen collection/handling, submission of specimen other than nasopharyngeal swab, presence of viral mutation(s) within the areas targeted by this assay, and inadequate number of viral copies (<131 copies/mL). A negative result must be combined with clinical observations, patient history, and epidemiological information. The expected result is Negative. Fact Sheet for Patients:  PinkCheek.be Fact Sheet for Healthcare Providers:  GravelBags.it This test is not yet ap proved or cleared by the [place] FDA and  has been authorized for detection and/or diagnosis of SARS-CoV-2 by FDA under an Emergency Use Authorization (EUA). This EUA will remain  in effect (meaning this test can be used) for the duration of the COVID-19 declaration under Section 564(b)(1) of the Act, 21 U.S.C. section 360bbb-3(b)(1), unless the authorization is terminated or revoked sooner.    Influenza A by PCR NEGATIVE NEGATIVE Final   Influenza B by PCR NEGATIVE NEGATIVE Final    Comment: (NOTE) The Xpert Xpress SARS-CoV-2/FLU/RSV assay is intended as an aid in  the diagnosis of influenza from Nasopharyngeal swab specimens and  should not be used as a sole basis for treatment. Nasal washings and  aspirates are unacceptable for  Xpert Xpress SARS-CoV-2/FLU/RSV  testing. Fact Sheet for Patients: PinkCheek.be Fact Sheet for Healthcare Providers: GravelBags.it This test is not yet approved or cleared by the [place] FDA and  has been authorized for detection and/or diagnosis of SARS-CoV-2 by  FDA under an Emergency Use Authorization (EUA). This EUA will remain  in effect (meaning this test can be used) for the duration of the  Covid-19 declaration under Section 564(b)(1) of the Act, 21  U.S.C. section 360bbb-3(b)(1), unless the authorization is  terminated or revoked. Performed at [facility], [address], [place], [place] [address]   MRSA PCR Screening     Status: None   Collection Time: 12/16/19  5:38 PM   Specimen: Nasal Mucosa; Nasopharyngeal  Result Value Ref Range Status   MRSA by PCR NEGATIVE NEGATIVE Final    Comment:        The GeneXpert MRSA Assay (  FDA approved for NASAL specimens only), is one component of a comprehensive MRSA colonization surveillance program. It is not intended to diagnose MRSA infection nor to guide or monitor treatment for MRSA infections. Performed at [facility], [address] [place], [place] [address]      Radiology Studies: ECHOCARDIOGRAM COMPLETE  Result Date: 12/17/2019    ECHOCARDIOGRAM REPORT   Patient Name:   Amy Hoffman Date of Exam: 12/17/2019 Medical Rec #:  [id number]       Height:       64.0 in Accession #:    [id number]      Weight:       145.9 lb Date of Birth:  1941-06-08       BSA:          66.711 m Patient Age:    18 years        BP:           115/73 mmHg Patient Gender: F               HR:           126 bpm. Exam Location:  [facility] Procedure: 2D Echo Indications:    Atrial Fibrillation 427.31 / I48.91  History:        Patient has prior history of Echocardiogram examinations, most                 recent 01/10/2014. Arrythmias:Atrial Fibrillation; Risk                 Factors:Diabetes,  Non-Smoker, Hypertension and Dyslipidemia.                 Major depressive disorder, Breast Cancer.  Sonographer:    Leavy Cella RDCS (AE) Referring Phys: [place]  1. Left ventricular ejection fraction, by estimation, is 50 to 55%. The left ventricle has low normal function. The left ventricle has no regional wall motion abnormalities. There is mild concentric left ventricular hypertrophy. Left ventricular diastolic function could not be evaluated.  2. Right ventricular systolic function is normal. The right ventricular size is normal. There is normal pulmonary artery systolic pressure. The estimated right ventricular systolic pressure is [id number] mmHg.  3. Left atrial size was mildly dilated.  4. The mitral valve is normal in structure. Trivial mitral valve regurgitation.  5. The aortic valve is normal in structure. Aortic valve regurgitation is not visualized. No aortic stenosis is present.  6. The inferior vena cava is dilated in size with <50% respiratory variability, suggesting right atrial pressure of 15 mmHg. Comparison(s): Prior images unable to be directly viewed, comparison made by report only. No significant change from prior study. FINDINGS  Left Ventricle: Left ventricular ejection fraction, by estimation, is 50 to 55%. The left ventricle has low normal function. The left ventricle has no regional wall motion abnormalities. The left ventricular internal cavity size was normal in size. There is mild concentric left ventricular hypertrophy. Left ventricular diastolic function could not be evaluated due to atrial fibrillation. Left ventricular diastolic function could not be evaluated. Right Ventricle: The right ventricular size is normal. No increase in right ventricular wall thickness. Right ventricular systolic function is normal. There is normal pulmonary artery systolic pressure. The tricuspid regurgitant velocity is 1.90 m/s, and  with an assumed right atrial pressure  of 15 mmHg, the estimated right ventricular systolic pressure is [id number] mmHg. Left Atrium: Left atrial size was mildly dilated. Right Atrium: Right atrial size was  normal in size. Pericardium: There is no evidence of pericardial effusion. Mitral Valve: The mitral valve is normal in structure. Trivial mitral valve regurgitation. Tricuspid Valve: The tricuspid valve is normal in structure. Tricuspid valve regurgitation is trivial. Aortic Valve: The aortic valve is normal in structure. Aortic valve regurgitation is not visualized. No aortic stenosis is present. Pulmonic Valve: The pulmonic valve was not well visualized. Pulmonic valve regurgitation is not visualized. Aorta: The aortic root is normal in size and structure. Venous: The inferior vena cava is dilated in size with less than 50% respiratory variability, suggesting right atrial pressure of 15 mmHg. IAS/Shunts: No atrial level shunt detected by color flow Doppler.  LEFT VENTRICLE PLAX 2D LVIDd:         3.25 cm  Diastology LVIDs:         2.73 cm  LV e' lateral:   8.25 cm/s LV PW:         1.36 cm  LV E/e' lateral: 11.7 LV IVS:        1.12 cm  LV e' medial:    7.15 cm/s LVOT diam:     1.90 cm  LV E/e' medial:  13.5 LV SV:         42 LV SV Index:   24 LVOT Area:     2.84 cm  RIGHT VENTRICLE RV S prime:     10.10 cm/s TAPSE (M-mode): 1.8 cm LEFT ATRIUM             Index LA diam:        3.80 cm 2.22 cm/m LA Vol (A2C):   37.3 ml 21.80 ml/m LA Vol (A4C):   31.0 ml 18.12 ml/m LA Biplane Vol: 34.2 ml 19.99 ml/m  AORTIC VALVE LVOT Vmax:   79.60 cm/s LVOT Vmean:  55.200 cm/s LVOT VTI:    0.147 m  AORTA Ao Root diam: 2.50 cm MITRAL VALVE               TRICUSPID VALVE MV Area (PHT): 5.23 cm    TR Peak grad:   14.4 mmHg MV Decel Time: 145 msec    TR Vmax:        190.00 cm/s MV E velocity: 96.40 cm/s MV A velocity: 36.00 cm/s  SHUNTS MV E/A ratio:  2.68        Systemic VTI:  0.15 m                            Systemic Diam: 1.90 cm Dani Gobble Croitoru MD Electronically signed  by Sanda Klein MD Signature Date/Time: 12/17/2019/12:09:40 PM    Final     Scheduled Meds: . anastrozole  1 mg Oral Daily  . azithromycin  500 mg Oral Daily  . budesonide (PULMICORT) nebulizer solution  0.5 mg Nebulization BID  . Chlorhexidine Gluconate Cloth  6 each Topical Daily  . donepezil  5 mg Oral QHS  . enoxaparin (LOVENOX) injection  1 mg/kg Subcutaneous Q12H  . insulin aspart  0-15 Units Subcutaneous TID WC  . insulin aspart  0-5 Units Subcutaneous QHS  . methylPREDNISolone (SOLU-MEDROL) injection  40 mg Intravenous Q12H  . risperiDONE  0.5 mg Oral BID   Continuous Infusions: . amiodarone 60 mg/hr (12/18/19 0849)  . amiodarone    . cefTRIAXone (ROCEPHIN)  IV Stopped (12/17/19 1759)     LOS: 2 days    Time spent: 35 minutes.  More than 50% of the time spent in direct  contact with the patient, coordinating care and discussing relevant ongoing clinical issues used with nursing staff and consulting physicians.  Patient remains in stepdown, continue to have A. fib with RVR that has been unresponsive to Cardizem drip, metoprolol, and digoxin.  Breathing and albuterol COPD exacerbation/pneumonia improved.    Barton Dubois, MD [facility] Pager [phone]   12/18/2019, 2:06 PM

## 2019-12-18 NOTE — Progress Notes (Signed)
ANTICOAGULATION CONSULT NOTE - Initial Consult  Pharmacy Consult for Apixaban Indication: atrial fibrillation  Allergies  Allergen Reactions  . Codeine   . Zantac [Ranitidine Hcl]     Patient Measurements: Height: 5\' 4"  (162.6 cm) Weight: 66 kg (145 lb 8.1 oz) IBW/kg (Calculated) : 54.7   Vital Signs: Temp: 98.3 F (36.8 C) (04/05 1129) Temp Source: Oral (04/05 1129) BP: 158/74 (04/05 1500) Pulse Rate: 140 (04/05 1500)  Labs: Recent Labs    12/16/19 1238 12/16/19 1921 12/17/19 0438  HGB 12.6  --  10.8*  HCT 39.1  --  34.4*  PLT 219  --  204  CREATININE 0.70  --  0.86  TROPONINIHS 15 29*  --     Estimated Creatinine Clearance: 49.6 mL/min (by C-G formula based on SCr of 0.86 mg/dL).   Medical History: Past Medical History:  Diagnosis Date  . Abnormal uterine and vaginal bleeding, unspecified   . Allergic rhinitis due to pollen   . Anxiety   . Asthma   . Cancer [facility])    breast cancer - right  . Dementia ([place])   . Depression   . Depression   . Diabetes mellitus without complication ([place])   . Essential (primary) hypertension   . Hyperlipidemia, unspecified   . Hypertension   . Major depressive disorder, single episode, unspecified   . Obesity, unspecified   . Pain in joint involving shoulder region   . Reflux esophagitis   . Trigger finger, acquired   . Type 2 diabetes mellitus with hyperglycemia ([place])   . Unspecified asthma with (acute) exacerbation   . Unspecified asthma, uncomplicated   . 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 ([place])   . Unspecified osteoarthritis, unspecified site     Medications:  Medications Prior to Admission  Medication Sig Dispense Refill Last Dose  . amLODipine (NORVASC) 5 MG tablet Take 5 mg by mouth daily.   12/15/2019 at Unknown time  . anastrozole (ARIMIDEX) 1 MG tablet Take 1 mg by mouth daily.   12/16/2019 at Unknown time  . donepezil (ARICEPT) 5 MG tablet Take 5 mg by mouth at bedtime.   12/16/2019 at Unknown  time  . glimepiride (AMARYL) 4 MG tablet Take 8 mg by mouth every morning.    12/15/2019 at Unknown time  . risperiDONE (RISPERDAL) 0.5 MG tablet Take 0.5 mg by mouth 2 (two) times daily.   12/16/2019 at Unknown time  . simvastatin (ZOCOR) 40 MG tablet Take 40 mg by mouth every evening.   12/16/2019 at Unknown time    Assessment: 79 y.o. female with a hx of HTN, asthma, breast cancer, dementia with new onset a fib with RVR . Pharmacy asked to dose eliquis. 79 yo , 66kg and Scr 0.86  Goal of Therapy:  Monitor platelets by anticoagulation protocol: Yes   Plan:  eliquis 5mg  po bid Educate on eliquis Monitor for s/s of bleeding  Isac Sarna, BS Vena Austria, BCPS Clinical Pharmacist Pager [phone] 12/18/2019,3:52 PM

## 2019-12-18 NOTE — Progress Notes (Signed)
MD, Darrick Meigs, contacted in regards to elevated HR after digoxin administration at 2238. Instructed to monitor HR for another hour, 0115 and [place]ski if HR remains elevated and sustains.

## 2019-12-18 NOTE — Progress Notes (Signed)
MD, Darrick Meigs, contacted in regards to elevated HR after digoxin administration at 2238. Instructed to monitor HR for another hour, 0115 and [place] if HR remains elevated and sustains. HR has not decreased so metoprolol po has been incorporated. Continue to monitor.

## 2019-12-18 NOTE — Progress Notes (Signed)
MD, Darrick Meigs, contacted in regards to elevated HR after all medications ordered had been administered and were not effective. Instructed to monitor HR and 25mg  Metoprolol PO is to be administered again now and then every 6 hours. MD declined suggestion of Amiodarone infusion to replace Cardizem.

## 2019-12-18 NOTE — Progress Notes (Signed)
[facility], Sharlet [place], notified of patients elevated HR despite Amiodarone gtt. Patient HR 135-150. Awaiting response/orders. Continue to monitor.

## 2019-12-18 NOTE — Plan of Care (Signed)
  Problem: SLP Dysphagia Goals Goal: Patient will utilize recommended strategies Description: Patient will utilize recommended strategies during swallow to increase swallowing safety with Flowsheets (Taken 12/18/2019 0912) Patient will utilize recommended strategies during swallow to increase swallowing safety with: mod assist

## 2019-12-18 NOTE — Progress Notes (Signed)
Despite PRN Haldol and mitt placement administration pt has removed several IVs and attempting to get out of bed. MD [facility] notified.

## 2019-12-18 NOTE — Progress Notes (Signed)
Pt removed another IV at this time despite mitts. PRN medication given and two other IV's started.

## 2019-12-18 NOTE — Progress Notes (Signed)
Pt's mitts reapplied after eating d/t pt removing another IV. Educated pt on importance of IV and why we were having to use mitts again.

## 2019-12-18 NOTE — Care Management Important Message (Signed)
Important Message  Patient Details  Name: Amy Hoffman MRN: [id number] Date of Birth: [birth year]   Medicare Important Message Given:  Yes     Tommy Medal 12/18/2019, 12:42 PM

## 2019-12-18 NOTE — Progress Notes (Signed)
Bladder scanner showing 238mL, MD [place] notified as pt has had 3 in and out catheters. Foley ordered at this time.

## 2019-12-18 NOTE — Consult Note (Addendum)
Cardiology Consultation:   Patient ID: Amy Hoffman MRN: [id number]; DOB: 1940/11/14  Admit date: 12/16/2019 Date of Consult: 12/18/2019  Primary Care Provider: [name], NP Primary Cardiologist: No primary care provider on file. New remote Dr. Harl Bowie  Primary Electrophysiologist:  None    Patient Profile:   Amy Hoffman is a 79 y.o. female with a hx of HTN, asthma, breast cancer, dementia who is being seen today for the evaluation of a fib with RVR at the request of Dr. Dyann Kief.  History of Present Illness:   Ms. Klutz remotely saw Dr. Harl Bowie in 2015 for SOB, and she had a normal Echo.  Recently pt had hyperglycemia and was seen in ER 12/15/19 then 12/16/19 she presented with SOB and wheezing.  She has a hx of dementia as well.   Her SP02 was at 85% in ER.  CXR with multifocal pna. IV ABX given.    1st EKG SR then 1604 to A fib with RVR after nebulizer treatment   EKG:  The EKG was personally reviewed and demonstrates:  ST at 109 no acute sT changes,  Follow up at 1604 with a fib and RVR at 171, and ST depression due to HR.  Telemetry:  Telemetry was personally reviewed and demonstrates:  A fib RVR  BNP 196,  Hs troponin 15; 29  Na 139, K+ 3.4 12/16/19 and 4/4 3.9  glucose elevated at 313  Mg+ 1.8 LFTs wnl  Hgb 10.8, WBC 10 plts 204  Hgb A1c of 6.8 Echo with EF 50-55% LA mildly dilated mild concentric LVH   BP 173/93 139/80 P 136   + 706 down from + 1419  Wt 66 Kg down from 67.9 K   Past Medical History:  Diagnosis Date  . Abnormal uterine and vaginal bleeding, unspecified   . Allergic rhinitis due to pollen   . Anxiety   . Asthma   . Cancer [facility])    breast cancer - right  . Dementia ([place])   . Depression   . Depression   . Diabetes mellitus without complication ([place])   . Essential (primary) hypertension   . Hyperlipidemia, unspecified   . Hypertension   . Major depressive disorder, single episode, unspecified   . Obesity, unspecified   . Pain in joint  involving shoulder region   . Reflux esophagitis   . Trigger finger, acquired   . Type 2 diabetes mellitus with hyperglycemia ([place])   . Unspecified asthma with (acute) exacerbation   . Unspecified asthma, uncomplicated   . 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 ([place])   . Unspecified osteoarthritis, unspecified site     Past Surgical History:  Procedure Laterality Date  . CHOLECYSTECTOMY    . MASTECTOMY Right   . RE-EXCISION OF BREAST LUMPECTOMY       Home Medications:  Prior to Admission medications   Medication Sig Start Date End Date Taking? Authorizing Provider  amLODipine (NORVASC) 5 MG tablet Take 5 mg by mouth daily. 12/26/18  Yes [provider]  anastrozole (ARIMIDEX) 1 MG tablet Take 1 mg by mouth daily.   Yes [provider]  donepezil (ARICEPT) 5 MG tablet Take 5 mg by mouth at bedtime.   Yes [provider]  glimepiride (AMARYL) 4 MG tablet Take 8 mg by mouth every morning.    Yes [provider]  risperiDONE (RISPERDAL) 0.5 MG tablet Take 0.5 mg by mouth 2 (two) times daily.   Yes [provider]  simvastatin (ZOCOR) 40  MG tablet Take 40 mg by mouth every evening.   Yes [provider]    Inpatient Medications: Scheduled Meds: . anastrozole  1 mg Oral Daily  . azithromycin  500 mg Oral Daily  . budesonide (PULMICORT) nebulizer solution  0.5 mg Nebulization BID  . Chlorhexidine Gluconate Cloth  6 each Topical Daily  . donepezil  5 mg Oral QHS  . enoxaparin (LOVENOX) injection  1 mg/kg Subcutaneous Q12H  . insulin aspart  0-15 Units Subcutaneous TID WC  . insulin aspart  0-5 Units Subcutaneous QHS  . methylPREDNISolone (SOLU-MEDROL) injection  40 mg Intravenous Q12H  . risperiDONE  0.5 mg Oral BID  . simvastatin  40 mg Oral QPM   Continuous Infusions: . amiodarone 60 mg/hr (12/18/19 0849)  . amiodarone    . cefTRIAXone (ROCEPHIN)  IV Stopped (12/17/19 1759)   PRN Meds: acetaminophen **OR**  acetaminophen, haloperidol lactate, levalbuterol, ondansetron **OR** ondansetron (ZOFRAN) IV  Allergies:    Allergies  Allergen Reactions  . Codeine   . Zantac [Ranitidine Hcl]     Social History:   Social History   Socioeconomic History  . Marital status: Married    Spouse name: Not on file  . Number of children: Not on file  . Years of education: Not on file  . Highest education level: Not on file  Occupational History  . Not on file  Tobacco Use  . Smoking status: Never Smoker  . Smokeless tobacco: Never Used  Substance and Sexual Activity  . Alcohol use: No  . Drug use: No  . Sexual activity: Not on file  Other Topics Concern  . Not on file  Social History Narrative  . Not on file   Social Determinants of Health   Financial Resource Strain:   . Difficulty of Paying Living Expenses:   Food Insecurity:   . Worried About Charity fundraiser in the Last Year:   . Arboriculturist in the Last Year:   Transportation Needs:   . Film/video editor (Medical):   Marland Kitchen Lack of Transportation (Non-Medical):   Physical Activity:   . Days of Exercise per Week:   . Minutes of Exercise per Session:   Stress:   . Feeling of Stress :   Social Connections:   . Frequency of Communication with Friends and Family:   . Frequency of Social Gatherings with Friends and Family:   . Attends Religious Services:   . Active Member of Clubs or Organizations:   . Attends Archivist Meetings:   Marland Kitchen Marital Status:   Intimate Partner Violence:   . Fear of Current or Ex-Partner:   . Emotionally Abused:   Marland Kitchen Physically Abused:   . Sexually Abused:     Family History:    Family History  Family history unknown: Yes   pt unable to give FH. Due to dementia  ROS:  Please see the history of present illness.  General:no colds or fevers, no weight changes Skin:no rashes or ulcers HEENT:no blurred vision, no congestion CV:see HPI PUL:see HPI GI:no diarrhea constipation or melena,  no indigestion GU:no hematuria, no dysuria MS:no joint pain, no claudication Neuro:no syncope, no lightheadedness, + dementia  Endo:+ diabetes, no thyroid disease  All other ROS reviewed and negative.     Physical Exam/Data:   Vitals:   12/18/19 0742 12/18/19 0800 12/18/19 0845 12/18/19 0900  BP:  124/76 137/71 128/80  Pulse:  (!) 139 (!) 135 (!) 133  Resp:  17  17 (!) 21  Temp:      TempSrc:      SpO2: 98% 96% 97% 97%  Weight: 66 kg     Height:        Intake/Output Summary (Last 24 hours) at 12/18/2019 1004 Last data filed at 12/18/2019 0507 Gross per 24 hour  Intake 564.67 ml  Output 1200 ml  Net -635.33 ml   Last 3 Weights 12/18/2019 12/18/2019 12/17/2019  Weight (lbs) 145 lb 8.1 oz 149 lb 14.6 oz 145 lb 15.1 oz  Weight (kg) 66 kg 68 kg 66.2 kg     Body mass index is 24.98 kg/m.  General:  Frail female, in no acute distress unaware of rapid HR HEENT: normal Lymph: no adenopathy Neck: + JVD Endocrine:  No thryomegaly Vascular: No carotid bruits; pedal pulses 1+ bilaterally without bruits  Cardiac:  Rapid and irreg; no murmur gallup rub or click Lungs:  clear to auscultation bilaterally, no wheezing, rhonchi or rales  Abd: soft, nontender, no hepatomegaly  Ext: no edema Musculoskeletal:  No deformities, BUE and BLE strength normal and equal Skin: warm and dry  Neuro:  oriented to person, knew she was in hospital thought it was [place] and that she was in [place]. , no focal abnormalities noted Psych:  Normal affect pleasant    Relevant CV Studies: Echo 12/17/19 IMPRESSIONS    1. Left ventricular ejection fraction, by estimation, is 50 to 55%. The  left ventricle has low normal function. The left ventricle has no regional  wall motion abnormalities. There is mild concentric left ventricular  hypertrophy. Left ventricular  diastolic function could not be evaluated.  2. Right ventricular systolic function is normal. The right ventricular  size is normal. There is  normal pulmonary artery systolic pressure. The  estimated right ventricular systolic pressure is [id number] mmHg.  3. Left atrial size was mildly dilated.  4. The mitral valve is normal in structure. Trivial mitral valve  regurgitation.  5. The aortic valve is normal in structure. Aortic valve regurgitation is  not visualized. No aortic stenosis is present.  6. The inferior vena cava is dilated in size with <50% respiratory  variability, suggesting right atrial pressure of 15 mmHg.   Comparison(s): Prior images unable to be directly viewed, comparison made  by report only. No significant change from prior study.   FINDINGS  Left Ventricle: Left ventricular ejection fraction, by estimation, is 50  to 55%. The left ventricle has low normal function. The left ventricle has  no regional wall motion abnormalities. The left ventricular internal  cavity size was normal in size.  There is mild concentric left ventricular hypertrophy. Left ventricular  diastolic function could not be evaluated due to atrial fibrillation. Left  ventricular diastolic function could not be evaluated.   Right Ventricle: The right ventricular size is normal. No increase in  right ventricular wall thickness. Right ventricular systolic function is  normal. There is normal pulmonary artery systolic pressure. The tricuspid  regurgitant velocity is 1.90 m/s, and  with an assumed right atrial pressure of 15 mmHg, the estimated right  ventricular systolic pressure is [id number] mmHg.   Left Atrium: Left atrial size was mildly dilated.   Right Atrium: Right atrial size was normal in size.   Pericardium: There is no evidence of pericardial effusion.   Mitral Valve: The mitral valve is normal in structure. Trivial mitral  valve regurgitation.   Tricuspid Valve: The tricuspid valve is normal in structure. Tricuspid  valve regurgitation is  trivial.   Aortic Valve: The aortic valve is normal in structure. Aortic valve    regurgitation is not visualized. No aortic stenosis is present.   Pulmonic Valve: The pulmonic valve was not well visualized. Pulmonic valve  regurgitation is not visualized.   Aorta: The aortic root is normal in size and structure.   Venous: The inferior vena cava is dilated in size with less than 50%  respiratory variability, suggesting right atrial pressure of 15 mmHg.   IAS/Shunts: No atrial level shunt detected by color flow Doppler.    ECHO 01/10/14 Study Conclusions   - Left ventricle: The cavity size was normal. Wall thickness  was increased in a pattern of mild LVH. Systolic function  was normal. The estimated ejection fraction was in the  range of 50% to 55%. Wall motion was normal; there were no  regional wall motion abnormalities. Doppler parameters are  consistent with abnormal left ventricular relaxation  (grade 1 diastolic dysfunction). Doppler parameters are  consistent with elevated mean left atrial filling  pressure.  - Left atrium: The atrium was mildly dilated.  - Tricuspid valve: Mild regurgitation.  - Pulmonary arteries: PA peak pressure: 73mm Hg (S). Mildly  elevated pulmonary pressures.   Laboratory Data:  High Sensitivity Troponin:   Recent Labs  Lab 12/16/19 1238 12/16/19 1921  TROPONINIHS 15 29*     Chemistry Recent Labs  Lab 12/16/19 1238 12/17/19 0438  NA 139 139  K 3.4* 3.9  CL 101 107  CO2 27 23  GLUCOSE 225* 313*  BUN 18 28*  CREATININE 0.70 0.86  CALCIUM 9.0 8.2*  GFRNONAA >60 >60  GFRAA >60 >60  ANIONGAP 11 9    Recent Labs  Lab 12/16/19 1238  PROT 7.3  ALBUMIN 3.5  AST 17  ALT 17  ALKPHOS 87  BILITOT 1.0   Hematology Recent Labs  Lab 12/16/19 1238 12/17/19 0438  WBC 13.4* 10.1  RBC 4.24 3.74*  HGB 12.6 10.8*  HCT 39.1 34.4*  MCV 92.2 92.0  MCH 29.7 28.9  MCHC 32.2 31.4  RDW 13.0 12.9  PLT 219 204   BNP Recent Labs  Lab 12/16/19 1238  BNP 196.0*    DDimer No results for  input(s): DDIMER in the last 168 hours.   Radiology/Studies:  DG Chest Port 1 View  Result Date: 12/16/2019 CLINICAL DATA:  Short of breath, cough and wheezing. EXAM: PORTABLE CHEST 1 VIEW COMPARISON:  02/04/2019 FINDINGS: Cardiac silhouette is normal in size. No mediastinal or hilar masses. Subtle hazy airspace opacities noted at the right lung base and lateral to the right hilum. Lungs otherwise clear. No convincing pleural effusion.  No pneumothorax. Skeletal structures are demineralized but grossly intact. IMPRESSION: Small subtle areas of hazy airspace opacity in the right mid and lower lung consistent with multifocal pneumonia in the proper clinical setting. Electronically Signed   By: Lajean Manes M.D.   On: 12/16/2019 13:21   ECHOCARDIOGRAM COMPLETE  Result Date: 12/17/2019    ECHOCARDIOGRAM REPORT   Patient Name:   CHESLIE WHITEN Tabone Date of Exam: 12/17/2019 Medical Rec #:  [id number]       Height:       64.0 in Accession #:    [id number]      Weight:       145.9 lb Date of Birth:  [birth year]       BSA:          53.711 m Patient Age:    89 years  BP:           115/73 mmHg Patient Gender: F               HR:           126 bpm. Exam Location:  [facility] Procedure: 2D Echo Indications:    Atrial Fibrillation 427.31 / I48.91  History:        Patient has prior history of Echocardiogram examinations, most                 recent 01/10/2014. Arrythmias:Atrial Fibrillation; Risk                 Factors:Diabetes, Non-Smoker, Hypertension and Dyslipidemia.                 Major depressive disorder, Breast Cancer.  Sonographer:    Leavy Cella RDCS (AE) Referring Phys: [place]  1. Left ventricular ejection fraction, by estimation, is 50 to 55%. The left ventricle has low normal function. The left ventricle has no regional wall motion abnormalities. There is mild concentric left ventricular hypertrophy. Left ventricular diastolic function could not be evaluated.  2. Right  ventricular systolic function is normal. The right ventricular size is normal. There is normal pulmonary artery systolic pressure. The estimated right ventricular systolic pressure is [id number] mmHg.  3. Left atrial size was mildly dilated.  4. The mitral valve is normal in structure. Trivial mitral valve regurgitation.  5. The aortic valve is normal in structure. Aortic valve regurgitation is not visualized. No aortic stenosis is present.  6. The inferior vena cava is dilated in size with <50% respiratory variability, suggesting right atrial pressure of 15 mmHg. Comparison(s): Prior images unable to be directly viewed, comparison made by report only. No significant change from prior study. FINDINGS  Left Ventricle: Left ventricular ejection fraction, by estimation, is 50 to 55%. The left ventricle has low normal function. The left ventricle has no regional wall motion abnormalities. The left ventricular internal cavity size was normal in size. There is mild concentric left ventricular hypertrophy. Left ventricular diastolic function could not be evaluated due to atrial fibrillation. Left ventricular diastolic function could not be evaluated. Right Ventricle: The right ventricular size is normal. No increase in right ventricular wall thickness. Right ventricular systolic function is normal. There is normal pulmonary artery systolic pressure. The tricuspid regurgitant velocity is 1.90 m/s, and  with an assumed right atrial pressure of 15 mmHg, the estimated right ventricular systolic pressure is [id number] mmHg. Left Atrium: Left atrial size was mildly dilated. Right Atrium: Right atrial size was normal in size. Pericardium: There is no evidence of pericardial effusion. Mitral Valve: The mitral valve is normal in structure. Trivial mitral valve regurgitation. Tricuspid Valve: The tricuspid valve is normal in structure. Tricuspid valve regurgitation is trivial. Aortic Valve: The aortic valve is normal in structure. Aortic valve  regurgitation is not visualized. No aortic stenosis is present. Pulmonic Valve: The pulmonic valve was not well visualized. Pulmonic valve regurgitation is not visualized. Aorta: The aortic root is normal in size and structure. Venous: The inferior vena cava is dilated in size with less than 50% respiratory variability, suggesting right atrial pressure of 15 mmHg. IAS/Shunts: No atrial level shunt detected by color flow Doppler.  LEFT VENTRICLE PLAX 2D LVIDd:         3.25 cm  Diastology LVIDs:         2.73 cm  LV e' lateral:   8.25 cm/s  LV PW:         1.36 cm  LV E/e' lateral: 11.7 LV IVS:        1.12 cm  LV e' medial:    7.15 cm/s LVOT diam:     1.90 cm  LV E/e' medial:  13.5 LV SV:         42 LV SV Index:   24 LVOT Area:     2.84 cm  RIGHT VENTRICLE RV S prime:     10.10 cm/s TAPSE (M-mode): 1.8 cm LEFT ATRIUM             Index LA diam:        3.80 cm 2.22 cm/m LA Vol (A2C):   37.3 ml 21.80 ml/m LA Vol (A4C):   31.0 ml 18.12 ml/m LA Biplane Vol: 34.2 ml 19.99 ml/m  AORTIC VALVE LVOT Vmax:   79.60 cm/s LVOT Vmean:  55.200 cm/s LVOT VTI:    0.147 m  AORTA Ao Root diam: 2.50 cm MITRAL VALVE               TRICUSPID VALVE MV Area (PHT): 5.23 cm    TR Peak grad:   14.4 mmHg MV Decel Time: 145 msec    TR Vmax:        190.00 cm/s MV E velocity: 96.40 cm/s MV A velocity: 36.00 cm/s  SHUNTS MV E/A ratio:  2.68        Systemic VTI:  0.15 m                            Systemic Diam: 1.90 cm Dani Gobble Croitoru MD Electronically signed by Sanda Klein MD Signature Date/Time: 12/17/2019/12:09:40 PM    Final         NO CHEST PAIN Assessment and Plan:   1. A fib with RVR still rapid despite prior IV dilt and secondary to asthma/PNA  On IV dilt then amiodarone added today, monitor QRS with zocor, haldol and amiodarone.    Echo stable  She has also rec'd Lanoxin 0.5, now on therapeutic lovenox  Today, one dose rec'd. Possible TEE DCCV tomorrow - continue loading with amiodarone may need low dose dilt po as well. 30 mg every  8.  Will defer to Anarie Kalish.   Her lungs have improved    2.  Reactive airway disease/asthma exacerbation and PNA per IM is on steroids. Improved today   3.  DM-2 per IM  4.  Dementia per IM with need for PRN haldol   5  HLD on statin zocor per PCP      For questions or updates, please contact [place] Please consult [email or web address] for contact info under     Signed, Cecilie Kicks, NP  12/18/2019 10:04 AM   Attending note Patient seen and discussed with PA Dorene Ar, I agree with her documentation above. 79 yo female history of breast cancer, DM2, HTN, dementia, admitted with SOB and wheezing. In ER hypoxic to 85%. Being treated for possible pneumonia and asthma exacerbation, started on IV abx. During admission issues with afflutter with RVR.   Echo LVEF 50-55%, no WMAs. Normal RV function.   Aflutter with RVR in setting of pneumonia and asthma exacerbation. Started on dilt gtt, amiodaorne was then added due to rate control failure. Will rebolus amio 150 and continue drip, continue IV drip after. Consider TEE/DCCV tomorrow, with her mental state would need to discuss with family prior, would make npo tonight.  WIll need to be anticoagulated, start eliquis 5mg  bid.     Carlyle Dolly MD

## 2019-12-19 DIAGNOSIS — I4892 Unspecified atrial flutter: Secondary | ICD-10-CM | POA: Diagnosis not present

## 2019-12-19 LAB — BASIC METABOLIC PANEL
Anion gap: 9 (ref 5–15)
BUN: 30 mg/dL — ABNORMAL HIGH (ref 8–23)
CO2: 24 mmol/L (ref 22–32)
Calcium: 8.5 mg/dL — ABNORMAL LOW (ref 8.9–10.3)
Chloride: 107 mmol/L (ref 98–111)
Creatinine, Ser: 0.73 mg/dL (ref 0.44–1.00)
GFR calc Af Amer: >60 mL/min (ref >60)
GFR calc non Af Amer: >60 mL/min (ref >60)
Glucose, Bld: 296 mg/dL — ABNORMAL HIGH (ref 70–99)
Potassium: 4.4 mmol/L (ref 3.5–5.1)
Sodium: 140 mmol/L (ref 135–145)

## 2019-12-19 LAB — GLUCOSE, CAPILLARY
Glucose-Capillary: 261 mg/dL — ABNORMAL HIGH (ref 70–99)
Glucose-Capillary: 278 mg/dL — ABNORMAL HIGH (ref 70–99)
Glucose-Capillary: 220 mg/dL — ABNORMAL HIGH (ref 70–99)

## 2019-12-19 LAB — MAGNESIUM: Magnesium: 2.1 mg/dL (ref 1.7–2.4)

## 2019-12-19 MED ORDER — AMIODARONE HCL 200 MG PO TABS
400.0000 mg | ORAL_TABLET | Freq: Two times a day (BID) | ORAL | Status: DC
  Administered 2019-12-19 – 2019-12-22 (×7): 400 mg via ORAL
  Filled 2019-12-19 (×7): qty 2

## 2019-12-19 MED ORDER — INSULIN GLARGINE 100 UNIT/ML ~~LOC~~ SOLN
7.0000 [IU] | Freq: Every day | SUBCUTANEOUS | Status: DC
  Administered 2019-12-19 – 2019-12-22 (×4): 7 [IU] via SUBCUTANEOUS
  Filled 2019-12-19 (×8): qty 0.07

## 2019-12-19 MED ORDER — METHYLPREDNISOLONE SODIUM SUCC 40 MG IJ SOLR
40.0000 mg | Freq: Every day | INTRAMUSCULAR | Status: DC
  Administered 2019-12-20 – 2019-12-21 (×2): 40 mg via INTRAVENOUS
  Filled 2019-12-19 (×3): qty 1

## 2019-12-19 NOTE — Progress Notes (Signed)
Progress Note  Patient Name: Amy Hoffman Date of Encounter: 12/19/2019  Primary Cardiologist: Dr Carlyle Dolly  Subjective   No complaints  Inpatient Medications    Scheduled Meds: . anastrozole  1 mg Oral Daily  . apixaban  5 mg Oral BID  . azithromycin  500 mg Oral Daily  . budesonide (PULMICORT) nebulizer solution  0.5 mg Nebulization BID  . Chlorhexidine Gluconate Cloth  6 each Topical Daily  . donepezil  5 mg Oral QHS  . insulin aspart  0-15 Units Subcutaneous TID WC  . insulin aspart  0-5 Units Subcutaneous QHS  . insulin glargine  7 Units Subcutaneous Daily  . methylPREDNISolone (SOLU-MEDROL) injection  40 mg Intravenous Daily  . risperiDONE  0.5 mg Oral BID   Continuous Infusions: . amiodarone Stopped (12/19/19 0514)  . cefTRIAXone (ROCEPHIN)  IV Stopped (12/18/19 1506)   PRN Meds: acetaminophen **OR** acetaminophen, haloperidol lactate, levalbuterol, LORazepam, ondansetron **OR** ondansetron (ZOFRAN) IV   Vital Signs    Vitals:   12/19/19 0630 12/19/19 0700 12/19/19 0731 12/19/19 0800  BP: (!) 153/56 133/65 (!) 142/66 (!) 153/65  Pulse: (!) 51 62 (!) 59 (!) 51  Resp: 16 16 17 13   Temp:   97.9 F (36.6 C)   TempSrc:   Axillary   SpO2: 99% 98% 99% 96%  Weight:      Height:        Intake/Output Summary (Last 24 hours) at 12/19/2019 0813 Last data filed at 12/19/2019 0514 Gross per 24 hour  Intake 804.15 ml  Output 800 ml  Net 4.15 ml   Last 3 Weights 12/19/2019 12/18/2019 12/18/2019  Weight (lbs) 147 lb 14.9 oz 145 lb 8.1 oz 149 lb 14.6 oz  Weight (kg) 67.1 kg 66 kg 68 kg      Telemetry    SR - Personally Reviewed  ECG    n/a - Personally Reviewed  Physical Exam   GEN: No acute distress.   Neck: No JVD Cardiac: RRR, no murmurs, rubs, or gallops.  Respiratory: Clear to auscultation bilaterally. GI: Soft, nontender, non-distended  MS: No edema; No deformity. Neuro:  Nonfocal  Psych: Normal affect   Labs    High Sensitivity Troponin:     Recent Labs  Lab 12/16/19 1238 12/16/19 1921  TROPONINIHS 15 29*      Chemistry Recent Labs  Lab 12/16/19 1238 12/17/19 0438 12/19/19 0357  NA 139 139 140  K 3.4* 3.9 4.4  CL 101 107 107  CO2 27 23 24   GLUCOSE 225* 313* 296*  BUN 18 28* 30*  CREATININE 0.70 0.86 0.73  CALCIUM 9.0 8.2* 8.5*  PROT 7.3  --   --   ALBUMIN 3.5  --   --   AST 17  --   --   ALT 17  --   --   ALKPHOS 87  --   --   BILITOT 1.0  --   --   GFRNONAA >60 >60 >60  GFRAA >60 >60 >60  ANIONGAP 11 9 9      Hematology Recent Labs  Lab 12/16/19 1238 12/17/19 0438  WBC 13.4* 10.1  RBC 4.24 3.74*  HGB 12.6 10.8*  HCT 39.1 34.4*  MCV 92.2 92.0  MCH 29.7 28.9  MCHC 32.2 31.4  RDW 13.0 12.9  PLT 219 204    BNP Recent Labs  Lab 12/16/19 1238  BNP 196.0*     DDimer No results for input(s): DDIMER in the last 168 hours.  Radiology    ECHOCARDIOGRAM COMPLETE  Result Date: 12/17/2019    ECHOCARDIOGRAM REPORT   Patient Name:   Amy Hoffman Date of Exam: 12/17/2019 Medical Rec #:  [id number]       Height:       64.0 in Accession #:    [id number]      Weight:       145.9 lb Date of Birth:  27-Mar-1941       BSA:          24.711 m Patient Age:    79 years        BP:           115/73 mmHg Patient Gender: F               HR:           126 bpm. Exam Location:  [facility] Procedure: 2D Echo Indications:    Atrial Fibrillation 427.31 / I48.91  History:        Patient has prior history of Echocardiogram examinations, most                 recent 01/10/2014. Arrythmias:Atrial Fibrillation; Risk                 Factors:Diabetes, Non-Smoker, Hypertension and Dyslipidemia.                 Major depressive disorder, Breast Cancer.  Sonographer:    Leavy Cella RDCS (AE) Referring Phys: [place]  1. Left ventricular ejection fraction, by estimation, is 50 to 55%. The left ventricle has low normal function. The left ventricle has no regional wall motion abnormalities. There is mild  concentric left ventricular hypertrophy. Left ventricular diastolic function could not be evaluated.  2. Right ventricular systolic function is normal. The right ventricular size is normal. There is normal pulmonary artery systolic pressure. The estimated right ventricular systolic pressure is [id number] mmHg.  3. Left atrial size was mildly dilated.  4. The mitral valve is normal in structure. Trivial mitral valve regurgitation.  5. The aortic valve is normal in structure. Aortic valve regurgitation is not visualized. No aortic stenosis is present.  6. The inferior vena cava is dilated in size with <50% respiratory variability, suggesting right atrial pressure of 15 mmHg. Comparison(s): Prior images unable to be directly viewed, comparison made by report only. No significant change from prior study. FINDINGS  Left Ventricle: Left ventricular ejection fraction, by estimation, is 50 to 55%. The left ventricle has low normal function. The left ventricle has no regional wall motion abnormalities. The left ventricular internal cavity size was normal in size. There is mild concentric left ventricular hypertrophy. Left ventricular diastolic function could not be evaluated due to atrial fibrillation. Left ventricular diastolic function could not be evaluated. Right Ventricle: The right ventricular size is normal. No increase in right ventricular wall thickness. Right ventricular systolic function is normal. There is normal pulmonary artery systolic pressure. The tricuspid regurgitant velocity is 1.90 m/s, and  with an assumed right atrial pressure of 15 mmHg, the estimated right ventricular systolic pressure is [id number] mmHg. Left Atrium: Left atrial size was mildly dilated. Right Atrium: Right atrial size was normal in size. Pericardium: There is no evidence of pericardial effusion. Mitral Valve: The mitral valve is normal in structure. Trivial mitral valve regurgitation. Tricuspid Valve: The tricuspid valve is normal in structure.  Tricuspid valve regurgitation is trivial. Aortic Valve: The aortic valve is normal in structure.  Aortic valve regurgitation is not visualized. No aortic stenosis is present. Pulmonic Valve: The pulmonic valve was not well visualized. Pulmonic valve regurgitation is not visualized. Aorta: The aortic root is normal in size and structure. Venous: The inferior vena cava is dilated in size with less than 50% respiratory variability, suggesting right atrial pressure of 15 mmHg. IAS/Shunts: No atrial level shunt detected by color flow Doppler.  LEFT VENTRICLE PLAX 2D LVIDd:         3.25 cm  Diastology LVIDs:         2.73 cm  LV e' lateral:   8.25 cm/s LV PW:         1.36 cm  LV E/e' lateral: 11.7 LV IVS:        1.12 cm  LV e' medial:    7.15 cm/s LVOT diam:     1.90 cm  LV E/e' medial:  13.5 LV SV:         42 LV SV Index:   24 LVOT Area:     2.84 cm  RIGHT VENTRICLE RV S prime:     10.10 cm/s TAPSE (M-mode): 1.8 cm LEFT ATRIUM             Index LA diam:        3.80 cm 2.22 cm/m LA Vol (A2C):   37.3 ml 21.80 ml/m LA Vol (A4C):   31.0 ml 18.12 ml/m LA Biplane Vol: 34.2 ml 19.99 ml/m  AORTIC VALVE LVOT Vmax:   79.60 cm/s LVOT Vmean:  55.200 cm/s LVOT VTI:    0.147 m  AORTA Ao Root diam: 2.50 cm MITRAL VALVE               TRICUSPID VALVE MV Area (PHT): 5.23 cm    TR Peak grad:   14.4 mmHg MV Decel Time: 145 msec    TR Vmax:        190.00 cm/s MV E velocity: 96.40 cm/s MV A velocity: 36.00 cm/s  SHUNTS MV E/A ratio:  2.68        Systemic VTI:  0.15 m                            Systemic Diam: 1.90 cm Sanda Klein MD Electronically signed by Sanda Klein MD Signature Date/Time: 12/17/2019/12:09:40 PM    Final     Cardiac Studies     Patient Profile     As per H&P written by Dr. Denton Brick on 12/16/2019 79 y.o.femalewith medical history significant fordiabetes mellitus, hypertension, depression, dementia, breast cancer. Patient presented to the ED with complaints of difficulty breathing and wheezing over the past  2 days. Daughter present at bedside helps with most of the history.She reports patient has some mild diarrhea previously. No fevers, no chills, no vomiting. No lower extremity swelling. No chest pain. Denies dysuria. No dizziness. She is unaware of a fast heart rate.  Assessment & Plan    1. Aflutter with RVR - failed rate control with dilt gtt, started on amio gtt - started on eliquis for stroke prevention.  - 12/2019 echo LVEF 50-55%  - converted this AM to SR. Amio gtt off. Will start amio 400mg  bid x 7 days, then 200mg  bid x 2 weeks, then 200mg  daily. I would look to get her off amio in long run, this episode may have been primarily driven by her pneumonia and asthma exacerbation. May start an additional av nodal agent pending heart rates  2. Asthma exacerbation/pneumonia - per primary team   We will follow telemetry only tomorrow   For questions or updates, please contact [place] Please consult [email or web address] for contact info under        Signed, Carlyle Dolly, MD  12/19/2019, 8:13 AM

## 2019-12-19 NOTE — Progress Notes (Signed)
At [place] Patient converted to NSR and into SB. Amiodarone paused. MD [place] notified awaiting further orders

## 2019-12-19 NOTE — Progress Notes (Signed)
Palliative-   Consult received and chart reviewed. Evaluated patient at bedside. She is unable to participate in [place] discussion.  Called her [place]. Plan made to meet tomorrow at 1pm for [place] discussion.   Mariana Kaufman, AGNP-C Palliative Medicine  Please call Palliative Medicine team phone with any questions [phone]. For individual providers please see AMION.  No charge

## 2019-12-19 NOTE — Progress Notes (Signed)
Inpatient Diabetes Program Recommendations  AACE/ADA: New Consensus Statement on Inpatient Glycemic Control   Target Ranges:  Prepandial:   less than 140 mg/dL      Peak postprandial:   less than 180 mg/dL (1-2 hours)      Critically ill patients:  140 - 180 mg/dL   Results for Slider, RAELANI NIWA (MRN [id number]) as of 12/19/2019 07:46  Ref. Range 12/18/2019 07:13 12/18/2019 11:30 12/18/2019 16:53 12/18/2019 20:41 12/19/2019 07:30  Glucose-Capillary Latest Ref Range: 70 - 99 mg/dL 272 (H)  Novolog 8 units 252 (H)  Novolog 8 units 283 (H)  Novolog 8 units 196 (H) 261 (H)  Novolog 8 units   Review of Glycemic Control  Outpatient Diabetes medications: Amaryl 8 mg daily Current orders for Inpatient glycemic control: Novolog 0-15 units TID with meals, Novolog 0-5 units QHS; Solumedrol 40 mg Q12H  Inpatient Diabetes Program Recommendations:   Insulin:  If steroids are continued, please consider ordering Lantus 7 units Q24H (based on 66 kg x 0.1 units) and Novolog 3 units TID with meals for meal coverage if patient eats at least 50% of meals.  Thanks, Barnie Alderman, RN, MSN, CDE Diabetes Coordinator Inpatient Diabetes Program [phone] (Team Pager from 8am to 5pm)

## 2019-12-19 NOTE — Progress Notes (Addendum)
PROGRESS NOTE    Amy Hoffman  [id number] DOB: [birth year] DOA: 12/16/2019 PCP: [name], NP     Brief Narrative:  As per H&P written by Dr. Denton Brick on 12/16/2019 79 y.o. female with medical history significant for diabetes mellitus, hypertension, depression, dementia, breast cancer. Patient presented to the ED with complaints of difficulty breathing and wheezing over the past 2 days.  Daughter present at bedside helps with most of the history.  She reports patient has some mild diarrhea previously. No fevers, no chills, no vomiting.  No lower extremity swelling.  No chest pain.  Denies dysuria.  No dizziness.  She is unaware of a fast heart rate.  Patient was in the ED yesterday with complaints of hyperglycemia but blood sugars were 215, also with increased sleepiness, and diarrhea.  But patient left AMA, with her caregiver, per notes because "it was taking too long".  ED Course: O2 sats with ambulation down to 85%, with increasing respiratory distress.  Heart rate initially mostly 80s to 109.  But on my evaluation patient's heart rate jumped up to the 180s.  Patient had just completed an hour-long nebulizer treatment.  WBC 13.4.  Chest x-ray suggests multifocal pneumonia.  IV ceftriaxone given.  Hospitalist to admit for asthma and pneumonia.   Assessment & Plan: 1-reactive airway disease/asthma exacerbation: In the setting of community-acquired multifocal pneumonia. -Continue current antibiotics -Continue to follow culture results -Continue supportive care -Continue nebulizer management and start steroids tapering (now down to once a day dose of solumedrol). Hopefully transition to oral management in the next 24 hours.  -follow clinical response.  2-A. Fib/atrial flutter with RVR -Heart rate remained irregular and difficult to control; patient failed management with the use of Cardizem drip (max), metoprolol and digoxin. -Converted to SR and is rate controlled after using  amiodarone drip. Transitioning to oral regimen as per cardiology rec's.  -Continue as needed beta-blocker. -CHADsVASC score 2-3 -started on eliquis. 3-type 2 diabetes mellitus -With hyperglycemia in the setting of steroids usage -A1c 6.8 -Continue sliding scale insulin and added lantus.  4-dementia with behavioral disturbances -Continue Aricept -Continue Risperdal -Continue supportive care and constant reorientation  -Continue as needed Tylenol.  5-hyperlipidemia -Continue Zocor at time of discharge -Currently on hold to minimize the chances of QT prolongation while using amiodarone and as needed Haldol..  6-hx of breast cancer -continue Arimidex.  7-dysphagia -Continue speech therapy evaluation -Currently with regular consistency, but requiring nectar thick liquid. -MBS pending.  8-hypokalemia -most likely in the setting of albuterol usage. -repleted and WNL currently -continue to monitor and given A. Flutter will try to maintain at 4 as much as possible.  DVT prophylaxis: eliquis Code Status: Full code Family Communication: No family at bedside.  Husband updated over the phone. Disposition Plan: Continue nebulizer management, continue steroids, continue antibiotics and oral amiodarone as recommended by cardiology service.  Patient will be transferred to telemetry bed.  Follow final recommendations by speech therapy and transition antibiotics to oral regimen in the next 24 hours.  Anticipate discharge home with home health services in the next 24 to 48 hours.  Consultants:   Cardiology  Procedures:   See below for x-ray report  2D echo:  1. Left ventricular ejection fraction, by estimation, is 50 to 55%. The  left ventricle has low normal function. The left ventricle has no regional  wall motion abnormalities. There is mild concentric left ventricular  hypertrophy. Left ventricular  diastolic function could not be evaluated.  2.  Right ventricular systolic  function is normal. The right ventricular  size is normal. There is normal pulmonary artery systolic pressure. The  estimated right ventricular systolic pressure is [id number] mmHg.  3. Left atrial size was mildly dilated.  4. The mitral valve is normal in structure. Trivial mitral valve  regurgitation.  5. The aortic valve is normal in structure. Aortic valve regurgitation is  not visualized. No aortic stenosis is present.  6. The inferior vena cava is dilated in size with <50% respiratory  variability, suggesting right atrial pressure of 15 mmHg.   Antimicrobials:  Anti-infectives (From admission, onward)   Start     Dose/Rate Route Frequency Ordered Stop   12/17/19 1500  azithromycin (ZITHROMAX) tablet 500 mg     500 mg Oral Daily 12/16/19 1819     12/17/19 1500  cefTRIAXone (ROCEPHIN) 1 g in sodium chloride 0.9 % 100 mL IVPB     1 g 200 mL/hr over 30 Minutes Intravenous Every 24 hours 12/16/19 1819     12/16/19 1530  cefTRIAXone (ROCEPHIN) 1 g in sodium chloride 0.9 % 100 mL IVPB     1 g 200 mL/hr over 30 Minutes Intravenous  Once 12/16/19 1520 12/16/19 1618   12/16/19 1530  azithromycin (ZITHROMAX) tablet 500 mg     500 mg Oral  Once 12/16/19 1520 12/16/19 1538       Subjective: Continues to demonstrate overall improvement in her breathing; no fever, no chest pain, no nausea, no vomiting.  Heart rate finally converted and controlled after amiodarone drip.  Some difficulty swallowing and with ongoing speech evaluation therapy.  Objective: Vitals:   12/19/19 1300 12/19/19 1545 12/19/19 1600 12/19/19 1603  BP: (!) 187/83  (!) 182/76 (!) 183/85  Pulse: 88 79 86 86  Resp: 19 18 20 15   Temp:      TempSrc:      SpO2: 95% 98% 99% 98%  Weight:      Height:        Intake/Output Summary (Last 24 hours) at 12/19/2019 1618 Last data filed at 12/19/2019 1503 Gross per 24 hour  Intake 737.71 ml  Output 600 ml  Net 137.71 ml   Filed Weights   12/18/19 0300 12/18/19 0742 12/19/19  0515  Weight: 68 kg 66 kg 67.1 kg    Examination: General exam: Alert, awake, oriented x 1; no chest pain, no palpitations.  Reports no nausea or vomiting.  Has remained intermittently agitated, pulling on IVs and telemetry cables.  Using 2 L nasal cannula supplementation currently. Respiratory system: Scattered rhonchi appreciated diffusely. Mild expiratory wheezing; no using accessory muscles. Cardiovascular system: Rate control and sinus rhythm currently; no rubs, no gallops, no JVD on exam. Gastrointestinal system: Abdomen is nondistended, soft and nontender. No organomegaly or masses felt. Normal bowel sounds heard. Central nervous system: Alert and oriented as mentioned above.. No focal neurological deficits. Extremities: No cyanosis or clubbing. Skin: No rashes, no petechiae. Psychiatry: Judgement and insight impaired secondary to underlying dementia; mood and affect appropriately at this moment.    Data Reviewed: I have personally reviewed following labs and imaging studies  CBC: Recent Labs  Lab 12/16/19 1238 12/17/19 0438  WBC 13.4* 10.1  NEUTROABS 11.3*  --   HGB 12.6 10.8*  HCT 39.1 34.4*  MCV 92.2 92.0  PLT 219 0000000   Basic Metabolic Panel: Recent Labs  Lab 12/16/19 1238 12/17/19 0438 12/19/19 0357  NA 139 139 140  K 3.4* 3.9 4.4  CL 101 107 107  CO2 27 23 24   GLUCOSE 225* 313* 296*  BUN 18 28* 30*  CREATININE 0.70 0.86 0.73  CALCIUM 9.0 8.2* 8.5*  MG 1.8  --  2.1   GFR: Estimated Creatinine Clearance: 53.7 mL/min (by C-G formula based on SCr of 0.73 mg/dL).   Liver Function Tests: Recent Labs  Lab 12/16/19 1238  AST 17  ALT 17  ALKPHOS 87  BILITOT 1.0  PROT 7.3  ALBUMIN 3.5   HbA1C: Recent Labs    12/16/19 1921  HGBA1C 6.8*   CBG: Recent Labs  Lab 12/18/19 1130 12/18/19 1653 12/18/19 2041 12/19/19 0730 12/19/19 1120  GLUCAP 252* 283* 196* 261* 278*   Urine analysis:    Component Value Date/Time   COLORURINE YELLOW 12/17/2019  0602   APPEARANCEUR CLEAR 12/17/2019 0602   LABSPEC 1.021 12/17/2019 0602   PHURINE 5.0 12/17/2019 0602   GLUCOSEU >=500 (A) 12/17/2019 0602   HGBUR NEGATIVE 12/17/2019 0602   BILIRUBINUR NEGATIVE 12/17/2019 0602   KETONESUR 5 (A) 12/17/2019 0602   PROTEINUR 100 (A) 12/17/2019 0602   UROBILINOGEN 0.2 01/23/2013 2317   NITRITE NEGATIVE 12/17/2019 0602   LEUKOCYTESUR NEGATIVE 12/17/2019 0602    Recent Results (from the past 240 hour(s))  Respiratory Panel by RT PCR (Flu A&B, Covid) - Nasopharyngeal Swab     Status: None   Collection Time: 12/16/19 12:56 PM   Specimen: Nasopharyngeal Swab  Result Value Ref Range Status   SARS Coronavirus 2 by RT PCR NEGATIVE NEGATIVE Final    Comment: (NOTE) SARS-CoV-2 target nucleic acids are NOT DETECTED. The SARS-CoV-2 RNA is generally detectable in upper respiratoy specimens during the acute phase of infection. The lowest concentration of SARS-CoV-2 viral copies this assay can detect is 131 copies/mL. A negative result does not preclude SARS-Cov-2 infection and should not be used as the sole basis for treatment or other patient management decisions. A negative result may occur with  improper specimen collection/handling, submission of specimen other than nasopharyngeal swab, presence of viral mutation(s) within the areas targeted by this assay, and inadequate number of viral copies (<131 copies/mL). A negative result must be combined with clinical observations, patient history, and epidemiological information. The expected result is Negative. Fact Sheet for Patients:  PinkCheek.be Fact Sheet for Healthcare Providers:  GravelBags.it This test is not yet ap proved or cleared by the [place] FDA and  has been authorized for detection and/or diagnosis of SARS-CoV-2 by FDA under an Emergency Use Authorization (EUA). This EUA will remain  in effect (meaning this test can be used) for  the duration of the COVID-19 declaration under Section 564(b)(1) of the Act, 21 U.S.C. section 360bbb-3(b)(1), unless the authorization is terminated or revoked sooner.    Influenza A by PCR NEGATIVE NEGATIVE Final   Influenza B by PCR NEGATIVE NEGATIVE Final    Comment: (NOTE) The Xpert Xpress SARS-CoV-2/FLU/RSV assay is intended as an aid in  the diagnosis of influenza from Nasopharyngeal swab specimens and  should not be used as a sole basis for treatment. Nasal washings and  aspirates are unacceptable for Xpert Xpress SARS-CoV-2/FLU/RSV  testing. Fact Sheet for Patients: PinkCheek.be Fact Sheet for Healthcare Providers: GravelBags.it This test is not yet approved or cleared by the [place] FDA and  has been authorized for detection and/or diagnosis of SARS-CoV-2 by  FDA under an Emergency Use Authorization (EUA). This EUA will remain  in effect (meaning this test can be used) for the duration of the  Covid-19 declaration under Section 564(b)(1)  of the Act, 21  U.S.C. section 360bbb-3(b)(1), unless the authorization is  terminated or revoked. Performed at [facility], [address], [place], [place] [address]   MRSA PCR Screening     Status: None   Collection Time: 12/16/19  5:38 PM   Specimen: Nasal Mucosa; Nasopharyngeal  Result Value Ref Range Status   MRSA by PCR NEGATIVE NEGATIVE Final    Comment:        The GeneXpert MRSA Assay (FDA approved for NASAL specimens only), is one component of a comprehensive MRSA colonization surveillance program. It is not intended to diagnose MRSA infection nor to guide or monitor treatment for MRSA infections. Performed at [facility], [address] [place], [place] [address]      Radiology Studies: No results found.  Scheduled Meds: . amiodarone  400 mg Oral BID  . anastrozole  1 mg Oral Daily  . apixaban  5 mg Oral BID  . azithromycin  500 mg Oral Daily  .  budesonide (PULMICORT) nebulizer solution  0.5 mg Nebulization BID  . Chlorhexidine Gluconate Cloth  6 each Topical Daily  . donepezil  5 mg Oral QHS  . insulin aspart  0-15 Units Subcutaneous TID WC  . insulin aspart  0-5 Units Subcutaneous QHS  . insulin glargine  7 Units Subcutaneous Daily  . methylPREDNISolone (SOLU-MEDROL) injection  40 mg Intravenous Daily  . risperiDONE  0.5 mg Oral BID   Continuous Infusions: . cefTRIAXone (ROCEPHIN)  IV 1 g (12/19/19 1503)     LOS: 3 days    Time spent: 35 minutes.   Barton Dubois, MD [facility] Pager [phone]   12/19/2019, 4:18 PM

## 2019-12-19 NOTE — Progress Notes (Signed)
Amy Meigs, MD, states to stop Amiodarone gtt and leave in order for medication just in case she returns to Af[facility] RVR. Continue to monitor.

## 2019-12-19 NOTE — Progress Notes (Signed)
Per pharmacy Arimidex is not in stock and will have to be delivered from [facility], thus delay in administration.

## 2019-12-20 DIAGNOSIS — J9601 Acute respiratory failure with hypoxia: Secondary | ICD-10-CM

## 2019-12-20 DIAGNOSIS — Z7189 Other specified counseling: Secondary | ICD-10-CM

## 2019-12-20 DIAGNOSIS — J189 Pneumonia, unspecified organism: Principal | ICD-10-CM

## 2019-12-20 DIAGNOSIS — J4521 Mild intermittent asthma with (acute) exacerbation: Secondary | ICD-10-CM

## 2019-12-20 DIAGNOSIS — F03918 Unspecified dementia, unspecified severity, with other behavioral disturbance: Secondary | ICD-10-CM

## 2019-12-20 DIAGNOSIS — F0391 Unspecified dementia with behavioral disturbance: Secondary | ICD-10-CM

## 2019-12-20 DIAGNOSIS — Z515 Encounter for palliative care: Secondary | ICD-10-CM

## 2019-12-20 DIAGNOSIS — Z0000 Encounter for general adult medical examination without abnormal findings: Secondary | ICD-10-CM

## 2019-12-20 LAB — CBC
HCT: 36.4 % (ref 36.0–46.0)
Hemoglobin: 11.7 g/dL — ABNORMAL LOW (ref 12.0–15.0)
MCH: 29.4 pg (ref 26.0–34.0)
MCHC: 32.1 g/dL (ref 30.0–36.0)
MCV: 91.5 fL (ref 80.0–100.0)
Platelets: 265 10*3/uL (ref 150–400)
RBC: 3.98 MIL/uL (ref 3.87–5.11)
RDW: 12.9 % (ref 11.5–15.5)
WBC: 11.3 10*3/uL — ABNORMAL HIGH (ref 4.0–10.5)
nRBC: 0 % (ref 0.0–0.2)

## 2019-12-20 LAB — GLUCOSE, CAPILLARY
Glucose-Capillary: 117 mg/dL — ABNORMAL HIGH (ref 70–99)
Glucose-Capillary: 138 mg/dL — ABNORMAL HIGH (ref 70–99)
Glucose-Capillary: 215 mg/dL — ABNORMAL HIGH (ref 70–99)
Glucose-Capillary: 280 mg/dL — ABNORMAL HIGH (ref 70–99)
Glucose-Capillary: 293 mg/dL — ABNORMAL HIGH (ref 70–99)

## 2019-12-20 MED ORDER — HYDRALAZINE HCL 25 MG PO TABS
25.0000 mg | ORAL_TABLET | Freq: Four times a day (QID) | ORAL | Status: DC | PRN
  Administered 2019-12-20: 25 mg via ORAL
  Filled 2019-12-20: qty 1

## 2019-12-20 NOTE — Consult Note (Signed)
Consultation Note Date: 12/20/2019   Patient Name: Amy Hoffman  DOB: 12/19/1940  MRN: [id number]  Age / Sex: 79 y.o., female  PCP: [name], NP Referring Physician: Rodena Goldmann, DO  Reason for Consultation: Establishing goals of care  HPI/Patient Profile: 79 y.o. female  with past medical history of HTN, DM, depression, dementia, remote history of breast cancer admitted on 12/16/2019 with hyperglycemia, increasing weakness, and acute respiratory failure. Workup revealed multifocal asthma. Admission has been complicated due to developing a fib with RVR after nebulizer- failed cardizem drip but converted with amiodorone- now on oral amio. Palliative medicine consulted for goals of care.    Clinical Assessment and Goals of Care:  I have reviewed medical records including EPIC notes, labs and imaging, examined the patient and met at bedside with Amy Hoffman, her spouse- and their daughter- Amy Hoffman (who is also patient's designated HCPOA)  to discuss diagnosis prognosis, GOC, EOL wishes, disposition and options.  I introduced Palliative Medicine as specialized medical care for people living with serious illness. It focuses on providing relief from the symptoms and stress of a serious illness.   We discussed a brief life review of the patient. She has two children, lives at home with spouse. Enjoys cooking and eating good food.   Ms. Bittinger is present and can participate minimally in our goals of care discussion. She is able to speak in complete sentences, feeds herself, she cannot tell me the name of her son, she cannot tell me why she is in the hospital.  As far as functional and nutritional status prior to admission she was ambulatory, required minimal assistance with dressing, some decrease in oral intake due to dysphagia- has been seen by SLP in the past with recommendations for thickened liquids. She  requires assistance with ADL's. She is incontinent at times when she takes her metformin.  We discussed her current illness and what it means in the larger context of her on-going co-morbidities.  Natural disease trajectory and expectations at EOL were discussed. We discussed the illness trajectory of dementia and how it progresses to EOL- also discussed how each insult to Ms. Reeves's body worsens her dementia.   I attempted to elicit values and goals of care important to the patient.  Ms Shere values being able to interact with her family.   The difference between aggressive medical intervention and comfort care was considered in light of the patient's goals of care.   Advanced directives, concepts specific to code status, artifical feeding and hydration, and rehospitalization were considered and discussed.  MOST form was completed via VYNCA with following selections-   1. DNR  2. Limited Medical interventions  3. Antibiotics if indicated  4. IV fluids if indicated  5. Limited trial of feeding tube- if there is reversible process  We discussed that these selections may change over time as patient's illness progresses.  We discussed short term goals- patient and family are hopeful for discharge to SNF for short term rehab. Given her current  level of functioning- I agree this is reasonable- we did discuss that as patient's dementia progresses- there will come a time when rehab will not help her improve and would not be indicated.  We discussed long term anticipatory care planning- family is aware that patient may not be able to reside safely at home in the future as her dementia progresses. I encouraged them to begin now to prepare for how 24 hour care would be provided for patient. They believe that patient has a supplemental insurance plan- encouraged them to begin these preparations.   Mr. Tseng and [name] also had questions regarding her diabetes management. Mr. Hlavaty had been checking patient's  blood sugar at home and giving her metformin only when he felt that she needed it- this was not directed by her physician. We reviewed appropriate use of metformin and the fact that it should be taken every day at the same time. Also discussed that intermittent metformin will cause GI distress- however, consistent, regular use will allow her body to adjust to it and the diarrhea will resolve.   Ms. Hendricks currently does not have a primary care provider- however, she has an appointment to establish care with a new provider later this month.    Hospice and Palliative Care services outpatient were explained and offered. All agree to continued followup from outpatient Palliative services.   Questions and concerns were addressed.  The family was encouraged to call with questions or concerns.   Primary Decision Maker HCPOA    SUMMARY OF RECOMMENDATIONS -MOST completed- DNR, limited interventions, IV Abx if indicated, IV fluids if indicated, limited trial feeding tube -No invasive procedures, no surgeries that would not improve quality of life -Continue risperidone for dementia related behavioral disturbance- this appears to be improving -Encouraged to establish with PCP when patient is discharged -Family desires short term rehab at SNF- then d/c home- agree to continued f/u by outpatient Palliative   Code Status/Advance Care Planning:  DNR  Prognosis:    Unable to determine  Discharge Planning: [place] for rehab with Palliative care service follow-up  Primary Diagnoses: Present on Admission:  Asthma exacerbation  Atrial fibrillation with rapid ventricular response ([place])  Type 2 diabetes mellitus with hyperglycemia ([place])  Major depressive disorder, single episode, unspecified  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 The following aspects are  pertinent.  Past Medical History:  Diagnosis Date   Abnormal uterine and vaginal bleeding, unspecified    Allergic rhinitis due to pollen    Anxiety    Asthma    Cancer (HCC)    breast cancer - right   Dementia ([place])    Depression    Depression    Diabetes mellitus without complication ([place])    Essential (primary) hypertension    Hyperlipidemia, unspecified    Hypertension    Major depressive disorder, single episode, unspecified    Obesity, unspecified    Pain in joint involving shoulder region    Reflux esophagitis    Trigger finger, acquired    Type 2 diabetes mellitus with hyperglycemia ([place])    Unspecified asthma with (acute) exacerbation    Unspecified asthma, uncomplicated    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 (HCC)    Unspecified osteoarthritis, unspecified site    Social History   Socioeconomic History   Marital status: Married    Spouse name: Not on file   Number of children:  Not on file   Years of education: Not on file   Highest education level: Not on file  Occupational History   Not on file  Tobacco Use   Smoking status: Never Smoker   Smokeless tobacco: Never Used  Substance and Sexual Activity   Alcohol use: No   Drug use: No   Sexual activity: Not on file  Other Topics Concern   Not on file  Social History Narrative   Not on file   Social Determinants of Health   Financial Resource Strain:    Difficulty of Paying Living Expenses:   Food Insecurity:    Worried About [place] in the Last Year:    Arboriculturist in the Last Year:   Transportation Needs:    Film/video editor (Medical):    Lack of Transportation (Non-Medical):   Physical Activity:    Days of Exercise per Week:    Minutes of Exercise per Session:   Stress:    Feeling of Stress :   Social Connections:    Frequency of Communication with Friends and Family:    Frequency of Social Gatherings with  Friends and Family:    Attends Religious Services:    Active Member of Clubs or Organizations:    Attends Music therapist:    Marital Status:    Family History  Family history unknown: Yes   Scheduled Meds:  amiodarone  400 mg Oral BID   anastrozole  1 mg Oral Daily   apixaban  5 mg Oral BID   azithromycin  500 mg Oral Daily   budesonide (PULMICORT) nebulizer solution  0.5 mg Nebulization BID   Chlorhexidine Gluconate Cloth  6 each Topical Daily   donepezil  5 mg Oral QHS   insulin aspart  0-15 Units Subcutaneous TID WC   insulin aspart  0-5 Units Subcutaneous QHS   insulin glargine  7 Units Subcutaneous Daily   methylPREDNISolone (SOLU-MEDROL) injection  40 mg Intravenous Daily   risperiDONE  0.5 mg Oral BID   Continuous Infusions:  cefTRIAXone (ROCEPHIN)  IV 1 g (12/19/19 1503)   PRN Meds:.acetaminophen **OR** acetaminophen, haloperidol lactate, hydrALAZINE, levalbuterol, LORazepam, ondansetron **OR** ondansetron (ZOFRAN) IV Medications Prior to Admission:  Prior to Admission medications   Medication Sig Start Date End Date Taking? Authorizing Provider  amLODipine (NORVASC) 5 MG tablet Take 5 mg by mouth daily. 12/26/18  Yes [provider]  anastrozole (ARIMIDEX) 1 MG tablet Take 1 mg by mouth daily.   Yes [provider]  donepezil (ARICEPT) 5 MG tablet Take 5 mg by mouth at bedtime.   Yes [provider]  glimepiride (AMARYL) 4 MG tablet Take 8 mg by mouth every morning.    Yes [provider]  risperiDONE (RISPERDAL) 0.5 MG tablet Take 0.5 mg by mouth 2 (two) times daily.   Yes [provider]  simvastatin (ZOCOR) 40 MG tablet Take 40 mg by mouth every evening.   Yes [provider]   Allergies  Allergen Reactions   Codeine    Zantac [Ranitidine Hcl]    Review of Systems  Constitutional: Positive for activity change. Negative for appetite change.  Psychiatric/Behavioral: Negative  for agitation.    Physical Exam Vitals and nursing note reviewed.  Constitutional:      Appearance: Normal appearance.  HENT:     Head: Normocephalic and atraumatic.  Cardiovascular:     Rate and Rhythm: Normal rate and regular rhythm.  Pulmonary:  Effort: Pulmonary effort is normal.  Neurological:     Mental Status: She is alert. She is disoriented.  Psychiatric:        Mood and Affect: Mood normal.        Behavior: Behavior normal.     Vital Signs: BP (!) 149/78 (BP Location: Right Arm)    Pulse 77    Temp 97.6 F (36.4 C) (Oral)    Resp 19    Ht '5\' 4"'$  (1.626 m)    Wt 67.1 kg    SpO2 96%    BMI 25.39 kg/m  Pain Scale: PAINAD   Pain Score: 0-No pain   SpO2: SpO2: 96 % O2 Device:SpO2: 96 % O2 Flow Rate: .O2 Flow Rate (L/min): 1 L/min  IO: Intake/output summary:   Intake/Output Summary (Last 24 hours) at 12/20/2019 1358 Last data filed at 12/20/2019 0945 Gross per 24 hour  Intake 590 ml  Output 2975 ml  Net -2385 ml    LBM: Last BM Date: 12/19/19 Baseline Weight: Weight: 58.9 kg Most recent weight: Weight: 67.1 kg     Palliative Assessment/Data: PPS: 50%     Thank you for this consult. Palliative medicine will continue to follow and assist as needed.   Time In: 1300 Time Out: 1430 Time Total: 90 minutes Prolonged services billed- yes Greater than 50%  of this time was spent counseling and coordinating care related to the above assessment and plan.  Signed by: Mariana Kaufman, AGNP-C Palliative Medicine    Please contact Palliative Medicine Team phone at [phone] for questions and concerns.  For individual provider: See Shea Evans

## 2019-12-20 NOTE — Plan of Care (Signed)
  Problem: Acute Rehab PT Goals(only PT should resolve) Goal: Pt Will Go Supine/Side To Sit Outcome: Progressing Flowsheets (Taken 12/20/2019 1407) Pt will go Supine/Side to Sit: with min guard assist Goal: Patient Will Transfer Sit To/From Stand Outcome: Progressing Flowsheets (Taken 12/20/2019 1407) Patient will transfer sit to/from stand:  with minimal assist  with moderate assist Goal: Pt Will Transfer Bed To Chair/Chair To Bed Outcome: Progressing Flowsheets (Taken 12/20/2019 1407) Pt will Transfer Bed to Chair/Chair to Bed:  with min assist  with mod assist Goal: Pt Will Ambulate Outcome: Progressing Flowsheets (Taken 12/20/2019 1407) Pt will Ambulate:  15 feet  with moderate assist  with rolling walker   2:07 PM, 12/20/19 Lonell Grandchild, MPT Physical Therapist with [facility] [phone] office [phone] mobile phone

## 2019-12-20 NOTE — ACP (Advance Care Planning) (Signed)
Advanced Care Planning Note  Advanced directives, concepts specific to code status, artifical feeding and hydration, and rehospitalization were considered and discussed.  MOST form was completed via VYNCA with following selections-              1. DNR             2. Limited Medical interventions             3. Antibiotics if indicated             4. IV fluids if indicated             5. Limited trial of feeding tube- if there is reversible process  We discussed that these selections may change over time as patient's illness progresses.  We discussed short term goals- patient and family are hopeful for discharge to SNF for short term rehab. Given her current level of functioning- I agree this is reasonable- we did discuss that as patient's dementia progresses- there will come a time when rehab will not help her improve and would not be indicated.  We discussed long term anticipatory care planning- family is aware that patient may not be able to reside safely at home in the future as her dementia progresses. I encouraged them to begin now to prepare for how 24 hour care would be provided for patient. They believe that patient has a supplemental insurance plan- encouraged them to begin these preparations.   Mariana Kaufman, AGNP-C Palliative Medicine  Please call Palliative Medicine team phone with any questions [phone]. For individual providers please see [facility]ION.  Total time: 90 minutes

## 2019-12-20 NOTE — Progress Notes (Addendum)
PROGRESS NOTE    Deshea Doubleday Schwartz  FS:[id number] DOB: 08-29-1941 DOA: 12/16/2019 PCP: [name], NP   Brief Narrative:  As per H&P written by Dr. Denton Brick on 12/16/2019 79 y.o.femalewith medical history significant fordiabetes mellitus, hypertension, depression, dementia, breast cancer. Patient presented to the ED with complaints of difficulty breathing and wheezing over the past 2 days. Daughter present at bedside helps with most of the history.She reports patient has some mild diarrhea previously. No fevers, no chills, no vomiting. No lower extremity swelling. No chest pain. Denies dysuria. No dizziness. She is unaware of a fast heart rate.  Patient was in the ED yesterday with complaints of hyperglycemia but blood sugars were 215, also with increased sleepiness, and diarrhea. But patient left AMA,with her caregiver,per notes because"it was taking too long".  ED Course:O2 sats with ambulation down to 85%, with increasing respiratory distress. Heart rate initially mostly 80s to 109. But onmy evaluation patient's heart rate jumped up to the180s. Patient had just completed an hour-long nebulizer treatment. WBC 13.4. Chest x-ray suggestsmultifocal pneumonia. IV ceftriaxone given. Hospitalist to admit for asthma and pneumonia.  4/7: Patient was admitted with reactive airway disease/asthma exacerbation in the setting of community-acquired multifocal pneumonia and was also noted to have atrial fibrillation/flutter with RVR.  She is currently well controlled on oral amiodarone.  She has been seen by palliative care today with recommendations to remain DNR after goals of care discussion.  PT evaluation also with recommendations for SNF on discharge.  Continue current antibiotics.  Voiding trial today with removal of Foley catheter to see how this is tolerated.  Anticipate discharge to SNF in 1-2 days.  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 -Continue current antibiotics -Continue to follow culture results -Continue supportive care -Continue nebulizer management and start steroids tapering (now down to once a day dose of solumedrol). Hopefully transition to oral management in the next 24 hours.  -follow clinical response.  2-A. Fib/atrial flutter with RVR -Heart rate remained irregular and difficult to control; patient failed management with the use of Cardizem drip (max), metoprolol and digoxin. -Converted to SR and is rate controlled.  Continue amiodarone per cardiology recommendations -Continue as needed beta-blocker. -CHADsVASC score 2-3 -started on eliquis. 3-type 2 diabetes mellitus -With hyperglycemia in the setting of steroids usage -A1c 6.8 -Continue sliding scale insulin and added lantus.  4-dementia with behavioral disturbances -Continue Aricept -Continue Risperdal -Continue supportive care and constant reorientation  -Continue as needed Tylenol.  5-hyperlipidemia -Continue Zocor at time of discharge -Currently on hold to minimize the chances of QT prolongation while using amiodarone and as needed Haldol..  6-hx of breast cancer -continue Arimidex.  7-dysphagia -Continue speech therapy evaluation -Currently with regular consistency, but requiring nectar thick liquid. -MBS  pending.  8-hypokalemia-resolved -Recheck a.m. BMP  DVT prophylaxis: eliquis Code Status:  DNR Family Communication: No family at bedside.  Husband and daughter updated over phone Disposition Plan: Continue nebulizer management, continue steroids, continue  antibiotics and oral amiodarone as recommended by cardiology service.    PT evaluation with recommendations for SNF.  Appreciate palliative care consultation with patient now DNR.  Plan for discharge to SNF in the next 1-2 days.  Consultants:   Cardiology  Palliative care  Procedures:   See below for x-ray report  2D echo:  1. Left ventricular ejection fraction, by estimation, is 50 to 55%. The  left ventricle has low normal function. The left ventricle has no regional  wall motion abnormalities. There is mild concentric left ventricular  hypertrophy. Left ventricular  diastolic function could not be evaluated.  2. Right ventricular systolic function is normal. The right ventricular  size is normal. There is normal pulmonary artery systolic pressure. The  estimated right ventricular systolic pressure is [id number] mmHg.  3. Left atrial size was mildly dilated.  4. The mitral valve is normal in structure. Trivial mitral valve  regurgitation.  5. The aortic valve is normal in structure. Aortic valve regurgitation is  not visualized. No aortic stenosis is present.  6. The inferior vena cava is dilated in size with <50% respiratory  variability, suggesting right atrial pressure of 15 mmHg.   Antimicrobials:  Anti-infectives (From admission, onward)   Start     Dose/Rate Route Frequency Ordered Stop   12/17/19 1500  azithromycin (ZITHROMAX) tablet 500 mg     500 mg Oral Daily 12/16/19 1819     12/17/19 1500  cefTRIAXone (ROCEPHIN) 1 g in sodium chloride 0.9 % 100 mL IVPB     1 g 200 mL/hr over 30 Minutes Intravenous Every 24 hours 12/16/19 1819     12/16/19 1530  cefTRIAXone (ROCEPHIN) 1 g in sodium chloride 0.9 % 100 mL  IVPB     1 g 200 mL/hr over 30 Minutes Intravenous  Once 12/16/19 1520 12/16/19 1618   12/16/19 1530  azithromycin (ZITHROMAX) tablet 500 mg     500 mg Oral  Once 12/16/19 1520 12/16/19 1538       Subjective: Patient seen and evaluated today with no new acute complaints or concerns. No acute concerns or events noted overnight.  Objective: Vitals:   12/20/19 0354 12/20/19 0808 12/20/19 0814 12/20/19 0836  BP: (!) 154/53   (!) 149/78  Pulse: (!) 57   77  Resp: 16   19  Temp: 97.9 F (36.6 C)   97.6 F (36.4 C)  TempSrc: Oral   Oral  SpO2: 99% 96% 100% 96%  Weight:      Height:        Intake/Output Summary (Last 24 hours) at 12/20/2019 1533 Last data filed at 12/20/2019 1300 Gross per 24 hour  Intake 480 ml  Output 2975 ml  Net -2495 ml   Filed Weights   12/18/19 0300 12/18/19 0742 12/19/19 0515  Weight: 68 kg 66 kg 67.1 kg    Examination:  General exam: Appears calm and comfortable  Respiratory system: Clear to auscultation. Respiratory effort normal.  Currently on room air. Cardiovascular system: S1 & S2 heard, RRR. No JVD, murmurs, rubs, gallops or clicks. No pedal edema. Gastrointestinal system: Abdomen is nondistended, soft and nontender. No organomegaly or masses felt. Normal bowel sounds heard. Central nervous system: Alert and awake Extremities: Symmetric 5 x 5 power. Skin: No rashes, lesions or ulcers Psychiatry: Flat affect.    Data Reviewed: I have personally reviewed following labs and imaging studies  CBC: Recent Labs  Lab 12/16/19 1238 12/17/19 0438 12/20/19 0422  WBC 13.4* 10.1 11.3*  NEUTROABS 11.3*  --   --   HGB 12.6 10.8* 11.7*  HCT 39.1 34.4* 36.4  MCV 92.2 92.0 91.5  PLT 219 204 [id number]   Basic Metabolic Panel: Recent Labs  Lab 12/16/19 1238 12/17/19 0438 12/19/19 0357  NA 139 139 140  K 3.4* 3.9 4.4  CL 101 107 107  CO2 27 23 24   GLUCOSE 225* 313* 296*  BUN 18 28* 30*  CREATININE 0.70 0.86 0.73  CALCIUM 9.0 8.2* 8.5*  MG 1.8   --  2.1   GFR: Estimated Creatinine Clearance: 53.7 mL/min (by C-G formula based on SCr of 0.73 mg/dL). Liver Function Tests: Recent Labs  Lab 12/16/19 1238  AST 17  ALT 17  ALKPHOS 87  BILITOT 1.0  PROT 7.3  ALBUMIN 3.5   No results for input(s): LIPASE, AMYLASE in the last 168 hours. No results for input(s): AMMONIA in the last 168 hours. Coagulation Profile: No results for input(s): INR, PROTIME in the last 168 hours. Cardiac Enzymes: No results for input(s): CKTOTAL, CKMB, CKMBINDEX, TROPONINI in the last 168 hours. BNP (last 3 results) No results for input(s): PROBNP in the last 8760 hours. HbA1C: No results for input(s): HGBA1C in the last 72 hours. CBG: Recent Labs  Lab 12/19/19 1120 12/19/19 1637 12/20/19 0013 12/20/19 0804 12/20/19 1158  GLUCAP 278* 220* 138* 117* 280*   Lipid Profile: No results for input(s): CHOL, HDL, LDLCALC, TRIG, CHOLHDL, LDLDIRECT in the last 72 hours. Thyroid Function Tests: No results for input(s): TSH, T4TOTAL, FREET4, T3FREE, THYROIDAB in the last 72 hours. Anemia Panel: No results for input(s): VITAMINB12, FOLATE, FERRITIN, TIBC, IRON, RETICCTPCT in the last 72 hours. Sepsis Labs: Recent Labs  Lab 12/16/19 1921 12/16/19 2332  LATICACIDVEN 2.2* 1.4    Recent Results (from the past 240 hour(s))  Respiratory Panel by RT PCR (Flu A&B, Covid) - Nasopharyngeal Swab     Status: None   Collection Time: 12/16/19 12:56 PM   Specimen: Nasopharyngeal Swab  Result Value Ref Range Status   SARS Coronavirus 2 by RT PCR NEGATIVE NEGATIVE Final    Comment: (NOTE) SARS-CoV-2 target nucleic acids are NOT DETECTED. The SARS-CoV-2 RNA is generally detectable in upper respiratoy specimens during the acute phase of infection. The lowest concentration of SARS-CoV-2 viral copies this assay can detect is 131 copies/mL. A negative result does not preclude SARS-Cov-2 infection and should not be used as the sole basis for treatment or other  patient management decisions. A negative result may occur with  improper specimen collection/handling, submission of specimen other than nasopharyngeal swab, presence of viral mutation(s) within the areas targeted by this assay, and inadequate number of viral copies (<131 copies/mL). A negative result must be combined with clinical observations, patient history, and epidemiological information. The expected result is Negative. Fact Sheet for Patients:  PinkCheek.be Fact Sheet for Healthcare Providers:  GravelBags.it This test is not yet ap proved or cleared by the [place] FDA and  has been authorized for detection and/or diagnosis of SARS-CoV-2 by FDA under an Emergency Use Authorization (EUA). This EUA will remain  in effect (meaning this test can be used) for the duration of the COVID-19 declaration under Section 564(b)(1) of the Act, 21 U.S.C. section 360bbb-3(b)(1), unless the authorization is terminated or revoked sooner.    Influenza A by PCR NEGATIVE NEGATIVE Final   Influenza B by PCR NEGATIVE NEGATIVE Final    Comment: (NOTE) The Xpert Xpress SARS-CoV-2/FLU/RSV assay is intended as an aid in  the  diagnosis of influenza from Nasopharyngeal swab specimens and  should not be used as a sole basis for treatment. Nasal washings and  aspirates are unacceptable for Xpert Xpress SARS-CoV-2/FLU/RSV  testing. Fact Sheet for Patients: PinkCheek.be Fact Sheet for Healthcare Providers: GravelBags.it This test is not yet approved or cleared by the [place] FDA and  has been authorized for detection and/or diagnosis of SARS-CoV-2 by  FDA under an Emergency Use Authorization (EUA). This EUA will remain  in effect (meaning this test can be used) for the duration of the  Covid-19 declaration under Section 564(b)(1) of the Act, 21  U.S.C. section 360bbb-3(b)(1), unless  the authorization is  terminated or revoked. Performed at [facility], [address], [place], [place] [address]   MRSA PCR Screening     Status: None   Collection Time: 12/16/19  5:38 PM   Specimen: Nasal Mucosa; Nasopharyngeal  Result Value Ref Range Status   MRSA by PCR NEGATIVE NEGATIVE Final    Comment:        The GeneXpert MRSA Assay (FDA approved for NASAL specimens only), is one component of a comprehensive MRSA colonization surveillance program. It is not intended to diagnose MRSA infection nor to guide or monitor treatment for MRSA infections. Performed at [facility], [address] [place],  [address]          Radiology Studies: No results found.      Scheduled Meds: . amiodarone  400 mg Oral BID  . anastrozole  1 mg Oral Daily  . apixaban  5 mg Oral BID  . azithromycin  500 mg Oral Daily  . budesonide (PULMICORT) nebulizer solution  0.5 mg Nebulization BID  . Chlorhexidine Gluconate Cloth  6 each Topical Daily  . donepezil  5 mg Oral QHS  . insulin aspart  0-15 Units Subcutaneous TID WC  . insulin aspart  0-5 Units Subcutaneous QHS  . insulin glargine  7 Units Subcutaneous Daily  . methylPREDNISolone (SOLU-MEDROL) injection  40 mg Intravenous Daily  . risperiDONE  0.5 mg Oral BID   Continuous Infusions: . cefTRIAXone (ROCEPHIN)  IV 1 g (12/19/19 1503)     LOS: 4 days    Time spent: 35 minutes    Terren Jandreau Darleen Crocker, DO [facility]  If 7PM-7AM, please contact night-coverage [email or web address] 12/20/2019, 3:33 PM

## 2019-12-20 NOTE — NC FL2 (Signed)
[place] LEVEL OF CARE SCREENING TOOL     IDENTIFICATION  Patient Name: Amy Hoffman Birthdate: 10-08-1940 Sex: female Admission Date (Current Location): 12/16/2019  [facility] and [place] Number:  [employer] and Address:  [place] [address], [place]      Provider Number: [id number]  Attending Physician Name and Address:  Rodena Goldmann, DO  Relative Name and Phone Number:       Current Level of Care: Hospital Recommended Level of Care: [place] Prior Approval Number:    Date Approved/Denied:   PASRR Number:    Discharge Plan: SNF    Current Diagnoses: Patient Active Problem List   Diagnosis Date Noted  . Advanced care planning/counseling discussion   . Goals of care, counseling/discussion   . Acute respiratory failure with hypoxia ([place])   . Dementia with behavioral disturbance ([place])   . Palliative care by specialist   . Asthma exacerbation 12/16/2019  . Atrial fibrillation with rapid ventricular response ([place]) 12/16/2019  . Community acquired pneumonia 12/16/2019  . Hyperlipidemia, unspecified   . Obesity, unspecified   . Type 2 diabetes mellitus with hyperglycemia ([place])   . Abnormal uterine and vaginal bleeding, unspecified   . Allergic rhinitis due to pollen   . Essential (primary) hypertension   . Major depressive disorder, single episode, unspecified   . Unspecified asthma with (acute) exacerbation   . Unspecified asthma, uncomplicated   . 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 Unspecified osteoarthritis, unspecified site   . Pain in joint involving shoulder region   . Dysphagia 07/10/2019  . Loss of weight 07/10/2019  . Numbness and tingling in hands 04/17/2014  . SOB (shortness of breath) 01/30/2014  . Breast CA ([place]) 01/30/2014  . Elevated blood pressure 01/30/2014    Orientation RESPIRATION BLADDER Height & Weight     Self    Incontinent Weight: 147  lb 14.9 oz (67.1 kg) Height:  5\' 4"  (162.6 cm)  BEHAVIORAL SYMPTOMS/MOOD NEUROLOGICAL BOWEL NUTRITION STATUS      Continent Diet(see dc summary)  AMBULATORY STATUS COMMUNICATION OF NEEDS Skin   Extensive Assist Verbally Normal                       Personal Care Assistance Level of Assistance  Bathing, Feeding, Dressing Bathing Assistance: Limited assistance Feeding assistance: Independent Dressing Assistance: Limited assistance     Functional Limitations Info  Sight, Hearing, Speech Sight Info: Adequate Hearing Info: Adequate Speech Info: Adequate    SPECIAL CARE FACTORS FREQUENCY  PT (By licensed PT), OT (By licensed OT)     PT Frequency: 5 x week OT Frequency: 3x week            Contractures Contractures Info: Not present    Additional Factors Info  Code Status, Allergies, Psychotropic Code Status Info: DNR Allergies Info: Codeine, Zantac Psychotropic Info: Risperdal         Current Medications (12/20/2019):  This is the current hospital active medication list Current Facility-Administered Medications  Medication Dose Route Frequency Provider Last Rate Last Admin  . acetaminophen (TYLENOL) tablet 650 mg  650 mg Oral Q6H PRN Barton Dubois, MD   650 mg at 12/19/19 1303   Or  . acetaminophen (TYLENOL) suppository 650 mg  650 mg Rectal Q6H PRN Barton Dubois, MD      . amiodarone (PACERONE) tablet 400 mg  400 mg Oral BID Barton Dubois, MD  400 mg at 12/20/19 0939  . anastrozole (ARIMIDEX) tablet 1 mg  1 mg Oral Daily Barton Dubois, MD   1 mg at 12/20/19 0940  . apixaban (ELIQUIS) tablet 5 mg  5 mg Oral BID Barton Dubois, MD   5 mg at 12/20/19 0939  . azithromycin (ZITHROMAX) tablet 500 mg  500 mg Oral Daily Barton Dubois, MD   500 mg at 12/20/19 0939  . budesonide (PULMICORT) nebulizer solution 0.5 mg  0.5 mg Nebulization BID Barton Dubois, MD   0.5 mg at 12/20/19 0813  . cefTRIAXone (ROCEPHIN) 1 g in sodium chloride 0.9 % 100 mL IVPB  1 g Intravenous  Q24H Barton Dubois, MD 200 mL/hr at 12/20/19 1616 1 g at 12/20/19 1616  . Chlorhexidine Gluconate Cloth 2 % PADS 6 each  6 each Topical Daily Barton Dubois, MD   6 each at 12/19/19 0957  . donepezil (ARICEPT) tablet 5 mg  5 mg Oral QHS Barton Dubois, MD   5 mg at 12/19/19 2116  . haloperidol lactate (HALDOL) injection 0.5 mg  0.5 mg Intravenous Q8H PRN Barton Dubois, MD   0.5 mg at 12/18/19 2153  . hydrALAZINE (APRESOLINE) tablet 25 mg  25 mg Oral Q6H PRN Oswald Hillock, MD   25 mg at 12/20/19 0123  . insulin aspart (novoLOG) injection 0-15 Units  0-15 Units Subcutaneous TID WC Barton Dubois, MD   8 Units at 12/20/19 1622  . insulin aspart (novoLOG) injection 0-5 Units  0-5 Units Subcutaneous QHS Barton Dubois, MD   4 Units at 12/16/19 2150  . insulin glargine (LANTUS) injection 7 Units  7 Units Subcutaneous Daily Barton Dubois, MD   7 Units at 12/20/19 1613  . levalbuterol (XOPENEX) nebulizer solution 0.63 mg  0.63 mg Nebulization Q6H PRN Barton Dubois, MD   0.63 mg at 12/20/19 0807  . LORazepam (ATIVAN) injection 0.5 mg  0.5 mg Intravenous Q8H PRN Barton Dubois, MD   0.5 mg at 12/19/19 2014  . methylPREDNISolone sodium succinate (SOLU-MEDROL) 40 mg/mL injection 40 mg  40 mg Intravenous Daily Barton Dubois, MD   40 mg at 12/20/19 0940  . ondansetron (ZOFRAN) tablet 4 mg  4 mg Oral Q6H PRN Barton Dubois, MD       Or  . ondansetron [facility]) injection 4 mg  4 mg Intravenous Q6H PRN Barton Dubois, MD      . risperiDONE (RISPERDAL) tablet 0.5 mg  0.5 mg Oral BID Barton Dubois, MD   0.5 mg at 12/20/19 0940     Discharge Medications: Please see discharge summary for a list of discharge medications.  Relevant Imaging Results:  Relevant Lab Results:   Additional Information SSN: [place]  Shade Flood, LCSW

## 2019-12-20 NOTE — TOC Initial Note (Signed)
Transition of Care [facility]) - Initial/Assessment Note    Patient Details  Name: Amy Hoffman MRN: [id number] Date of Birth: 1941/04/13  Transition of Care [facility]) CM/SW Contact:    Sherie Don, LCSW Phone Number: 12/20/2019, 4:38 PM  Clinical Narrative: [facility] received consult for SNF placement. CSW met with daughter, Denny Peon, to complete initial assessment. Per daughter, family's preference for SNF placement is [facility] but they are willing to consider other SNFs in [place] and [place] if [facility] does not have bed availability. CSW discussed with daughter that most SNFs are continuing to limit or restrict visitation due to COVID and encouraged daughter to ask preferred facilities about visitation. CSW emailed CMS choice list for SNFs.           Expected Discharge Plan: Skilled Nursing Facility Barriers to Discharge: Continued Medical Work up  Patient Goals and CMS Choice Patient states their goals for this hospitalization and ongoing recovery are:: Discharge to SNF for rehab CMS Medicare.gov Compare Post Acute Care list provided to:: Patient Represenative (must comment)([name] Carron Brazen (daughter) [facility]: [phone]) Choice offered to / list presented to : Adult Children  Expected Discharge Plan and Services Expected Discharge Plan: [place] Acute Care Choice: [place] Living arrangements for the past 2 months: Single Family Home  Prior Living Arrangements/Services Living arrangements for the past 2 months: Single Family Home Lives with:: Spouse Patient language and need for interpreter reviewed:: Yes Do you feel safe going back to the place where you live?: Yes      Need for Family Participation in Patient Care: Yes (Comment)(Patient has dementia) Care giver support system in place?: Yes (comment)   Criminal Activity/Legal Involvement Pertinent to Current Situation/Hospitalization: No - Comment as needed  Activities of Daily Living Home  Assistive Devices/Equipment: CBG Meter ADL Screening (condition at time of admission) Patient's cognitive ability adequate to safely complete daily activities?: Yes Is the patient deaf or have difficulty hearing?: No Does the patient have difficulty seeing, even when wearing glasses/contacts?: No Does the patient have difficulty concentrating, remembering, or making decisions?: Yes Patient able to express need for assistance with ADLs?: Yes Does the patient have difficulty dressing or bathing?: Yes Independently performs ADLs?: Yes (appropriate for developmental age) Does the patient have difficulty walking or climbing stairs?: No Weakness of Legs: None Weakness of Arms/Hands: None  Permission Sought/Granted    Emotional Assessment Appearance:: Appears stated age Attitude/Demeanor/Rapport: Unable to Assess Affect (typically observed): Unable to Assess Orientation: : Fluctuating Orientation (Suspected and/or reported Sundowners) Alcohol / Substance Use: Not Applicable    Admission diagnosis:  Atrial fibrillation with rapid ventricular response (HCC) [I48.91] Acute respiratory failure with hypoxia (HCC) [J96.01] Asthma exacerbation [J45.901] Community acquired pneumonia, unspecified laterality [J18.9] Patient Active Problem List   Diagnosis Date Noted  . Advanced care planning/counseling discussion   . Goals of care, counseling/discussion   . Acute respiratory failure with hypoxia ([place])   . Dementia with behavioral disturbance ([place])   . Palliative care by specialist   . Asthma exacerbation 12/16/2019  . Atrial fibrillation with rapid ventricular response ([place]) 12/16/2019  . Community acquired pneumonia 12/16/2019  . Hyperlipidemia, unspecified   . Obesity, unspecified   . Type 2 diabetes mellitus with hyperglycemia ([place])   . Abnormal uterine and vaginal bleeding, unspecified   . Allergic rhinitis due to pollen   . Essential (primary) hypertension   . Major depressive  disorder, single episode, unspecified   . Unspecified asthma with (acute)  exacerbation   . Unspecified asthma, uncomplicated   . 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 Unspecified osteoarthritis, unspecified site   . Pain in joint involving shoulder region   . Dysphagia 07/10/2019  . Loss of weight 07/10/2019  . Numbness and tingling in hands 04/17/2014  . SOB (shortness of breath) 01/30/2014  . Breast CA ([place]) 01/30/2014  . Elevated blood pressure 01/30/2014   PCP:  [name], NP Pharmacy:   [facility], [place] [address] Phone: [phone] Fax: [phone]  Readmission Risk Interventions No flowsheet data found.

## 2019-12-20 NOTE — Evaluation (Signed)
Physical Therapy Evaluation Patient Details Name: Amy Hoffman MRN: [id number] DOB: 01/19/1941 Today's Date: 12/20/2019   History of Present Illness  Amy Hoffman is a 79 y.o. female with medical history significant for diabetes mellitus, hypertension, depression, dementia, breast cancer.Patient presented to the ED with complaints of difficulty breathing and wheezing over the past 2 days.  Daughter present at bedside helps with most of the history.  She reports patient has some mild diarrhea previously.No fevers, no chills, no vomiting.  No lower extremity swelling.  No chest pain.  Denies dysuria.  No dizziness.  She is unaware of a fast heart rate.    Clinical Impression  Patient demonstrates slow labored movement for sitting up at bedside, had difficulty scooting to EOB due to weakness, very unsteady on feet with near loss of balance, frequent buckling of knees and limited to a few side steps due to fall risk.  Patient tolerated sitting up in chair after therapy - RN notified.  Patient will benefit from continued physical therapy in hospital and recommended venue below to increase strength, balance, endurance for safe ADLs and gait.     Follow Up Recommendations SNF    Equipment Recommendations  None recommended by PT    Recommendations for Other Services       Precautions / Restrictions Precautions Precautions: Fall Restrictions Weight Bearing Restrictions: No      Mobility  Bed Mobility Overal bed mobility: Needs Assistance Bed Mobility: Sit to Supine       Sit to supine: Min assist   General bed mobility comments: slow labored movement with most difficulty scooting to EOB  Transfers Overall transfer level: Needs assistance Equipment used: Rolling walker (2 wheeled) Transfers: Sit to/from [facility] Sit to Stand: Mod assist Stand pivot transfers: Mod assist;Max assist       General transfer comment: unsteady on feet, flexed  trunk  Ambulation/Gait Ambulation/Gait assistance: Max assist Gait Distance (Feet): 4 Feet Assistive device: Rolling walker (2 wheeled) Gait Pattern/deviations: Decreased step length - left;Decreased stance time - right;Decreased stride length Gait velocity: slow   General Gait Details: limited to 4-5 unsteady labored side steps with frequent buckling of knees due to weakness  Stairs            Wheelchair Mobility    Modified Rankin (Stroke Patients Only)       Balance Overall balance assessment: Needs assistance Sitting-balance support: Feet supported;No upper extremity supported Sitting balance-Leahy Scale: Fair Sitting balance - Comments: fair/good seated at EOB   Standing balance support: During functional activity;Bilateral upper extremity supported Standing balance-Leahy Scale: Poor Standing balance comment: using RW                             Pertinent Vitals/Pain Pain Assessment: No/denies pain    Home Living Family/patient expects to be discharged to:: Private residence Living Arrangements: Spouse/significant other Available Help at Discharge: Family;Available PRN/intermittently Type of Home: House Home Access: Ramped entrance     Home Layout: Two level;Able to live on main level with bedroom/bathroom Home Equipment: Kasandra Knudsen - single point;Wheelchair - [employer];Shower seat;Walker - 2 wheels      Prior Function Level of Independence: Independent with assistive device(s)         Comments: Household and short distanced community ambulator with [facility]     Hand Dominance        Extremity/Trunk Assessment   Upper Extremity Assessment Upper Extremity Assessment: Generalized weakness  Lower Extremity Assessment Lower Extremity Assessment: Generalized weakness    Cervical / Trunk Assessment Cervical / Trunk Assessment: Kyphotic  Communication   Communication: No difficulties  Cognition Arousal/Alertness:  Awake/alert Behavior During Therapy: WFL for tasks assessed/performed Overall Cognitive Status: History of cognitive impairments - at baseline                                        General Comments      Exercises     Assessment/Plan    PT Assessment Patient needs continued PT services  PT Problem List Decreased strength;Decreased activity tolerance;Decreased balance;Decreased mobility       PT Treatment Interventions Gait training;Stair training;Functional mobility training;Therapeutic activities;Therapeutic exercise;Patient/family education;Balance training    PT Goals (Current goals can be found in the Care Plan section)  Acute Rehab PT Goals Patient Stated Goal: return home with family to assist PT Goal Formulation: With patient Time For Goal Achievement: 01/03/20 Potential to Achieve Goals: Good    Frequency Min 3X/week   Barriers to discharge        Co-evaluation               AM-PAC PT "6 Clicks" Mobility  Outcome Measure Help needed turning from your back to your side while in a flat bed without using bedrails?: A Little Help needed moving from lying on your back to sitting on the side of a flat bed without using bedrails?: A Lot Help needed moving to and from a bed to a chair (including a wheelchair)?: A Lot Help needed standing up from a chair using your arms (e.g., wheelchair or bedside chair)?: A Lot Help needed to walk in hospital room?: A Lot Help needed climbing 3-5 steps with a railing? : Total 6 Click Score: 12    End of Session   Activity Tolerance: Patient tolerated treatment well;Patient limited by fatigue Patient left: in chair;with call bell/phone within reach;with chair alarm set Nurse Communication: Mobility status PT Visit Diagnosis: Unsteadiness on feet (R26.81);Other abnormalities of gait and mobility (R26.89);Muscle weakness (generalized) (M62.81)    Time: [id number] PT Time Calculation (min) (ACUTE ONLY): 28  min   Charges:   PT Evaluation $PT Eval Moderate Complexity: 1 Mod PT Treatments $Therapeutic Activity: 23-37 mins        2:06 PM, 12/20/19 Lonell Grandchild, MPT Physical Therapist with [facility] 336 [phone] office [phone] mobile phone

## 2019-12-20 NOTE — Progress Notes (Signed)
Telemetry reviewed, remains in SR. [place] for discharge today on oral amio 400mg  bid x 7 days, then 200mg  bid x 2 weeks, then 200mg  daily. We will arrange f/u.     Zandra Abts MD

## 2019-12-21 LAB — BASIC METABOLIC PANEL
Anion gap: 7 (ref 5–15)
BUN: 18 mg/dL (ref 8–23)
CO2: 31 mmol/L (ref 22–32)
Calcium: 8.2 mg/dL — ABNORMAL LOW (ref 8.9–10.3)
Chloride: 100 mmol/L (ref 98–111)
Creatinine, Ser: 0.58 mg/dL (ref 0.44–1.00)
GFR calc Af Amer: >60 mL/min (ref >60)
GFR calc non Af Amer: >60 mL/min (ref >60)
Glucose, Bld: 129 mg/dL — ABNORMAL HIGH (ref 70–99)
Potassium: 3.6 mmol/L (ref 3.5–5.1)
Sodium: 138 mmol/L (ref 135–145)

## 2019-12-21 LAB — GLUCOSE, CAPILLARY
Glucose-Capillary: 124 mg/dL — ABNORMAL HIGH (ref 70–99)
Glucose-Capillary: 224 mg/dL — ABNORMAL HIGH (ref 70–99)
Glucose-Capillary: 246 mg/dL — ABNORMAL HIGH (ref 70–99)
Glucose-Capillary: 359 mg/dL — ABNORMAL HIGH (ref 70–99)

## 2019-12-21 LAB — MAGNESIUM: Magnesium: 2 mg/dL (ref 1.7–2.4)

## 2019-12-21 LAB — SARS CORONAVIRUS 2 (TAT 6-24 HRS): SARS Coronavirus 2: NEGATIVE

## 2019-12-21 MED ORDER — PREDNISONE 20 MG PO TABS
40.0000 mg | ORAL_TABLET | Freq: Every day | ORAL | Status: DC
  Administered 2019-12-22: 10:00:00 40 mg via ORAL
  Filled 2019-12-21: qty 2

## 2019-12-21 MED ORDER — POTASSIUM CHLORIDE CRYS ER 20 MEQ PO TBCR
40.0000 meq | EXTENDED_RELEASE_TABLET | Freq: Once | ORAL | Status: AC
  Administered 2019-12-21: 16:00:00 40 meq via ORAL
  Filled 2019-12-21: qty 2

## 2019-12-21 NOTE — Progress Notes (Signed)
Patient removing telemetry monitor, reapplied by nursing staff and removed again by patient. Notified Dr Manuella Ghazi and asked if it could be discontinued if possible. Nursing will continue to monitor.

## 2019-12-21 NOTE — Progress Notes (Signed)
PROGRESS NOTE    Amy Hoffman  [id number] DOB: 1940/12/11 DOA: 12/16/2019 PCP: [name], NP   Brief Narrative:  As per H&P written by Dr. Denton Brick on 12/16/2019 78 y.o.femalewith medical history significant fordiabetes mellitus, hypertension, depression, dementia, breast cancer. Patient presented to the ED with complaints of difficulty breathing and wheezing over the past 2 days. Daughter present at bedside helps with most of the history.She reports patient has some mild diarrhea previously. No fevers, no chills, no vomiting. No lower extremity swelling. No chest pain. Denies dysuria. No dizziness. She is unaware of a fast heart rate.  Patient was in the ED yesterday with complaints of hyperglycemia but blood sugars were 215, also with increased sleepiness, and diarrhea. But patient left AMA,with her caregiver,per notes because"it was taking too long".  ED Course:O2 sats with ambulation down to 85%, with increasing respiratory distress. Heart rate initially mostly 80s to 109. But onmy evaluation patient's heart rate jumped up to the180s. Patient had just completed an hour-long nebulizer treatment. WBC 13.4. Chest x-ray suggestsmultifocal pneumonia. IV ceftriaxone given. Hospitalist to admit for asthma and pneumonia.  4/7: Patient was admitted with reactive airway disease/asthma exacerbation in the setting of community-acquired multifocal pneumonia and was also noted to have atrial fibrillation/flutter with RVR.  She is currently well controlled on oral amiodarone.  She has been seen by palliative care today with recommendations to remain DNR after goals of care discussion.  PT evaluation also with recommendations for SNF on discharge.  Continue current antibiotics.  Voiding trial today with removal of Foley catheter to see how this is tolerated.  Anticipate discharge to SNF in 1-2 days.  4/8: Patient does not require Foley catheter at this time.  Repeat Covid  testing has been ordered.  Antibiotics to be discontinued today.  Plan to transition to oral steroids today and start decreasing dosage based on some mild hyperglycemia that has been noted.  Anticipate transfer to SNF in a.m.  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 -Continue current antibiotics -Continue to follow culture results -Continue supportive care -Continue nebulizer management andstartsteroidstapering (now down to once a day dose of solumedrol).  Insertion oral steroids today. -follow clinical response.  2-A. Fib/atrial flutterwith RVR -Heart rate remained irregular and difficult to control; patient failed management with the use of Cardizem drip (max), metoprolol and digoxin. -Converted to SR and is rate controlled.  Continue amiodarone per cardiology recommendations -Continue as needed beta-blocker. -CHADsVASC score 2-3 -started on eliquis. 3-type 2 diabetes mellitus -With hyperglycemia in the setting of steroids usage -A1c 6.8 -Continue sliding scale insulin and addedlantus.  4-dementia with behavioral disturbances -Continue Aricept -Continue Risperdal -Continue supportive care and constant reorientation  -Continue as needed Tylenol.  5-hyperlipidemia -Continue Zocor at time of discharge -Currently on hold to minimize the chances of QT  prolongation while using amiodarone and as needed Haldol..  6-hx of breast cancer -continue Arimidex.  7-dysphagia -Continue speech therapy evaluation -Currentlywith regular consistency, but  requiring nectar thick liquid. -MBS pending.  8-hypokalemia-resolved -Recheck a.m. BMP  DVT prophylaxis:eliquis Code Status: DNR Family Communication:No family at bedside. Husband and daughter updated over phone on 4/7. Disposition Plan:Continue nebulizer management, continue steroids, continue antibiotics and oral amiodarone as recommended by cardiology service.   PT evaluation with recommendations for SNF.  Appreciate palliative care consultation with patient now DNR.  Plan for discharge to SNF hopefully by a.m.  Consultants:  Cardiology  Palliative care  Procedures:  See below for x-ray report  2D echo:  1. Left ventricular ejection fraction, by estimation, is 50 to 55%. The  left ventricle has low normal function. The left ventricle has no regional  wall motion abnormalities. There is mild concentric left ventricular  hypertrophy. Left ventricular  diastolic function could not be evaluated.  2. Right ventricular systolic function is normal. The right ventricular  size is normal. There is normal pulmonary artery systolic pressure. The  estimated right ventricular systolic pressure is [id number] mmHg.  3. Left atrial size was mildly dilated.  4. The mitral valve is normal in structure. Trivial mitral valve  regurgitation.  5. The aortic valve is normal in structure. Aortic valve regurgitation is  not visualized. No aortic stenosis is present.  6. The inferior vena cava is dilated in size with <50% respiratory  variability, suggesting right atrial pressure of 15 mmHg.   Antimicrobials:  Anti-infectives (From admission, onward)   Start     Dose/Rate Route Frequency Ordered Stop   12/17/19 1500  azithromycin (ZITHROMAX) tablet 500 mg  Status:  Discontinued     500 mg  Oral Daily 12/16/19 1819 12/21/19 1028   12/17/19 1500  cefTRIAXone (ROCEPHIN) 1 g in sodium chloride 0.9 % 100 mL IVPB  Status:  Discontinued     1 g 200 mL/hr over 30 Minutes Intravenous Every 24 hours 12/16/19 1819 12/21/19 1028   12/16/19 1530  cefTRIAXone (ROCEPHIN) 1 g in sodium chloride 0.9 % 100 mL IVPB     1 g 200 mL/hr over 30 Minutes Intravenous  Once 12/16/19 1520 12/16/19 1618   12/16/19 1530  azithromycin (ZITHROMAX) tablet 500 mg     500 mg Oral  Once 12/16/19 1520 12/16/19 1538       Subjective: Patient seen and evaluated today with no new acute complaints or concerns. No acute concerns or events noted overnight.  Objective: Vitals:   12/20/19 2130 12/21/19 0543 12/21/19 0817 12/21/19 1449  BP: (!) 175/78 (!) 153/84  140/67  Pulse: 70 76  72  Resp: 20 14  19   Temp: 97.6 F (36.4 C) 98.3 F (36.8 C)  98.1 F (36.7 C)  TempSrc: Oral   Oral  SpO2: 98% 95% 96% 97%  Weight:      Height:        Intake/Output Summary (Last 24 hours) at 12/21/2019 1456 Last data filed at 12/21/2019 1304 Gross per 24 hour  Intake 360 ml  Output 325 ml  Net 35 ml   Filed Weights   12/18/19 0300 12/18/19 0742 12/19/19 0515  Weight: 68 kg 66 kg 67.1 kg    Examination:  General exam: Appears calm and comfortable  Respiratory system: Clear to auscultation. Respiratory effort normal. Cardiovascular system: S1 & S2 heard, RRR. No JVD, murmurs, rubs, gallops or clicks. No pedal edema. Gastrointestinal system: Abdomen is nondistended, soft and nontender. No organomegaly or masses felt. Normal bowel sounds heard. Central nervous system: Alert and oriented. No focal neurological deficits. Extremities: Symmetric 5 x 5 power.  Skin: No rashes, lesions or ulcers Psychiatry: Judgement and insight appear normal. Mood & affect appropriate.     Data Reviewed: I have personally reviewed following labs and imaging studies  CBC: Recent Labs  Lab 12/16/19 1238 12/17/19 0438 12/20/19 0422    WBC 13.4* 10.1 11.3*  NEUTROABS 11.3*  --   --   HGB 12.6 10.8* 11.7*  HCT 39.1 34.4* 36.4  MCV 92.2 92.0 91.5  PLT 219 204 [id number]   Basic Metabolic Panel: Recent Labs  Lab 12/16/19 1238 12/17/19 0438 12/19/19 0357 12/21/19 0445  NA 139 139 140 138  K 3.4* 3.9 4.4 3.6  CL 101 107 107 100  CO2 27 23 24 31   GLUCOSE 225* 313* 296* 129*  BUN 18 28* 30* 18  CREATININE 0.70 0.86 0.73 0.58  CALCIUM 9.0 8.2* 8.5* 8.2*  MG 1.8  --  2.1 2.0   GFR: Estimated Creatinine Clearance: 53.7 mL/min (by C-G formula based on SCr of 0.58 mg/dL). Liver Function Tests: Recent Labs  Lab 12/16/19 1238  AST 17  ALT 17  ALKPHOS 87  BILITOT 1.0  PROT 7.3  ALBUMIN 3.5   No results for input(s): LIPASE, AMYLASE in the last 168 hours. No results for input(s): AMMONIA in the last 168 hours. Coagulation Profile: No results for input(s): INR, PROTIME in the last 168 hours. Cardiac Enzymes: No results for input(s): CKTOTAL, CKMB, CKMBINDEX, TROPONINI in the last 168 hours. BNP (last 3 results) No results for input(s): PROBNP in the last 8760 hours. HbA1C: No results for input(s): HGBA1C in the last 72 hours. CBG: Recent Labs  Lab 12/20/19 1158 12/20/19 1617 12/20/19 2131 12/21/19 0758 12/21/19 1142  GLUCAP 280* 293* 215* 124* 224*   Lipid Profile: No results for input(s): CHOL, HDL, LDLCALC, TRIG, CHOLHDL, LDLDIRECT in the last 72 hours. Thyroid Function Tests: No results for input(s): TSH, T4TOTAL, FREET4, T3FREE, THYROIDAB in the last 72 hours. Anemia Panel: No results for input(s): VITAMINB12, FOLATE, FERRITIN, TIBC, IRON, RETICCTPCT in the last 72 hours. Sepsis Labs: Recent Labs  Lab 12/16/19 1921 12/16/19 2332  LATICACIDVEN 2.2* 1.4    Recent Results (from the past 240 hour(s))  Respiratory Panel by RT PCR (Flu A&B, Covid) - Nasopharyngeal Swab     Status: None   Collection Time: 12/16/19 12:56 PM   Specimen: Nasopharyngeal Swab  Result Value Ref Range Status   SARS  Coronavirus 2 by RT PCR NEGATIVE NEGATIVE Final    Comment: (NOTE) SARS-CoV-2 target nucleic acids are NOT DETECTED. The SARS-CoV-2 RNA is generally detectable in upper respiratoy specimens during the acute phase of infection. The lowest concentration of SARS-CoV-2 viral copies this assay can detect is 131 copies/mL. A negative result does not preclude SARS-Cov-2 infection and should not be used as the sole basis for treatment or other patient management decisions. A negative result may occur with  improper specimen collection/handling, submission of specimen other than nasopharyngeal swab, presence of viral mutation(s) within the areas targeted by this assay, and inadequate number of viral copies (<131 copies/mL). A negative result must be combined with clinical observations, patient history, and epidemiological information. The expected result is Negative. Fact Sheet for Patients:  PinkCheek.be Fact Sheet for Healthcare Providers:  GravelBags.it This test is not yet ap proved or cleared by the [place] FDA and  has been authorized for detection and/or diagnosis of SARS-CoV-2 by FDA under an Emergency Use Authorization (EUA). This EUA will remain  in effect (meaning this test can be used) for  the duration of the COVID-19 declaration under Section 564(b)(1) of the Act, 21 U.S.C. section 360bbb-3(b)(1), unless the authorization is terminated or revoked sooner.    Influenza A by PCR NEGATIVE NEGATIVE Final   Influenza B by PCR NEGATIVE NEGATIVE Final    Comment: (NOTE) The Xpert Xpress SARS-CoV-2/FLU/RSV assay is intended as an aid in  the diagnosis of influenza from Nasopharyngeal swab specimens and  should not be used as a sole basis for treatment. Nasal washings and  aspirates are unacceptable for Xpert Xpress SARS-CoV-2/FLU/RSV  testing. Fact Sheet for Patients: PinkCheek.be Fact Sheet  for Healthcare Providers: GravelBags.it This test is not yet approved or cleared by the [place] FDA and  has been authorized for detection and/or diagnosis of SARS-CoV-2 by  FDA under an Emergency Use Authorization (EUA). This EUA will remain  in effect (meaning this test can be used) for the duration of the  Covid-19 declaration under Section 564(b)(1) of the Act, 21  U.S.C. section 360bbb-3(b)(1), unless the authorization is  terminated or revoked. Performed at [facility], [address], [place], [place] [address]   MRSA PCR Screening     Status: None   Collection Time: 12/16/19  5:38 PM   Specimen: Nasal Mucosa; Nasopharyngeal  Result Value Ref Range Status   MRSA by PCR NEGATIVE NEGATIVE Final    Comment:        The GeneXpert MRSA Assay (FDA approved for NASAL specimens only), is one component of a comprehensive MRSA colonization surveillance program. It is not intended to diagnose MRSA infection nor to guide or monitor treatment for MRSA infections. Performed at [facility], [address], [place], [place] [address]          Radiology Studies: No results found.      Scheduled Meds: . amiodarone  400 mg Oral BID  . anastrozole  1 mg Oral Daily  . apixaban  5 mg Oral BID  . budesonide (PULMICORT) nebulizer solution  0.5 mg Nebulization BID  . Chlorhexidine Gluconate Cloth  6 each Topical Daily  . donepezil  5 mg Oral QHS  . insulin aspart  0-15 Units Subcutaneous TID WC  . insulin aspart  0-5 Units Subcutaneous QHS  . insulin glargine  7 Units Subcutaneous Daily  . methylPREDNISolone (SOLU-MEDROL) injection  40 mg Intravenous Daily  . potassium chloride  40 mEq Oral Once  . risperiDONE  0.5 mg Oral BID   Continuous Infusions:   LOS: 5 days    Time spent: 30 minutes    Kamariyah Timberlake Darleen Crocker, DO [facility]  If 7PM-7AM, please contact night-coverage [email or web address] 12/21/2019, 2:56 PM

## 2019-12-21 NOTE — TOC Progression Note (Signed)
Transition of Care [facility]) - Progression Note    Patient Details  Name: Amy Hoffman MRN: [id number] Date of Birth: 14-Dec-1940  Transition of Care [facility]) CM/SW Contact  Shade Flood, LCSW Phone Number: 12/21/2019, 1:29 PM  Clinical Narrative:     TOC following. Have spoken with pt's daughter to provide bed offers. [facility] selected. Pt medically stable for dc but awaiting PASRR level II assessment. Pt will need new COVID within 48 hours of dc. Updated MD.  Will follow.  Expected Discharge Plan: [place] Barriers to Discharge: Continued Medical Work up  Expected Discharge Plan and Services Expected Discharge Plan: [place] Choice: [place] arrangements for the past 2 months: Single Family Home                                       Social Determinants of Health (SDOH) Interventions    Readmission Risk Interventions No flowsheet data found.

## 2019-12-21 NOTE — Progress Notes (Signed)
  Speech Language Pathology Treatment: Dysphagia  Patient Details Name: Amy Hoffman MRN: [id number] DOB: [birth year] Today's Date: 12/21/2019 Time: [id number] SLP Time Calculation (min) (ACUTE ONLY): 26 min  Assessment / Plan / Recommendation Clinical Impression  SLP provided ongoing diagnostic dysphagia therapy; Pt was sitting upright in chair with improved overall mentation. Pt's husband and daughter were present for evaluation and daughter provided hx reporting that Pt occasionally does get "choked" on thin liquids at home when she "drinks too fast" however, it does not happen regularly. Pt consumed trials of thin liquids via cup and straw without overt s/sx of aspiration, however note occasional audible swallow. Pt also consumed regular trials without overt s/sx of aspiration. Recommend continue with Regular diet and upgrade to THIN liquids. SLP reviewed precautions re: Pt should always be sitting upright for PO, Consume small bites/sips, and alternate bites and sips. SLP will continue to follow while in acute to ensure diet tolerance.    HPI HPI: Amy Hoffman is a 79 y.o. female with medical history significant for diabetes mellitus, hypertension, depression, dementia, breast cancer.      SLP Plan  Continue with current plan of care       Recommendations  Diet recommendations: Dysphagia 3 (mechanical soft);Thin liquid Liquids provided via: Cup;Straw Medication Administration: Whole meds with liquid Supervision: Patient able to self feed;Intermittent supervision to cue for compensatory strategies Compensations: Minimize environmental distractions;Slow rate;Small sips/bites;Follow solids with liquid Postural Changes and/or Swallow Maneuvers: Seated upright 90 degrees;Upright 30-60 min after meal                Oral Care Recommendations: Oral care BID Follow up Recommendations: 24 hour supervision/assistance SLP Visit Diagnosis: Dysphagia, unspecified (R13.10) Plan: Continue  with current plan of care       [place] [facility]. Roddie Mc, CCC-SLP Speech Language Pathologist   Wende Bushy 12/21/2019, 1:36 PM

## 2019-12-21 NOTE — Progress Notes (Signed)
Inpatient Diabetes Program Recommendations  AACE/ADA: New Consensus Statement on Inpatient Glycemic Control   Target Ranges:  Prepandial:   less than 140 mg/dL      Peak postprandial:   less than 180 mg/dL (1-2 hours)      Critically ill patients:  140 - 180 mg/dL   Results for Amy Hoffman, Amy Hoffman (MRN [id number]) as of 12/21/2019 09:03  Ref. Range 12/20/2019 08:04 12/20/2019 11:58 12/20/2019 16:17 12/20/2019 21:31 12/21/2019 07:58  Glucose-Capillary Latest Ref Range: 70 - 99 mg/dL 117 (H) 280 (H) 293 (H) 215 (H) 124 (H)   Review of Glycemic Control  Outpatient Diabetes medications:Amaryl 8 mg daily Current orders for Inpatient glycemic control:Lantus 7 units daily, Novolog 0-15 units TID with meals, Novolog 0-5 units QHS; Solumedrol 40 mg daily  Inpatient Diabetes Program Recommendations:  Insulin:If steroids are continued,please consider ordering Novolog 3 units TID with meals for meal coverage if patient eats at least 50% of meals.  Thanks, Barnie Alderman, RN, MSN, CDE Diabetes Coordinator Inpatient Diabetes Program [phone] (Team Pager from 8am to 5pm)

## 2019-12-21 NOTE — Plan of Care (Signed)
  Problem: Activity: Goal: Risk for activity intolerance will decrease Outcome: Progressing Note: OOB to [facility] & back to chair with 1 assist and rolling walker    Problem: Elimination: Goal: Will not experience complications related to urinary retention Outcome: Progressing

## 2019-12-22 DIAGNOSIS — R69 Illness, unspecified: Secondary | ICD-10-CM | POA: Diagnosis not present

## 2019-12-22 DIAGNOSIS — I10 Essential (primary) hypertension: Secondary | ICD-10-CM | POA: Diagnosis not present

## 2019-12-22 DIAGNOSIS — E1165 Type 2 diabetes mellitus with hyperglycemia: Secondary | ICD-10-CM | POA: Diagnosis not present

## 2019-12-22 DIAGNOSIS — M6281 Muscle weakness (generalized): Secondary | ICD-10-CM | POA: Diagnosis not present

## 2019-12-22 DIAGNOSIS — F039 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: Secondary | ICD-10-CM | POA: Diagnosis not present

## 2019-12-22 DIAGNOSIS — R1312 Dysphagia, oropharyngeal phase: Secondary | ICD-10-CM | POA: Diagnosis not present

## 2019-12-22 DIAGNOSIS — R2689 Other abnormalities of gait and mobility: Secondary | ICD-10-CM | POA: Diagnosis not present

## 2019-12-22 DIAGNOSIS — E119 Type 2 diabetes mellitus without complications: Secondary | ICD-10-CM | POA: Diagnosis not present

## 2019-12-22 DIAGNOSIS — F0280 Dementia in other diseases classified elsewhere without behavioral disturbance: Secondary | ICD-10-CM | POA: Diagnosis not present

## 2019-12-22 DIAGNOSIS — Z7401 Bed confinement status: Secondary | ICD-10-CM | POA: Diagnosis not present

## 2019-12-22 DIAGNOSIS — J4521 Mild intermittent asthma with (acute) exacerbation: Secondary | ICD-10-CM | POA: Diagnosis not present

## 2019-12-22 DIAGNOSIS — J45909 Unspecified asthma, uncomplicated: Secondary | ICD-10-CM | POA: Diagnosis not present

## 2019-12-22 DIAGNOSIS — F339 Major depressive disorder, recurrent, unspecified: Secondary | ICD-10-CM | POA: Diagnosis not present

## 2019-12-22 DIAGNOSIS — E785 Hyperlipidemia, unspecified: Secondary | ICD-10-CM | POA: Diagnosis not present

## 2019-12-22 DIAGNOSIS — F0391 Unspecified dementia with behavioral disturbance: Secondary | ICD-10-CM | POA: Diagnosis not present

## 2019-12-22 DIAGNOSIS — I4891 Unspecified atrial fibrillation: Secondary | ICD-10-CM | POA: Diagnosis not present

## 2019-12-22 DIAGNOSIS — I4892 Unspecified atrial flutter: Secondary | ICD-10-CM | POA: Diagnosis not present

## 2019-12-22 DIAGNOSIS — J4520 Mild intermittent asthma, uncomplicated: Secondary | ICD-10-CM | POA: Diagnosis not present

## 2019-12-22 DIAGNOSIS — J9601 Acute respiratory failure with hypoxia: Secondary | ICD-10-CM | POA: Diagnosis not present

## 2019-12-22 DIAGNOSIS — R41841 Cognitive communication deficit: Secondary | ICD-10-CM | POA: Diagnosis not present

## 2019-12-22 DIAGNOSIS — F330 Major depressive disorder, recurrent, mild: Secondary | ICD-10-CM | POA: Diagnosis not present

## 2019-12-22 DIAGNOSIS — J45901 Unspecified asthma with (acute) exacerbation: Secondary | ICD-10-CM | POA: Diagnosis not present

## 2019-12-22 DIAGNOSIS — J189 Pneumonia, unspecified organism: Secondary | ICD-10-CM | POA: Diagnosis not present

## 2019-12-22 LAB — CBC
HCT: 38.6 % (ref 36.0–46.0)
Hemoglobin: 12.5 g/dL (ref 12.0–15.0)
MCH: 28.9 pg (ref 26.0–34.0)
MCHC: 32.4 g/dL (ref 30.0–36.0)
MCV: 89.1 fL (ref 80.0–100.0)
Platelets: 300 10*3/uL (ref 150–400)
RBC: 4.33 MIL/uL (ref 3.87–5.11)
RDW: 12.4 % (ref 11.5–15.5)
WBC: 10.8 10*3/uL — ABNORMAL HIGH (ref 4.0–10.5)
nRBC: 0 % (ref 0.0–0.2)

## 2019-12-22 LAB — GLUCOSE, CAPILLARY
Glucose-Capillary: 205 mg/dL — ABNORMAL HIGH (ref 70–99)
Glucose-Capillary: 282 mg/dL — ABNORMAL HIGH (ref 70–99)
Glucose-Capillary: 199 mg/dL — ABNORMAL HIGH (ref 70–99)

## 2019-12-22 MED ORDER — AMIODARONE HCL 200 MG PO TABS: ORAL_TABLET | ORAL | 3 refills | Status: DC

## 2019-12-22 MED ORDER — APIXABAN 5 MG PO TABS: 5.0000 mg | ORAL_TABLET | Freq: Two times a day (BID) | ORAL | 3 refills | Status: DC

## 2019-12-22 NOTE — Progress Notes (Signed)
Called report to Lubertha Sayres at [facility].

## 2019-12-22 NOTE — Progress Notes (Signed)
Inpatient Diabetes Program Recommendations  AACE/ADA: New Consensus Statement on Inpatient Glycemic Control   Target Ranges:  Prepandial:   less than 140 mg/dL      Peak postprandial:   less than 180 mg/dL (1-2 hours)      Critically ill patients:  140 - 180 mg/dL   Results for Amy Hoffman, Amy Hoffman (MRN [id number]) as of 12/21/2019 09:03  Ref. Range 12/20/2019 08:04 12/20/2019 11:58 12/20/2019 16:17 12/20/2019 21:31 12/21/2019 07:58  Glucose-Capillary Latest Ref Range: 70 - 99 mg/dL 117 (H) 280 (H) 293 (H) 215 (H) 124 (H)  Results for Amy Hoffman, Amy Hoffman (MRN [id number]) as of 12/22/2019 10:43  Ref. Range 12/21/2019 07:58 12/21/2019 11:42 12/21/2019 16:20 12/21/2019 21:21 12/22/2019 09:29  Glucose-Capillary Latest Ref Range: 70 - 99 mg/dL 124 (H) 224 (H) 246 (H) 359 (H) 205 (H)   Review of Glycemic Control  Outpatient Diabetes medications:Amaryl 8 mg daily Current orders for Inpatient glycemic control:Lantus 7 units daily, Novolog 0-15 units TID with meals, Novolog 0-5 units QHS; Solumedrol 40 mg daily  Inpatient Diabetes Program Recommendations:  Insulin:If steroids are continued,please consider ordering Novolog 3 units TID with meals for meal coverage if patient eats at least 50% of meals.  Thanks, Tama Headings RN, MSN, BC-ADM Inpatient Diabetes Coordinator Team Pager [phone] (8a-5p)

## 2019-12-22 NOTE — Discharge Summary (Signed)
Physician Discharge Summary  Chandlar Eckelberry Shinn [id number] DOB: November 29, 1940 DOA: 12/16/2019  PCP: [name], NP  Admit date: 12/16/2019  Discharge date: 12/22/2019  Admitted From:Home  Disposition:  SNF  Recommendations for Outpatient Follow-up:  1. Follow up with PCP in 1-2 weeks 2. Follow-up BMP and CBC in 1 week 3. Follow-up with cardiology as scheduled on 4/26 4. Continue amiodarone as prescribed 5. Continue Eliquis as prescribed  Home Health:N/A  Equipment/Devices:None  Discharge Condition:Stable  CODE STATUS: DNR  Diet recommendation: Dysphagia 3  Brief/Interim Summary: As per H&P written by Dr. Denton Brick on 12/16/2019 79 y.o.femalewith medical history significant fordiabetes mellitus, hypertension, depression, dementia, breast cancer. Patient presented to the ED with complaints of difficulty breathing and wheezing over the past 2 days. Daughter present at bedside helps with most of the history.She reports patient has some mild diarrhea previously. No fevers, no chills, no vomiting. No lower extremity swelling. No chest pain. Denies dysuria. No dizziness. She is unaware of a fast heart rate.  Patient was in the ED yesterday with complaints of hyperglycemia but blood sugars were 215, also with increased sleepiness, and diarrhea. But patient left AMA,with her caregiver,per notes because"it was taking too long".  ED Course:O2 sats with ambulation down to 85%, with increasing respiratory distress. Heart rate initially mostly 80s to 109. But onmy evaluation patient's heart rate jumped up to the180s. Patient had just completed an hour-long nebulizer treatment. WBC 13.4. Chest x-ray suggestsmultifocal pneumonia. IV ceftriaxone given. Hospitalist to admit for asthma and pneumonia.  4/7: Patient was admitted with reactive airway disease/asthma exacerbation in the setting of community-acquired multifocal pneumonia and was also noted to have atrial  fibrillation/flutter with RVR. She is currently well controlled on oral amiodarone. She has been seen by palliative care today with recommendations to remain DNR after goals of care discussion. PT evaluation also with recommendations for SNF on discharge. Continue current antibiotics. Voiding trial today with removal of Foley catheter to see how this is tolerated. Anticipate discharge to SNF in 1-2 days.  4/8: Patient does not require Foley catheter at this time.  Repeat Covid testing has been ordered.  Antibiotics to be discontinued today.  Plan to transition to oral steroids today and start decreasing dosage based on some mild hyperglycemia that has been noted.  Anticipate transfer to SNF in a.m.  4/9: Patient is stable for discharge today and Covid testing has returned negative.  She will remain on amiodarone as prescribed for heart rate control.  She will continue on Eliquis as prescribed as well.  She has no further needs for steroids or breathing treatments at this time, and she has finished course of antibiotics over the last 5 days.  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  Discharge Instructions  Discharge Instructions    Diet - low sodium heart healthy   Complete by: As directed     Increase activity slowly   Complete by: As  directed      Allergies as of 12/22/2019      Reactions   Codeine    Zantac [ranitidine Hcl]       Medication List    TAKE these medications   amiodarone 200 MG tablet Commonly known as: PACERONE Take 400mg  twice daily for 5 days, then 200mg  twice daily for 14 days, then 200mg  daily thereafter   amLODipine 5 MG tablet Commonly known as: NORVASC Take 5 mg by mouth daily.   anastrozole 1 MG tablet Commonly known as: ARIMIDEX Take 1 mg by mouth daily.   apixaban 5 MG Tabs tablet Commonly known as: ELIQUIS Take 1 tablet (5 mg total) by mouth 2 (two) times daily.   donepezil 5 MG tablet Commonly known as: ARICEPT Take 5 mg by mouth at bedtime.   glimepiride 4 MG tablet Commonly known as: AMARYL Take 8 mg by mouth every morning.   risperiDONE 0.5 MG tablet Commonly known as: RISPERDAL Take 0.5 mg by mouth 2 (two) times daily.   simvastatin 40 MG tablet Commonly known as: ZOCOR Take 40 mg by mouth every evening.       Contact information for follow-up providers    Imogene Burn, PA-C Follow up on 01/08/2020.   Specialty: Cardiology Why: Cardiology Follow-up on 01/08/2020 at 1:00 PM.  Contact information: [place]  [phone]        Gray, Sarah E, NP Follow up in 1 week(s).   Specialty: Nurse Practitioner Contact information: [place] [address] [phone]            Contact information for after-discharge care    Destination    [facility] .   Service: Skilled Nursing Contact information: [address] [place] [phone]                 Allergies  Allergen Reactions  . Codeine   . Zantac [Ranitidine Hcl]     Consultations:  Cardiology  Palliative   Procedures/Studies: DG Chest Port 1 View  Result Date: 12/16/2019 CLINICAL DATA:  Short of breath, cough  and wheezing. EXAM: PORTABLE CHEST 1 VIEW COMPARISON:  02/04/2019 FINDINGS: Cardiac silhouette is normal in size. No mediastinal or hilar masses. Subtle hazy airspace opacities noted at the right lung base and lateral to the right hilum. Lungs otherwise clear. No convincing pleural effusion.  No pneumothorax. Skeletal structures are demineralized but grossly intact. IMPRESSION: Small subtle areas of hazy airspace opacity in the right mid and lower lung consistent with multifocal pneumonia in the proper clinical setting. Electronically Signed   By: Lajean Manes M.D.   On: 12/16/2019 13:21   ECHOCARDIOGRAM COMPLETE  Result Date: 12/17/2019    ECHOCARDIOGRAM REPORT   Patient Name:   KEILEN DYKE Edelman Date of Exam: 12/17/2019 Medical Rec #:  [id number]       Height:       64.0 in Accession #:    [id number]      Weight:       145.9 lb Date of Birth:  07-30-1941       BSA:          109.711 m Patient Age:    21 years        BP:           115/73 mmHg Patient Gender: F  HR:           126 bpm. Exam Location:  [facility] Procedure: 2D Echo Indications:    Atrial Fibrillation 427.31 / I48.91  History:        Patient has prior history of Echocardiogram examinations, most                 recent 01/10/2014. Arrythmias:Atrial Fibrillation; Risk                 Factors:Diabetes, Non-Smoker, Hypertension and Dyslipidemia.                 Major depressive disorder, Breast Cancer.  Sonographer:    Leavy Cella RDCS (AE) Referring Phys: [place]  1. Left ventricular ejection fraction, by estimation, is 50 to 55%. The left ventricle has low normal function. The left ventricle has no regional wall motion abnormalities. There is mild concentric left ventricular hypertrophy. Left ventricular diastolic function could not be evaluated.  2. Right ventricular systolic function is normal. The right ventricular size is normal. There is normal pulmonary artery systolic pressure. The estimated right  ventricular systolic pressure is [id number] mmHg.  3. Left atrial size was mildly dilated.  4. The mitral valve is normal in structure. Trivial mitral valve regurgitation.  5. The aortic valve is normal in structure. Aortic valve regurgitation is not visualized. No aortic stenosis is present.  6. The inferior vena cava is dilated in size with <50% respiratory variability, suggesting right atrial pressure of 15 mmHg. Comparison(s): Prior images unable to be directly viewed, comparison made by report only. No significant change from prior study. FINDINGS  Left Ventricle: Left ventricular ejection fraction, by estimation, is 50 to 55%. The left ventricle has low normal function. The left ventricle has no regional wall motion abnormalities. The left ventricular internal cavity size was normal in size. There is mild concentric left ventricular hypertrophy. Left ventricular diastolic function could not be evaluated due to atrial fibrillation. Left ventricular diastolic function could not be evaluated. Right Ventricle: The right ventricular size is normal. No increase in right ventricular wall thickness. Right ventricular systolic function is normal. There is normal pulmonary artery systolic pressure. The tricuspid regurgitant velocity is 1.90 m/s, and  with an assumed right atrial pressure of 15 mmHg, the estimated right ventricular systolic pressure is [id number] mmHg. Left Atrium: Left atrial size was mildly dilated. Right Atrium: Right atrial size was normal in size. Pericardium: There is no evidence of pericardial effusion. Mitral Valve: The mitral valve is normal in structure. Trivial mitral valve regurgitation. Tricuspid Valve: The tricuspid valve is normal in structure. Tricuspid valve regurgitation is trivial. Aortic Valve: The aortic valve is normal in structure. Aortic valve regurgitation is not visualized. No aortic stenosis is present. Pulmonic Valve: The pulmonic valve was not well visualized. Pulmonic valve  regurgitation is not visualized. Aorta: The aortic root is normal in size and structure. Venous: The inferior vena cava is dilated in size with less than 50% respiratory variability, suggesting right atrial pressure of 15 mmHg. IAS/Shunts: No atrial level shunt detected by color flow Doppler.  LEFT VENTRICLE PLAX 2D LVIDd:         3.25 cm  Diastology LVIDs:         2.73 cm  LV e' lateral:   8.25 cm/s LV PW:         1.36 cm  LV E/e' lateral: 11.7 LV IVS:        1.12 cm  LV  e' medial:    7.15 cm/s LVOT diam:     1.90 cm  LV E/e' medial:  13.5 LV SV:         42 LV SV Index:   24 LVOT Area:     2.84 cm  RIGHT VENTRICLE RV S prime:     10.10 cm/s TAPSE (M-mode): 1.8 cm LEFT ATRIUM             Index LA diam:        3.80 cm 2.22 cm/m LA Vol (A2C):   37.3 ml 21.80 ml/m LA Vol (A4C):   31.0 ml 18.12 ml/m LA Biplane Vol: 34.2 ml 19.99 ml/m  AORTIC VALVE LVOT Vmax:   79.60 cm/s LVOT Vmean:  55.200 cm/s LVOT VTI:    0.147 m  AORTA Ao Root diam: 2.50 cm MITRAL VALVE               TRICUSPID VALVE MV Area (PHT): 5.23 cm    TR Peak grad:   14.4 mmHg MV Decel Time: 145 msec    TR Vmax:        190.00 cm/s MV E velocity: 96.40 cm/s MV A velocity: 36.00 cm/s  SHUNTS MV E/A ratio:  2.68        Systemic VTI:  0.15 m                            Systemic Diam: 1.90 cm Mihai Croitoru MD Electronically signed by Sanda Klein MD Signature Date/Time: 12/17/2019/12:09:40 PM    Final      Discharge Exam: Vitals:   12/22/19 0603 12/22/19 0823  BP: (!) 173/71   Pulse: 76   Resp: 15   Temp: (!) 97.4 F (36.3 C)   SpO2: 95% 94%   Vitals:   12/21/19 2118 12/22/19 0128 12/22/19 0603 12/22/19 0823  BP: (!) 172/75 (!) 148/83 (!) 173/71   Pulse: 79 70 76   Resp: 16  15   Temp: 98.8 F (37.1 C)  (!) 97.4 F (36.3 C)   TempSrc: Oral  Axillary   SpO2: 97%  95% 94%  Weight:      Height:        General: Pt is alert, awake, not in acute distress Cardiovascular: RRR, S1/S2 +, no rubs, no gallops Respiratory: CTA bilaterally,  no wheezing, no rhonchi Abdominal: Soft, NT, ND, bowel sounds + Extremities: no edema, no cyanosis    The results of significant diagnostics from this hospitalization (including imaging, microbiology, ancillary and laboratory) are listed below for reference.     Microbiology: Recent Results (from the past 240 hour(s))  Respiratory Panel by RT PCR (Flu A&B, Covid) - Nasopharyngeal Swab     Status: None   Collection Time: 12/16/19 12:56 PM   Specimen: Nasopharyngeal Swab  Result Value Ref Range Status   SARS Coronavirus 2 by RT PCR NEGATIVE NEGATIVE Final    Comment: (NOTE) SARS-CoV-2 target nucleic acids are NOT DETECTED. The SARS-CoV-2 RNA is generally detectable in upper respiratoy specimens during the acute phase of infection. The lowest concentration of SARS-CoV-2 viral copies this assay can detect is 131 copies/mL. A negative result does not preclude SARS-Cov-2 infection and should not be used as the sole basis for treatment or other patient management decisions. A negative result may occur with  improper specimen collection/handling, submission of specimen other than nasopharyngeal swab, presence of viral mutation(s) within the areas targeted by this assay, and inadequate number  of viral copies (<131 copies/mL). A negative result must be combined with clinical observations, patient history, and epidemiological information. The expected result is Negative. Fact Sheet for Patients:  PinkCheek.be Fact Sheet for Healthcare Providers:  GravelBags.it This test is not yet ap proved or cleared by the [place] FDA and  has been authorized for detection and/or diagnosis of SARS-CoV-2 by FDA under an Emergency Use Authorization (EUA). This EUA will remain  in effect (meaning this test can be used) for the duration of the COVID-19 declaration under Section 564(b)(1) of the Act, 21 U.S.C. section 360bbb-3(b)(1), unless the  authorization is terminated or revoked sooner.    Influenza A by PCR NEGATIVE NEGATIVE Final   Influenza B by PCR NEGATIVE NEGATIVE Final    Comment: (NOTE) The Xpert Xpress SARS-CoV-2/FLU/RSV assay is intended as an aid in  the diagnosis of influenza from Nasopharyngeal swab specimens and  should not be used as a sole basis for treatment. Nasal washings and  aspirates are unacceptable for Xpert Xpress SARS-CoV-2/FLU/RSV  testing. Fact Sheet for Patients: PinkCheek.be Fact Sheet for Healthcare Providers: GravelBags.it This test is not yet approved or cleared by the [place] FDA and  has been authorized for detection and/or diagnosis of SARS-CoV-2 by  FDA under an Emergency Use Authorization (EUA). This EUA will remain  in effect (meaning this test can be used) for the duration of the  Covid-19 declaration under Section 564(b)(1) of the Act, 21  U.S.C. section 360bbb-3(b)(1), unless the authorization is  terminated or revoked. Performed at [facility], [address], [place], [place] [address]   MRSA PCR Screening     Status: None   Collection Time: 12/16/19  5:38 PM   Specimen: Nasal Mucosa; Nasopharyngeal  Result Value Ref Range Status   MRSA by PCR NEGATIVE NEGATIVE Final    Comment:        The GeneXpert MRSA Assay (FDA approved for NASAL specimens only), is one component of a comprehensive MRSA colonization surveillance program. It is not intended to diagnose MRSA infection nor to guide or monitor treatment for MRSA infections. Performed at [facility], [address], [place], [place] [address]   SARS CORONAVIRUS 2 (TAT 6-24 HRS) Nasopharyngeal Nasopharyngeal Swab     Status: None   Collection Time: 12/21/19  1:26 PM   Specimen: Nasopharyngeal Swab  Result Value Ref Range Status   SARS Coronavirus 2 NEGATIVE NEGATIVE Final    Comment: (NOTE) SARS-CoV-2 target nucleic acids are NOT DETECTED. The  SARS-CoV-2 RNA is generally detectable in upper and lower respiratory specimens during the acute phase of infection. Negative results do not preclude SARS-CoV-2 infection, do not rule out co-infections with other pathogens, and should not be used as the sole basis for treatment or other patient management decisions. Negative results must be combined with clinical observations, patient history, and epidemiological information. The expected result is Negative. Fact Sheet for Patients: SugarRoll.be Fact Sheet for Healthcare Providers: https://www.woods-mathews.com/ This test is not yet approved or cleared by the [place] FDA and  has been authorized for detection and/or diagnosis of SARS-CoV-2 by FDA under an Emergency Use Authorization (EUA). This EUA will remain  in effect (meaning this test can be used) for the duration of the COVID-19 declaration under Section 56 4(b)(1) of the Act, 21 U.S.C. section 360bbb-3(b)(1), unless the authorization is terminated or revoked sooner. Performed at [facility], [place] [address], [place], [place] [address]      Labs: BNP (last 3 results)  Recent Labs    12/16/19 1238  BNP 0000000*   Basic Metabolic Panel: Recent Labs  Lab 12/16/19 1238 12/17/19 0438 12/19/19 0357 12/21/19 0445  NA 139 139 140 138  K 3.4* 3.9 4.4 3.6  CL 101 107 107 100  CO2 27 23 24 31   GLUCOSE 225* 313* 296* 129*  BUN 18 28* 30* 18  CREATININE 0.70 0.86 0.73 0.58  CALCIUM 9.0 8.2* 8.5* 8.2*  MG 1.8  --  2.1 2.0   Liver Function Tests: Recent Labs  Lab 12/16/19 1238  AST 17  ALT 17  ALKPHOS 87  BILITOT 1.0  PROT 7.3  ALBUMIN 3.5   No results for input(s): LIPASE, AMYLASE in the last 168 hours. No results for input(s): AMMONIA in the last 168 hours. CBC: Recent Labs  Lab 12/16/19 1238 12/17/19 0438 12/20/19 0422 12/22/19 0518  WBC 13.4* 10.1 11.3* 10.8*  NEUTROABS 11.3*  --   --   --   HGB 12.6  10.8* 11.7* 12.5  HCT 39.1 34.4* 36.4 38.6  MCV 92.2 92.0 91.5 89.1  PLT 219 204 265 300   Cardiac Enzymes: No results for input(s): CKTOTAL, CKMB, CKMBINDEX, TROPONINI in the last 168 hours. BNP: Invalid input(s): POCBNP CBG: Recent Labs  Lab 12/21/19 1142 12/21/19 1620 12/21/19 2121 12/22/19 0929 12/22/19 1150  GLUCAP 224* 246* 359* 205* 282*   D-Dimer No results for input(s): DDIMER in the last 72 hours. Hgb A1c No results for input(s): HGBA1C in the last 72 hours. Lipid Profile No results for input(s): CHOL, HDL, LDLCALC, TRIG, CHOLHDL, LDLDIRECT in the last 72 hours. Thyroid function studies No results for input(s): TSH, T4TOTAL, T3FREE, THYROIDAB in the last 72 hours.  Invalid input(s): FREET3 Anemia work up No results for input(s): VITAMINB12, FOLATE, FERRITIN, TIBC, IRON, RETICCTPCT in the last 72 hours. Urinalysis    Component Value Date/Time   COLORURINE YELLOW 12/17/2019 0602   APPEARANCEUR CLEAR 12/17/2019 0602   LABSPEC 1.021 12/17/2019 0602   PHURINE 5.0 12/17/2019 0602   GLUCOSEU >=500 (A) 12/17/2019 0602   HGBUR NEGATIVE 12/17/2019 0602   BILIRUBINUR NEGATIVE 12/17/2019 0602   KETONESUR 5 (A) 12/17/2019 0602   PROTEINUR 100 (A) 12/17/2019 0602   UROBILINOGEN 0.2 01/23/2013 2317   NITRITE NEGATIVE 12/17/2019 0602   LEUKOCYTESUR NEGATIVE 12/17/2019 0602   Sepsis Labs Invalid input(s): PROCALCITONIN,  WBC,  LACTICIDVEN Microbiology Recent Results (from the past 240 hour(s))  Respiratory Panel by RT PCR (Flu A&B, Covid) - Nasopharyngeal Swab     Status: None   Collection Time: 12/16/19 12:56 PM   Specimen: Nasopharyngeal Swab  Result Value Ref Range Status   SARS Coronavirus 2 by RT PCR NEGATIVE NEGATIVE Final    Comment: (NOTE) SARS-CoV-2 target nucleic acids are NOT DETECTED. The SARS-CoV-2 RNA is generally detectable in upper respiratoy specimens during the acute phase of infection. The lowest concentration of SARS-CoV-2 viral copies this  assay can detect is 131 copies/mL. A negative result does not preclude SARS-Cov-2 infection and should not be used as the sole basis for treatment or other patient management decisions. A negative result may occur with  improper specimen collection/handling, submission of specimen other than nasopharyngeal swab, presence of viral mutation(s) within the areas targeted by this assay, and inadequate number of viral copies (<131 copies/mL). A negative result must be combined with clinical observations, patient history, and epidemiological information. The expected result is Negative. Fact Sheet for Patients:  PinkCheek.be Fact Sheet for Healthcare Providers:  GravelBags.it This test is  not yet ap proved or cleared by the [place] and  has been authorized for detection and/or diagnosis of SARS-CoV-2 by FDA under an Emergency Use Authorization (EUA). This EUA will remain  in effect (meaning this test can be used) for the duration of the COVID-19 declaration under Section 564(b)(1) of the Act, 21 U.S.C. section 360bbb-3(b)(1), unless the authorization is terminated or revoked sooner.    Influenza A by PCR NEGATIVE NEGATIVE Final   Influenza B by PCR NEGATIVE NEGATIVE Final    Comment: (NOTE) The Xpert Xpress SARS-CoV-2/FLU/RSV assay is intended as an aid in  the diagnosis of influenza from Nasopharyngeal swab specimens and  should not be used as a sole basis for treatment. Nasal washings and  aspirates are unacceptable for Xpert Xpress SARS-CoV-2/FLU/RSV  testing. Fact Sheet for Patients: PinkCheek.be Fact Sheet for Healthcare Providers: GravelBags.it This test is not yet approved or cleared by the [place] FDA and  has been authorized for detection and/or diagnosis of SARS-CoV-2 by  FDA under an Emergency Use Authorization (EUA). This EUA will remain  in  effect (meaning this test can be used) for the duration of the  Covid-19 declaration under Section 564(b)(1) of the Act, 21  U.S.C. section 360bbb-3(b)(1), unless the authorization is  terminated or revoked. Performed at [facility], [address], [place], [place] [address]   MRSA PCR Screening     Status: None   Collection Time: 12/16/19  5:38 PM   Specimen: Nasal Mucosa; Nasopharyngeal  Result Value Ref Range Status   MRSA by PCR NEGATIVE NEGATIVE Final    Comment:        The GeneXpert MRSA Assay (FDA approved for NASAL specimens only), is one component of a comprehensive MRSA colonization surveillance program. It is not intended to diagnose MRSA infection nor to guide or monitor treatment for MRSA infections. Performed at [facility], [address] [place], [place] [address]   SARS CORONAVIRUS 2 (TAT 6-24 HRS) Nasopharyngeal Nasopharyngeal Swab     Status: None   Collection Time: 12/21/19  1:26 PM   Specimen: Nasopharyngeal Swab  Result Value Ref Range Status   SARS Coronavirus 2 NEGATIVE NEGATIVE Final    Comment: (NOTE) SARS-CoV-2 target nucleic acids are NOT DETECTED. The SARS-CoV-2 RNA is generally detectable in upper and lower respiratory specimens during the acute phase of infection. Negative results do not preclude SARS-CoV-2 infection, do not rule out co-infections with other pathogens, and should not be used as the sole basis for treatment or other patient management decisions. Negative results must be combined with clinical observations, patient history, and epidemiological information. The expected result is Negative. Fact Sheet for Patients: SugarRoll.be Fact Sheet for Healthcare Providers: https://www.woods-mathews.com/ This test is not yet approved or cleared by the [place] FDA and  has been authorized for detection and/or diagnosis of SARS-CoV-2 by FDA under an Emergency Use Authorization (EUA). This EUA  will remain  in effect (meaning this test can be used) for the duration of the COVID-19 declaration under Section 56 4(b)(1) of the Act, 21 U.S.C. section 360bbb-3(b)(1), unless the authorization is terminated or revoked sooner. Performed at [facility], [place] [address], [place], [place] [address]      Time coordinating discharge: 35 minutes  SIGNED:   Rodena Goldmann, DO [facility] 12/22/2019, 3:23 PM  If 7PM-7AM, please contact night-coverage [email or web address]

## 2019-12-22 NOTE — Progress Notes (Signed)
  Speech Language Pathology Treatment: Dysphagia  Patient Details Name: Amy Hoffman MRN: [id number] DOB: 1941/06/14 Today's Date: 12/22/2019 Time: 3810-1751 SLP Time Calculation (min) (ACUTE ONLY): 17 min  Assessment / Plan / Recommendation Clinical Impression  SLP provided ongoing diagnostic dysphagia therapy targeting trials of recently upgraded consistency; Pt consumed trials of thin liquids independent of cues without overt s/sx of aspiration. SLP reviewed aspiration precautions with patient. RN reports Pt requested meds to be adminsitered whole in applesauce and that she required multiple subsequent bites of puree to facilitate full clearance of meds; Recommend continue med administration whole with puree (rather than whole with liquids) followed by liquid wash to facilitate and ensure oral clearance of bolus. Recommend continue with D3/mech soft and thin liquids; there are no further ST needs while in acute. Pt may benefit from ST f/u at d/c location to ensure diet tolerance and usage of aspiration precautions. ST will sign off, thank you for this referral.   HPI HPI: Amy Hoffman is a 79 y.o. female with medical history significant for diabetes mellitus, hypertension, depression, dementia, breast cancer.      SLP Plan  All goals met       Recommendations  Diet recommendations: Dysphagia 3 (mechanical soft);Thin liquid Liquids provided via: Cup;Straw Medication Administration: Whole meds with puree Supervision: Patient able to self feed;Intermittent supervision to cue for compensatory strategies Compensations: Minimize environmental distractions;Slow rate;Small sips/bites;Follow solids with liquid Postural Changes and/or Swallow Maneuvers: Seated upright 90 degrees;Upright 30-60 min after meal                Oral Care Recommendations: Oral care BID Follow up Recommendations: 24 hour supervision/assistance SLP Visit Diagnosis: Dysphagia, unspecified (R13.10) Plan: All  goals met       Amy Hoffman, CCC-SLP Speech Language Pathologist   Wende Bushy 12/22/2019, 11:08 AM

## 2019-12-22 NOTE — Plan of Care (Signed)
  Problem: Education: Goal: Knowledge of General Education information will improve Description: Including pain rating scale, medication(s)/side effects and non-pharmacologic comfort measures 12/22/2019 1739 by Santa Lighter, RN Outcome: Adequate for Discharge 12/22/2019 0943 by Santa Lighter, RN Outcome: Progressing   Problem: Health Behavior/Discharge Planning: Goal: Ability to manage health-related needs will improve Outcome: Adequate for Discharge   Problem: Clinical Measurements: Goal: Ability to maintain clinical measurements within normal limits will improve Outcome: Adequate for Discharge Goal: Will remain free from infection 12/22/2019 1739 by Santa Lighter, RN Outcome: Adequate for Discharge 12/22/2019 0943 by Santa Lighter, RN Outcome: Progressing Goal: Diagnostic test results will improve Outcome: Adequate for Discharge Goal: Respiratory complications will improve Outcome: Adequate for Discharge Goal: Cardiovascular complication will be avoided Outcome: Adequate for Discharge   Problem: Activity: Goal: Risk for activity intolerance will decrease 12/22/2019 1739 by Santa Lighter, RN Outcome: Adequate for Discharge 12/22/2019 [phone] by Santa Lighter, RN Outcome: Progressing   Problem: Nutrition: Goal: Adequate nutrition will be maintained Outcome: Adequate for Discharge   Problem: Coping: Goal: Level of anxiety will decrease Outcome: Adequate for Discharge   Problem: Elimination: Goal: Will not experience complications related to bowel motility Outcome: Adequate for Discharge Goal: Will not experience complications related to urinary retention Outcome: Adequate for Discharge   Problem: Pain Managment: Goal: General experience of comfort will improve Outcome: Adequate for Discharge   Problem: Safety: Goal: Ability to remain free from injury will improve Outcome: Adequate for Discharge   Problem: Skin Integrity: Goal: Risk for impaired skin integrity will  decrease Outcome: Adequate for Discharge   Problem: Education: Goal: Knowledge of disease or condition will improve Outcome: Adequate for Discharge Goal: Understanding of medication regimen will improve Outcome: Adequate for Discharge Goal: Individualized Educational Video(s) Outcome: Adequate for Discharge   Problem: Activity: Goal: Ability to tolerate increased activity will improve Outcome: Adequate for Discharge   Problem: Cardiac: Goal: Ability to achieve and maintain adequate cardiopulmonary perfusion will improve Outcome: Adequate for Discharge   Problem: Health Behavior/Discharge Planning: Goal: Ability to safely manage health-related needs after discharge will improve Outcome: Adequate for Discharge

## 2019-12-22 NOTE — Plan of Care (Signed)
  Problem: Education: Goal: Knowledge of General Education information will improve Description: Including pain rating scale, medication(s)/side effects and non-pharmacologic comfort measures Outcome: Progressing   Problem: Clinical Measurements: Goal: Will remain free from infection Outcome: Progressing   Problem: Activity: Goal: Risk for activity intolerance will decrease Outcome: Progressing   

## 2019-12-22 NOTE — TOC Transition Note (Signed)
Transition of Care [facility]) - CM/SW Discharge Note   Patient Details  Name: Amy Hoffman MRN: [id number] Date of Birth: Mar 06, 1941  Transition of Care [facility]) CM/SW Contact:  Shade Flood, LCSW Phone Number: 12/22/2019, 3:36 PM   Clinical Narrative:     Received PASRR number for patient today. She is medically stable for dc. Mardene Celeste at [facility] states that they can take her today. DC clinical sent electronically. RN can call report to [phone]. Pt going to room [place].  Updated pt's dtr, [name], by phone. She is in agreement with the plan. EMS form printed to the floor.   There are no other TOC needs for dc.  Final next level of care: Skilled Nursing Facility Barriers to Discharge: Barriers Resolved   Patient Goals and CMS Choice Patient states their goals for this hospitalization and ongoing recovery are:: Discharge to SNF for rehab CMS Medicare.gov Compare Post Acute Care list provided to:: Patient Represenative (must comment)([name] Carron Brazen (daughter) [facility]: [phone]) Choice offered to / list presented to : Adult Children  Discharge Placement PASRR number recieved: 12/22/19            Patient chooses bed at: Other - please specify in the comment section below:([facility]) Patient to be transferred to facility by: EMS Name of family member notified: [name] Patient and family notified of of transfer: 12/22/19  Discharge Plan and Services     Post Acute Care Choice: [place]                               Social Determinants of Health (SDOH) Interventions     Readmission Risk Interventions No flowsheet data found.

## 2019-12-26 ENCOUNTER — Ambulatory Visit (INDEPENDENT_AMBULATORY_CARE_PROVIDER_SITE_OTHER): Payer: Medicare Other | Admitting: Nurse Practitioner

## 2020-01-03 NOTE — Progress Notes (Signed)
Cardiology Office Note    Date:  01/08/2020   ID:  Amy Hoffman, DOB 1940/11/26, MRN [id number]  PCP:  [name], NP  Cardiologist: Carlyle Dolly, MD EPS: None  No chief complaint on file.   History of Present Illness:  Amy Hoffman is a 79 y.o. female with history of hypertension, DM, anxiety depression, dementia, breast CA  Patient came to the emergency room with atrial flutter with RVR in the setting of pneumonia and asthma exacerbation, failed rate control with diltiazem drip and started on amiodarone drip and Eliquis for stroke prevention. She converted to normal sinus rhythm next day and was discharged home on amiodarone load.  Echo 12/2019 LVEF 50 to 55%.  Patient comes in today from nursing facility accompanied by her daughter. Denies cardiac complaints. They hope she will be able to come home when she gets stronger. No chest pain, palpitations, dyspnea, dizziness or presyncope  Past Medical History:  Diagnosis Date  . Abnormal uterine and vaginal bleeding, unspecified   . Allergic rhinitis due to pollen   . Anxiety   . Asthma   . Cancer [facility])    breast cancer - right  . Dementia ([place])   . Depression   . Depression   . Diabetes mellitus without complication ([place])   . Essential (primary) hypertension   . Hyperlipidemia, unspecified   . Hypertension   . Major depressive disorder, single episode, unspecified   . Obesity, unspecified   . Pain in joint involving shoulder region   . Reflux esophagitis   . Trigger finger, acquired   . Type 2 diabetes mellitus with hyperglycemia ([place])   . Unspecified asthma with (acute) exacerbation   . Unspecified asthma, uncomplicated   . 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 ([place])   . Unspecified osteoarthritis, unspecified site     Past Surgical History:  Procedure Laterality Date  . CHOLECYSTECTOMY    . MASTECTOMY Right   . RE-EXCISION OF BREAST LUMPECTOMY      Current Medications: Current Meds    Medication Sig  . amiodarone (PACERONE) 200 MG tablet Take 451m twice daily for 5 days, then 2063mtwice daily for 14 days, then 20026maily thereafter  . anastrozole (ARIMIDEX) 1 MG tablet Take 1 mg by mouth daily.  . aMarland Kitchenixaban (ELIQUIS) 5 MG TABS tablet Take 1 tablet (5 mg total) by mouth 2 (two) times daily.  . dMarland Kitchennepezil (ARICEPT) 5 MG tablet Take 5 mg by mouth at bedtime.  . gMarland Kitchenimepiride (AMARYL) 4 MG tablet Take 8 mg by mouth daily.   . risperiDONE (RISPERDAL) 0.5 MG tablet Take 0.5 mg by mouth 2 (two) times daily.  . simvastatin (ZOCOR) 40 MG tablet Take 40 mg by mouth every evening.  . [DISCONTINUED] amLODipine (NORVASC) 5 MG tablet Take 5 mg by mouth daily.     Allergies:   Codeine and Zantac [ranitidine hcl]   Social History   Socioeconomic History  . Marital status: Married    Spouse name: Not on file  . Number of children: Not on file  . Years of education: Not on file  . Highest education level: Not on file  Occupational History  . Not on file  Tobacco Use  . Smoking status: Never Smoker  . Smokeless tobacco: Never Used  Substance and Sexual Activity  . Alcohol use: No  . Drug use: No  . Sexual activity: Not on file  Other Topics Concern  . Not on file  Social History Narrative  .  Not on file   Social Determinants of Health   Financial Resource Strain:   . Difficulty of Paying Living Expenses:   Food Insecurity:   . Worried About Charity fundraiser in the Last Year:   . Arboriculturist in the Last Year:   Transportation Needs:   . Film/video editor (Medical):   Marland Kitchen Lack of Transportation (Non-Medical):   Physical Activity:   . Days of Exercise per Week:   . Minutes of Exercise per Session:   Stress:   . Feeling of Stress :   Social Connections:   . Frequency of Communication with Friends and Family:   . Frequency of Social Gatherings with Friends and Family:   . Attends Religious Services:   . Active Member of Clubs or Organizations:   . Attends  Archivist Meetings:   Marland Kitchen Marital Status:      Family History:  The patient's Family history is unknown by patient.   ROS:   Please see the history of present illness.    ROS All other systems reviewed and are negative.   PHYSICAL EXAM:   VS:  BP (!) 142/56   Pulse (!) 56   Temp 97.6 F (36.4 C) (Temporal)   Ht 5' 4" (1.626 m)   Wt 134 lb (60.8 kg)   SpO2 97%   BMI 23.00 kg/m   Physical Exam  GEN: Well nourished, well developed, in no acute distress  Neck: no JVD, carotid bruits, or masses Cardiac:RRR; no murmurs, rubs, or gallops  Respiratory:  clear to auscultation bilaterally, normal work of breathing GI: soft, nontender, nondistended, + BS Ext: without cyanosis, clubbing, or edema, Good distal pulses bilaterally Neuro:  Alert and Oriented x 3 Psych: euthymic mood, full affect  Wt Readings from Last 3 Encounters:  01/08/20 134 lb (60.8 kg)  12/19/19 147 lb 14.9 oz (67.1 kg)  12/15/19 130 lb (59 kg)      Studies/Labs Reviewed:   EKG:  EKG is  ordered today.  The ekg ordered today demonstrates normal sinus rhythm a lot of artifact otherwise normal  Recent Labs: 12/16/2019: ALT 17; B Natriuretic Peptide 196.0; TSH 1.774 12/21/2019: BUN 18; Creatinine, Ser 0.58; Magnesium 2.0; Potassium 3.6; Sodium 138 12/22/2019: Hemoglobin 12.5; Platelets 300   Lipid Panel    Component Value Date/Time   CHOL 154 11/29/2018 0000   TRIG 41 11/29/2018 0000   HDL 76 (A) 11/29/2018 0000   LDLCALC 66 11/29/2018 0000    Additional studies/ records that were reviewed today include:        Echo 12/17/19 IMPRESSIONS     1. Left ventricular ejection fraction, by estimation, is 50 to 55%. The  left ventricle has low normal function. The left ventricle has no regional  wall motion abnormalities. There is mild concentric left ventricular  hypertrophy. Left ventricular  diastolic function could not be evaluated.   2. Right ventricular systolic function is normal. The right  ventricular  size is normal. There is normal pulmonary artery systolic pressure. The  estimated right ventricular systolic pressure is 97.6 mmHg.   3. Left atrial size was mildly dilated.   4. The mitral valve is normal in structure. Trivial mitral valve  regurgitation.   5. The aortic valve is normal in structure. Aortic valve regurgitation is  not visualized. No aortic stenosis is present.   6. The inferior vena cava is dilated in size with <50% respiratory  variability, suggesting right atrial pressure of 15  mmHg.   Comparison(s): Prior images unable to be directly viewed, comparison made  by report only. No significant change from prior study.   FINDINGS   Left Ventricle: Left ventricular ejection fraction, by estimation, is 50  to 55%. The left ventricle has low normal function. The left ventricle has  no regional wall motion abnormalities. The left ventricular internal  cavity size was normal in size.  There is mild concentric left ventricular hypertrophy. Left ventricular  diastolic function could not be evaluated due to atrial fibrillation. Left  ventricular diastolic function could not be evaluated.   Right Ventricle: The right ventricular size is normal. No increase in  right ventricular wall thickness. Right ventricular systolic function is  normal. There is normal pulmonary artery systolic pressure. The tricuspid  regurgitant velocity is 1.90 m/s, and   with an assumed right atrial pressure of 15 mmHg, the estimated right  ventricular systolic pressure is 13.0 mmHg.   Left Atrium: Left atrial size was mildly dilated.   Right Atrium: Right atrial size was normal in size.   Pericardium: There is no evidence of pericardial effusion.   Mitral Valve: The mitral valve is normal in structure. Trivial mitral  valve regurgitation.   Tricuspid Valve: The tricuspid valve is normal in structure. Tricuspid  valve regurgitation is trivial.   Aortic Valve: The aortic valve is  normal in structure. Aortic valve  regurgitation is not visualized. No aortic stenosis is present.   Pulmonic Valve: The pulmonic valve was not well visualized. Pulmonic valve  regurgitation is not visualized.   Aorta: The aortic root is normal in size and structure.   Venous: The inferior vena cava is dilated in size with less than 50%  respiratory variability, suggesting right atrial pressure of 15 mmHg.   IAS/Shunts: No atrial level shunt detected by color flow Doppler.      ECHO 01/10/14 Study Conclusions   - Left ventricle: The cavity size was normal. Wall thickness    was increased in a pattern of mild LVH. Systolic function    was normal. The estimated ejection fraction was in the    range of 50% to 55%. Wall motion was normal; there were no    regional wall motion abnormalities. Doppler parameters are    consistent with abnormal left ventricular relaxation    (grade 1 diastolic dysfunction). Doppler parameters are    consistent with elevated mean left atrial filling    pressure.  - Left atrium: The atrium was mildly dilated.  - Tricuspid valve: Mild regurgitation.  - Pulmonary arteries: PA peak pressure: 19m Hg (S). Mildly    elevated pulmonary pressures.        ASSESSMENT:    1. Atrial flutter, unspecified type ([place]   2. Reactive airway disease without complication, unspecified asthma severity, unspecified whether persistent   3. Hyperlipidemia, unspecified hyperlipidemia type   4. Type 2 diabetes mellitus with hyperglycemia, unspecified whether long term insulin use ([place]   5. Dementia without behavioral disturbance, unspecified dementia type ([place]      PLAN:  In order of problems listed above:  Atrial flutter with RVR in the setting of pneumonia converted to normal sinus rhythm with amiodarone, started on Eliquis for stroke prevention.  2D echo 12/2019 LVEF 50 to 55%.  Patient seen in nursing facility for rehab.  Is in normal sinus rhythm today on amiodarone  load.  Will check CBC and be met today.  Follow-up with Dr. BHarl Bowiein 6 weeks.  Reactive airway disease/asthma  exacerbation and pneumonia  Hyperlipidemia on Zocor  Dementia  DM type II managed by PCP  Medication Adjustments/Labs and Tests Ordered: Current medicines are reviewed at length with the patient today.  Concerns regarding medicines are outlined above.  Medication changes, Labs and Tests ordered today are listed in the Patient Instructions below. There are no Patient Instructions on file for this visit.   Signed, Ermalinda Barrios, PA-C  01/08/2020 [facility] [place], [place], [place]  [address] Phone: [phone]; Fax: [phone]

## 2020-01-05 ENCOUNTER — Other Ambulatory Visit: Payer: Self-pay | Admitting: *Deleted

## 2020-01-05 NOTE — Patient Outreach (Signed)
Late entry 01/04/20  Screened for potential [facility] Care Management needs as a benefit of  [employer] Medicare.  Mrs. Deguire is currently receiving skilled therapy at [facility].  Writer attended telephonic interdisciplinary team meeting to assess for disposition needs and transition plan for resident.   Facility reports transition plan is to return home. Sitter list and agency list provided to family.  Will continue to follow for transition needs. Will plan outreach to family accordingly.   Marthenia Rolling, MSN-Ed, RN,BSN [place] Acute Care Coordinator [phone] [facility]) [phone]  (Toll free office)

## 2020-01-08 ENCOUNTER — Ambulatory Visit (INDEPENDENT_AMBULATORY_CARE_PROVIDER_SITE_OTHER): Payer: Medicare Other | Admitting: Physician Assistant

## 2020-01-08 ENCOUNTER — Encounter: Payer: Self-pay | Admitting: Physician Assistant

## 2020-01-08 ENCOUNTER — Other Ambulatory Visit (HOSPITAL_COMMUNITY)
Admission: RE | Admit: 2020-01-08 | Discharge: 2020-01-08 | Disposition: A | Discharge status: Home / Self Care | Payer: No Typology Code available for payment source | Source: Ambulatory Visit | Attending: Physician Assistant | Admitting: Physician Assistant

## 2020-01-08 VITALS — BP 142/56 | HR 56 | Temp 97.6°F | Ht 64.0 in | Wt 134.0 lb

## 2020-01-08 DIAGNOSIS — J45909 Unspecified asthma, uncomplicated: Secondary | ICD-10-CM

## 2020-01-08 DIAGNOSIS — I4892 Unspecified atrial flutter: Secondary | ICD-10-CM | POA: Insufficient documentation

## 2020-01-08 DIAGNOSIS — E785 Hyperlipidemia, unspecified: Secondary | ICD-10-CM | POA: Insufficient documentation

## 2020-01-08 DIAGNOSIS — E1165 Type 2 diabetes mellitus with hyperglycemia: Secondary | ICD-10-CM

## 2020-01-08 DIAGNOSIS — F039 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: Secondary | ICD-10-CM | POA: Diagnosis not present

## 2020-01-08 LAB — CBC
HCT: 39.2 % (ref 36.0–46.0)
Hemoglobin: 12.5 g/dL (ref 12.0–15.0)
MCH: 29.3 pg (ref 26.0–34.0)
MCHC: 31.9 g/dL (ref 30.0–36.0)
MCV: 91.8 fL (ref 80.0–100.0)
Platelets: 198 10*3/uL (ref 150–400)
RBC: 4.27 MIL/uL (ref 3.87–5.11)
RDW: 13.3 % (ref 11.5–15.5)
WBC: 6 10*3/uL (ref 4.0–10.5)
nRBC: 0 % (ref 0.0–0.2)

## 2020-01-08 LAB — BASIC METABOLIC PANEL
Anion gap: 9 (ref 5–15)
BUN: 23 mg/dL (ref 8–23)
CO2: 27 mmol/L (ref 22–32)
Calcium: 8.5 mg/dL — ABNORMAL LOW (ref 8.9–10.3)
Chloride: 99 mmol/L (ref 98–111)
Creatinine, Ser: 1 mg/dL (ref 0.44–1.00)
GFR calc Af Amer: >60 mL/min (ref >60)
GFR calc non Af Amer: 54 mL/min — ABNORMAL LOW (ref >60)
Glucose, Bld: 258 mg/dL — ABNORMAL HIGH (ref 70–99)
Potassium: 4.2 mmol/L (ref 3.5–5.1)
Sodium: 135 mmol/L (ref 135–145)

## 2020-01-08 NOTE — Patient Instructions (Signed)
Medication Instructions:  Your physician recommends that you continue on your current medications as directed. Please refer to the Current Medication list given to you today.  *If you need a refill on your cardiac medications before your next appointment, please call your pharmacy*   Lab Work: CBC, BMET Today  If you have labs (blood work) drawn today and your tests are completely normal, you will receive your results only by: Marland Kitchen MyChart Message (if you have MyChart) OR . A paper copy in the mail If you have any lab test that is abnormal or we need to change your treatment, we will call you to review the results.   Testing/Procedures: None   Follow-Up: At [facility], you and your health needs are our priority.  As part of our continuing mission to provide you with exceptional heart care, we have created designated Provider Care Teams.  These Care Teams include your primary Cardiologist (physician) and Advanced Practice Providers (APPs -  Physician Assistants and Nurse Practitioners) who all work together to provide you with the care you need, when you need it.  We recommend signing up for the patient portal called "MyChart".  Sign up information is provided on this After Visit Summary.  MyChart is used to connect with patients for Virtual Visits (Telemedicine).  Patients are able to view lab/test results, encounter notes, upcoming appointments, etc.  Non-urgent messages can be sent to your provider as well.   To learn more about what you can do with MyChart, go to [email or web address].    Your next appointment:   6 week(s)  The format for your next appointment:   In Person  Provider:   Carlyle Dolly, MD

## 2020-01-08 NOTE — Addendum Note (Signed)
Addended by: Carylon Perches on: 01/08/2020 05:00 PM   Modules accepted: Orders

## 2020-01-11 ENCOUNTER — Telehealth (INDEPENDENT_AMBULATORY_CARE_PROVIDER_SITE_OTHER): Payer: Self-pay | Admitting: Nurse Practitioner

## 2020-01-11 NOTE — Telephone Encounter (Signed)
Will you call this patient or her caregiver, she was supposed to be a new patient established at this office, but I think she was hospitalized when her appointment was scheduled.  Thus, she was never seen here.  I am listed as her primary care provider, but have not yet seen her, and if she would still like to be established here we need to get her scheduled for a new patient appointment.  If she is being seen by different primary care provider will you ask the name of the provider and update her chart with the correct provider?  Thank you.

## 2020-01-17 ENCOUNTER — Telehealth (INDEPENDENT_AMBULATORY_CARE_PROVIDER_SITE_OTHER): Payer: Self-pay

## 2020-01-17 NOTE — Telephone Encounter (Signed)
Hey Amy Hoffman from [facility] has gotten orders from the hospital that [name] is going to be needing home health care for Physical Therapy and she doesn't have an appointment here until May 24 at 3:00 and they are asking if we can sign the PT order, Please advise?

## 2020-01-17 NOTE — Telephone Encounter (Signed)
Vaughan Basta is aware and understands and will check with Dr. Harl Bowie

## 2020-01-17 NOTE — Telephone Encounter (Signed)
Dr. Darnell Level.  Please advise regarding whether or not we can sign physical therapy orders to that she can get at home physical therapy from [facility] home [facility].  She has not yet established care at this office, she did have an appointment in the past but I think this was canceled due to her being in the hospital.  Apparently she has an appointment later on this month to establish care.  Per chart review it looks like Dr. Harl Bowie with cardiology is currently listed as her PCP, should we have him sign the physical therapy order until we have had her establish in our practice or are you comfortable signing this order?

## 2020-01-17 NOTE — Telephone Encounter (Signed)
Amy Hoffman, I did discuss this with Dr. Anastasio Champion, and because the patient has not been seen by [place] yet we are unable to sign any orders for her.  If you could let [name] know that the patient is being treated by Dr. Harl Bowie, and that he might be willing to sign the orders that would be great.  Once she has established care with [place] and she has had her first visit we can certainly take over these concerns, however without first evaluating the patient it is unwise and can be unsafe to make recommendations regarding her care. Thank you.

## 2020-01-17 NOTE — Telephone Encounter (Signed)
We can definitely not sign any papers if we have not seen the patient..Period.

## 2020-01-18 DIAGNOSIS — I482 Chronic atrial fibrillation, unspecified: Secondary | ICD-10-CM | POA: Diagnosis not present

## 2020-01-18 DIAGNOSIS — Z853 Personal history of malignant neoplasm of breast: Secondary | ICD-10-CM | POA: Diagnosis not present

## 2020-01-18 DIAGNOSIS — F329 Major depressive disorder, single episode, unspecified: Secondary | ICD-10-CM | POA: Diagnosis not present

## 2020-01-18 DIAGNOSIS — J45901 Unspecified asthma with (acute) exacerbation: Secondary | ICD-10-CM | POA: Diagnosis not present

## 2020-01-18 DIAGNOSIS — F039 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: Secondary | ICD-10-CM | POA: Diagnosis not present

## 2020-01-18 DIAGNOSIS — E119 Type 2 diabetes mellitus without complications: Secondary | ICD-10-CM | POA: Diagnosis not present

## 2020-01-18 DIAGNOSIS — J9601 Acute respiratory failure with hypoxia: Secondary | ICD-10-CM | POA: Diagnosis not present

## 2020-01-18 DIAGNOSIS — J189 Pneumonia, unspecified organism: Secondary | ICD-10-CM | POA: Diagnosis not present

## 2020-01-23 ENCOUNTER — Other Ambulatory Visit: Payer: Self-pay | Admitting: *Deleted

## 2020-01-23 DIAGNOSIS — J45901 Unspecified asthma with (acute) exacerbation: Secondary | ICD-10-CM | POA: Diagnosis not present

## 2020-01-23 DIAGNOSIS — J9601 Acute respiratory failure with hypoxia: Secondary | ICD-10-CM | POA: Diagnosis not present

## 2020-01-23 DIAGNOSIS — F039 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: Secondary | ICD-10-CM | POA: Diagnosis not present

## 2020-01-23 DIAGNOSIS — J189 Pneumonia, unspecified organism: Secondary | ICD-10-CM | POA: Diagnosis not present

## 2020-01-23 DIAGNOSIS — I482 Chronic atrial fibrillation, unspecified: Secondary | ICD-10-CM | POA: Diagnosis not present

## 2020-01-23 DIAGNOSIS — E119 Type 2 diabetes mellitus without complications: Secondary | ICD-10-CM | POA: Diagnosis not present

## 2020-01-23 NOTE — Patient Outreach (Signed)
Member screened for potential [facility] Care Management needs as a benefit of [place] Medicare.  Verified in Patient Amy Hoffman that Mrs. Logsdon transitioned to home from [facility] on 01/17/2020.  Facility SW previously indicated member's daughter Denny Peon is primary contact at [phone].  Telephone call made to Denny Peon (daughter). No answer. HIPAA compliant voicemail message left requesting a return call.   Marthenia Rolling, MSN-Ed, RN,BSN [place] Acute Care Coordinator [phone] [facility]) [phone]  (Toll free office)

## 2020-01-24 ENCOUNTER — Other Ambulatory Visit: Payer: Self-pay | Admitting: Family Medicine

## 2020-01-24 DIAGNOSIS — E119 Type 2 diabetes mellitus without complications: Secondary | ICD-10-CM | POA: Diagnosis not present

## 2020-01-24 DIAGNOSIS — I482 Chronic atrial fibrillation, unspecified: Secondary | ICD-10-CM | POA: Diagnosis not present

## 2020-01-24 DIAGNOSIS — J189 Pneumonia, unspecified organism: Secondary | ICD-10-CM | POA: Diagnosis not present

## 2020-01-24 DIAGNOSIS — F039 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: Secondary | ICD-10-CM | POA: Diagnosis not present

## 2020-01-24 DIAGNOSIS — J45901 Unspecified asthma with (acute) exacerbation: Secondary | ICD-10-CM | POA: Diagnosis not present

## 2020-01-24 DIAGNOSIS — J9601 Acute respiratory failure with hypoxia: Secondary | ICD-10-CM | POA: Diagnosis not present

## 2020-01-30 DIAGNOSIS — J45901 Unspecified asthma with (acute) exacerbation: Secondary | ICD-10-CM | POA: Diagnosis not present

## 2020-01-30 DIAGNOSIS — E119 Type 2 diabetes mellitus without complications: Secondary | ICD-10-CM | POA: Diagnosis not present

## 2020-01-30 DIAGNOSIS — J189 Pneumonia, unspecified organism: Secondary | ICD-10-CM | POA: Diagnosis not present

## 2020-01-30 DIAGNOSIS — J9601 Acute respiratory failure with hypoxia: Secondary | ICD-10-CM | POA: Diagnosis not present

## 2020-01-30 DIAGNOSIS — F039 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: Secondary | ICD-10-CM | POA: Diagnosis not present

## 2020-01-30 DIAGNOSIS — I482 Chronic atrial fibrillation, unspecified: Secondary | ICD-10-CM | POA: Diagnosis not present

## 2020-01-31 DIAGNOSIS — I482 Chronic atrial fibrillation, unspecified: Secondary | ICD-10-CM | POA: Diagnosis not present

## 2020-01-31 DIAGNOSIS — J9601 Acute respiratory failure with hypoxia: Secondary | ICD-10-CM | POA: Diagnosis not present

## 2020-01-31 DIAGNOSIS — E119 Type 2 diabetes mellitus without complications: Secondary | ICD-10-CM | POA: Diagnosis not present

## 2020-01-31 DIAGNOSIS — J189 Pneumonia, unspecified organism: Secondary | ICD-10-CM | POA: Diagnosis not present

## 2020-01-31 DIAGNOSIS — J45901 Unspecified asthma with (acute) exacerbation: Secondary | ICD-10-CM | POA: Diagnosis not present

## 2020-01-31 DIAGNOSIS — F039 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: Secondary | ICD-10-CM | POA: Diagnosis not present

## 2020-02-05 ENCOUNTER — Inpatient Hospital Stay (HOSPITAL_COMMUNITY)
Admission: EM | Admit: 2020-02-05 | Discharge: 2020-02-14 | DRG: 193 | Disposition: A | Discharge status: Skilled Nursing Facility | Payer: Medicare Other | Attending: Family Medicine | Admitting: Family Medicine

## 2020-02-05 ENCOUNTER — Ambulatory Visit (INDEPENDENT_AMBULATORY_CARE_PROVIDER_SITE_OTHER): Payer: Medicare Other | Admitting: Nurse Practitioner

## 2020-02-05 ENCOUNTER — Emergency Department (HOSPITAL_COMMUNITY): Payer: Medicare Other

## 2020-02-05 ENCOUNTER — Encounter (HOSPITAL_COMMUNITY): Payer: Self-pay

## 2020-02-05 ENCOUNTER — Other Ambulatory Visit: Payer: Self-pay

## 2020-02-05 DIAGNOSIS — N181 Chronic kidney disease, stage 1: Secondary | ICD-10-CM | POA: Diagnosis present

## 2020-02-05 DIAGNOSIS — R296 Repeated falls: Secondary | ICD-10-CM | POA: Diagnosis not present

## 2020-02-05 DIAGNOSIS — F03918 Unspecified dementia, unspecified severity, with other behavioral disturbance: Secondary | ICD-10-CM | POA: Diagnosis present

## 2020-02-05 DIAGNOSIS — F0391 Unspecified dementia with behavioral disturbance: Secondary | ICD-10-CM | POA: Diagnosis not present

## 2020-02-05 DIAGNOSIS — E1122 Type 2 diabetes mellitus with diabetic chronic kidney disease: Secondary | ICD-10-CM | POA: Diagnosis present

## 2020-02-05 DIAGNOSIS — Z79899 Other long term (current) drug therapy: Secondary | ICD-10-CM

## 2020-02-05 DIAGNOSIS — F039 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: Secondary | ICD-10-CM | POA: Diagnosis not present

## 2020-02-05 DIAGNOSIS — Z20822 Contact with and (suspected) exposure to covid-19: Secondary | ICD-10-CM | POA: Diagnosis present

## 2020-02-05 DIAGNOSIS — Z794 Long term (current) use of insulin: Secondary | ICD-10-CM

## 2020-02-05 DIAGNOSIS — J45909 Unspecified asthma, uncomplicated: Secondary | ICD-10-CM | POA: Diagnosis present

## 2020-02-05 DIAGNOSIS — Z888 Allergy status to other drugs, medicaments and biological substances status: Secondary | ICD-10-CM

## 2020-02-05 DIAGNOSIS — G9341 Metabolic encephalopathy: Secondary | ICD-10-CM | POA: Diagnosis present

## 2020-02-05 DIAGNOSIS — R262 Difficulty in walking, not elsewhere classified: Secondary | ICD-10-CM | POA: Diagnosis not present

## 2020-02-05 DIAGNOSIS — I4819 Other persistent atrial fibrillation: Secondary | ICD-10-CM | POA: Diagnosis not present

## 2020-02-05 DIAGNOSIS — F419 Anxiety disorder, unspecified: Secondary | ICD-10-CM | POA: Diagnosis present

## 2020-02-05 DIAGNOSIS — I480 Paroxysmal atrial fibrillation: Secondary | ICD-10-CM | POA: Diagnosis present

## 2020-02-05 DIAGNOSIS — R4182 Altered mental status, unspecified: Secondary | ICD-10-CM | POA: Diagnosis not present

## 2020-02-05 DIAGNOSIS — R1313 Dysphagia, pharyngeal phase: Secondary | ICD-10-CM | POA: Diagnosis not present

## 2020-02-05 DIAGNOSIS — I4891 Unspecified atrial fibrillation: Secondary | ICD-10-CM | POA: Diagnosis present

## 2020-02-05 DIAGNOSIS — Y95 Nosocomial condition: Secondary | ICD-10-CM | POA: Diagnosis present

## 2020-02-05 DIAGNOSIS — I5031 Acute diastolic (congestive) heart failure: Secondary | ICD-10-CM | POA: Diagnosis present

## 2020-02-05 DIAGNOSIS — J9690 Respiratory failure, unspecified, unspecified whether with hypoxia or hypercapnia: Secondary | ICD-10-CM | POA: Diagnosis not present

## 2020-02-05 DIAGNOSIS — I130 Hypertensive heart and chronic kidney disease with heart failure and stage 1 through stage 4 chronic kidney disease, or unspecified chronic kidney disease: Secondary | ICD-10-CM | POA: Diagnosis present

## 2020-02-05 DIAGNOSIS — I509 Heart failure, unspecified: Secondary | ICD-10-CM | POA: Diagnosis not present

## 2020-02-05 DIAGNOSIS — R0602 Shortness of breath: Secondary | ICD-10-CM | POA: Diagnosis not present

## 2020-02-05 DIAGNOSIS — Z515 Encounter for palliative care: Secondary | ICD-10-CM | POA: Diagnosis not present

## 2020-02-05 DIAGNOSIS — Z8249 Family history of ischemic heart disease and other diseases of the circulatory system: Secondary | ICD-10-CM

## 2020-02-05 DIAGNOSIS — Z853 Personal history of malignant neoplasm of breast: Secondary | ICD-10-CM

## 2020-02-05 DIAGNOSIS — E876 Hypokalemia: Secondary | ICD-10-CM | POA: Diagnosis not present

## 2020-02-05 DIAGNOSIS — J9602 Acute respiratory failure with hypercapnia: Secondary | ICD-10-CM | POA: Diagnosis present

## 2020-02-05 DIAGNOSIS — I4892 Unspecified atrial flutter: Secondary | ICD-10-CM | POA: Diagnosis present

## 2020-02-05 DIAGNOSIS — I10 Essential (primary) hypertension: Secondary | ICD-10-CM | POA: Diagnosis not present

## 2020-02-05 DIAGNOSIS — Z9049 Acquired absence of other specified parts of digestive tract: Secondary | ICD-10-CM | POA: Diagnosis not present

## 2020-02-05 DIAGNOSIS — J9601 Acute respiratory failure with hypoxia: Secondary | ICD-10-CM | POA: Diagnosis not present

## 2020-02-05 DIAGNOSIS — E785 Hyperlipidemia, unspecified: Secondary | ICD-10-CM | POA: Diagnosis present

## 2020-02-05 DIAGNOSIS — M6530 Trigger finger, unspecified finger: Secondary | ICD-10-CM | POA: Diagnosis present

## 2020-02-05 DIAGNOSIS — Z8261 Family history of arthritis: Secondary | ICD-10-CM

## 2020-02-05 DIAGNOSIS — I110 Hypertensive heart disease with heart failure: Secondary | ICD-10-CM | POA: Diagnosis not present

## 2020-02-05 DIAGNOSIS — Z741 Need for assistance with personal care: Secondary | ICD-10-CM | POA: Diagnosis not present

## 2020-02-05 DIAGNOSIS — M1990 Unspecified osteoarthritis, unspecified site: Secondary | ICD-10-CM | POA: Diagnosis present

## 2020-02-05 DIAGNOSIS — M1909 Primary osteoarthritis, other specified site: Secondary | ICD-10-CM | POA: Diagnosis present

## 2020-02-05 DIAGNOSIS — C50911 Malignant neoplasm of unspecified site of right female breast: Secondary | ICD-10-CM | POA: Diagnosis not present

## 2020-02-05 DIAGNOSIS — M6281 Muscle weakness (generalized): Secondary | ICD-10-CM | POA: Diagnosis not present

## 2020-02-05 DIAGNOSIS — F329 Major depressive disorder, single episode, unspecified: Secondary | ICD-10-CM | POA: Diagnosis present

## 2020-02-05 DIAGNOSIS — Z66 Do not resuscitate: Secondary | ICD-10-CM | POA: Diagnosis present

## 2020-02-05 DIAGNOSIS — J189 Pneumonia, unspecified organism: Secondary | ICD-10-CM | POA: Diagnosis not present

## 2020-02-05 DIAGNOSIS — R1310 Dysphagia, unspecified: Secondary | ICD-10-CM | POA: Diagnosis present

## 2020-02-05 DIAGNOSIS — Z887 Allergy status to serum and vaccine status: Secondary | ICD-10-CM

## 2020-02-05 DIAGNOSIS — Z885 Allergy status to narcotic agent status: Secondary | ICD-10-CM

## 2020-02-05 DIAGNOSIS — Z803 Family history of malignant neoplasm of breast: Secondary | ICD-10-CM

## 2020-02-05 DIAGNOSIS — E1165 Type 2 diabetes mellitus with hyperglycemia: Secondary | ICD-10-CM | POA: Diagnosis present

## 2020-02-05 DIAGNOSIS — E1159 Type 2 diabetes mellitus with other circulatory complications: Secondary | ICD-10-CM | POA: Diagnosis not present

## 2020-02-05 DIAGNOSIS — Z91040 Latex allergy status: Secondary | ICD-10-CM

## 2020-02-05 DIAGNOSIS — R0902 Hypoxemia: Secondary | ICD-10-CM | POA: Diagnosis not present

## 2020-02-05 DIAGNOSIS — Z91018 Allergy to other foods: Secondary | ICD-10-CM

## 2020-02-05 DIAGNOSIS — J9811 Atelectasis: Secondary | ICD-10-CM | POA: Diagnosis not present

## 2020-02-05 DIAGNOSIS — R7981 Abnormal blood-gas level: Secondary | ICD-10-CM

## 2020-02-05 DIAGNOSIS — Z5111 Encounter for antineoplastic chemotherapy: Secondary | ICD-10-CM | POA: Diagnosis not present

## 2020-02-05 DIAGNOSIS — I5032 Chronic diastolic (congestive) heart failure: Secondary | ICD-10-CM | POA: Diagnosis not present

## 2020-02-05 DIAGNOSIS — Z7189 Other specified counseling: Secondary | ICD-10-CM | POA: Diagnosis not present

## 2020-02-05 DIAGNOSIS — J690 Pneumonitis due to inhalation of food and vomit: Secondary | ICD-10-CM | POA: Diagnosis not present

## 2020-02-05 DIAGNOSIS — K2100 Gastro-esophageal reflux disease with esophagitis, without bleeding: Secondary | ICD-10-CM | POA: Diagnosis present

## 2020-02-05 DIAGNOSIS — J90 Pleural effusion, not elsewhere classified: Secondary | ICD-10-CM | POA: Diagnosis not present

## 2020-02-05 DIAGNOSIS — J45901 Unspecified asthma with (acute) exacerbation: Secondary | ICD-10-CM | POA: Diagnosis not present

## 2020-02-05 DIAGNOSIS — J188 Other pneumonia, unspecified organism: Secondary | ICD-10-CM | POA: Diagnosis present

## 2020-02-05 LAB — BASIC METABOLIC PANEL
Anion gap: 12 (ref 5–15)
BUN: 20 mg/dL (ref 8–23)
CO2: 32 mmol/L (ref 22–32)
Calcium: 8.3 mg/dL — ABNORMAL LOW (ref 8.9–10.3)
Chloride: 99 mmol/L (ref 98–111)
Creatinine, Ser: 0.7 mg/dL (ref 0.44–1.00)
GFR calc Af Amer: >60 mL/min (ref >60)
GFR calc non Af Amer: >60 mL/min (ref >60)
Glucose, Bld: 285 mg/dL — ABNORMAL HIGH (ref 70–99)
Potassium: 3 mmol/L — ABNORMAL LOW (ref 3.5–5.1)
Sodium: 143 mmol/L (ref 135–145)

## 2020-02-05 LAB — CBG MONITORING, ED
Glucose-Capillary: 227 mg/dL — ABNORMAL HIGH (ref 70–99)
Glucose-Capillary: 264 mg/dL — ABNORMAL HIGH (ref 70–99)
Glucose-Capillary: 222 mg/dL — ABNORMAL HIGH (ref 70–99)

## 2020-02-05 LAB — URINALYSIS, ROUTINE W REFLEX MICROSCOPIC
Bacteria, UA: NONE SEEN
Bilirubin Urine: NEGATIVE
Glucose, UA: >=500 mg/dL — AB
Hgb urine dipstick: NEGATIVE
Ketones, ur: NEGATIVE mg/dL
Leukocytes,Ua: NEGATIVE
Nitrite: NEGATIVE
Protein, ur: 30 mg/dL — AB
Specific Gravity, Urine: 1.017 (ref 1.005–1.030)
pH: 6 (ref 5.0–8.0)

## 2020-02-05 LAB — PROTIME-INR
INR: 1.9 — ABNORMAL HIGH (ref 0.8–1.2)
Prothrombin Time: 21 seconds — ABNORMAL HIGH (ref 11.4–15.2)

## 2020-02-05 LAB — CBC
HCT: 32.4 % — ABNORMAL LOW (ref 36.0–46.0)
Hemoglobin: 9.8 g/dL — ABNORMAL LOW (ref 12.0–15.0)
MCH: 28.7 pg (ref 26.0–34.0)
MCHC: 30.2 g/dL (ref 30.0–36.0)
MCV: 94.7 fL (ref 80.0–100.0)
Platelets: 344 10*3/uL (ref 150–400)
RBC: 3.42 MIL/uL — ABNORMAL LOW (ref 3.87–5.11)
RDW: 14.4 % (ref 11.5–15.5)
WBC: 8.5 10*3/uL (ref 4.0–10.5)
nRBC: 0 % (ref 0.0–0.2)

## 2020-02-05 LAB — LACTIC ACID, PLASMA: Lactic Acid, Venous: 1 mmol/L (ref 0.5–1.9)

## 2020-02-05 LAB — GLUCOSE, CAPILLARY: Glucose-Capillary: 207 mg/dL — ABNORMAL HIGH (ref 70–99)

## 2020-02-05 LAB — MAGNESIUM: Magnesium: 2.2 mg/dL (ref 1.7–2.4)

## 2020-02-05 LAB — BLOOD GAS, ARTERIAL
Acid-Base Excess: 9.3 mmol/L — ABNORMAL HIGH (ref 0.0–2.0)
Bicarbonate: 32.3 mmol/L — ABNORMAL HIGH (ref 20.0–28.0)
FIO2: 40
O2 Saturation: 98.6 %
Patient temperature: 36.2
pCO2 arterial: 60.4 mmHg — ABNORMAL HIGH (ref 32.0–48.0)
pH, Arterial: 7.377 (ref 7.350–7.450)
pO2, Arterial: 134 mmHg — ABNORMAL HIGH (ref 83.0–108.0)

## 2020-02-05 LAB — APTT: aPTT: 37 seconds — ABNORMAL HIGH (ref 24–36)

## 2020-02-05 LAB — SARS CORONAVIRUS 2 BY RT PCR (HOSPITAL ORDER, PERFORMED IN ~~LOC~~ HOSPITAL LAB): SARS Coronavirus 2: NEGATIVE

## 2020-02-05 MED ORDER — VANCOMYCIN HCL 750 MG/150ML IV SOLN: 750.0000 mg | INTRAVENOUS | Status: DC

## 2020-02-05 MED ORDER — RISPERIDONE 0.5 MG PO TABS
0.5000 mg | ORAL_TABLET | Freq: Two times a day (BID) | ORAL | Status: DC
  Filled 2020-02-05: qty 1

## 2020-02-05 MED ORDER — ANASTROZOLE 1 MG PO TABS
1.0000 mg | ORAL_TABLET | Freq: Every day | ORAL | Status: DC
  Administered 2020-02-06 – 2020-02-14 (×9): 1 mg via ORAL
  Filled 2020-02-05 (×12): qty 1

## 2020-02-05 MED ORDER — SIMVASTATIN 20 MG PO TABS: 20.0000 mg | ORAL_TABLET | Freq: Every evening | ORAL | Status: DC

## 2020-02-05 MED ORDER — DONEPEZIL HCL 5 MG PO TABS
5.0000 mg | ORAL_TABLET | Freq: Every day | ORAL | Status: DC
  Filled 2020-02-05: qty 1

## 2020-02-05 MED ORDER — SIMVASTATIN 20 MG PO TABS
20.0000 mg | ORAL_TABLET | Freq: Every evening | ORAL | Status: DC
  Administered 2020-02-06 – 2020-02-13 (×8): 20 mg via ORAL
  Filled 2020-02-05 (×8): qty 1

## 2020-02-05 MED ORDER — ONDANSETRON HCL 4 MG PO TABS: 4.0000 mg | ORAL_TABLET | Freq: Four times a day (QID) | ORAL | Status: DC | PRN

## 2020-02-05 MED ORDER — LACTATED RINGERS IV BOLUS (SEPSIS)
250.0000 mL | Freq: Once | INTRAVENOUS | Status: AC
  Administered 2020-02-05: 250 mL via INTRAVENOUS

## 2020-02-05 MED ORDER — ONDANSETRON HCL 4 MG/2ML IJ SOLN: 4.0000 mg | Freq: Four times a day (QID) | INTRAMUSCULAR | Status: DC | PRN

## 2020-02-05 MED ORDER — INSULIN ASPART 100 UNIT/ML ~~LOC~~ SOLN
0.0000 [IU] | Freq: Three times a day (TID) | SUBCUTANEOUS | Status: DC
  Administered 2020-02-06 (×2): 3 [IU] via SUBCUTANEOUS
  Administered 2020-02-07: 2 [IU] via SUBCUTANEOUS
  Administered 2020-02-07: 3 [IU] via SUBCUTANEOUS
  Administered 2020-02-08 – 2020-02-09 (×2): 2 [IU] via SUBCUTANEOUS
  Administered 2020-02-09: 3 [IU] via SUBCUTANEOUS
  Administered 2020-02-10 – 2020-02-11 (×2): 2 [IU] via SUBCUTANEOUS
  Administered 2020-02-11 – 2020-02-12 (×4): 3 [IU] via SUBCUTANEOUS
  Administered 2020-02-13: 5 [IU] via SUBCUTANEOUS
  Administered 2020-02-13: 3 [IU] via SUBCUTANEOUS
  Administered 2020-02-14: 2 [IU] via SUBCUTANEOUS
  Administered 2020-02-14: 5 [IU] via SUBCUTANEOUS

## 2020-02-05 MED ORDER — AMLODIPINE BESYLATE 5 MG PO TABS: 5.0000 mg | ORAL_TABLET | Freq: Every day | ORAL | Status: DC

## 2020-02-05 MED ORDER — SODIUM CHLORIDE 0.9% FLUSH
3.0000 mL | Freq: Two times a day (BID) | INTRAVENOUS | Status: DC
  Administered 2020-02-06 – 2020-02-14 (×15): 3 mL via INTRAVENOUS

## 2020-02-05 MED ORDER — SODIUM CHLORIDE 0.9 % IV SOLN
250.0000 mL | INTRAVENOUS | Status: DC | PRN
  Administered 2020-02-06: 250 mL via INTRAVENOUS

## 2020-02-05 MED ORDER — LACTATED RINGERS IV BOLUS (SEPSIS)
1000.0000 mL | Freq: Once | INTRAVENOUS | Status: AC
  Administered 2020-02-05: 1000 mL via INTRAVENOUS

## 2020-02-05 MED ORDER — RISPERIDONE 0.5 MG PO TABS
0.5000 mg | ORAL_TABLET | Freq: Two times a day (BID) | ORAL | Status: DC
  Administered 2020-02-05 – 2020-02-13 (×16): 0.5 mg via ORAL
  Filled 2020-02-05 (×15): qty 1

## 2020-02-05 MED ORDER — DONEPEZIL HCL 5 MG PO TABS
5.0000 mg | ORAL_TABLET | Freq: Every day | ORAL | Status: DC
  Administered 2020-02-05 – 2020-02-13 (×9): 5 mg via ORAL
  Filled 2020-02-05 (×8): qty 1

## 2020-02-05 MED ORDER — SODIUM CHLORIDE 0.9% FLUSH: 3.0000 mL | INTRAVENOUS | Status: DC | PRN

## 2020-02-05 MED ORDER — APIXABAN 5 MG PO TABS
5.0000 mg | ORAL_TABLET | Freq: Two times a day (BID) | ORAL | Status: DC
  Administered 2020-02-05 – 2020-02-14 (×18): 5 mg via ORAL
  Filled 2020-02-05: qty 1
  Filled 2020-02-05: qty 2
  Filled 2020-02-05 (×16): qty 1

## 2020-02-05 MED ORDER — SODIUM CHLORIDE 0.9 % IV SOLN
500.0000 mg | Freq: Once | INTRAVENOUS | Status: AC
  Administered 2020-02-05: 500 mg via INTRAVENOUS
  Filled 2020-02-05: qty 500

## 2020-02-05 MED ORDER — FUROSEMIDE 10 MG/ML IJ SOLN
20.0000 mg | Freq: Two times a day (BID) | INTRAMUSCULAR | Status: DC
  Administered 2020-02-05 – 2020-02-09 (×9): 20 mg via INTRAVENOUS
  Filled 2020-02-05 (×10): qty 2

## 2020-02-05 MED ORDER — SODIUM CHLORIDE 0.9 % IV SOLN
2.0000 g | Freq: Two times a day (BID) | INTRAVENOUS | Status: DC
  Administered 2020-02-05 – 2020-02-12 (×15): 2 g via INTRAVENOUS
  Filled 2020-02-05 (×15): qty 2

## 2020-02-05 MED ORDER — ACETAMINOPHEN 325 MG PO TABS: 650.0000 mg | ORAL_TABLET | Freq: Four times a day (QID) | ORAL | Status: DC | PRN

## 2020-02-05 MED ORDER — APIXABAN 5 MG PO TABS
5.0000 mg | ORAL_TABLET | Freq: Two times a day (BID) | ORAL | Status: DC
  Filled 2020-02-05: qty 1

## 2020-02-05 MED ORDER — AMLODIPINE BESYLATE 5 MG PO TABS
5.0000 mg | ORAL_TABLET | Freq: Every day | ORAL | Status: DC
  Administered 2020-02-06 – 2020-02-14 (×9): 5 mg via ORAL
  Filled 2020-02-05 (×9): qty 1

## 2020-02-05 MED ORDER — INSULIN ASPART 100 UNIT/ML ~~LOC~~ SOLN
0.0000 [IU] | Freq: Every day | SUBCUTANEOUS | Status: DC
  Administered 2020-02-05 – 2020-02-10 (×2): 2 [IU] via SUBCUTANEOUS
  Administered 2020-02-11 – 2020-02-12 (×2): 3 [IU] via SUBCUTANEOUS
  Administered 2020-02-13: 4 [IU] via SUBCUTANEOUS

## 2020-02-05 MED ORDER — POTASSIUM CHLORIDE 10 MEQ/100ML IV SOLN
INTRAVENOUS | Status: AC
  Administered 2020-02-05: 10 meq
  Filled 2020-02-05: qty 100

## 2020-02-05 MED ORDER — ACETAMINOPHEN 650 MG RE SUPP: 650.0000 mg | Freq: Four times a day (QID) | RECTAL | Status: DC | PRN

## 2020-02-05 MED ORDER — POLYETHYLENE GLYCOL 3350 17 G PO PACK: 17.0000 g | PACK | Freq: Every day | ORAL | Status: DC | PRN

## 2020-02-05 MED ORDER — POTASSIUM CHLORIDE CRYS ER 20 MEQ PO TBCR
40.0000 meq | EXTENDED_RELEASE_TABLET | Freq: Once | ORAL | Status: AC
  Administered 2020-02-05: 40 meq via ORAL
  Filled 2020-02-05: qty 2

## 2020-02-05 MED ORDER — AMIODARONE HCL 200 MG PO TABS
200.0000 mg | ORAL_TABLET | Freq: Every day | ORAL | Status: DC
  Administered 2020-02-06 – 2020-02-14 (×9): 200 mg via ORAL
  Filled 2020-02-05 (×9): qty 1

## 2020-02-05 MED ORDER — ENOXAPARIN SODIUM 40 MG/0.4ML ~~LOC~~ SOLN
40.0000 mg | SUBCUTANEOUS | Status: DC
  Filled 2020-02-05: qty 0.4

## 2020-02-05 MED ORDER — VANCOMYCIN HCL 1750 MG/350ML IV SOLN
1750.0000 mg | Freq: Once | INTRAVENOUS | Status: AC
  Administered 2020-02-05: 1750 mg via INTRAVENOUS
  Filled 2020-02-05: qty 350

## 2020-02-05 MED ORDER — POTASSIUM CHLORIDE 10 MEQ/100ML IV SOLN
10.0000 meq | INTRAVENOUS | Status: AC
  Administered 2020-02-05 (×4): 10 meq via INTRAVENOUS
  Filled 2020-02-05 (×3): qty 100

## 2020-02-05 NOTE — ED Provider Notes (Signed)
[facility] EMERGENCY DEPARTMENT Provider Note   CSN: [id number] Arrival date & time: 02/05/20  [id number]     History Chief Complaint  Patient presents with  . Hyperglycemia  . hxypoxia    Amy Hoffman is a 79 y.o. female.  HPI Patient presents for evaluation by private vehicle, for elevated blood sugar and trouble breathing.  Symptoms noticed today by a family friend who came to visit her.  She was discharged from a nursing care facility about 2 weeks ago after admission for respiratory distress.  She is not on home oxygen, currently.  Patient is unable to give any history at all.  Patient markedly hypoxic at triage, placed on nasal cannula oxygen with improvement.   Level 5 caveat-altered mental status    Past Medical History:  Diagnosis Date  . Abnormal uterine and vaginal bleeding, unspecified   . Allergic rhinitis due to pollen   . Anxiety   . Asthma   . Cancer [facility])    breast cancer - right  . Dementia ([place])   . Depression   . Depression   . Diabetes mellitus without complication ([place])   . Essential (primary) hypertension   . Hyperlipidemia, unspecified   . Hypertension   . Major depressive disorder, single episode, unspecified   . Obesity, unspecified   . Pain in joint involving shoulder region   . Reflux esophagitis   . Trigger finger, acquired   . Type 2 diabetes mellitus with hyperglycemia ([place])   . Unspecified asthma with (acute) exacerbation   . Unspecified asthma, uncomplicated   . 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 ([place])   . Unspecified osteoarthritis, unspecified site     Patient Active Problem List   Diagnosis Date Noted  . Advanced care planning/counseling discussion   . Goals of care, counseling/discussion   . Acute respiratory failure with hypoxia ([place])   . Dementia with behavioral disturbance ([place])   . Palliative care by specialist   . Asthma exacerbation 12/16/2019  . Atrial fibrillation with rapid ventricular response  ([place]) 12/16/2019  . Community acquired pneumonia 12/16/2019  . Hyperlipidemia, unspecified   . Obesity, unspecified   . Type 2 diabetes mellitus with hyperglycemia ([place])   . Abnormal uterine and vaginal bleeding, unspecified   . Allergic rhinitis due to pollen   . Essential (primary) hypertension   . Major depressive disorder, single episode, unspecified   . Unspecified asthma with (acute) exacerbation   . Unspecified asthma, uncomplicated   . 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 ([place])   . Unspecified osteoarthritis, unspecified site   . Pain in joint involving shoulder region   . Dysphagia 07/10/2019  . Loss of weight 07/10/2019  . Numbness and tingling in hands 04/17/2014  . SOB (shortness of breath) 01/30/2014  . Breast CA ([place]) 01/30/2014  . Elevated blood pressure 01/30/2014    Past Surgical History:  Procedure Laterality Date  . CHOLECYSTECTOMY    . MASTECTOMY Right   . RE-EXCISION OF BREAST LUMPECTOMY       OB History   No obstetric history on file.     Family History  Family history unknown: Yes    Social History   Tobacco Use  . Smoking status: Never Smoker  . Smokeless tobacco: Never Used  Substance Use Topics  . Alcohol use: No  . Drug use: No    Home Medications Prior to Admission medications   Medication Sig Start Date End Date Taking? Authorizing Provider  amiodarone (PACERONE) 200 MG tablet Take 400mg   twice daily for 5 days, then 200mg  twice daily for 14 days, then 200mg  daily thereafter 12/22/19  Yes Manuella Ghazi, Pratik D, DO  amLODipine (NORVASC) 5 MG tablet Take 5 mg by mouth daily.   Yes [provider]  anastrozole (ARIMIDEX) 1 MG tablet Take 1 mg by mouth daily.   Yes [provider]  apixaban (ELIQUIS) 5 MG TABS tablet Take 1 tablet (5 mg total) by mouth 2 (two) times daily. 12/22/19 02/05/20 Yes Shah, Pratik D, DO  donepezil (ARICEPT) 5 MG tablet Take 5 mg by mouth at bedtime.   Yes [provider]    glimepiride (AMARYL) 4 MG tablet Take 8 mg by mouth daily.    Yes [provider]  Multiple Vitamin (MULTIVITAMIN ADULT PO) Take 1 tablet by mouth daily.   Yes [provider]  risperiDONE (RISPERDAL) 0.5 MG tablet Take 0.5 mg by mouth 2 (two) times daily.   Yes [provider]  simvastatin (ZOCOR) 40 MG tablet Take 20 mg by mouth every evening.    Yes [provider]    Allergies    Codeine and Zantac [ranitidine hcl]  Review of Systems   Review of Systems  Unable to perform ROS: Mental status change    Physical Exam Updated Vital Signs BP (!) 159/79   Pulse 64   Temp 98.1 F (36.7 C) (Oral)   Resp 18   Ht 5\' 5"  (1.651 m)   Wt 68 kg   SpO2 100%   BMI 24.96 kg/m   Physical Exam Vitals and nursing note reviewed.  Constitutional:      General: She is not in acute distress.    Appearance: She is well-developed. She is not ill-appearing or diaphoretic.  HENT:     Head: Normocephalic and atraumatic.     Right Ear: External ear normal.     Left Ear: External ear normal.  Eyes:     Conjunctiva/sclera: Conjunctivae normal.     Pupils: Pupils are equal, round, and reactive to light.  Neck:     Trachea: Phonation normal.  Cardiovascular:     Rate and Rhythm: Normal rate and regular rhythm.     Heart sounds: Normal heart sounds.  Pulmonary:     Effort: No respiratory distress.     Breath sounds: No stridor. No rhonchi.     Comments: Decreased breath sounds, left greater than right. Abdominal:     General: There is no distension.     Palpations: Abdomen is soft.     Tenderness: There is no abdominal tenderness.  Musculoskeletal:        General: Swelling present. Normal range of motion.     Cervical back: Normal range of motion and neck supple.     Right lower leg: Edema present.     Left lower leg: Edema present.  Skin:    General: Skin is warm and dry.  Neurological:     Mental Status: She is alert.     Cranial Nerves: No cranial  nerve deficit.     Motor: No abnormal muscle tone.     Coordination: Coordination normal.  Psychiatric:     Comments: Lethargic     ED Results / Procedures / Treatments   Labs (all labs ordered are listed, but only abnormal results are displayed) Labs Reviewed  BASIC METABOLIC PANEL - Abnormal; Notable for the following components:      Result Value   Potassium 3.0 (*)    Glucose, Bld 285 (*)  Calcium 8.3 (*)    All other components within normal limits  CBC - Abnormal; Notable for the following components:   RBC 3.42 (*)    Hemoglobin 9.8 (*)    HCT 32.4 (*)    All other components within normal limits  URINALYSIS, ROUTINE W REFLEX MICROSCOPIC - Abnormal; Notable for the following components:   Glucose, UA >=500 (*)    Protein, ur 30 (*)    All other components within normal limits  APTT - Abnormal; Notable for the following components:   aPTT 37 (*)    All other components within normal limits  PROTIME-INR - Abnormal; Notable for the following components:   Prothrombin Time 21.0 (*)    INR 1.9 (*)    All other components within normal limits  BLOOD GAS, ARTERIAL - Abnormal; Notable for the following components:   pCO2 arterial 60.4 (*)    pO2, Arterial 134 (*)    Bicarbonate 32.3 (*)    Acid-Base Excess 9.3 (*)    All other components within normal limits  CBG MONITORING, ED - Abnormal; Notable for the following components:   Glucose-Capillary 227 (*)    All other components within normal limits  CBG MONITORING, ED - Abnormal; Notable for the following components:   Glucose-Capillary 264 (*)    All other components within normal limits  CBG MONITORING, ED - Abnormal; Notable for the following components:   Glucose-Capillary 222 (*)    All other components within normal limits  SARS CORONAVIRUS 2 BY RT PCR (HOSPITAL ORDER, [facility])  CULTURE, BLOOD (ROUTINE X 2)  CULTURE, BLOOD (ROUTINE X 2)  URINE CULTURE  MRSA PCR SCREENING    LACTIC ACID, PLASMA  URINALYSIS, ROUTINE W REFLEX MICROSCOPIC  MAGNESIUM    EKG None  Radiology DG Chest Port 1 View  Result Date: 02/05/2020 CLINICAL DATA:  Low O2 sats, altered mental status, hyperglycemia EXAM: PORTABLE CHEST 1 VIEW COMPARISON:  12/16/2019 FINDINGS: Patchy bilateral airspace opacities have progressed since prior study concerning for multifocal pneumonia. Mild cardiomegaly. Small right pleural effusion. No pneumothorax or acute bony abnormality. IMPRESSION: Worsening patchy bilateral airspace disease compatible with multifocal pneumonia. Small right effusion. Electronically Signed   By: Rolm Baptise M.D.   On: 02/05/2020 11:11    Procedures .Critical Care Performed by: Daleen Bo, MD Authorized by: Daleen Bo, MD   Critical care provider statement:    Critical care time (minutes):  35   Critical care start time:  02/05/2020 11:30 AM   Critical care end time:  02/05/2020 3:59 PM   Critical care time was exclusive of:  Separately billable procedures and treating other patients   Critical care was necessary to treat or prevent imminent or life-threatening deterioration of the following conditions:  Respiratory failure   Critical care was time spent personally by me on the following activities:  Blood draw for specimens, development of treatment plan with patient or surrogate, discussions with consultants, evaluation of patient's response to treatment, examination of patient, obtaining history from patient or surrogate, ordering and performing treatments and interventions, ordering and review of laboratory studies, pulse oximetry, re-evaluation of patient's condition, review of old charts and ordering and review of radiographic studies   (including critical care time)  Medications Ordered in ED Medications  ceFEPIme (MAXIPIME) 2 g in sodium chloride 0.9 % 100 mL IVPB (0 g Intravenous Stopped 02/05/20 1311)  vancomycin (VANCOREADY) IVPB 1750 mg/350 mL (  Intravenous Restarted 02/05/20 1531)  vancomycin (VANCOREADY)  IVPB 750 mg/150 mL (0 mg Intravenous Paused 02/05/20 1520)  potassium chloride 10 mEq in 100 mL IVPB (has no administration in time range)  azithromycin (ZITHROMAX) 500 mg in sodium chloride 0.9 % 250 mL IVPB (0 mg Intravenous Stopped 02/05/20 1431)  lactated ringers bolus 1,000 mL (0 mLs Intravenous Stopped 02/05/20 1344)    And  lactated ringers bolus 1,000 mL (0 mLs Intravenous Stopped 02/05/20 1344)    And  lactated ringers bolus 250 mL (0 mLs Intravenous Stopped 02/05/20 1352)    ED Course  I have reviewed the triage vital signs and the nursing notes.  Pertinent labs & imaging results that were available during my care of the patient were reviewed by me and considered in my medical decision making (see chart for details).  Clinical Course as of Feb 04 1558  Mon Feb 05, 2020  1529 High  CBG monitoring, ED(!) [EW]  1530 Normal  SARS Coronavirus 2 by RT PCR (hospital order, performed in [facility] hospital lab) Nasopharyngeal Nasopharyngeal Swab [EW]  1530 Normal except glucose high, protein high  Urinalysis, Routine w reflex microscopic(!) [EW]  1530 Elevated   [EW]  1530 Normal except PCO2 high, PO2 high, bicarb high, acid-base high   [EW]  1530 Normal except potassium low, glucose high, calcium low  Basic metabolic panel(!) [EW]  [id number] Per radiologist compatible with multifocal pneumonia   [EW]    Clinical Course User Index [EW] Daleen Bo, MD   MDM Rules/Calculators/A&P                       Patient Vitals for the past 24 hrs:  BP Temp Temp src Pulse Resp SpO2 Height Weight  02/05/20 1430 (!) 159/79 -- -- 64 18 100 % -- --  02/05/20 1400 (!) 164/76 -- -- 69 15 99 % -- --  02/05/20 1330 134/88 -- -- 72 20 98 % -- --  02/05/20 1300 90/79 -- -- 66 16 100 % -- --  02/05/20 1236 (!) 173/66 -- -- -- 13 -- -- --  02/05/20 1130 (!) 162/68 -- -- 64 16 98 % -- --  02/05/20 1121 (!) 180/78 -- -- 70 15 100 % -- --   02/05/20 1043 -- -- -- -- -- 100 % -- --  02/05/20 1042 -- -- -- -- -- (!) 60 % -- --  02/05/20 1040 -- -- -- -- -- -- 5\' 5"  (1.651 m) 68 kg  02/05/20 1039 (!) 161/71 98.1 F (36.7 C) Oral 61 20 -- -- --  02/05/20 1038 -- -- -- -- -- (!) 63 % -- --    3:45 PM Reevaluation with update and discussion. After initial assessment and treatment, an updated evaluation reveals at this time, she is resting comfortably.  Oxygenation normal on 5 L per nasal cannula at this time.  Patient's friend updated on findings and plan I also discussed the findings with her husband on the telephone. Daleen Bo   Medical Decision Making:  This patient is presenting for evaluation of trouble breathing, which does require a range of treatment options, and is a complaint that involves a high risk of morbidity and mortality. The differential diagnoses include pneumonia, bronchitis, COPD exacerbation. I decided to review old records, and in summary patient recently treated for pneumonia with hypoxia, went to rehab, from the hospital and got worse after being released..  I obtained additional historical information from family friend and husband.  Clinical Laboratory Tests Ordered, included CBC,  Metabolic panel, Urinalysis and Lactate, blood gas review indicates hypokalemia, hyperglycemia, elevated PCO2. Radiologic Tests Ordered, included chest x-ray.  I independently Visualized: Radiographs images, which show bilateral multifocal infiltrate  Cardiac Monitor Tracing which shows normal sinus rhythm    Critical Interventions-clinical evaluation, IV fluids, laboratory testing, observation, reassessment  After These Interventions, the Patient was reevaluated and was found, more comfortable on nasal cannula oxygen.  CRITICAL CARE-yes Performed by: Daleen Bo  Nursing Notes Reviewed/ Care Coordinated Applicable Imaging Reviewed Interpretation of Laboratory Data incorporated into ED treatment  3:58 PM-Consult  complete with hospitalist. Patient case explained and discussed.  They agree to admit patient for further evaluation and treatment. Call ended at 1615  Plan: Admission    Final Clinical Impression(s) / ED Diagnoses Final diagnoses:  Healthcare-associated pneumonia  Hypokalemia  Hypoxia  Palliative care patient    Rx / DC Orders ED Discharge Orders    None       Daleen Bo, MD 02/06/20 1942

## 2020-02-05 NOTE — ED Triage Notes (Signed)
Pt brought in due to increased CBG over 400, not as alert. Per caregiver seemed SOB. Sats in triage 63 % on room air . O2 initiated at 3 L via [place]. Not eating as well

## 2020-02-05 NOTE — ED Notes (Signed)
Attempted x 2 for IV access without success.  Another nurse to assess for access.

## 2020-02-05 NOTE — ED Notes (Signed)
ED TO INPATIENT HANDOFF REPORT  ED Nurse Name and Phone #: [phone]  S Name/Age/Gender Amy Hoffman 79 y.o. female Room/Bed: APA17/APA17  Code Status   Code Status: Full Code  Home/SNF/Other Home Patient oriented to: self, place, time and situation Is this baseline? No   Triage Complete: Triage complete  Chief Complaint Multifocal pneumonia [J18.9]  Triage Note Pt brought in due to increased CBG over 400, not as alert. Per caregiver seemed SOB. Sats in triage 63 % on room air . O2 initiated at 3 L via [place]. Not eating as well    Allergies Allergies  Allergen Reactions  . Codeine   . Zantac [Ranitidine Hcl]     Level of Care/Admitting Diagnosis ED Disposition    ED Disposition Condition [facility] Area: [facility] [id number]  Level of Care: Telemetry [5]  Covid Evaluation: Confirmed COVID Negative  Diagnosis: Multifocal pneumonia [id number]  Admitting Physician: [place], [place]  Attending Physician: Roney Jaffe [[id number]]  Estimated length of stay: 3 - 4 days  Certification:: I certify this patient will need inpatient services for at least 2 midnights       B Medical/Surgery History Past Medical History:  Diagnosis Date  . Abnormal uterine and vaginal bleeding, unspecified   . Allergic rhinitis due to pollen   . Anxiety   . Asthma   . Cancer [facility])    breast cancer - right  . Dementia ([place])   . Depression   . Depression   . Diabetes mellitus without complication ([place])   . Essential (primary) hypertension   . Hyperlipidemia, unspecified   . Hypertension   . Major depressive disorder, single episode, unspecified   . Obesity, unspecified   . Pain in joint involving shoulder region   . Reflux esophagitis   . Trigger finger, acquired   . Type 2 diabetes mellitus with hyperglycemia ([place])   . Unspecified asthma with (acute) exacerbation   . Unspecified asthma, uncomplicated   . Unspecified dementia without behavioral  disturbance ([place])   . Unspecified osteoarthritis, unspecified site    Past Surgical History:  Procedure Laterality Date  . CHOLECYSTECTOMY    . MASTECTOMY Right   . RE-EXCISION OF BREAST LUMPECTOMY       A IV Location/Drains/Wounds Patient Lines/Drains/Airways Status   Active Line/Drains/Airways    Name:   Placement date:   Placement time:   Site:   Days:   Peripheral IV 02/05/20 Left Hand   02/05/20    1531    Hand   less than 1   External Urinary Catheter   02/05/20    1142    -   less than 1          Intake/Output Last 24 hours  Intake/Output Summary (Last 24 hours) at 02/05/2020 1834 Last data filed at 02/05/2020 1833 Gross per 24 hour  Intake 3151.16 ml  Output 13 ml  Net 3138.16 ml    Labs/Imaging Results for orders placed or performed during the hospital encounter of 02/05/20 (from the past 48 hour(s))  CBG monitoring, ED     Status: Abnormal   Collection Time: 02/05/20 10:39 AM  Result Value Ref Range   Glucose-Capillary 227 (H) 70 - 99 mg/dL    Comment: Glucose reference range applies only to samples taken after fasting for at least 8 hours.  Basic metabolic panel     Status: Abnormal   Collection Time: 02/05/20 11:24 AM  Result Value Ref Range  Sodium 143 135 - 145 mmol/L   Potassium 3.0 (L) 3.5 - 5.1 mmol/L   Chloride 99 98 - 111 mmol/L   CO2 32 22 - 32 mmol/L   Glucose, Bld 285 (H) 70 - 99 mg/dL    Comment: Glucose reference range applies only to samples taken after fasting for at least 8 hours.   BUN 20 8 - 23 mg/dL   Creatinine, Ser 0.70 0.44 - 1.00 mg/dL   Calcium 8.3 (L) 8.9 - 10.3 mg/dL   GFR calc non Af Amer >60 >60 mL/min   GFR calc Af Amer >60 >60 mL/min   Anion gap 12 5 - 15    Comment: Performed at [facility], [address], [place], [place] [address]  CBC     Status: Abnormal   Collection Time: 02/05/20 11:24 AM  Result Value Ref Range   WBC 8.5 4.0 - 10.5 K/uL   RBC 3.42 (L) 3.87 - 5.11 MIL/uL   Hemoglobin 9.8 (L) 12.0 - 15.0 g/dL    HCT 32.4 (L) 36.0 - 46.0 %   MCV 94.7 80.0 - 100.0 fL   MCH 28.7 26.0 - 34.0 pg   MCHC 30.2 30.0 - 36.0 g/dL   RDW 14.4 11.5 - 15.5 %   Platelets 344 150 - 400 K/uL   nRBC 0.0 0.0 - 0.2 %    Comment: Performed at [facility], [address], [place], [place] [address]  Magnesium     Status: None   Collection Time: 02/05/20 11:26 AM  Result Value Ref Range   Magnesium 2.2 1.7 - 2.4 mg/dL    Comment: Performed at [facility], [address] [place], [place] [address]  CBG monitoring, ED     Status: Abnormal   Collection Time: 02/05/20 11:29 AM  Result Value Ref Range   Glucose-Capillary 264 (H) 70 - 99 mg/dL    Comment: Glucose reference range applies only to samples taken after fasting for at least 8 hours.  Urinalysis, Routine w reflex microscopic     Status: Abnormal   Collection Time: 02/05/20 11:47 AM  Result Value Ref Range   Color, Urine YELLOW YELLOW   APPearance CLEAR CLEAR   Specific Gravity, Urine 1.017 1.005 - 1.030   pH 6.0 5.0 - 8.0   Glucose, UA >=500 (A) NEGATIVE mg/dL   Hgb urine dipstick NEGATIVE NEGATIVE   Bilirubin Urine NEGATIVE NEGATIVE   Ketones, ur NEGATIVE NEGATIVE mg/dL   Protein, ur 30 (A) NEGATIVE mg/dL   Nitrite NEGATIVE NEGATIVE   Leukocytes,Ua NEGATIVE NEGATIVE   RBC / HPF 0-5 0 - 5 RBC/hpf   WBC, UA 0-5 0 - 5 WBC/hpf   Bacteria, UA NONE SEEN NONE SEEN   Squamous Epithelial / LPF 0-5 0 - 5   Mucus PRESENT    Hyaline Casts, UA PRESENT     Comment: Performed at [facility], [address], [place], [place] [address]  Blood gas, arterial     Status: Abnormal   Collection Time: 02/05/20 12:11 PM  Result Value Ref Range   FIO2 40.00    pH, Arterial 7.377 7.350 - 7.450   pCO2 arterial 60.4 (H) 32.0 - 48.0 mmHg   pO2, Arterial 134 (H) 83.0 - 108.0 mmHg   Bicarbonate 32.3 (H) 20.0 - 28.0 mmol/L   Acid-Base Excess 9.3 (H) 0.0 - 2.0 mmol/L   O2 Saturation 98.6 %   Patient temperature 36.2    Allens test (pass/fail) PASS PASS    Comment:  Performed  at [facility], [address], [place], [place] [address]  SARS Coronavirus 2 by RT PCR (hospital order, performed in [facility] hospital lab) Nasopharyngeal Nasopharyngeal Swab     Status: None   Collection Time: 02/05/20 12:13 PM   Specimen: Nasopharyngeal Swab  Result Value Ref Range   SARS Coronavirus 2 NEGATIVE NEGATIVE    Comment: (NOTE) SARS-CoV-2 target nucleic acids are NOT DETECTED. The SARS-CoV-2 RNA is generally detectable in upper and lower respiratory specimens during the acute phase of infection. The lowest concentration of SARS-CoV-2 viral copies this assay can detect is 250 copies / mL. A negative result does not preclude SARS-CoV-2 infection and should not be used as the sole basis for treatment or other patient management decisions.  A negative result may occur with improper specimen collection / handling, submission of specimen other than nasopharyngeal swab, presence of viral mutation(s) within the areas targeted by this assay, and inadequate number of viral copies (<250 copies / mL). A negative result must be combined with clinical observations, patient history, and epidemiological information. Fact Sheet for Patients:   StrictlyIdeas.no Fact Sheet for Healthcare Providers: BankingDealers.co.za This test is not yet approved or cleared  by the [place] FDA and has been authorized for detection and/or diagnosis of SARS-CoV-2 by FDA under an Emergency Use Authorization (EUA).  This EUA will remain in effect (meaning this test can be used) for the duration of the COVID-19 declaration under Section 564(b)(1) of the Act, 21 U.S.C. section 360bbb-3(b)(1), unless the authorization is terminated or revoked sooner. Performed at [facility], [address] [place], [place] [address]   Lactic acid, plasma     Status: None   Collection Time: 02/05/20 12:24 PM  Result Value Ref Range   Lactic Acid, Venous 1.0  0.5 - 1.9 mmol/L    Comment: Performed at [facility], [address], [place], [place] [address]  APTT     Status: Abnormal   Collection Time: 02/05/20 12:34 PM  Result Value Ref Range   aPTT 37 (H) 24 - 36 seconds    Comment:        IF BASELINE aPTT IS ELEVATED, SUGGEST PATIENT RISK ASSESSMENT BE USED TO DETERMINE APPROPRIATE ANTICOAGULANT THERAPY. Performed at [facility], [address] [place], [place] [address]   Protime-INR     Status: Abnormal   Collection Time: 02/05/20 12:34 PM  Result Value Ref Range   Prothrombin Time 21.0 (H) 11.4 - 15.2 seconds   INR 1.9 (H) 0.8 - 1.2    Comment: (NOTE) INR goal varies based on device and disease states. Performed at [facility], [address], [place],  [address]   CBG monitoring, ED     Status: Abnormal   Collection Time: 02/05/20  3:26 PM  Result Value Ref Range   Glucose-Capillary 222 (H) 70 - 99 mg/dL    Comment: Glucose reference range applies only to samples taken after fasting for at least 8 hours.   DG Chest Port 1 View  Result Date: 02/05/2020 CLINICAL DATA:  Low O2 sats, altered mental status, hyperglycemia EXAM: PORTABLE CHEST 1 VIEW COMPARISON:  12/16/2019 FINDINGS: Patchy bilateral airspace opacities have progressed since prior study concerning for multifocal pneumonia. Mild cardiomegaly. Small right pleural effusion. No pneumothorax or acute bony abnormality. IMPRESSION: Worsening patchy bilateral airspace disease compatible with multifocal pneumonia. Small right effusion. Electronically Signed   By: Rolm Baptise M.D.   On: 02/05/2020 11:11    Pending Labs Unresulted Labs (From admission, onward)  Start     Ordered   02/07/20 0500  CBC  Every Mon-Wed-Fri (0500),   R     02/05/20 1232   02/07/20 [id number]  Basic metabolic panel  Every Mon-Wed-Fri (0500),   R     02/05/20 1232   02/05/20 1232  MRSA PCR Screening  ONCE - STAT,   STAT     02/05/20 1232   02/05/20 1210  Blood Culture (routine x 2)  BLOOD CULTURE  X 2,   STAT     02/05/20 1210   02/05/20 1210  Urinalysis, Routine w reflex microscopic  ONCE - STAT,   STAT     02/05/20 1210   02/05/20 1210  Urine culture  ONCE - STAT,   STAT     02/05/20 1210          Vitals/Pain Today's Vitals   02/05/20 1700 02/05/20 1712 02/05/20 1730 02/05/20 1816  BP: (!) 169/84  (!) 157/75   Pulse: 69  66   Resp: 19  15   Temp:  (!) 96 F (35.6 C)  (!) 96.2 F (35.7 C)  TempSrc:  Rectal  Rectal  SpO2: 99%  99%   Weight:      Height:      PainSc:        Isolation Precautions No active isolations  Medications Medications  ceFEPIme (MAXIPIME) 2 g in sodium chloride 0.9 % 100 mL IVPB (0 g Intravenous Stopped 02/05/20 1311)  vancomycin (VANCOREADY) IVPB 750 mg/150 mL (0 mg Intravenous Paused 02/05/20 1520)  potassium chloride 10 mEq in 100 mL IVPB (10 mEq Intravenous New Bag/Given 02/05/20 1834)  enoxaparin (LOVENOX) injection 40 mg (has no administration in time range)  sodium chloride flush (NS) 0.9 % injection 3 mL (has no administration in time range)  sodium chloride flush (NS) 0.9 % injection 3 mL (has no administration in time range)  0.9 %  sodium chloride infusion (has no administration in time range)  acetaminophen (TYLENOL) tablet 650 mg (has no administration in time range)    Or  acetaminophen (TYLENOL) suppository 650 mg (has no administration in time range)  ondansetron (ZOFRAN) tablet 4 mg (has no administration in time range)    Or  ondansetron (ZOFRAN) injection 4 mg (has no administration in time range)  polyethylene glycol (MIRALAX / GLYCOLAX) packet 17 g (has no administration in time range)  azithromycin (ZITHROMAX) 500 mg in sodium chloride 0.9 % 250 mL IVPB (0 mg Intravenous Stopped 02/05/20 1431)  lactated ringers bolus 1,000 mL (0 mLs Intravenous Stopped 02/05/20 1344)    And  lactated ringers bolus 1,000 mL (0 mLs Intravenous Stopped 02/05/20 1344)    And  lactated ringers bolus 250 mL (0 mLs Intravenous Stopped 02/05/20  1352)  vancomycin (VANCOREADY) IVPB 1750 mg/350 mL (0 mg Intravenous Stopped 02/05/20 1714)    Mobility walks High fall risk   Focused Assessments   R Recommendations: See Admitting Provider Note  Report given to:   Additional Notes:

## 2020-02-05 NOTE — ED Notes (Signed)
Dr Sedonia Small informed rectal temp 96.0. Pt given warm blankets.

## 2020-02-05 NOTE — H&P (Deleted)
[facility] History & Physical  Amy Splinter MD  Amy Hoffman 02/05/2020  Chief Complaint: AMS/ drowsy, ^^CBG/ SOB HPI: The patient is a 79 y.o. year-old w/ hx of NIDDM, dementia, HTN, HL, depression, asthma who presents w/ SOB and some dec'd MS/ drowsiness. In ED SpO2 very low in the 60's on RA.  Improved on nasal flow O2.  CXR shows bilat infiltrates. Given IV abx for suspected PNA, asked to see for admission.   Pt was admitted here in April for SOB and hypoxemia w/ resp distress. CXR showed bilat infiltrates, pt admitted and treated as PNA/ asthma. Has afib / RVR rx'd w/ po amiodarone. Palliative care saw pt and she was made a DNR. Perhaps pt went to SNF at dc.   Pt is vague historian.  No c/o at this time.  SpO2 is 96% on nasal O2 at 5 lpm.  Denies any CP, prod cough , fevers or chills.    ROS  denies CP  no joint pain   no HA  no blurry vision  no rash  no diarrhea  no nausea/ vomiting  no dysuria  no difficulty voiding  no change in urine color    Past Medical History  Past Medical History:  Diagnosis Date  . Allergy   . Arthritis    neck  . Cataract    bilateral - MD monitoring cataracts  . CHF (congestive heart failure) ([place])   . Chronic kidney disease, stage I    DR OTTELIN  HX UTIS  . Cirrhosis ([place])   . Cramp of limb   . Diabetes mellitus   . Dysphagia, unspecified(787.20)   . Dysuria   . Epistaxis   . GERD (gastroesophageal reflux disease)   . Heart murmur    NO CARDIOLOGIST  DX FOR YEARS ASYMPTOMATIC  . Lumbago   . Neoplasm of uncertain behavior of skin   . Nonspecific elevation of levels of transaminase or lactic acid dehydrogenase (LDH)   . Osteoarthrosis, unspecified whether generalized or localized, unspecified site   . Other and unspecified hyperlipidemia    diet controlled  . Pain in joint, shoulder region   . Paresthesias 08/07/2015  . Postablative ovarian failure   . Trochanteric bursitis of left hip 04/21/2016  . Type 2  diabetes mellitus without complication ([place])   . Unspecified essential hypertension    no meds   Past Surgical History  Past Surgical History:  Procedure Laterality Date  . BREAST BIOPSY    . CARDIAC CATHETERIZATION N/A 06/03/2016   Procedure: Left Heart Cath and Coronary Angiography;  Surgeon: Belva Crome, MD;  Location: [place] CV LAB;  Service: Cardiovascular;  Laterality: N/A;  . COLONOSCOPY  2012   Dr Lajoyce Corners.   . COLONOSCOPY WITH PROPOFOL N/A 11/12/2016   Procedure: COLONOSCOPY WITH PROPOFOL;  Surgeon: Milus Banister, MD;  Location: [facility] ENDOSCOPY;  Service: Endoscopy;  Laterality: N/A;  . 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 ENDOVEIN HARVEST OF GREATER SAPHENOUS VEIN Right 06/04/2016   Procedure: ENDOVEIN HARVEST OF GREATER SAPHENOUS VEIN;  Surgeon: Melrose Nakayama, MD;  Location: [place];  Service: Open Heart Surgery;  Laterality: Right;  . ESOPHAGEAL BANDING  08/03/2019   Procedure: ESOPHAGEAL BANDING;  Surgeon: Milus Banister, MD;  Location: [facility];  Service: Endoscopy;;  . ESOPHAGEAL BANDING  08/13/2019   Procedure: ESOPHAGEAL BANDING;  Surgeon: Juanita Craver, MD;  Location: [facility] ENDOSCOPY;  Service: Endoscopy;;  . ESOPHAGOGASTRODUODENOSCOPY N/A 08/13/2019   Procedure: ESOPHAGOGASTRODUODENOSCOPY (EGD);  Surgeon: Juanita Craver, MD;  Location: [facility] ENDOSCOPY;  Service: Endoscopy;  Laterality: N/A;  . ESOPHAGOGASTRODUODENOSCOPY (EGD) WITH PROPOFOL N/A 11/12/2016   Procedure: ESOPHAGOGASTRODUODENOSCOPY (EGD) WITH PROPOFOL;  Surgeon: Milus Banister, MD;  Location: [facility];  Service: Endoscopy;  Laterality: N/A;  . ESOPHAGOGASTRODUODENOSCOPY (EGD) WITH PROPOFOL N/A 08/03/2019   Procedure: ESOPHAGOGASTRODUODENOSCOPY (EGD) WITH PROPOFOL;  Surgeon: Milus Banister, MD;   Location: [facility];  Service: Endoscopy;  Laterality: N/A;  . HEMOSTASIS CLIP PLACEMENT  08/13/2019   Procedure: HEMOSTASIS CLIP PLACEMENT;  Surgeon: Juanita Craver, MD;  Location: [place] ENDOSCOPY;  Service: Endoscopy;;  . IR ANGIOGRAM SELECTIVE EACH ADDITIONAL VESSEL  08/14/2019  . IR EMBO ART  VEN HEMORR LYMPH EXTRAV  INC GUIDE ROADMAPPING  08/14/2019  . IR PARACENTESIS  08/14/2019  . IR TIPS  08/14/2019  . MAXIMUM ACCESS (MAS)POSTERIOR LUMBAR INTERBODY FUSION (PLIF) 1 LEVEL Left 10/16/2015   Procedure: FOR MAXIMUM ACCESS (MAS) POSTERIOR LUMBAR INTERBODY FUSION (PLIF) LUMBAR THREE-FOUR EXTRAFORAMINAL MICRODISCECTOMY LUMBAR FIVE-SACRAL ONE LEFT;  Surgeon: Eustace Moore, MD;  Location: [place] NEURO ORS;  Service: Neurosurgery;  Laterality: Left;  . RADIOLOGY WITH ANESTHESIA N/A 08/14/2019   Procedure: RADIOLOGY WITH ANESTHESIA;  Surgeon: Radiologist, Medication, MD;  Location: [place];  Service: Radiology;  Laterality: N/A;  . SCLEROTHERAPY  08/13/2019   Procedure: SCLEROTHERAPY;  Surgeon: Juanita Craver, MD;  Location: [facility] ENDOSCOPY;  Service: Endoscopy;;  . TEE WITHOUT CARDIOVERSION N/A 06/04/2016   Procedure: TRANSESOPHAGEAL ECHOCARDIOGRAM (TEE);  Surgeon: Melrose Nakayama, MD;  Location: [place];  Service: Open Heart Surgery;  Laterality: N/A;  . TUBAL LIGATION  1982   Dr Connye Burkitt  . UPPER GASTROINTESTINAL ENDOSCOPY    . VAGINAL HYSTERECTOMY  1997   Dr Rande Lawman   Family History  Family History  Problem Relation Age of Onset  . Lung cancer Father   . Arthritis Sister   . Arthritis Brother   . Heart disease Maternal Grandmother   . Heart disease Maternal Grandfather   . Heart disease Paternal Grandmother   . Heart disease Paternal Grandfather   . Breast cancer Mother   . Liver cancer Brother   . Breast cancer Maternal Aunt   . Breast cancer Paternal Aunt   . Colon cancer Neg Hx   . Esophageal cancer Neg Hx   . Rectal cancer Neg Hx   . Stomach cancer Neg Hx    Social History  reports that  she has never smoked. She has never used smokeless tobacco. She reports that she does not drink alcohol or use drugs. Allergies  Allergies  Allergen Reactions  . Kiwi Extract Anaphylaxis  . Tdap [Tetanus-Diphth-Acell Pertussis] Swelling and Other (See Comments)    Swelling at injection site, gets very hot  . Statins     RHABDOMYOLYSIS  . Latex Itching, Dermatitis and Rash  . Tramadol Nausea And Vomiting   Home medications Prior to Admission medications   Medication Sig Start Date End Date Taking? Authorizing Provider  acetaminophen (TYLENOL) 500 MG tablet Take 500 mg by mouth at bedtime.     [provider]  Aromatic Inhalants (VICKS VAPOR IN) Vicks Vapor Rub apply small amount to outside of nose to help breathing    [provider]  BD PEN NEEDLE NANO U/F 32G X 4  MM MISC USE THREE TIMES DAILY AS DIRECTED 04/03/19   Reed, Tiffany L, DO  Biotin 10000 MCG TABS Take 10,000 mcg by mouth every morning.    [provider]  bisacodyl (DULCOLAX) 10 MG suppository Place 1 suppository (10 mg total) rectally daily as needed for moderate constipation. 08/18/19   Aline August, MD  calcium carbonate (OS-CAL) 600 MG TABS Take 600 mg by mouth 2 (two) times daily with a meal.      [provider]  Cholecalciferol (VITAMIN D) 50 MCG (2000 UT) CAPS Take 2,000 Units by mouth daily.     [provider]  Cyanocobalamin (VITAMIN B 12 PO) Take 1,000 mcg by mouth daily.      [provider]  ezetimibe (ZETIA) 10 MG tablet TAKE 1 TABLET(10 MG) BY MOUTH DAILY 05/03/19   Reed, Tiffany L, DO  furosemide (LASIX) 40 MG tablet Take 40 mg by mouth daily.     [provider]  glucose blood test strip One Touch Ultra II strips. Use to test blood sugar three times daily. Dx: E11.65 10/07/17   Reed, Tiffany L, DO  insulin detemir (LEVEMIR) 100 UNIT/ML injection Inject 0.2 mLs (20 Units total) into the skin at bedtime. 08/19/19   Aline August, MD  Insulin  Syringe-Needle U-100 (INSULIN SYRINGE 1CC/31GX5/16") 31G X 5/16" 1 ML MISC USE AS DIRECTED DAILY WITH LEVEMIR 12/02/18   Reed, Tiffany L, DO  JARDIANCE 25 MG TABS tablet Take 25 mg by mouth daily. 09/05/19   [provider]  lactulose (CHRONULAC) 10 GM/15ML solution Take 20 g by mouth 3 (three) times daily. 09/09/19   [provider]  loratadine (CLARITIN) 10 MG tablet Take 10 mg by mouth daily as needed for allergies.    [provider]  MAGNESIUM PO Take 500 mg by mouth 2 (two) times daily in the am and at bedtime..    [provider]  Multiple Vitamins-Minerals (MULTIVITAMIN WITH MINERALS) tablet Take 1 tablet by mouth daily.      [provider]  NOVOLOG FLEXPEN 100 UNIT/ML FlexPen Inject 12 units under the skin every morning, 8 units at lunch and 12 units at supper 06/27/19   Reed, Tiffany L, DO  ondansetron (ZOFRAN) 4 MG tablet Take 1 tablet (4 mg total) by mouth every 6 (six) hours as needed for nausea. 08/18/19   Aline August, MD  pantoprazole (PROTONIX) 40 MG tablet Take 1 tablet (40 mg total) by mouth 2 (two) times daily. 08/18/19   Aline August, MD  Polyethyl Glycol-Propyl Glycol (SYSTANE OP) Place 1 drop into both eyes 2 (two) times daily.    [provider]  Probiotic Product (PROBIOTIC DAILY PO) Take 1 capsule by mouth daily. Digestive Advantage Probiotic    [provider]  spironolactone (ALDACTONE) 50 MG tablet Take 1 tablet (50 mg total) by mouth 2 (two) times daily. 08/21/19   Gayland Curry, DO   Liver Function Tests Recent Labs  Lab 09/18/19 1436 09/20/19 1042  AST 58* 62*  ALT 41* 45*  ALKPHOS  --  127*  BILITOT 3.5* 3.4*  PROT 7.8 7.2  ALBUMIN  --  3.0*   Recent Labs  Lab 09/20/19 1042  LIPASE 29   CBC Recent Labs  Lab 09/18/19 1436 09/20/19 1042 09/20/19 1056  WBC 10.4 7.7  --   NEUTROABS 8,299* 5.9  --   HGB 16.1* 14.9 15.0  HCT 47.2* 43.4 44.0  MCV 92.5 94.6  --   PLT 151 116*  --  Basic Metabolic Panel Recent Labs  Lab 09/18/19 1436 09/20/19 1042 09/20/19 1056  NA 133* 131* 133*  K 4.5 4.6 4.6  CL 96* 97*  --   CO2 24 23  --   GLUCOSE 269* 138*  --   BUN 19 20  --   CREATININE 1.05* 1.13*  --   CALCIUM 11.1* 10.0  --    Iron/TIBC/Ferritin/ %Sat    Component Value Date/Time   IRON 29 09/12/2016 1422   TIBC 427 09/12/2016 1422   FERRITIN 13 09/12/2016 1422   IRONPCTSAT 7 (L) 09/12/2016 1422    Vitals:   09/20/19 1018 09/20/19 1030 09/20/19 1045 09/20/19 1215  BP: (!) 123/51 (!) 101/46  (!) 125/54  Pulse: 89 83    Resp: 16 15  20   Temp: 97.8 F (36.6 C)  97.9 F (36.6 C)   TempSrc: Oral  Rectal   SpO2: 99% 98%      Exam Gen elderly pleasant WF, slight ^wob but mostly comfortable Ox "hospital", "9 oclock", "ms [place]" No rash, cyanosis or gangrene Sclera anicteric, throat clear No jvd or bruits Chest coarse bilat rales 1/3 up, no bronch BS RRR no MRG Abd soft ntnd no mass or ascites +bs GU purewick in place draining dark amber urine small amts MS no joint effusions or deformity Ext diffuse 2+ LE pitting edema from ankles to hips,  No wounds or ulcers  Neuro is alert, gen weak, pleasant , as above, nonfocal    Home meds:  - amiodarone 200/ norvasc 5/ eliquis 5 bid/ zocor 20 hs  - risperdal 0.5 bid/ aricept 5 hs  - anastrozole 1 qd  - amaryl 8 mg qd     Assessment/ Plan: 1. SOB/ hypoxemia / bilat pulm infiltrates - suspected PNA at first but CXR could also be c/w CHF.  Getting IV vanc/ cefepime. Was here in April w/ similar problems rx'd as PNA/ asthma. Her exam shows diffuse vol overload in the LE's, no hx of CHF in chart. Will treat her for vol overload w/ IV lasix 20 bid as well.  No fever/ ^WBC. May be able to dc IV abx if she responds well to diuresis.  2. Dementia - cont meds 3. EOL - was made DNR last admit, could not reach family to confirm this order so left full code at this time.  4. HTN - cont home meds 5. Atrial fib -  cont eliquis/ amio 6. DM 2 - SSI here 7. Hypokalemia - replete 8. Anemia - Hb 9.2, follow      Amy Splinter, MD   [facility] 09/20/2019, 1:10 PM

## 2020-02-05 NOTE — ED Notes (Signed)
Pt removed both IV, cleaned and changed.

## 2020-02-05 NOTE — ED Provider Notes (Signed)
  Provider Note MRN:  [id number]  Arrival date & time: 02/05/20    ED Course and Medical Decision Making  Assumed care from Dr. Eulis Foster at shift change.  Multifocal pneumonia with hypoxic respiratory failure, to be admitted.  On my assessment patient sitting comfortably, only mildly tachypneic on 5 L nasal cannula.  Will admit to hospital service.  .Critical Care Performed by: Maudie Flakes, MD Authorized by: Maudie Flakes, MD   Critical care provider statement:    Critical care time (minutes):  31   Critical care was necessary to treat or prevent imminent or life-threatening deterioration of the following conditions:  Respiratory failure   Critical care was time spent personally by me on the following activities:  Discussions with consultants, evaluation of patient's response to treatment, examination of patient, ordering and performing treatments and interventions, ordering and review of laboratory studies, ordering and review of radiographic studies, pulse oximetry, re-evaluation of patient's condition, obtaining history from patient or surrogate and review of old charts   I assumed direction of critical care for this patient from another provider in my specialty: yes      Final Clinical Impressions(s) / ED Diagnoses     ICD-10-CM   1. Healthcare-associated pneumonia  J18.9   2. Low oxygen saturation  R79.81 DG Chest [facility] 1 View    DG Chest Port 1 View  3. Hypokalemia  E87.6   4. Hypoxia  R09.02   5. Palliative care patient  Z51.5     ED Discharge Orders    None      Discharge Instructions   None     Barth Kirks. [place] Small, [place] [email or web address]    Maudie Flakes, MD 02/06/20 Dyann Kief

## 2020-02-05 NOTE — ED Notes (Signed)
Pt given warm blankets.

## 2020-02-05 NOTE — Progress Notes (Signed)
Pharmacy Antibiotic Note  Amy Hoffman is a 79 y.o. female admitted on 02/05/2020 with pneumonia.  Pharmacy has been consulted for vancomycin and cefepime  dosing.  Plan: Cefepime 2g IV q12h  Vancomycin 1.75g IV x1, then vancomycin 1,25g IV q24h      Goal vancomycin trough range:   15-20 mcg/mL Pharmacy will continue to monitor renal function, vancomycin troughs as clinically appropriate,  cultures and patient progress.    Height: 5\' 5"  (165.1 cm) Weight: 68 kg (150 lb) IBW/kg (Calculated) : 57  Temp (24hrs), Avg:98.1 F (36.7 C), Min:98.1 F (36.7 C), Max:98.1 F (36.7 C)  Recent Labs  Lab 02/05/20 1124  WBC 8.5  CREATININE 0.70    Estimated Creatinine Clearance: 51.3 mL/min (by C-G formula based on SCr of 0.7 mg/dL).    Allergies  Allergen Reactions  . Codeine   . Zantac [Ranitidine Hcl]      Antimicrobials this admission: vancomycin  5/24 >>   cefepime 5/24 >>  azithromycin 5/24>> x1 dose in ED  Dose adjustments this admission: cefepime  Microbiology results: 5/24  [facility] x2:   5/24 Resp PCR: SARS CoV-2: 5/34 MRSA PCR:  Thank you for allowing pharmacy to be a part of this patient's care.  Despina Pole 02/05/2020 12:37 PM

## 2020-02-05 NOTE — ED Notes (Signed)
Attempted to call report x 1  

## 2020-02-06 ENCOUNTER — Inpatient Hospital Stay (HOSPITAL_COMMUNITY): Payer: Medicare Other

## 2020-02-06 ENCOUNTER — Encounter (HOSPITAL_COMMUNITY): Payer: Self-pay | Admitting: Nephrology

## 2020-02-06 DIAGNOSIS — I4891 Unspecified atrial fibrillation: Secondary | ICD-10-CM

## 2020-02-06 DIAGNOSIS — I5031 Acute diastolic (congestive) heart failure: Secondary | ICD-10-CM

## 2020-02-06 DIAGNOSIS — F0391 Unspecified dementia with behavioral disturbance: Secondary | ICD-10-CM

## 2020-02-06 DIAGNOSIS — J9602 Acute respiratory failure with hypercapnia: Secondary | ICD-10-CM

## 2020-02-06 DIAGNOSIS — G9341 Metabolic encephalopathy: Secondary | ICD-10-CM | POA: Diagnosis present

## 2020-02-06 DIAGNOSIS — J9601 Acute respiratory failure with hypoxia: Secondary | ICD-10-CM

## 2020-02-06 LAB — CBC
HCT: 32.2 % — ABNORMAL LOW (ref 36.0–46.0)
Hemoglobin: 9.5 g/dL — ABNORMAL LOW (ref 12.0–15.0)
MCH: 28.3 pg (ref 26.0–34.0)
MCHC: 29.5 g/dL — ABNORMAL LOW (ref 30.0–36.0)
MCV: 95.8 fL (ref 80.0–100.0)
Platelets: 329 10*3/uL (ref 150–400)
RBC: 3.36 MIL/uL — ABNORMAL LOW (ref 3.87–5.11)
RDW: 14.3 % (ref 11.5–15.5)
WBC: 8 10*3/uL (ref 4.0–10.5)
nRBC: 0 % (ref 0.0–0.2)

## 2020-02-06 LAB — BLOOD GAS, ARTERIAL
Acid-Base Excess: 12.5 mmol/L — ABNORMAL HIGH (ref 0.0–2.0)
Bicarbonate: 35.4 mmol/L — ABNORMAL HIGH (ref 20.0–28.0)
FIO2: 36
O2 Saturation: 99.1 %
Patient temperature: 37
pCO2 arterial: 60.3 mmHg — ABNORMAL HIGH (ref 32.0–48.0)
pH, Arterial: 7.413 (ref 7.350–7.450)
pO2, Arterial: 150 mmHg — ABNORMAL HIGH (ref 83.0–108.0)

## 2020-02-06 LAB — BASIC METABOLIC PANEL
Anion gap: 10 (ref 5–15)
BUN: 13 mg/dL (ref 8–23)
CO2: 35 mmol/L — ABNORMAL HIGH (ref 22–32)
Calcium: 7.8 mg/dL — ABNORMAL LOW (ref 8.9–10.3)
Chloride: 100 mmol/L (ref 98–111)
Creatinine, Ser: 0.54 mg/dL (ref 0.44–1.00)
GFR calc Af Amer: >60 mL/min (ref >60)
GFR calc non Af Amer: >60 mL/min (ref >60)
Glucose, Bld: 194 mg/dL — ABNORMAL HIGH (ref 70–99)
Potassium: 3.3 mmol/L — ABNORMAL LOW (ref 3.5–5.1)
Sodium: 145 mmol/L (ref 135–145)

## 2020-02-06 LAB — AMMONIA: Ammonia: 38 umol/L — ABNORMAL HIGH (ref 9–35)

## 2020-02-06 LAB — GLUCOSE, CAPILLARY
Glucose-Capillary: 157 mg/dL — ABNORMAL HIGH (ref 70–99)
Glucose-Capillary: 68 mg/dL — ABNORMAL LOW (ref 70–99)
Glucose-Capillary: 169 mg/dL — ABNORMAL HIGH (ref 70–99)
Glucose-Capillary: 202 mg/dL — ABNORMAL HIGH (ref 70–99)

## 2020-02-06 LAB — BRAIN NATRIURETIC PEPTIDE: B Natriuretic Peptide: 172 pg/mL — ABNORMAL HIGH (ref 0.0–100.0)

## 2020-02-06 LAB — TSH: TSH: 1.503 u[IU]/mL (ref 0.350–4.500)

## 2020-02-06 LAB — T4, FREE: Free T4: 1.24 ng/dL — ABNORMAL HIGH (ref 0.61–1.12)

## 2020-02-06 LAB — PROCALCITONIN: Procalcitonin: <0.1 ng/mL

## 2020-02-06 LAB — MRSA PCR SCREENING: MRSA by PCR: NEGATIVE

## 2020-02-06 LAB — FOLATE: Folate: 6.2 ng/mL (ref >5.9)

## 2020-02-06 LAB — MAGNESIUM: Magnesium: 1.8 mg/dL (ref 1.7–2.4)

## 2020-02-06 LAB — VITAMIN B12: Vitamin B-12: 725 pg/mL (ref 180–914)

## 2020-02-06 MED ORDER — LORAZEPAM 2 MG/ML IJ SOLN
0.5000 mg | Freq: Once | INTRAMUSCULAR | Status: AC
  Administered 2020-02-07: 0.5 mg via INTRAVENOUS
  Filled 2020-02-06: qty 1

## 2020-02-06 NOTE — Progress Notes (Addendum)
PROGRESS NOTE  Amy Hoffman [id number] DOB: 1940/11/10 DOA: 02/05/2020 PCP: Doree Albee, MD  Brief History:  79 year old female with a history of dementia, diabetes mellitus type 2, hypertension, hyperlipidemia, asthma, right-sided breast cancer presenting with shortness of breath that was noticed by her family friends during a home visit.  Unfortunately, the patient is unable to provide any history at this time secondary to her dementia and encephalopathy.  In addition, there was concern for the patient's increased somnolence and drowsiness.  As result, the patient was brought to the emergency department for further evaluation.  In the ED, the patient remained somewhat drowsy with oxygen saturation in the 60% range.  The patient was placed on 5 L nasal cannula with oxygen saturation up to 98%.  Chest x-ray showed bilateral patchy infiltrates and increased interstitial markings.  WBC was 8.5.  BMP showed a potassium 3.0 with serum creatinine 0.70.  Notably, the patient was recently mated to the hospital from 12/16/2019 to 12/22/2019 secondary to respiratory failure from asthma exacerbation.  She also had atrial fibrillation with RVR at that time and was started on amiodarone and apixaban.  The patient was discharged to skilled nursing facility, [facility], where she stayed until up to 2 weeks ago when she returned home.  Assessment/Plan: Acute respiratory failure with hypoxia and hypercarbia -Secondary to pulmonary edema, CHF -5/24--7.37/60/134/32 (0.4) -Currently stable on 4 L -Wean oxygen for saturation greater than [id number]  Acute metabolic encephalopathy -Unable to reach family to clarify the patient's baseline function and mental status--I have tried to call the patient's daughter and son without answer this morning -She is alert and oriented x1 at the time of my evaluation -Repeat ABG 7.413/60/150/35 on 4L -Serum B12--7.25 -TSH--1.503 -UA negative for pyuria -Ammonia--38 -folate  6.2  Acute diastolic CHF -Continue furosemide 20 mg IV twice daily -The patient remains clinically fluid overloaded -Daily weights -Accurate I's and O's -12/27/2019 echo EF 50-55%, no WMA, trivial MR  HCAP -CT chest--extensive bilateral upper lobe airspace opacities, right greater than left. New small right pleural effusion and moderate left pleural effusion  -PCT<0.10 -Repeat chest x-ray a.m. 5/26 -MRSA screen--neg -continue cefepime -d/c vanco  Atrial flutter, type unspecified -Currently in sinus rhythm -Continue apixaban -Continue amiodarone  Essential hypertension -Continue amlodipine -Anticipate improvement with diuresis  Dementia with behavioral disturbance -Continue Aricept -Hold respiratory for now as the patient is somewhat sleepy and somnolent  Diabetes mellitus type 2 -12/16/2019 hemoglobin A1c 6.8 -NovoLog sliding scale -Holding Amaryl  Hyperlipidemia -Continue statin  Hypokalemia -Replete -Magnesium 1.8  Goals of care -Patient was seen by palliative medicine April 2021 -She was DNR at that time of discharge -Cannot reach family to verify presently -reconsult palliative for continued GOC      Status is: Inpatient  Remains inpatient appropriate because:Altered mental status and IV treatments appropriate due to intensity of illness or inability to take PO   Dispo: The patient is from: Home              Anticipated d/c is to: SNF              Anticipated d/c date is: 2 days              Patient currently is not medically stable to d/c.         Family Communication:   Family at bedside  Consultants:    Code Status:  FULL / DNR  DVT Prophylaxis:  [place] Heparin / [place] Lovenox   Procedures: As Listed in Progress Note Above  Antibiotics: vanco 5/24>> Cefepime 5/24>>       Subjective: Patient awakens to voice.  She answers only by speaking her name.  She is drowsy and falls back asleep.  She is not following commands.  No reports  of respiratory distress, vomiting, diarrhea, or uncontrolled pain.  Objective: Vitals:   02/05/20 1830 02/05/20 1944 02/06/20 0004 02/06/20 0516  BP: (!) 175/87 (!) 162/75 (!) 152/96 (!) 155/88  Pulse: 65 65 71 70  Resp: 15 18 20 20   Temp: (!) 96.2 F (35.7 C) 97.8 F (36.6 C) (!) 97.5 F (36.4 C) 97.6 F (36.4 C)  TempSrc: Rectal Oral Oral Oral  SpO2: 98% 96% 100% 98%  Weight:      Height:        Intake/Output Summary (Last 24 hours) at 02/06/2020 [id number] Last data filed at 02/06/2020 0500 Gross per 24 hour  Intake 3501.16 ml  Output 1913 ml  Net 1588.16 ml   Weight change:  Exam:   General:  Pt is alert, follows commands appropriately, not in acute distress  HEENT: No icterus, No thrush, No neck mass, [place]/AT  Cardiovascular: RRR, S1/S2, no rubs, no gallops +JVD  Respiratory: Bilateral crackles but no wheezing.  Good air movement.  Abdomen: Soft/+BS, non tender, non distended, no guarding  Extremities: 2+ LE edema, No lymphangitis, No petechiae, No rashes, no synovitis   Data Reviewed: I have personally reviewed following labs and imaging studies Basic Metabolic Panel: Recent Labs  Lab 02/05/20 1124 02/05/20 1126 02/06/20 0533  NA 143  --  145  K 3.0*  --  3.3*  CL 99  --  100  CO2 32  --  35*  GLUCOSE 285*  --  194*  BUN 20  --  13  CREATININE 0.70  --  0.54  CALCIUM 8.3*  --  7.8*  MG  --  2.2 1.8   Liver Function Tests: No results for input(s): AST, ALT, ALKPHOS, BILITOT, PROT, ALBUMIN in the last 168 hours. No results for input(s): LIPASE, AMYLASE in the last 168 hours. No results for input(s): AMMONIA in the last 168 hours. Coagulation Profile: Recent Labs  Lab 02/05/20 1234  INR 1.9*   CBC: Recent Labs  Lab 02/05/20 1124 02/06/20 0533  WBC 8.5 8.0  HGB 9.8* 9.5*  HCT 32.4* 32.2*  MCV 94.7 95.8  PLT 344 329   Cardiac Enzymes: No results for input(s): CKTOTAL, CKMB, CKMBINDEX, TROPONINI in the last 168 hours. BNP: Invalid input(s):  POCBNP CBG: Recent Labs  Lab 02/05/20 1039 02/05/20 1129 02/05/20 1526 02/05/20 2305  GLUCAP 227* 264* 222* 207*   HbA1C: No results for input(s): HGBA1C in the last 72 hours. Urine analysis:    Component Value Date/Time   COLORURINE YELLOW 02/05/2020 1147   APPEARANCEUR CLEAR 02/05/2020 1147   LABSPEC 1.017 02/05/2020 1147   PHURINE 6.0 02/05/2020 1147   GLUCOSEU >=500 (A) 02/05/2020 1147   HGBUR NEGATIVE 02/05/2020 1147   BILIRUBINUR NEGATIVE 02/05/2020 1147   KETONESUR NEGATIVE 02/05/2020 1147   PROTEINUR 30 (A) 02/05/2020 1147   UROBILINOGEN 0.2 01/23/2013 2317   NITRITE NEGATIVE 02/05/2020 1147   LEUKOCYTESUR NEGATIVE 02/05/2020 1147   Sepsis Labs: @LABRCNTIP (procalcitonin:4,lacticidven:4) ) Recent Results (from the past 240 hour(s))  SARS Coronavirus 2 by RT PCR (hospital order, performed in [place] hospital lab) Nasopharyngeal Nasopharyngeal Swab     Status: None   Collection Time: 02/05/20 12:13  PM   Specimen: Nasopharyngeal Swab  Result Value Ref Range Status   SARS Coronavirus 2 NEGATIVE NEGATIVE Final    Comment: (NOTE) SARS-CoV-2 target nucleic acids are NOT DETECTED. The SARS-CoV-2 RNA is generally detectable in upper and lower respiratory specimens during the acute phase of infection. The lowest concentration of SARS-CoV-2 viral copies this assay can detect is 250 copies / mL. A negative result does not preclude SARS-CoV-2 infection and should not be used as the sole basis for treatment or other patient management decisions.  A negative result may occur with improper specimen collection / handling, submission of specimen other than nasopharyngeal swab, presence of viral mutation(s) within the areas targeted by this assay, and inadequate number of viral copies (<250 copies / mL). A negative result must be combined with clinical observations, patient history, and epidemiological information. Fact Sheet for Patients:     StrictlyIdeas.no Fact Sheet for Healthcare Providers: BankingDealers.co.za This test is not yet approved or cleared  by the [place] FDA and has been authorized for detection and/or diagnosis of SARS-CoV-2 by FDA under an Emergency Use Authorization (EUA).  This EUA will remain in effect (meaning this test can be used) for the duration of the COVID-19 declaration under Section 564(b)(1) of the Act, 21 U.S.C. section 360bbb-3(b)(1), unless the authorization is terminated or revoked sooner. Performed at [facility], [address], [place], [place] [address]      Scheduled Meds: . amiodarone  200 mg Oral Daily  . amLODipine  5 mg Oral Daily  . anastrozole  1 mg Oral Daily  . apixaban  5 mg Oral BID  . donepezil  5 mg Oral QHS  . furosemide  20 mg Intravenous Q12H  . insulin aspart  0-15 Units Subcutaneous TID WC  . insulin aspart  0-5 Units Subcutaneous QHS  . risperiDONE  0.5 mg Oral BID  . simvastatin  20 mg Oral QPM  . sodium chloride flush  3 mL Intravenous Q12H   Continuous Infusions: . sodium chloride    . ceFEPime (MAXIPIME) IV 2 g (02/06/20 0101)  . vancomycin Stopped (02/05/20 1520)    Procedures/Studies: DG Chest Port 1 View  Result Date: 02/05/2020 CLINICAL DATA:  Low O2 sats, altered mental status, hyperglycemia EXAM: PORTABLE CHEST 1 VIEW COMPARISON:  12/16/2019 FINDINGS: Patchy bilateral airspace opacities have progressed since prior study concerning for multifocal pneumonia. Mild cardiomegaly. Small right pleural effusion. No pneumothorax or acute bony abnormality. IMPRESSION: Worsening patchy bilateral airspace disease compatible with multifocal pneumonia. Small right effusion. Electronically Signed   By: Rolm Baptise M.D.   On: 02/05/2020 11:11    Orson Eva, DO  [facility]  If 7PM-7AM, please contact night-coverage [email or web address] Password [email or web address] 02/06/2020, 7:28 AM   LOS: 1 day

## 2020-02-06 NOTE — TOC Initial Note (Signed)
Transition of Care [facility]) - Initial/Assessment Note    Patient Details  Name: Amy Hoffman MRN: [id number] Date of Birth: 20-Jan-1941  [facility] CM/SW Contact:    Natasha Bence, LCSW Phone Number: 02/06/2020, 1:26 PM  Clinical Narrative:                 Patient readmitted for acute respatory failure. Patient has transportation upon discharge. Equipment TOC to follow.         Patient Goals and CMS Choice        Expected Discharge Plan and Services                                                Prior Living Arrangements/Services                       Activities of Daily Living Home Assistive Devices/Equipment: Wheelchair ADL Screening (condition at time of admission) Patient's cognitive ability adequate to safely complete daily activities?: No Is the patient deaf or have difficulty hearing?: No Does the patient have difficulty seeing, even when wearing glasses/contacts?: No Does the patient have difficulty concentrating, remembering, or making decisions?: Yes(h/o dementia) Patient able to express need for assistance with ADLs?: Yes Does the patient have difficulty dressing or bathing?: Yes Independently performs ADLs?: No Communication: Independent Dressing (OT): Needs assistance Is this a change from baseline?: Pre-admission baseline Grooming: Needs assistance Is this a change from baseline?: Pre-admission baseline Feeding: Independent Is this a change from baseline?: Pre-admission baseline Bathing: Needs assistance Is this a change from baseline?: Pre-admission baseline Toileting: Needs assistance Is this a change from baseline?: Pre-admission baseline In/Out Bed: Needs assistance Is this a change from baseline?: Pre-admission baseline Walks in Home: Needs assistance Is this a change from baseline?: Pre-admission baseline Does the patient have difficulty walking or climbing stairs?: Yes Weakness of Legs: Both Weakness of  Arms/Hands: None  Permission Sought/Granted                  Emotional Assessment              Admission diagnosis:  Hypokalemia [E87.6] Hypoxia [R09.02] Healthcare-associated pneumonia [J18.9] Low oxygen saturation [R79.81] Palliative care patient [Z51.5] Multifocal pneumonia [J18.9] Patient Active Problem List   Diagnosis Date Noted  . Acute respiratory failure with hypoxia and hypercarbia ([place]) 02/06/2020  . Acute diastolic CHF (congestive heart failure) ([place]) 02/06/2020  . Acute metabolic encephalopathy [id number]  . Multifocal pneumonia 02/05/2020  . CHF (congestive heart failure) ([place]) 02/05/2020  . Hypokalemia   . Hypoxia   . Low oxygen saturation   . Advanced care planning/counseling discussion   . Goals of care, counseling/discussion   . Acute respiratory failure with hypoxia ([place])   . Dementia with behavioral disturbance ([place])   . Palliative care by specialist   . Asthma exacerbation 12/16/2019  . Atrial fibrillation ([place]) 12/16/2019  . Community acquired pneumonia 12/16/2019  . Hyperlipidemia, unspecified   . Obesity, unspecified   . Type 2 diabetes mellitus with hemoglobin A1c goal of less than 7.0% (HCC)   . Abnormal uterine and vaginal bleeding, unspecified   . Allergic rhinitis due to pollen   . Essential (primary) hypertension   . Major depressive disorder, single episode, unspecified   . Unspecified asthma with (acute) exacerbation   . Unspecified asthma, uncomplicated   .  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 ([place])   . Unspecified osteoarthritis, unspecified site   . Pain in joint involving shoulder region   . Dysphagia 07/10/2019  . Loss of weight 07/10/2019  . Numbness and tingling in hands 04/17/2014  . SOB (shortness of breath) 01/30/2014  . Breast CA ([place]) 01/30/2014  . Elevated blood pressure 01/30/2014   PCP:  Doree Albee, MD Pharmacy:   [facility], [address] [place] [address] Phone: [phone] Fax: [phone]     Social Determinants of Health (SDOH) Interventions    Readmission Risk Interventions No flowsheet data found.

## 2020-02-06 NOTE — Progress Notes (Signed)
TRH night shift.  The patient has been very restless and trying to get out of bed, which is interfering with her nursing care.  She is usually on risperidone at bedtime, which was held.  Lorazepam 0.5 mg IVP x1 ordered.  Tennis Must, MD

## 2020-02-06 NOTE — TOC Initial Note (Signed)
Transition of Care [facility]) - Initial/Assessment Note    Patient Details  Name: Amy Hoffman MRN: [id number] Date of Birth: April 18, 1941  [facility]) CM/SW Contact:    Natasha Bence, LCSW Phone Number: 02/06/2020, 5:09 PM  Clinical Narrative:                 Patient has community support at home that assist with her ADLs. Family would like to obtain [facility] for medical care and education for patient care. Patient has 3 n 1 commode, walker, and wheel chair at home. Family will consider SNF temporary placement depending on PT evaluation.    Expected Discharge Plan: Skilled Nursing Facility Barriers to Discharge: Continued Medical Work up   Patient Goals and CMS Choice Patient states their goals for this hospitalization and ongoing recovery are:: Family wants SNF and then [facility]   Choice offered to / list presented to : Adult Children  Expected Discharge Plan and Services Expected Discharge Plan: [place] Acute Care Choice: [place] arrangements for the past 2 months: Single Family Home                                      Prior Living Arrangements/Services Living arrangements for the past 2 months: Single Family Home Lives with:: Spouse Patient language and need for interpreter reviewed:: Yes Do you feel safe going back to the place where you live?: Yes      Need for Family Participation in Patient Care: Yes (Comment) Care giver support system in place?: Yes (comment) Current home services: DME Criminal Activity/Legal Involvement Pertinent to Current Situation/Hospitalization: No - Comment as needed  Activities of Daily Living Home Assistive Devices/Equipment: Wheelchair ADL Screening (condition at time of admission) Patient's cognitive ability adequate to safely complete daily activities?: No Is the patient deaf or have difficulty hearing?: No Does the patient have difficulty seeing, even when wearing  glasses/contacts?: No Does the patient have difficulty concentrating, remembering, or making decisions?: Yes(h/o dementia) Patient able to express need for assistance with ADLs?: Yes Does the patient have difficulty dressing or bathing?: Yes Independently performs ADLs?: No Communication: Independent Dressing (OT): Needs assistance Is this a change from baseline?: Pre-admission baseline Grooming: Needs assistance Is this a change from baseline?: Pre-admission baseline Feeding: Independent Is this a change from baseline?: Pre-admission baseline Bathing: Needs assistance Is this a change from baseline?: Pre-admission baseline Toileting: Needs assistance Is this a change from baseline?: Pre-admission baseline In/Out Bed: Needs assistance Is this a change from baseline?: Pre-admission baseline Walks in Home: Needs assistance Is this a change from baseline?: Pre-admission baseline Does the patient have difficulty walking or climbing stairs?: Yes Weakness of Legs: Both Weakness of Arms/Hands: None  Permission Sought/Granted   Permission granted to share information with : Yes, Verbal Permission Granted  Share Information with NAME: Denny Peon     Permission granted to share info w Relationship: Daughter     Emotional Assessment Appearance:: Appears stated age Attitude/Demeanor/Rapport: Lethargic Affect (typically observed): Appropriate   Alcohol / Substance Use: Not Applicable Psych Involvement: No (comment)  Admission diagnosis:  Hypokalemia [E87.6] Hypoxia [R09.02] Healthcare-associated pneumonia [J18.9] Low oxygen saturation [R79.81] Palliative care patient [Z51.5] Multifocal pneumonia [J18.9] Patient Active Problem List   Diagnosis Date Noted  . Acute respiratory failure with hypoxia and hypercarbia ([place]) 02/06/2020  . Acute diastolic CHF (congestive heart failure) (  [place]) 02/06/2020  . Acute metabolic encephalopathy [id number]  . Multifocal pneumonia 02/05/2020  .  CHF (congestive heart failure) ([place]) 02/05/2020  . Hypokalemia   . Hypoxia   . Low oxygen saturation   . Advanced care planning/counseling discussion   . Goals of care, counseling/discussion   . Acute respiratory failure with hypoxia ([place])   . Dementia with behavioral disturbance ([place])   . Palliative care by specialist   . Asthma exacerbation 12/16/2019  . Atrial fibrillation ([place]) 12/16/2019  . Community acquired pneumonia 12/16/2019  . Hyperlipidemia, unspecified   . Obesity, unspecified   . Type 2 diabetes mellitus with hemoglobin A1c goal of less than 7.0% (HCC)   . Abnormal uterine and vaginal bleeding, unspecified   . Allergic rhinitis due to pollen   . Essential (primary) hypertension   . Major depressive disorder, single episode, unspecified   . Unspecified asthma with (acute) exacerbation   . Unspecified asthma, uncomplicated   . 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 ([place])   . Unspecified osteoarthritis, unspecified site   . Pain in joint involving shoulder region   . Dysphagia 07/10/2019  . Loss of weight 07/10/2019  . Numbness and tingling in hands 04/17/2014  . SOB (shortness of breath) 01/30/2014  . Breast CA ([place]) 01/30/2014  . Elevated blood pressure 01/30/2014   PCP:  Doree Albee, MD Pharmacy:   [facility], [place] [address] Phone: [phone] Fax: [phone]     Social Determinants of Health (SDOH) Interventions    Readmission Risk Interventions Readmission Risk Prevention Plan 02/06/2020  Transportation Screening Complete  Medication Review (RN Care Manager) Complete  PCP or Specialist appointment within 3-5 days of discharge Not Complete  Some recent data might be hidden

## 2020-02-07 LAB — GLUCOSE, CAPILLARY
Glucose-Capillary: 133 mg/dL — ABNORMAL HIGH (ref 70–99)
Glucose-Capillary: 162 mg/dL — ABNORMAL HIGH (ref 70–99)
Glucose-Capillary: 78 mg/dL (ref 70–99)
Glucose-Capillary: 109 mg/dL — ABNORMAL HIGH (ref 70–99)

## 2020-02-07 LAB — CBC
HCT: 33.1 % — ABNORMAL LOW (ref 36.0–46.0)
Hemoglobin: 10.1 g/dL — ABNORMAL LOW (ref 12.0–15.0)
MCH: 29.4 pg (ref 26.0–34.0)
MCHC: 30.5 g/dL (ref 30.0–36.0)
MCV: 96.5 fL (ref 80.0–100.0)
Platelets: 333 10*3/uL (ref 150–400)
RBC: 3.43 MIL/uL — ABNORMAL LOW (ref 3.87–5.11)
RDW: 14.4 % (ref 11.5–15.5)
WBC: 9.3 10*3/uL (ref 4.0–10.5)
nRBC: 0 % (ref 0.0–0.2)

## 2020-02-07 LAB — BASIC METABOLIC PANEL
Anion gap: 10 (ref 5–15)
BUN: 11 mg/dL (ref 8–23)
CO2: 43 mmol/L — ABNORMAL HIGH (ref 22–32)
Calcium: 7.9 mg/dL — ABNORMAL LOW (ref 8.9–10.3)
Chloride: 92 mmol/L — ABNORMAL LOW (ref 98–111)
Creatinine, Ser: 0.54 mg/dL (ref 0.44–1.00)
GFR calc Af Amer: >60 mL/min (ref >60)
GFR calc non Af Amer: >60 mL/min (ref >60)
Glucose, Bld: 159 mg/dL — ABNORMAL HIGH (ref 70–99)
Potassium: 3.1 mmol/L — ABNORMAL LOW (ref 3.5–5.1)
Sodium: 145 mmol/L (ref 135–145)

## 2020-02-07 NOTE — Progress Notes (Signed)
Thank you for this consult-   I have reviewed chart- noted I met with this patient in April of this year-  At that time a MOST form was completed via [employer] (which can be accessed by clicking on the "ACP: Has Documents" link under the patient's picture on the left side of the main screen of the patient's chart) with the following care planning:   -DNR  -Limited medical interventions (use all medical treatment, IV fluids, cardiac monitoring as indicated. Do not use intubation or mechanical ventilation. May consider less invasive airway support such as BiPAP or CPAP. Avoid intensive care -Antibiotics if indicated -IV fluids if indicated -Feeding tube for a defined trial period  I have called patient's daughter- [name]. Left message requesting return call and will meet for follow-up and review of MOST form and goals of care.   Mariana Kaufman, AGNP-C Palliative Medicine  Please call Palliative Medicine team phone with any questions [phone]. For individual providers please see AMION.  No charge

## 2020-02-07 NOTE — Progress Notes (Signed)
PROGRESS NOTE  Amy Hoffman FS:[id number] DOB: 12-07-1940 DOA: 02/05/2020 PCP: Doree Albee, MD  Brief History:  79 year old female with a history of dementia, diabetes mellitus type 2, hypertension, hyperlipidemia, asthma, right-sided breast cancer presenting with shortness of breath that was noticed by her family friends during a home visit.  Unfortunately, the patient is unable to provide any history at this time secondary to her dementia and encephalopathy.  In addition, there was concern for the patient's increased somnolence and drowsiness.  As result, the patient was brought to the emergency department for further evaluation.  In the ED, the patient remained somewhat drowsy with oxygen saturation in the 60% range.  The patient was placed on 5 L nasal cannula with oxygen saturation up to 98%.  Chest x-ray showed bilateral patchy infiltrates and increased interstitial markings.  WBC was 8.5.  BMP showed a potassium 3.0 with serum creatinine 0.70.  Notably, the patient was recently mated to the hospital from 12/16/2019 to 12/22/2019 secondary to respiratory failure from asthma exacerbation.  She also had atrial fibrillation with RVR at that time and was started on amiodarone and apixaban.  The patient was discharged to skilled nursing facility, [facility], where she stayed until up to 2 weeks ago when she returned home.  Assessment/Plan: Acute respiratory failure with hypoxia and hypercarbia -Secondary to pulmonary edema, CHF -5/24--7.37/60/134/32 (0.4) -Currently stable on 2 L, trending down -Wean oxygen for saturation greater than [id number]  Acute metabolic encephalopathy -Unable to reach family to clarify the patient's baseline function and mental status--I have tried to call the patient's daughter and son without answer this morning -She is alert and oriented x1 at the time of my evaluation -Repeat ABG 7.413/60/150/35 on 4L -Serum B12--725 -TSH--1.503 -UA negative for  pyuria -Ammonia--38 -folate 6.2  Acute diastolic CHF -Continue furosemide 20 mg IV twice daily -The patient remains clinically fluid overloaded -Has good urine output with IV Lasix -Daily weights -Accurate I's and O's -12/27/2019 echo EF 50-55%, no WMA, trivial MR  HCAP -CT chest--extensive bilateral upper lobe airspace opacities, right greater than left. New small right pleural effusion and moderate left pleural effusion  -PCT<0.10 -MRSA screen--neg -continue cefepime -d/c vanco  Atrial flutter, type unspecified -Currently in sinus rhythm -Continue apixaban -Continue amiodarone  Essential hypertension -Continue amlodipine -Anticipate improvement with diuresis  Dementia with behavioral disturbance -Continue Aricept -Hold respiratory for now as the patient is somewhat sleepy and somnolent  Diabetes mellitus type 2 -12/16/2019 hemoglobin A1c 6.8 -NovoLog sliding scale -Holding Amaryl  Hyperlipidemia -Continue statin  Hypokalemia -Replete -Magnesium 1.8  Goals of care -Patient was seen by palliative medicine April 2021 -She was DNR at that time of discharge -Cannot reach family to verify presently -reconsult palliative for continued GOC  Status is: Inpatient  Remains inpatient appropriate because:Altered mental status and IV treatments appropriate due to intensity of illness or inability to take PO   Dispo: The patient is from: Home              Anticipated d/c is to: SNF              Anticipated d/c date is: 2 days              Patient currently is not medically stable to d/c.    Family Communication:   Family at bedside  Consultants: Palliative care  Code Status:  FULL / DNR  DVT Prophylaxis:  [place] Heparin / [place] Lovenox  Procedures: As Listed in Progress Note Above  Antibiotics: vanco 5/24>> Cefepime 5/24>>   Subjective: Patient wakes up to voice, she knows she is in the hospital but does not know the date or why she is in the  hospital.  Objective: Vitals:   02/07/20 0600 02/07/20 0946 02/07/20 1515 02/07/20 1937  BP: (!) 135/56 (!) 157/51 (!) 143/67   Pulse: (!) 58 64 69   Resp: 16 18 18    Temp: 98.2 F (36.8 C)  98.3 F (36.8 C)   TempSrc: Oral  Oral   SpO2: 100% 99% 99% 99%  Weight:      Height:        Intake/Output Summary (Last 24 hours) at 02/07/2020 2116 Last data filed at 02/07/2020 1700 Gross per 24 hour  Intake 363 ml  Output 2800 ml  Net -2437 ml   Weight change:  Exam:  General exam: Alert, awake, no distress Respiratory system: Clear to auscultation.  Increased work of breathing Cardiovascular system:RRR. No murmurs, rubs, gallops. Gastrointestinal system: Abdomen is nondistended, soft and nontender. No organomegaly or masses felt. Normal bowel sounds heard. Central nervous system: No focal neurological deficits. Extremities: 1+ edema bilaterally Skin: No rashes, lesions or ulcers  Psychiatry: Confused.  Oriented x1     Data Reviewed: I have personally reviewed following labs and imaging studies Basic Metabolic Panel: Recent Labs  Lab 02/05/20 1124 02/05/20 1126 02/06/20 0533 02/07/20 0539  NA 143  --  145 145  K 3.0*  --  3.3* 3.1*  CL 99  --  100 92*  CO2 32  --  35* 43*  GLUCOSE 285*  --  194* 159*  BUN 20  --  13 11  CREATININE 0.70  --  0.54 0.54  CALCIUM 8.3*  --  7.8* 7.9*  MG  --  2.2 1.8  --    Liver Function Tests: No results for input(s): AST, ALT, ALKPHOS, BILITOT, PROT, ALBUMIN in the last 168 hours. No results for input(s): LIPASE, AMYLASE in the last 168 hours. Recent Labs  Lab 02/06/20 0758  AMMONIA 38*   Coagulation Profile: Recent Labs  Lab 02/05/20 1234  INR 1.9*   CBC: Recent Labs  Lab 02/05/20 1124 02/06/20 0533 02/07/20 0539  WBC 8.5 8.0 9.3  HGB 9.8* 9.5* 10.1*  HCT 32.4* 32.2* 33.1*  MCV 94.7 95.8 96.5  PLT 344 329 333   Cardiac Enzymes: No results for input(s): CKTOTAL, CKMB, CKMBINDEX, TROPONINI in the last 168  hours. BNP: Invalid input(s): POCBNP CBG: Recent Labs  Lab 02/06/20 1615 02/06/20 2119 02/07/20 0745 02/07/20 1156 02/07/20 1716  GLUCAP 169* 202* 133* 162* 78   HbA1C: No results for input(s): HGBA1C in the last 72 hours. Urine analysis:    Component Value Date/Time   COLORURINE YELLOW 02/05/2020 1147   APPEARANCEUR CLEAR 02/05/2020 1147   LABSPEC 1.017 02/05/2020 1147   PHURINE 6.0 02/05/2020 1147   GLUCOSEU >=500 (A) 02/05/2020 1147   HGBUR NEGATIVE 02/05/2020 1147   BILIRUBINUR NEGATIVE 02/05/2020 1147   KETONESUR NEGATIVE 02/05/2020 1147   PROTEINUR 30 (A) 02/05/2020 1147   UROBILINOGEN 0.2 01/23/2013 2317   NITRITE NEGATIVE 02/05/2020 1147   LEUKOCYTESUR NEGATIVE 02/05/2020 1147   Sepsis Labs: @LABRCNTIP (procalcitonin:4,lacticidven:4) ) Recent Results (from the past 240 hour(s))  SARS Coronavirus 2 by RT PCR (hospital order, performed in [place] hospital lab) Nasopharyngeal Nasopharyngeal Swab     Status: None   Collection Time: 02/05/20 12:13 PM   Specimen: Nasopharyngeal Swab  Result  Value Ref Range Status   SARS Coronavirus 2 NEGATIVE NEGATIVE Final    Comment: (NOTE) SARS-CoV-2 target nucleic acids are NOT DETECTED. The SARS-CoV-2 RNA is generally detectable in upper and lower respiratory specimens during the acute phase of infection. The lowest concentration of SARS-CoV-2 viral copies this assay can detect is 250 copies / mL. A negative result does not preclude SARS-CoV-2 infection and should not be used as the sole basis for treatment or other patient management decisions.  A negative result may occur with improper specimen collection / handling, submission of specimen other than nasopharyngeal swab, presence of viral mutation(s) within the areas targeted by this assay, and inadequate number of viral copies (<250 copies / mL). A negative result must be combined with clinical observations, patient history, and epidemiological information. Fact Sheet  for Patients:   StrictlyIdeas.no Fact Sheet for Healthcare Providers: BankingDealers.co.za This test is not yet approved or cleared  by the [place] FDA and has been authorized for detection and/or diagnosis of SARS-CoV-2 by FDA under an Emergency Use Authorization (EUA).  This EUA will remain in effect (meaning this test can be used) for the duration of the COVID-19 declaration under Section 564(b)(1) of the Act, 21 U.S.C. section 360bbb-3(b)(1), unless the authorization is terminated or revoked sooner. Performed at [facility], [address], [place], [place] [address]   Blood Culture (routine x 2)     Status: None (Preliminary result)   Collection Time: 02/05/20 12:24 PM   Specimen: BLOOD  Result Value Ref Range Status   Specimen Description BLOOD RIGHT ANTECUBITAL  Final   Special Requests   Final    BOTTLES DRAWN AEROBIC AND ANAEROBIC Blood Culture adequate volume   Culture   Final    NO GROWTH 2 DAYS Performed at [facility], [address], [place], [place] [address]    Report Status PENDING  Incomplete  Blood Culture (routine x 2)     Status: None (Preliminary result)   Collection Time: 02/05/20 12:34 PM   Specimen: BLOOD  Result Value Ref Range Status   Specimen Description BLOOD LEFT ANTECUBITAL  Final   Special Requests   Final    BOTTLES DRAWN AEROBIC AND ANAEROBIC Blood Culture adequate volume   Culture   Final    NO GROWTH 2 DAYS Performed at [facility], [address] [place], [place] [address]    Report Status PENDING  Incomplete  MRSA PCR Screening     Status: None   Collection Time: 02/06/20 11:14 AM   Specimen: Nasal Mucosa; Nasopharyngeal  Result Value Ref Range Status   MRSA by PCR NEGATIVE NEGATIVE Final    Comment:        The GeneXpert MRSA Assay (FDA approved for NASAL specimens only), is one component of a comprehensive MRSA colonization surveillance program. It is not intended to diagnose  MRSA infection nor to guide or monitor treatment for MRSA infections. Performed at [facility], [address], [place], [place] [address]      Scheduled Meds: . amiodarone  200 mg Oral Daily  . amLODipine  5 mg Oral Daily  . anastrozole  1 mg Oral Daily  . apixaban  5 mg Oral BID  . donepezil  5 mg Oral QHS  . furosemide  20 mg Intravenous Q12H  . insulin aspart  0-15 Units Subcutaneous TID WC  . insulin aspart  0-5 Units Subcutaneous QHS  . LORazepam  0.5 mg Intravenous Once  . risperiDONE  0.5 mg Oral BID  .  simvastatin  20 mg Oral QPM  . sodium chloride flush  3 mL Intravenous Q12H   Continuous Infusions: . sodium chloride 250 mL (02/06/20 1422)  . ceFEPime (MAXIPIME) IV 2 g (02/07/20 1307)    Procedures/Studies: CT CHEST WO CONTRAST  Result Date: 02/06/2020 CLINICAL DATA:  Worsening airspace disease. Dyspnea. Multifocal pneumonia. EXAM: CT CHEST WITHOUT CONTRAST TECHNIQUE: Multidetector CT imaging of the chest was performed following the standard protocol without IV contrast. COMPARISON:  02/04/2019 FINDINGS: Cardiovascular: Moderate cardiac enlargement. No pericardial effusion. Aortic atherosclerosis. RCA, LAD coronary artery atherosclerotic calcifications. Mediastinum/Nodes: Normal appearance of the thyroid gland. Filling defect within the trachea is identified, new from previous exam and likely representing aspirated debris or mucus. Normal appearance of the esophagus. No enlarged mediastinal adenopathy. Hilar structures are suboptimally evaluated due to lack of IV contrast material. Lungs/Pleura: New small right pleural effusion and moderate left pleural effusion noted with overlying areas of compressive type atelectasis. Interval development of extensive bilateral upper lobe airspace opacities, right greater than left. Imaging findings are concerning for multifocal pneumonia. Upper Abdomen: No acute findings identified within the imaged portions of the upper abdomen. Previous  cholecystectomy with chronic dilatation of the CBD. Musculoskeletal: Degenerative disc disease identified within the lower thoracic and visualized portions of the upper lumbar spine. IMPRESSION: 1. Interval development of extensive bilateral upper lobe airspace opacities, right greater than left. Findings are concerning for multifocal pneumonia. 2. New small right pleural effusion and moderate left pleural effusion with overlying areas of compressive type atelectasis. 3. Aortic atherosclerosis, in addition to RCA and LAD coronary artery disease. Please note that although the presence of coronary artery calcium documents the presence of coronary artery disease, the severity of this disease and any potential stenosis cannot be assessed on this non-gated CT examination. Assessment for potential risk factor modification, dietary therapy or pharmacologic therapy may be warranted, if clinically indicated. Aortic Atherosclerosis (ICD10-I70.0). Electronically Signed   By: Kerby Moors M.D.   On: 02/06/2020 12:21   DG Chest Port 1 View  Result Date: 02/05/2020 CLINICAL DATA:  Low O2 sats, altered mental status, hyperglycemia EXAM: PORTABLE CHEST 1 VIEW COMPARISON:  12/16/2019 FINDINGS: Patchy bilateral airspace opacities have progressed since prior study concerning for multifocal pneumonia. Mild cardiomegaly. Small right pleural effusion. No pneumothorax or acute bony abnormality. IMPRESSION: Worsening patchy bilateral airspace disease compatible with multifocal pneumonia. Small right effusion. Electronically Signed   By: Rolm Baptise M.D.   On: 02/05/2020 11:11    Kathie Dike, MD  [facility]  If 7PM-7AM, please contact night-coverage [email or web address] Password [facility] 02/07/2020, 9:16 PM   LOS: 2 days

## 2020-02-08 DIAGNOSIS — Z7189 Other specified counseling: Secondary | ICD-10-CM

## 2020-02-08 DIAGNOSIS — Z515 Encounter for palliative care: Secondary | ICD-10-CM

## 2020-02-08 DIAGNOSIS — Z66 Do not resuscitate: Secondary | ICD-10-CM

## 2020-02-08 LAB — GLUCOSE, CAPILLARY
Glucose-Capillary: 103 mg/dL — ABNORMAL HIGH (ref 70–99)
Glucose-Capillary: 68 mg/dL — ABNORMAL LOW (ref 70–99)
Glucose-Capillary: 109 mg/dL — ABNORMAL HIGH (ref 70–99)
Glucose-Capillary: 117 mg/dL — ABNORMAL HIGH (ref 70–99)
Glucose-Capillary: 107 mg/dL — ABNORMAL HIGH (ref 70–99)
Glucose-Capillary: 134 mg/dL — ABNORMAL HIGH (ref 70–99)

## 2020-02-08 LAB — CBC
HCT: 34.4 % — ABNORMAL LOW (ref 36.0–46.0)
Hemoglobin: 10.1 g/dL — ABNORMAL LOW (ref 12.0–15.0)
MCH: 28.9 pg (ref 26.0–34.0)
MCHC: 29.4 g/dL — ABNORMAL LOW (ref 30.0–36.0)
MCV: 98.6 fL (ref 80.0–100.0)
Platelets: 299 10*3/uL (ref 150–400)
RBC: 3.49 MIL/uL — ABNORMAL LOW (ref 3.87–5.11)
RDW: 14.3 % (ref 11.5–15.5)
WBC: 7.6 10*3/uL (ref 4.0–10.5)
nRBC: 0 % (ref 0.0–0.2)

## 2020-02-08 LAB — BASIC METABOLIC PANEL
Anion gap: 12 (ref 5–15)
BUN: 12 mg/dL (ref 8–23)
CO2: 46 mmol/L — ABNORMAL HIGH (ref 22–32)
Calcium: 7.9 mg/dL — ABNORMAL LOW (ref 8.9–10.3)
Chloride: 88 mmol/L — ABNORMAL LOW (ref 98–111)
Creatinine, Ser: 0.64 mg/dL (ref 0.44–1.00)
GFR calc Af Amer: >60 mL/min (ref >60)
GFR calc non Af Amer: >60 mL/min (ref >60)
Glucose, Bld: 113 mg/dL — ABNORMAL HIGH (ref 70–99)
Potassium: 3.2 mmol/L — ABNORMAL LOW (ref 3.5–5.1)
Sodium: 146 mmol/L — ABNORMAL HIGH (ref 135–145)

## 2020-02-08 MED ORDER — DEXTROSE 50 % IV SOLN
12.5000 g | INTRAVENOUS | Status: AC
  Administered 2020-02-08: 12.5 g via INTRAVENOUS

## 2020-02-08 MED ORDER — DEXTROSE 50 % IV SOLN
INTRAVENOUS | Status: AC
  Filled 2020-02-08: qty 50

## 2020-02-08 NOTE — Progress Notes (Signed)
Pt's daughter able to get pt to eat > 25% of her supper and drink entire cup of thickened water. Pt also took her daily meds crushed and mixed in with food. Tolerated well. Pt then took 1800 scheduled med with pudding and drank several sips of thickened water without difficulty or resistance.

## 2020-02-08 NOTE — Consult Note (Signed)
Consultation Note Date: 02/08/2020   Patient Name: Amy Hoffman  DOB: [birth year]  MRN: [id number]  Age / Sex: 79 y.o., female  PCP: Doree Albee, MD Referring Physician: Kathie Dike, MD  Reason for Consultation: Establishing goals of care  HPI/Patient Profile: 79 y.o. female  with past medical history of dementia, DM2, HTN, HLD, asthma, depression, remote breast cancer, admitted on 02/05/2020 with pneumonia- likely aspiration, CHF exacerbation with pulmonary edema, metabolic encephalopathy. She is not eating or drinking, not taking po medications- palliative medicine consulted for goals of care.   Clinical Assessment and Goals of Care: I examined patient and met with her daughter, [name] and spouseJoneen Boers. Of note- I met with them recently when patient was hospitalized in April. At that time a MOST was completed.  We reviewed patient's functional status prior to admission- she had been able to discharge home from rehab. Was ambulatory- able to go out to restaurants with friends and family. There had been some episodes of coughing when drinking or eating- but she had a good appetite. She required 24 hour care due to her dementia and this was being provided by friends of the family.  Dr. Roderic Palau was kindly present and discussed patient's current illness, comorbidities and possible trajectories.  I reviewed the MOST form that was completed on the last admission. DNR status was confirmed.  Dr. Roderic Palau discussed that [name] may be in a different place than she was on her last admission. He also discussed how each insult worsens her functional status, dementia, and chances for recovery. [name] noted that it has been a difficult year. [name] has been in constant decline.  I discussed with [name] and [name] what option of comfort care and Hospice would look like if patient continued to not show signs of improvement  over next few days.  At close of discussion- [name] and [name] remained hopeful that Amy Hoffman would improve to a point to be able to return home under their care. If she does not show signs of improvement- decreased oxygen needs, taking oral medications, eating and drinking- they are open to discussions of transitioning to comfort and would like to take [name] home with Hospice. They are familiar with Hospice services- [name]'s Grandmother received Hospice services and died at home.     Primary Decision Maker NEXT OF KIN- [name] is primary decision maker- but [name] is primary contact due to [name] has difficulty hearing    SUMMARY OF RECOMMENDATIONS -DNR -Continue IV antibiotics, fluids, attempt to titrate O2 down, ?SLP consult- can patient's diet be liberalized? -patient does not like pureed diet or thickened lemon water- she does enjoy diet cola -If patient improves- family would want to take her home with caregivers and would recommend Palliative followup at home -If she continues to decline- recommend attending revisit [place] discussion-  -PMT provider will return on Tuesday of next week    Code Status/Advance Care Planning:  DNR  Psycho-social/Spiritual:   Desire for further Chaplaincy support:yes  Prognosis:    Unable to determine  Discharge Planning: To Be Determined  Primary Diagnoses: Present on Admission: . Multifocal pneumonia . Acute respiratory failure with hypoxia ([place]) . Atrial fibrillation ([place]) . Community acquired pneumonia . Dementia with behavioral disturbance ([place]) . Essential (primary) hypertension . SOB (shortness of breath)   I have reviewed the medical record, interviewed the patient and family, and examined the patient. The following aspects are pertinent.  Past Medical History:  Diagnosis Date  . Abnormal uterine and vaginal bleeding, unspecified   . Allergic rhinitis due to pollen   . Anxiety   . Asthma   . Cancer [facility])    breast cancer - right   . Dementia ([place])   . Depression   . Depression   . Diabetes mellitus without complication ([place])   . Essential (primary) hypertension   . Hyperlipidemia, unspecified   . Hypertension   . Major depressive disorder, single episode, unspecified   . Obesity, unspecified   . Pain in joint involving shoulder region   . Reflux esophagitis   . Trigger finger, acquired   . Type 2 diabetes mellitus with hyperglycemia ([place])   . Unspecified asthma with (acute) exacerbation   . Unspecified asthma, uncomplicated   . 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 ([place])   . Unspecified osteoarthritis, unspecified site    Social History   Socioeconomic History  . Marital status: Married    Spouse name: Not on file  . Number of children: Not on file  . Years of education: Not on file  . Highest education level: Not on file  Occupational History  . Not on file  Tobacco Use  . Smoking status: Never Smoker  . Smokeless tobacco: Never Used  Substance and Sexual Activity  . Alcohol use: No  . Drug use: No  . Sexual activity: Not on file  Other Topics Concern  . Not on file  Social History Narrative  . Not on file   Social Determinants of Health   Financial Resource Strain:   . Difficulty of Paying Living Expenses:   Food Insecurity:   . Worried About Charity fundraiser in the Last Year:   . Arboriculturist in the Last Year:   Transportation Needs:   . Film/video editor (Medical):   Marland Kitchen Lack of Transportation (Non-Medical):   Physical Activity:   . Days of Exercise per Week:   . Minutes of Exercise per Session:   Stress:   . Feeling of Stress :   Social Connections:   . Frequency of Communication with Friends and Family:   . Frequency of Social Gatherings with Friends and Family:   . Attends Religious Services:   . Active Member of Clubs or Organizations:   . Attends Archivist Meetings:   Marland Kitchen Marital Status:    Family History  Family history unknown: Yes    Scheduled Meds: . amiodarone  200 mg Oral Daily  . amLODipine  5 mg Oral Daily  . anastrozole  1 mg Oral Daily  . apixaban  5 mg Oral BID  . dextrose      . donepezil  5 mg Oral QHS  . furosemide  20 mg Intravenous Q12H  . insulin aspart  0-15 Units Subcutaneous TID WC  . insulin aspart  0-5 Units Subcutaneous QHS  . risperiDONE  0.5 mg Oral BID  . simvastatin  20 mg Oral QPM  . sodium chloride flush  3 mL Intravenous Q12H   Continuous Infusions: . sodium chloride 250 mL (02/06/20  1422)  . ceFEPime (MAXIPIME) IV 2 g (02/08/20 1514)   PRN Meds:.sodium chloride, acetaminophen **OR** acetaminophen, ondansetron **OR** ondansetron (ZOFRAN) IV, polyethylene glycol, sodium chloride flush Medications Prior to Admission:  Prior to Admission medications   Medication Sig Start Date End Date Taking? Authorizing Provider  amiodarone (PACERONE) 200 MG tablet Take 462m twice daily for 5 days, then 2036mtwice daily for 14 days, then 20045maily thereafter 12/22/19  Yes ShaManuella Ghaziratik D, DO  amLODipine (NORVASC) 5 MG tablet Take 5 mg by mouth daily.   Yes [provider]  anastrozole (ARIMIDEX) 1 MG tablet Take 1 mg by mouth daily.   Yes [provider]  apixaban (ELIQUIS) 5 MG TABS tablet Take 1 tablet (5 mg total) by mouth 2 (two) times daily. 12/22/19 02/05/20 Yes Shah, Pratik D, DO  donepezil (ARICEPT) 5 MG tablet Take 5 mg by mouth at bedtime.   Yes [provider]  glimepiride (AMARYL) 4 MG tablet Take 8 mg by mouth daily.    Yes [provider]  Multiple Vitamin (MULTIVITAMIN ADULT PO) Take 1 tablet by mouth daily.   Yes [provider]  risperiDONE (RISPERDAL) 0.5 MG tablet Take 0.5 mg by mouth 2 (two) times daily.   Yes [provider]  simvastatin (ZOCOR) 40 MG tablet Take 20 mg by mouth every evening.    Yes [provider]   Allergies  Allergen Reactions  . Codeine   . Zantac [Ranitidine Hcl]    Review of Systems  Unable to  perform ROS   Physical Exam Vitals and nursing note reviewed.  Constitutional:      Comments: Lethargic, frail  Skin:    Coloration: Skin is pale.  Neurological:     Mental Status: She is disoriented.     Vital Signs: BP (!) 187/69 (BP Location: Right Arm)   Pulse 67   Temp 97.8 F (36.6 C) (Oral)   Resp 18   Ht 5' 5" (1.651 m)   Wt 68 kg   SpO2 100%   BMI 24.96 kg/m  Pain Scale: PAINAD   Pain Score: 0-No pain   SpO2: SpO2: 100 % O2 Device:SpO2: 100 % O2 Flow Rate: .O2 Flow Rate (L/min): 4 L/min  IO: Intake/output summary:   Intake/Output Summary (Last 24 hours) at 02/08/2020 1610 Last data filed at 02/08/2020 1300 Gross per 24 hour  Intake 360 ml  Output 2450 ml  Net -2090 ml    LBM: Last BM Date: 02/07/20 Baseline Weight: Weight: 68 kg Most recent weight: Weight: 68 kg     Palliative Assessment/Data: PPS: 10%     Thank you for this consult. Palliative medicine will continue to follow and assist as needed.   Time In: 1300 Time Out: 1430 Time Total: 90 miniutes Greater than 50%  of this time was spent counseling and coordinating care related to the above assessment and plan.  Signed by: KasMariana KaufmanGNP-C Palliative Medicine    Please contact Palliative Medicine Team phone at [phone] questions and concerns.  For individual provider: See AmiShea Evans

## 2020-02-08 NOTE — Progress Notes (Signed)
Pharmacy Antibiotic Note  Amy Hoffman is a 79 y.o. female admitted on 02/05/2020 with pneumonia.  Pharmacy has been consulted for  cefepime  dosing.  Plan: Cefepime 2g IV q12h Monitor labs, c/s, and patient improvement.      Height: 5\' 5"  (165.1 cm) Weight: 68 kg (150 lb) IBW/kg (Calculated) : 57  Temp (24hrs), Avg:98.6 F (37 C), Min:98.3 F (36.8 C), Max:98.9 F (37.2 C)  Recent Labs  Lab 02/05/20 1124 02/05/20 1224 02/06/20 0533 02/07/20 0539 02/08/20 0517  WBC 8.5  --  8.0 9.3 7.6  CREATININE 0.70  --  0.54 0.54 0.64  LATICACIDVEN  --  1.0  --   --   --     Estimated Creatinine Clearance: 51.3 mL/min (by C-G formula based on SCr of 0.64 mg/dL).    Allergies  Allergen Reactions  . Codeine   . Zantac [Ranitidine Hcl]      Antimicrobials this admission: vancomycin  5/24 >> 5/25  cefepime 5/24 >>  azithromycin 5/24>> x1 dose in ED  Dose adjustments this admission: cefepime  Microbiology results: 5/24  [facility] x2: ngtd 5/24 Ucx: pending 5/34 MRSA PCR: neg   Thank you for allowing pharmacy to be a part of this patient's care.  Margot Ables, PharmD Clinical Pharmacist 02/08/2020 8:45 AM

## 2020-02-08 NOTE — Progress Notes (Signed)
Pt is refusing all po food and fluids. Clamps mouth shut and turns head away from staff. Unable to even perform oral care this am bc pt will not open mouth. MD Memon notified. Pt with hypoglycemic episode this am which responded well to IV dextrose injection. Last blood sugar was 106 mg/dl. Will continue to monitor for s/s  Hypoglycemia and attempt to get pt to take po.

## 2020-02-08 NOTE — Progress Notes (Addendum)
PROGRESS NOTE  Leonah Stobbs Alfred FS:[id number] DOB: October 13, 1940 DOA: 02/05/2020 PCP: Doree Albee, MD  Brief History:  79 year old female with a history of dementia, diabetes mellitus type 2, hypertension, hyperlipidemia, asthma, right-sided breast cancer presenting with shortness of breath that was noticed by her family friends during a home visit.  Unfortunately, the patient is unable to provide any history at this time secondary to her dementia and encephalopathy.  In addition, there was concern for the patient's increased somnolence and drowsiness.  As result, the patient was brought to the emergency department for further evaluation.  In the ED, the patient remained somewhat drowsy with oxygen saturation in the 60% range.  The patient was placed on 5 L nasal cannula with oxygen saturation up to 98%.  Chest x-ray showed bilateral patchy infiltrates and increased interstitial markings.  WBC was 8.5.  BMP showed a potassium 3.0 with serum creatinine 0.70.  Notably, the patient was recently mated to the hospital from 12/16/2019 to 12/22/2019 secondary to respiratory failure from asthma exacerbation.  She also had atrial fibrillation with RVR at that time and was started on amiodarone and apixaban.  The patient was discharged to skilled nursing facility, [facility], where she stayed until up to 2 weeks ago when she returned home.  Assessment/Plan: Acute respiratory failure with hypoxia and hypercarbia -Secondary to pulmonary edema, CHF -5/24--7.37/60/134/32 (0.4) -Oxygen requirements have trended up to 5 L today -Wean oxygen for saturation greater than [id number]  Acute metabolic encephalopathy -Unable to reach family to clarify the patient's baseline function and mental status--I have tried to call the patient's daughter and son without answer this morning -She is lethargic during my evaluation today -Repeat ABG 7.413/60/150/35 on 4L -Serum B12--725 -TSH--1.503 -UA negative for pyuria -Ammonia--38  -folate 6.2  Acute diastolic CHF -Continue furosemide 20 mg IV twice daily -The patient remains clinically fluid overloaded -Has good urine output with IV Lasix -Daily weights -Accurate I's and O's -12/27/2019 echo EF 50-55%, no WMA, trivial MR  HCAP -CT chest--extensive bilateral upper lobe airspace opacities, right greater than left. New small right pleural effusion and moderate left pleural effusion  -PCT<0.10 -MRSA screen--neg -continue cefepime -We will request speech therapy input to evaluate swallowing and advance diet if appropriate.  Paroxysmal Atrial fibrillation -Currently in sinus rhythm -Continue apixaban -Continue amiodarone  Essential hypertension -Continue amlodipine -Anticipate improvement with diuresis  Dementia with behavioral disturbance -Continue Aricept  Diabetes mellitus type 2 -12/16/2019 hemoglobin A1c 6.8 -NovoLog sliding scale -Holding Amaryl  Hyperlipidemia -Continue statin  Hypokalemia -Replete -Magnesium 1.8  Goals of care -Seen by palliative care and DNR concurrent -Family wishes to continue with current treatments -If she fails to make meaningful improvements, they want to discharge her home with hospice services.  Status is: Inpatient  Remains inpatient appropriate because:Altered mental status and IV treatments appropriate due to intensity of illness or inability to take PO.  Depending on patient's progress, may need to consider transition to comfort measures if she does not make any meaningful improvement.   Dispo: The patient is from: Home              Anticipated d/c is to: SNF              Anticipated d/c date is: 2 days              Patient currently is not medically stable to d/c.    Family Communication:   Family at bedside.  Discussed with daughter and husband  Consultants: Palliative care  Code Status:  DNR  DVT Prophylaxis: Apixaban   Procedures: As Listed in Progress Note Above  Antibiotics: vanco  5/24>>5/26 Cefepime 5/24>>   Subjective: Patient has been more somnolent today.  P.o. intake has been poor.  She is refusing p.o. intake/medications earlier.  She appears to be better with p.o. intake with daughters present.  Objective: Vitals:   02/08/20 0719 02/08/20 1455 02/08/20 2000 02/08/20 2028  BP: (!) 153/71 (!) 187/69  (!) 131/53  Pulse: 63 67  76  Resp: 18 18  18   Temp:  97.8 F (36.6 C)  98.6 F (37 C)  TempSrc:  Oral  Axillary  SpO2: 100% 100% 96% 98%  Weight:      Height:        Intake/Output Summary (Last 24 hours) at 02/08/2020 2057 Last data filed at 02/08/2020 1900 Gross per 24 hour  Intake 486 ml  Output 2351 ml  Net -1865 ml   Weight change:  Exam:  General exam: Lethargic, no distress Respiratory system: Clear to auscultation.  Mild increase in respiratory effort Cardiovascular system:RRR. No murmurs, rubs, gallops. Gastrointestinal system: Abdomen is nondistended, soft and nontender. No organomegaly or masses felt. Normal bowel sounds heard. Central nervous system: No focal neurological deficits. Extremities: 1+ edema bilaterally Skin: No rashes, lesions or ulcers Psychiatry: Somnolent, briefly wakes up to voice but falls back asleep   Data Reviewed: I have personally reviewed following labs and imaging studies Basic Metabolic Panel: Recent Labs  Lab 02/05/20 1124 02/05/20 1126 02/06/20 0533 02/07/20 0539 02/08/20 0517  NA 143  --  145 145 146*  K 3.0*  --  3.3* 3.1* 3.2*  CL 99  --  100 92* 88*  CO2 32  --  35* 43* 46*  GLUCOSE 285*  --  194* 159* 113*  BUN 20  --  13 11 12   CREATININE 0.70  --  0.54 0.54 0.64  CALCIUM 8.3*  --  7.8* 7.9* 7.9*  MG  --  2.2 1.8  --   --    Liver Function Tests: No results for input(s): AST, ALT, ALKPHOS, BILITOT, PROT, ALBUMIN in the last 168 hours. No results for input(s): LIPASE, AMYLASE in the last 168 hours. Recent Labs  Lab 02/06/20 0758  AMMONIA 38*   Coagulation Profile: Recent Labs   Lab 02/05/20 1234  INR 1.9*   CBC: Recent Labs  Lab 02/05/20 1124 02/06/20 0533 02/07/20 0539 02/08/20 0517  WBC 8.5 8.0 9.3 7.6  HGB 9.8* 9.5* 10.1* 10.1*  HCT 32.4* 32.2* 33.1* 34.4*  MCV 94.7 95.8 96.5 98.6  PLT 344 329 333 299   Cardiac Enzymes: No results for input(s): CKTOTAL, CKMB, CKMBINDEX, TROPONINI in the last 168 hours. BNP: Invalid input(s): POCBNP CBG: Recent Labs  Lab 02/08/20 0722 02/08/20 0847 02/08/20 1117 02/08/20 1215 02/08/20 1631  GLUCAP 103* 109* 117* 107* 134*   HbA1C: No results for input(s): HGBA1C in the last 72 hours. Urine analysis:    Component Value Date/Time   COLORURINE YELLOW 02/05/2020 1147   APPEARANCEUR CLEAR 02/05/2020 1147   LABSPEC 1.017 02/05/2020 1147   PHURINE 6.0 02/05/2020 1147   GLUCOSEU >=500 (A) 02/05/2020 1147   HGBUR NEGATIVE 02/05/2020 1147   BILIRUBINUR NEGATIVE 02/05/2020 1147   KETONESUR NEGATIVE 02/05/2020 1147   PROTEINUR 30 (A) 02/05/2020 1147   UROBILINOGEN 0.2 01/23/2013 2317   NITRITE NEGATIVE 02/05/2020 1147   LEUKOCYTESUR NEGATIVE 02/05/2020 1147   Sepsis  Labs: @LABRCNTIP (procalcitonin:4,lacticidven:4) ) Recent Results (from the past 240 hour(s))  SARS Coronavirus 2 by RT PCR (hospital order, performed in [facility] hospital lab) Nasopharyngeal Nasopharyngeal Swab     Status: None   Collection Time: 02/05/20 12:13 PM   Specimen: Nasopharyngeal Swab  Result Value Ref Range Status   SARS Coronavirus 2 NEGATIVE NEGATIVE Final    Comment: (NOTE) SARS-CoV-2 target nucleic acids are NOT DETECTED. The SARS-CoV-2 RNA is generally detectable in upper and lower respiratory specimens during the acute phase of infection. The lowest concentration of SARS-CoV-2 viral copies this assay can detect is 250 copies / mL. A negative result does not preclude SARS-CoV-2 infection and should not be used as the sole basis for treatment or other patient management decisions.  A negative result may occur with  improper specimen collection / handling, submission of specimen other than nasopharyngeal swab, presence of viral mutation(s) within the areas targeted by this assay, and inadequate number of viral copies (<250 copies / mL). A negative result must be combined with clinical observations, patient history, and epidemiological information. Fact Sheet for Patients:   StrictlyIdeas.no Fact Sheet for Healthcare Providers: BankingDealers.co.za This test is not yet approved or cleared  by the [place] FDA and has been authorized for detection and/or diagnosis of SARS-CoV-2 by FDA under an Emergency Use Authorization (EUA).  This EUA will remain in effect (meaning this test can be used) for the duration of the COVID-19 declaration under Section 564(b)(1) of the Act, 21 U.S.C. section 360bbb-3(b)(1), unless the authorization is terminated or revoked sooner. Performed at [facility], [address] [place], [place] [address]   Blood Culture (routine x 2)     Status: None (Preliminary result)   Collection Time: 02/05/20 12:24 PM   Specimen: BLOOD  Result Value Ref Range Status   Specimen Description BLOOD RIGHT ANTECUBITAL  Final   Special Requests   Final    BOTTLES DRAWN AEROBIC AND ANAEROBIC Blood Culture adequate volume   Culture   Final    NO GROWTH 3 DAYS Performed at [facility], [address] [place], [place] [address]    Report Status PENDING  Incomplete  Blood Culture (routine x 2)     Status: None (Preliminary result)   Collection Time: 02/05/20 12:34 PM   Specimen: BLOOD  Result Value Ref Range Status   Specimen Description BLOOD LEFT ANTECUBITAL  Final   Special Requests   Final    BOTTLES DRAWN AEROBIC AND ANAEROBIC Blood Culture adequate volume   Culture   Final    NO GROWTH 3 DAYS Performed at [facility], [address], [place], [place] [address]    Report Status PENDING  Incomplete  MRSA PCR Screening     Status:  None   Collection Time: 02/06/20 11:14 AM   Specimen: Nasal Mucosa; Nasopharyngeal  Result Value Ref Range Status   MRSA by PCR NEGATIVE NEGATIVE Final    Comment:        The GeneXpert MRSA Assay (FDA approved for NASAL specimens only), is one component of a comprehensive MRSA colonization surveillance program. It is not intended to diagnose MRSA infection nor to guide or monitor treatment for MRSA infections. Performed at [facility], [address], [place], [place] [address]      Scheduled Meds:  amiodarone  200 mg Oral Daily   amLODipine  5 mg Oral Daily   anastrozole  1 mg Oral Daily   apixaban  5 mg Oral BID   donepezil  5 mg Oral QHS   furosemide  20 mg Intravenous Q12H   insulin aspart  0-15 Units Subcutaneous TID WC   insulin aspart  0-5 Units Subcutaneous QHS   risperiDONE  0.5 mg Oral BID   simvastatin  20 mg Oral QPM   sodium chloride flush  3 mL Intravenous Q12H   Continuous Infusions:  sodium chloride 250 mL (02/06/20 1422)   ceFEPime (MAXIPIME) IV 2 g (02/08/20 1514)    Procedures/Studies: CT CHEST WO CONTRAST  Result Date: 02/06/2020 CLINICAL DATA:  Worsening airspace disease. Dyspnea. Multifocal pneumonia. EXAM: CT CHEST WITHOUT CONTRAST TECHNIQUE: Multidetector CT imaging of the chest was performed following the standard protocol without IV contrast. COMPARISON:  02/04/2019 FINDINGS: Cardiovascular: Moderate cardiac enlargement. No pericardial effusion. Aortic atherosclerosis. RCA, LAD coronary artery atherosclerotic calcifications. Mediastinum/Nodes: Normal appearance of the thyroid gland. Filling defect within the trachea is identified, new from previous exam and likely representing aspirated debris or mucus. Normal appearance of the esophagus. No enlarged mediastinal adenopathy. Hilar structures are suboptimally evaluated due to lack of IV contrast material. Lungs/Pleura: New small right pleural effusion and moderate left pleural effusion noted with  overlying areas of compressive type atelectasis. Interval development of extensive bilateral upper lobe airspace opacities, right greater than left. Imaging findings are concerning for multifocal pneumonia. Upper Abdomen: No acute findings identified within the imaged portions of the upper abdomen. Previous cholecystectomy with chronic dilatation of the CBD. Musculoskeletal: Degenerative disc disease identified within the lower thoracic and visualized portions of the upper lumbar spine. IMPRESSION: 1. Interval development of extensive bilateral upper lobe airspace opacities, right greater than left. Findings are concerning for multifocal pneumonia. 2. New small right pleural effusion and moderate left pleural effusion with overlying areas of compressive type atelectasis. 3. Aortic atherosclerosis, in addition to RCA and LAD coronary artery disease. Please note that although the presence of coronary artery calcium documents the presence of coronary artery disease, the severity of this disease and any potential stenosis cannot be assessed on this non-gated CT examination. Assessment for potential risk factor modification, dietary therapy or pharmacologic therapy may be warranted, if clinically indicated. Aortic Atherosclerosis (ICD10-I70.0). Electronically Signed   By: Kerby Moors M.D.   On: 02/06/2020 12:21   DG Chest Port 1 View  Result Date: 02/05/2020 CLINICAL DATA:  Low O2 sats, altered mental status, hyperglycemia EXAM: PORTABLE CHEST 1 VIEW COMPARISON:  12/16/2019 FINDINGS: Patchy bilateral airspace opacities have progressed since prior study concerning for multifocal pneumonia. Mild cardiomegaly. Small right pleural effusion. No pneumothorax or acute bony abnormality. IMPRESSION: Worsening patchy bilateral airspace disease compatible with multifocal pneumonia. Small right effusion. Electronically Signed   By: Rolm Baptise M.D.   On: 02/05/2020 11:11    Kathie Dike, MD  [facility]  If  7PM-7AM, please contact night-coverage [email or web address] Password [facility] 02/08/2020, 8:57 PM   LOS: 3 days

## 2020-02-09 LAB — BLOOD GAS, ARTERIAL
Acid-Base Excess: 25.9 mmol/L — ABNORMAL HIGH (ref 0.0–2.0)
Bicarbonate: 49.5 mmol/L — ABNORMAL HIGH (ref 20.0–28.0)
FIO2: 24
O2 Saturation: 90.3 %
Patient temperature: 36.8
pCO2 arterial: 63.4 mmHg — ABNORMAL HIGH (ref 32.0–48.0)
pH, Arterial: 7.522 — ABNORMAL HIGH (ref 7.350–7.450)
pO2, Arterial: 53.5 mmHg — ABNORMAL LOW (ref 83.0–108.0)

## 2020-02-09 LAB — COMPREHENSIVE METABOLIC PANEL
ALT: 18 U/L (ref 0–44)
AST: 12 U/L — ABNORMAL LOW (ref 15–41)
Albumin: 2.2 g/dL — ABNORMAL LOW (ref 3.5–5.0)
Alkaline Phosphatase: 72 U/L (ref 38–126)
Anion gap: 15 (ref 5–15)
BUN: 18 mg/dL (ref 8–23)
CO2: 44 mmol/L — ABNORMAL HIGH (ref 22–32)
Calcium: 7.8 mg/dL — ABNORMAL LOW (ref 8.9–10.3)
Chloride: 86 mmol/L — ABNORMAL LOW (ref 98–111)
Creatinine, Ser: 0.69 mg/dL (ref 0.44–1.00)
GFR calc Af Amer: >60 mL/min (ref >60)
GFR calc non Af Amer: >60 mL/min (ref >60)
Glucose, Bld: 206 mg/dL — ABNORMAL HIGH (ref 70–99)
Potassium: 2.8 mmol/L — ABNORMAL LOW (ref 3.5–5.1)
Sodium: 145 mmol/L (ref 135–145)
Total Bilirubin: 1.1 mg/dL (ref 0.3–1.2)
Total Protein: 5 g/dL — ABNORMAL LOW (ref 6.5–8.1)

## 2020-02-09 LAB — GLUCOSE, CAPILLARY
Glucose-Capillary: 214 mg/dL — ABNORMAL HIGH (ref 70–99)
Glucose-Capillary: 195 mg/dL — ABNORMAL HIGH (ref 70–99)
Glucose-Capillary: 133 mg/dL — ABNORMAL HIGH (ref 70–99)
Glucose-Capillary: 116 mg/dL — ABNORMAL HIGH (ref 70–99)
Glucose-Capillary: 169 mg/dL — ABNORMAL HIGH (ref 70–99)

## 2020-02-09 LAB — CBC
HCT: 35.4 % — ABNORMAL LOW (ref 36.0–46.0)
Hemoglobin: 10.4 g/dL — ABNORMAL LOW (ref 12.0–15.0)
MCH: 28.1 pg (ref 26.0–34.0)
MCHC: 29.4 g/dL — ABNORMAL LOW (ref 30.0–36.0)
MCV: 95.7 fL (ref 80.0–100.0)
Platelets: 284 10*3/uL (ref 150–400)
RBC: 3.7 MIL/uL — ABNORMAL LOW (ref 3.87–5.11)
RDW: 13.8 % (ref 11.5–15.5)
WBC: 7.9 10*3/uL (ref 4.0–10.5)
nRBC: 0 % (ref 0.0–0.2)

## 2020-02-09 LAB — MAGNESIUM: Magnesium: 1.8 mg/dL (ref 1.7–2.4)

## 2020-02-09 MED ORDER — QUETIAPINE FUMARATE 25 MG PO TABS
50.0000 mg | ORAL_TABLET | Freq: Every day | ORAL | Status: DC
  Administered 2020-02-09: 50 mg via ORAL
  Filled 2020-02-09: qty 2

## 2020-02-09 MED ORDER — POTASSIUM CHLORIDE 10 MEQ/100ML IV SOLN
10.0000 meq | INTRAVENOUS | Status: AC
  Administered 2020-02-09 (×6): 10 meq via INTRAVENOUS
  Filled 2020-02-09 (×2): qty 100

## 2020-02-09 MED ORDER — LORAZEPAM 2 MG/ML IJ SOLN
0.5000 mg | Freq: Once | INTRAMUSCULAR | Status: AC
  Administered 2020-02-09: 0.5 mg via INTRAVENOUS
  Filled 2020-02-09: qty 1

## 2020-02-09 MED ORDER — LORAZEPAM 2 MG/ML IJ SOLN
0.5000 mg | Freq: Four times a day (QID) | INTRAMUSCULAR | Status: DC | PRN
  Administered 2020-02-12 – 2020-02-13 (×2): 0.5 mg via INTRAVENOUS
  Filled 2020-02-09 (×2): qty 1

## 2020-02-09 MED ORDER — LORAZEPAM 2 MG/ML IJ SOLN
1.0000 mg | Freq: Four times a day (QID) | INTRAMUSCULAR | Status: DC | PRN
  Filled 2020-02-09: qty 1

## 2020-02-09 NOTE — Progress Notes (Signed)
PROGRESS NOTE  Adhya Dlugosz Pickrell [id number] DOB: 07-04-1941 DOA: 02/05/2020 PCP: Doree Albee, MD  Brief History:  79 year old female with a history of dementia, diabetes mellitus type 2, hypertension, hyperlipidemia, asthma, right-sided breast cancer presenting with shortness of breath that was noticed by her family friends during a home visit.  Unfortunately, the patient is unable to provide any history at this time secondary to her dementia and encephalopathy.  In addition, there was concern for the patient's increased somnolence and drowsiness.  As result, the patient was brought to the emergency department for further evaluation.  In the ED, the patient remained somewhat drowsy with oxygen saturation in the 60% range.  The patient was placed on 5 L nasal cannula with oxygen saturation up to 98%.  Chest x-ray showed bilateral patchy infiltrates and increased interstitial markings.  WBC was 8.5.  BMP showed a potassium 3.0 with serum creatinine 0.70.  Notably, the patient was recently mated to the hospital from 12/16/2019 to 12/22/2019 secondary to respiratory failure from asthma exacerbation.  She also had atrial fibrillation with RVR at that time and was started on amiodarone and apixaban.  The patient was discharged to skilled nursing facility, [facility], where she stayed until up to 2 weeks ago when she returned home.  Assessment/Plan: Acute respiratory failure with hypoxia and hypercarbia -Secondary to pulmonary edema, CHF -5/24--7.37/60/134/32 (0.4) -Oxygen requirements-down to 2 L today -Wean oxygen for saturation greater than [id number]  Acute metabolic encephalopathy -She is lethargic during my evaluation today -Repeat ABG 7.413/60/150/35 on 4L -Serum B12--725 -TSH--1.503 -UA negative for pyuria -Ammonia--38 -folate 6.2 -reCheck ABG  Acute diastolic CHF -Continue furosemide 20 mg IV twice daily -The patient remains clinically fluid overloaded -Has good urine output with IV  Lasix -Daily weights -Accurate I's and O's -12/27/2019 echo EF 50-55%, no WMA, trivial MR  HCAP -CT chest--extensive bilateral upper lobe airspace opacities, right greater than left. New small right pleural effusion and moderate left pleural effusion  -PCT<0.10 -MRSA screen--neg -continue cefepime -Speech therapy following to assess swallowing.  Paroxysmal Atrial fibrillation -Currently in sinus rhythm -Continue apixaban -Continue amiodarone  Essential hypertension -Continue amlodipine -Anticipate improvement with diuresis  Dementia with behavioral disturbance -Continue Aricept -We will give a trial of Seroquel nightly -Use Ativan as needed for agitation.  Diabetes mellitus type 2 -12/16/2019 hemoglobin A1c 6.8 -NovoLog sliding scale -Holding Amaryl  Hyperlipidemia -Continue statin  Hypokalemia -Replete -Magnesium 1.8  Goals of care -Seen by palliative care and DNR concurrent -Family wishes to continue with current treatments -If she fails to make meaningful improvements, they want to discharge her home with hospice services.  Status is: Inpatient  Remains inpatient appropriate because:Altered mental status and IV treatments appropriate due to intensity of illness or inability to take PO.  Depending on patient's progress, may need to consider transition to comfort measures if she does not make any meaningful improvement.   Dispo: The patient is from: Home              Anticipated d/c is to: SNF              Anticipated d/c date is: 2 days              Patient currently is not medically stable to d/c.    Family Communication:   Family at bedside.  Discussed with daughter  Consultants: Palliative care  Code Status:  DNR  DVT Prophylaxis: Apixaban   Procedures: As  Listed in Progress Note Above  Antibiotics: vanco 5/24>>5/26 Cefepime 5/24>>   Subjective: She is somnolent today.  She was agitated overnight and received Ativan  recently.  Objective: Vitals:   02/09/20 0612 02/09/20 0850 02/09/20 0851 02/09/20 1339  BP: (!) 168/61  (!) 162/64 (!) 109/39  Pulse: 80   73  Resp: 19   16  Temp:    98.6 F (37 C)  TempSrc:    Oral  SpO2: 92% 100%  100%  Weight:      Height:        Intake/Output Summary (Last 24 hours) at 02/09/2020 2113 Last data filed at 02/09/2020 1700 Gross per 24 hour  Intake 710.45 ml  Output 2150 ml  Net -1439.55 ml   Weight change:  Exam:  General exam: Somnolent, but wakes up to voice, keeps eyes closed during assessment. Respiratory system: Clear to auscultation. Respiratory effort normal. Cardiovascular system:RRR. No murmurs, rubs, gallops. Gastrointestinal system: Abdomen is nondistended, soft and nontender. No organomegaly or masses felt. Normal bowel sounds heard. Central nervous system: No focal neurological deficits. Extremities: 1+ edema bilaterally Skin: No rashes, lesions or ulcers  Psychiatry: Has some confusion, but pleasant.   Data Reviewed: I have personally reviewed following labs and imaging studies Basic Metabolic Panel: Recent Labs  Lab 02/05/20 1124 02/05/20 1126 02/06/20 0533 02/07/20 0539 02/08/20 0517 02/09/20 0830  NA 143  --  145 145 146* 145  K 3.0*  --  3.3* 3.1* 3.2* 2.8*  CL 99  --  100 92* 88* 86*  CO2 32  --  35* 43* 46* 44*  GLUCOSE 285*  --  194* 159* 113* 206*  BUN 20  --  13 11 12 18   CREATININE 0.70  --  0.54 0.54 0.64 0.69  CALCIUM 8.3*  --  7.8* 7.9* 7.9* 7.8*  MG  --  2.2 1.8  --   --  1.8   Liver Function Tests: Recent Labs  Lab 02/09/20 0830  AST 12*  ALT 18  ALKPHOS 72  BILITOT 1.1  PROT 5.0*  ALBUMIN 2.2*   No results for input(s): LIPASE, AMYLASE in the last 168 hours. Recent Labs  Lab 02/06/20 0758  AMMONIA 38*   Coagulation Profile: Recent Labs  Lab 02/05/20 1234  INR 1.9*   CBC: Recent Labs  Lab 02/05/20 1124 02/06/20 0533 02/07/20 0539 02/08/20 0517 02/09/20 0830  WBC 8.5 8.0 9.3 7.6 7.9   HGB 9.8* 9.5* 10.1* 10.1* 10.4*  HCT 32.4* 32.2* 33.1* 34.4* 35.4*  MCV 94.7 95.8 96.5 98.6 95.7  PLT 344 329 333 299 284   Cardiac Enzymes: No results for input(s): CKTOTAL, CKMB, CKMBINDEX, TROPONINI in the last 168 hours. BNP: Invalid input(s): POCBNP CBG: Recent Labs  Lab 02/08/20 1631 02/09/20 0617 02/09/20 0724 02/09/20 1118 02/09/20 1620  GLUCAP 134* 214* 195* 133* 116*   HbA1C: No results for input(s): HGBA1C in the last 72 hours. Urine analysis:    Component Value Date/Time   COLORURINE YELLOW 02/05/2020 1147   APPEARANCEUR CLEAR 02/05/2020 1147   LABSPEC 1.017 02/05/2020 1147   PHURINE 6.0 02/05/2020 1147   GLUCOSEU >=500 (A) 02/05/2020 1147   HGBUR NEGATIVE 02/05/2020 1147   BILIRUBINUR NEGATIVE 02/05/2020 1147   KETONESUR NEGATIVE 02/05/2020 1147   PROTEINUR 30 (A) 02/05/2020 1147   UROBILINOGEN 0.2 01/23/2013 2317   NITRITE NEGATIVE 02/05/2020 1147   LEUKOCYTESUR NEGATIVE 02/05/2020 1147   Sepsis Labs: @LABRCNTIP (procalcitonin:4,lacticidven:4) ) Recent Results (from the past 240 hour(s))  SARS Coronavirus  2 by RT PCR (hospital order, performed in [facility] hospital lab) Nasopharyngeal Nasopharyngeal Swab     Status: None   Collection Time: 02/05/20 12:13 PM   Specimen: Nasopharyngeal Swab  Result Value Ref Range Status   SARS Coronavirus 2 NEGATIVE NEGATIVE Final    Comment: (NOTE) SARS-CoV-2 target nucleic acids are NOT DETECTED. The SARS-CoV-2 RNA is generally detectable in upper and lower respiratory specimens during the acute phase of infection. The lowest concentration of SARS-CoV-2 viral copies this assay can detect is 250 copies / mL. A negative result does not preclude SARS-CoV-2 infection and should not be used as the sole basis for treatment or other patient management decisions.  A negative result may occur with improper specimen collection / handling, submission of specimen other than nasopharyngeal swab, presence of viral  mutation(s) within the areas targeted by this assay, and inadequate number of viral copies (<250 copies / mL). A negative result must be combined with clinical observations, patient history, and epidemiological information. Fact Sheet for Patients:   StrictlyIdeas.no Fact Sheet for Healthcare Providers: BankingDealers.co.za This test is not yet approved or cleared  by the [place] FDA and has been authorized for detection and/or diagnosis of SARS-CoV-2 by FDA under an Emergency Use Authorization (EUA).  This EUA will remain in effect (meaning this test can be used) for the duration of the COVID-19 declaration under Section 564(b)(1) of the Act, 21 U.S.C. section 360bbb-3(b)(1), unless the authorization is terminated or revoked sooner. Performed at [facility], [address] [place], [place] [address]   Blood Culture (routine x 2)     Status: None (Preliminary result)   Collection Time: 02/05/20 12:24 PM   Specimen: BLOOD  Result Value Ref Range Status   Specimen Description BLOOD RIGHT ANTECUBITAL  Final   Special Requests   Final    BOTTLES DRAWN AEROBIC AND ANAEROBIC Blood Culture adequate volume   Culture   Final    NO GROWTH 4 DAYS Performed at [facility], [address], [place], [place] [address]    Report Status PENDING  Incomplete  Blood Culture (routine x 2)     Status: None (Preliminary result)   Collection Time: 02/05/20 12:34 PM   Specimen: BLOOD  Result Value Ref Range Status   Specimen Description BLOOD LEFT ANTECUBITAL  Final   Special Requests   Final    BOTTLES DRAWN AEROBIC AND ANAEROBIC Blood Culture adequate volume   Culture   Final    NO GROWTH 4 DAYS Performed at [facility], [address] [place], [place] [address]    Report Status PENDING  Incomplete  MRSA PCR Screening     Status: None   Collection Time: 02/06/20 11:14 AM   Specimen: Nasal Mucosa; Nasopharyngeal  Result Value Ref Range Status    MRSA by PCR NEGATIVE NEGATIVE Final    Comment:        The GeneXpert MRSA Assay (FDA approved for NASAL specimens only), is one component of a comprehensive MRSA colonization surveillance program. It is not intended to diagnose MRSA infection nor to guide or monitor treatment for MRSA infections. Performed at [facility], [address], [place], [place] [address]      Scheduled Meds: . amiodarone  200 mg Oral Daily  . amLODipine  5 mg Oral Daily  . anastrozole  1 mg Oral Daily  . apixaban  5 mg Oral BID  . donepezil  5 mg Oral QHS  . furosemide  20 mg Intravenous Q12H  .  insulin aspart  0-15 Units Subcutaneous TID WC  . insulin aspart  0-5 Units Subcutaneous QHS  . QUEtiapine  50 mg Oral QHS  . risperiDONE  0.5 mg Oral BID  . simvastatin  20 mg Oral QPM  . sodium chloride flush  3 mL Intravenous Q12H   Continuous Infusions: . sodium chloride 250 mL (02/06/20 1422)  . ceFEPime (MAXIPIME) IV Stopped (02/09/20 1301)    Procedures/Studies: CT CHEST WO CONTRAST  Result Date: 02/06/2020 CLINICAL DATA:  Worsening airspace disease. Dyspnea. Multifocal pneumonia. EXAM: CT CHEST WITHOUT CONTRAST TECHNIQUE: Multidetector CT imaging of the chest was performed following the standard protocol without IV contrast. COMPARISON:  02/04/2019 FINDINGS: Cardiovascular: Moderate cardiac enlargement. No pericardial effusion. Aortic atherosclerosis. RCA, LAD coronary artery atherosclerotic calcifications. Mediastinum/Nodes: Normal appearance of the thyroid gland. Filling defect within the trachea is identified, new from previous exam and likely representing aspirated debris or mucus. Normal appearance of the esophagus. No enlarged mediastinal adenopathy. Hilar structures are suboptimally evaluated due to lack of IV contrast material. Lungs/Pleura: New small right pleural effusion and moderate left pleural effusion noted with overlying areas of compressive type atelectasis. Interval development of  extensive bilateral upper lobe airspace opacities, right greater than left. Imaging findings are concerning for multifocal pneumonia. Upper Abdomen: No acute findings identified within the imaged portions of the upper abdomen. Previous cholecystectomy with chronic dilatation of the CBD. Musculoskeletal: Degenerative disc disease identified within the lower thoracic and visualized portions of the upper lumbar spine. IMPRESSION: 1. Interval development of extensive bilateral upper lobe airspace opacities, right greater than left. Findings are concerning for multifocal pneumonia. 2. New small right pleural effusion and moderate left pleural effusion with overlying areas of compressive type atelectasis. 3. Aortic atherosclerosis, in addition to RCA and LAD coronary artery disease. Please note that although the presence of coronary artery calcium documents the presence of coronary artery disease, the severity of this disease and any potential stenosis cannot be assessed on this non-gated CT examination. Assessment for potential risk factor modification, dietary therapy or pharmacologic therapy may be warranted, if clinically indicated. Aortic Atherosclerosis (ICD10-I70.0). Electronically Signed   By: Kerby Moors M.D.   On: 02/06/2020 12:21   DG Chest Port 1 View  Result Date: 02/05/2020 CLINICAL DATA:  Low O2 sats, altered mental status, hyperglycemia EXAM: PORTABLE CHEST 1 VIEW COMPARISON:  12/16/2019 FINDINGS: Patchy bilateral airspace opacities have progressed since prior study concerning for multifocal pneumonia. Mild cardiomegaly. Small right pleural effusion. No pneumothorax or acute bony abnormality. IMPRESSION: Worsening patchy bilateral airspace disease compatible with multifocal pneumonia. Small right effusion. Electronically Signed   By: Rolm Baptise M.D.   On: 02/05/2020 11:11    Kathie Dike, MD  [facility]  If 7PM-7AM, please contact night-coverage [email or web address]  02/09/2020, 9:13  PM   LOS: 4 days

## 2020-02-09 NOTE — Evaluation (Signed)
Clinical/Bedside Swallow Evaluation Patient Details  Name: Amy Hoffman MRN: [id number] Date of Birth: 1940/11/29  Today's Date: 02/09/2020 Time: SLP Start Time (ACUTE ONLY): 1015 SLP Stop Time (ACUTE ONLY): 1034 SLP Time Calculation (min) (ACUTE ONLY): 19 min  Past Medical History:  Past Medical History:  Diagnosis Date  . Abnormal uterine and vaginal bleeding, unspecified   . Allergic rhinitis due to pollen   . Anxiety   . Asthma   . Cancer [facility])    breast cancer - right  . Dementia ([place])   . Depression   . Depression   . Diabetes mellitus without complication ([place])   . Essential (primary) hypertension   . Hyperlipidemia, unspecified   . Hypertension   . Major depressive disorder, single episode, unspecified   . Obesity, unspecified   . Pain in joint involving shoulder region   . Reflux esophagitis   . Trigger finger, acquired   . Type 2 diabetes mellitus with hyperglycemia ([place])   . Unspecified asthma with (acute) exacerbation   . Unspecified asthma, uncomplicated   . 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 ([place])   . Unspecified osteoarthritis, unspecified site    Past Surgical History:  Past Surgical History:  Procedure Laterality Date  . CHOLECYSTECTOMY    . MASTECTOMY Right   . RE-EXCISION OF BREAST LUMPECTOMY     HPI:  79 year old female with a history of dementia, diabetes mellitus type 2, hypertension, hyperlipidemia, asthma, right-sided breast cancer presenting with shortness of breath that was noticed by her family friends during a home visit.  Unfortunately, the patient is unable to provide any history at this time secondary to her dementia and encephalopathy.  In addition, there was concern for the patient's increased somnolence and drowsiness.  As result, the patient was brought to the emergency department for further evaluation.  In the ED, the patient remained somewhat drowsy with oxygen saturation in the 60% range.  The patient was placed on 5  L nasal cannula with oxygen saturation up to 98%.  Chest x-ray showed bilateral patchy infiltrates and increased interstitial markings.  WBC was 8.5.  BMP showed a potassium 3.0 with serum creatinine 0.70.  Notably, the patient was recently mated to the hospital from 12/16/2019 to 12/22/2019 secondary to respiratory failure from asthma exacerbation.  She also had atrial fibrillation with RVR at that time and was started on amiodarone and apixaban.  The patient was discharged to skilled nursing facility, [facility], where she stayed until up to 2 weeks ago when she returned home. Pt seen during this recent hospitalization by ST and was briefly on NTL but was d/c on a D3/thin diet with recommendation for meds whole with puree.    Assessment / Plan / Recommendation Clinical Impression  Clinical swallowing evaluation completed while Pt was sitting upright in bed; Pt was easily roused for PO trials and responded verbally to all questions asked by SLP however note Pt did keep her eyes closed for most of the evaluation. Pt consumed thin liquids via cup and straw and note occasional immediate coughing and inconsistent delayed throat clearing after the swallow. With trials of NTL Pt demonstrated improved oral control with no throat clearing or coughing observed. Pt consumed limited trials of regular textures (peanut butter crackers) with prolonged mastication and mild/mod oral residue after the swallow. Recommend upgrade Pt's diet to D2/fine chop and NECTAR thick liquids. Pt requires a feeder and verbal cues, Recommend liquid wash after every bite. Recommend meds to be administered whole with puree. Ice  chips are ok to be administered after oral care in between meals per free water protocol. ST will continue to follow for goals of care and diet tolerance, anticipate upgrade as mentation improves. SLP Visit Diagnosis: Dysphagia, unspecified (R13.10)    Aspiration Risk  Mild aspiration risk;Moderate aspiration risk    Diet  Recommendation Dysphagia 2 (Fine chop);Nectar-thick liquid   Liquid Administration via: Cup;Straw Medication Administration: Whole meds with puree Supervision: Staff to assist with self feeding;Full supervision/cueing for compensatory strategies Compensations: Minimize environmental distractions;Slow rate;Follow solids with liquid Postural Changes: Seated upright at 90 degrees;Remain upright for at least 30 minutes after po intake    Other  Recommendations Oral Care Recommendations: Oral care BID Other Recommendations: Order thickener from pharmacy   Follow up Recommendations 24 hour supervision/assistance;Skilled Nursing facility      Frequency and Duration min 1 x/week  1 week       Prognosis Prognosis for Safe Diet Advancement: Fair Barriers to Reach Goals: Cognitive deficits      Swallow Study   General Date of Onset: 02/05/20 HPI: 79 year old female with a history of dementia, diabetes mellitus type 2, hypertension, hyperlipidemia, asthma, right-sided breast cancer presenting with shortness of breath that was noticed by her family friends during a home visit.  Unfortunately, the patient is unable to provide any history at this time secondary to her dementia and encephalopathy.  In addition, there was concern for the patient's increased somnolence and drowsiness.  As result, the patient was brought to the emergency department for further evaluation.  In the ED, the patient remained somewhat drowsy with oxygen saturation in the 60% range.  The patient was placed on 5 L nasal cannula with oxygen saturation up to 98%.  Chest x-ray showed bilateral patchy infiltrates and increased interstitial markings.  WBC was 8.5.  BMP showed a potassium 3.0 with serum creatinine 0.70.  Notably, the patient was recently mated to the hospital from 12/16/2019 to 12/22/2019 secondary to respiratory failure from asthma exacerbation.  She also had atrial fibrillation with RVR at that time and was started on  amiodarone and apixaban.  The patient was discharged to skilled nursing facility, [facility], where she stayed until up to 2 weeks ago when she returned home. Pt seen during this recent hospitalization by ST and was briefly on NTL but was d/c on a D3/thin diet with recommendation for meds whole with puree.  Type of Study: Bedside Swallow Evaluation Previous Swallow Assessment: 12/18/2019 Diet Prior to this Study: Dysphagia 1 (puree);Honey-thick liquids Temperature Spikes Noted: No Respiratory Status: Room air History of Recent Intubation: No Behavior/Cognition: Cooperative;Pleasant mood;Lethargic/Drowsy Oral Cavity Assessment: Within Functional Limits Oral Care Completed by SLP: Recent completion by staff Oral Cavity - Dentition: Adequate natural dentition Vision: Functional for self-feeding Self-Feeding Abilities: Needs assist;Needs set up Patient Positioning: Upright in bed;Postural control adequate for testing Baseline Vocal Quality: Normal Volitional Cough: Strong Volitional Swallow: Able to elicit    Oral/Motor/Sensory Function Overall Oral Motor/Sensory Function: Within functional limits   Ice Chips Ice chips: Within functional limits   Thin Liquid Thin Liquid: Impaired Presentation: Straw;Cup Oral Phase Impairments: Reduced labial seal;Poor awareness of bolus Pharyngeal  Phase Impairments: Suspected delayed Swallow;Multiple swallows;Wet Vocal Quality;Throat Clearing - Delayed;Cough - Immediate    Nectar Thick Nectar Thick Liquid: Within functional limits   Honey Thick Honey Thick Liquid: Not tested   Puree Puree: Within functional limits   Solid     Solid: Impaired Oral Phase Impairments: Reduced lingual movement/coordination Oral Phase Functional Implications: Oral  residue Pharyngeal Phase Impairments: Multiple swallows     Sammie Schermerhorn H. Roddie Mc, [place] Speech Language Pathologist  Wende Bushy 02/09/2020,10:41 AM

## 2020-02-09 NOTE — Care Management Important Message (Signed)
Important Message  Patient Details  Name: Amy Hoffman MRN: [id number] Date of Birth: 1941/01/15   Medicare Important Message Given:  Yes     Tommy Medal 02/09/2020, 1:57 PM

## 2020-02-09 NOTE — Plan of Care (Signed)
  Problem: SLP Dysphagia Goals Goal: Patient will utilize recommended strategies Description: Patient will utilize recommended strategies during swallow to increase swallowing safety with Flowsheets (Taken 02/09/2020 1044) Patient will utilize recommended strategies during swallow to increase swallowing safety with: mod assist

## 2020-02-09 NOTE — Plan of Care (Signed)

## 2020-02-10 LAB — COMPREHENSIVE METABOLIC PANEL
ALT: 16 U/L (ref 0–44)
AST: 11 U/L — ABNORMAL LOW (ref 15–41)
Albumin: 2.1 g/dL — ABNORMAL LOW (ref 3.5–5.0)
Alkaline Phosphatase: 65 U/L (ref 38–126)
Anion gap: 12 (ref 5–15)
BUN: 19 mg/dL (ref 8–23)
CO2: 41 mmol/L — ABNORMAL HIGH (ref 22–32)
Calcium: 7.9 mg/dL — ABNORMAL LOW (ref 8.9–10.3)
Chloride: 90 mmol/L — ABNORMAL LOW (ref 98–111)
Creatinine, Ser: 0.7 mg/dL (ref 0.44–1.00)
GFR calc Af Amer: >60 mL/min (ref >60)
GFR calc non Af Amer: >60 mL/min (ref >60)
Glucose, Bld: 118 mg/dL — ABNORMAL HIGH (ref 70–99)
Potassium: 3.1 mmol/L — ABNORMAL LOW (ref 3.5–5.1)
Sodium: 143 mmol/L (ref 135–145)
Total Bilirubin: 1 mg/dL (ref 0.3–1.2)
Total Protein: 4.8 g/dL — ABNORMAL LOW (ref 6.5–8.1)

## 2020-02-10 LAB — CULTURE, BLOOD (ROUTINE X 2)
Culture: NO GROWTH
Culture: NO GROWTH
Special Requests: ADEQUATE
Special Requests: ADEQUATE

## 2020-02-10 LAB — GLUCOSE, CAPILLARY
Glucose-Capillary: 117 mg/dL — ABNORMAL HIGH (ref 70–99)
Glucose-Capillary: 126 mg/dL — ABNORMAL HIGH (ref 70–99)
Glucose-Capillary: 86 mg/dL (ref 70–99)
Glucose-Capillary: 210 mg/dL — ABNORMAL HIGH (ref 70–99)

## 2020-02-10 MED ORDER — POTASSIUM CHLORIDE 10 MEQ/100ML IV SOLN
10.0000 meq | INTRAVENOUS | Status: AC
  Administered 2020-02-10 (×6): 10 meq via INTRAVENOUS
  Filled 2020-02-10 (×6): qty 100

## 2020-02-10 MED ORDER — QUETIAPINE FUMARATE 25 MG PO TABS
25.0000 mg | ORAL_TABLET | Freq: Every day | ORAL | Status: DC
  Administered 2020-02-10 – 2020-02-11 (×2): 25 mg via ORAL
  Filled 2020-02-10 (×2): qty 1

## 2020-02-10 MED ORDER — METOPROLOL TARTRATE 25 MG PO TABS
12.5000 mg | ORAL_TABLET | Freq: Two times a day (BID) | ORAL | Status: DC
  Administered 2020-02-10 – 2020-02-14 (×8): 12.5 mg via ORAL
  Filled 2020-02-10 (×8): qty 1

## 2020-02-10 NOTE — Progress Notes (Signed)
   02/10/20 1724  Assess: MEWS Score  ECG Heart Rate (!) 157  Assess: MEWS Score  MEWS Temp 0  MEWS Systolic 0  MEWS Pulse 3  MEWS RR 0  MEWS LOC 1  MEWS Score 4  MEWS Score Color Red  Assess: if the MEWS score is Yellow or Red  Were vital signs taken at a resting state? Yes  Focused Assessment Documented focused assessment  Early Detection of Sepsis Score *See Row Information* Low  MEWS guidelines implemented *See Row Information* Yes  Treat  MEWS Interventions Other (Comment) (took full set of vital signs made MD aware)  Take Vital Signs  Increase Vital Sign Frequency  Red: Q 1hr X 4 then Q 4hr X 4, if remains red, continue Q 4hrs  Escalate  MEWS: Escalate Red: discuss with charge nurse/RN and provider, consider discussing with RRT  Notify: Charge Nurse/RN  Name of Charge Nurse/RN Notified Santa Lighter RN  Date Charge Nurse/RN Notified 02/10/20  Time Charge Nurse/RN Notified 1739  Notify: Provider  Provider Name/Title Memon  Date Provider Notified 02/10/20  Time Provider Notified 1740  Notification Type Face-to-face  Notification Reason Change in status  Response No new orders  Date of Provider Response 02/10/20  Time of Provider Response [phone]

## 2020-02-10 NOTE — Progress Notes (Signed)
PROGRESS NOTE  Amy Hoffman FS:[id number] DOB: 02/08/1941 DOA: 02/05/2020 PCP: Doree Albee, MD  Brief History:  79 year old female with a history of dementia, diabetes mellitus type 2, hypertension, hyperlipidemia, asthma, right-sided breast cancer presenting with shortness of breath that was noticed by her family friends during a home visit.  Unfortunately, the patient is unable to provide any history at this time secondary to her dementia and encephalopathy.  In addition, there was concern for the patient's increased somnolence and drowsiness.  As result, the patient was brought to the emergency department for further evaluation.  In the ED, the patient remained somewhat drowsy with oxygen saturation in the 60% range.  The patient was placed on 5 L nasal cannula with oxygen saturation up to 98%.  Chest x-ray showed bilateral patchy infiltrates and increased interstitial markings.  WBC was 8.5.  BMP showed a potassium 3.0 with serum creatinine 0.70.  Notably, the patient was recently mated to the hospital from 12/16/2019 to 12/22/2019 secondary to respiratory failure from asthma exacerbation.  She also had atrial fibrillation with RVR at that time and was started on amiodarone and apixaban.  The patient was discharged to skilled nursing facility, [facility], where she stayed until up to 2 weeks ago when she returned home.  Assessment/Plan: Acute respiratory failure with hypoxia and hypercarbia -Secondary to pulmonary edema, CHF -5/24--7.37/60/134/32 (0.4) -Oxygen requirements-down to 2 L today -Wean oxygen for saturation greater than [id number]  Acute metabolic encephalopathy -Repeat ABG 7.413/60/150/35 on 4L -Serum B12--725 -TSH--1.503 -UA negative for pyuria -Ammonia--38 -folate 6.2 -Overall mental status appears to be improving  Acute diastolic CHF -Continue furosemide 20 mg IV twice daily -Overall volume status is better -Recheck chest x-ray in a.m. -Daily weights -Accurate I's  and O's -12/27/2019 echo EF 50-55%, no WMA, trivial MR  HCAP -CT chest--extensive bilateral upper lobe airspace opacities, right greater than left. New small right pleural effusion and moderate left pleural effusion  -PCT<0.10 -MRSA screen--neg -continue cefepime -Speech therapy following to assess swallowing.  Paroxysmal Atrial fibrillation -Currently in sinus rhythm -Continue apixaban -Continue amiodarone -She did have an episode of transient atrial fibrillation with RVR. -We will start on low-dose Lopressor  Essential hypertension -Continue amlodipine -Anticipate improvement with diuresis  Dementia with behavioral disturbance -Continue Aricept -We will give a trial of Seroquel nightly -Use Ativan as needed for agitation.  Diabetes mellitus type 2 -12/16/2019 hemoglobin A1c 6.8 -NovoLog sliding scale -Holding Amaryl  Hyperlipidemia -Continue statin  Hypokalemia -Replete -Magnesium 1.8  Goals of care -Seen by palliative care and DNR concurrent -Family wishes to continue with current treatments -If she fails to make meaningful improvements, they want to discharge her home with hospice services.  Status is: Inpatient  Remains inpatient appropriate because:Altered mental status and IV treatments appropriate due to intensity of illness or inability to take PO.  Depending on patient's progress, may need to consider transition to comfort measures if she does not make any meaningful improvement.   Dispo: The patient is from: Home              Anticipated d/c is to: SNF              Anticipated d/c date is: 2 days              Patient currently is not medically stable to d/c.    Family Communication:   Family at bedside.  Discussed with husband  Consultants: Palliative care  Code Status:  DNR  DVT Prophylaxis: Apixaban   Procedures: As Listed in Progress Note Above  Antibiotics: vanco 5/24>>5/26 Cefepime 5/24>>   Subjective: She is more awake and  interactive today.  Denies any cough or shortness of breath.  Still requiring supplemental oxygen.  Objective: Vitals:   02/10/20 1433 02/10/20 1750 02/10/20 1751 02/10/20 1908  BP: (!) 161/66  (!) 125/53   Pulse: 83  82 82  Resp: 16   20  Temp: 98.6 F (37 C)  98.4 F (36.9 C)   TempSrc: Oral  Oral   SpO2: 95% (!) 70% 94% (!) 88%  Weight:      Height:        Intake/Output Summary (Last 24 hours) at 02/10/2020 2042 Last data filed at 02/10/2020 0300 Gross per 24 hour  Intake 100 ml  Output 500 ml  Net -400 ml   Weight change:  Exam:  General exam: Alert, awake, no distress Respiratory system: Clear to auscultation. Respiratory effort normal. Cardiovascular system:RRR. No murmurs, rubs, gallops. Gastrointestinal system: Abdomen is nondistended, soft and nontender. No organomegaly or masses felt. Normal bowel sounds heard. Central nervous system: No focal neurological deficits. Extremities: No C/C/E, +pedal pulses Skin: No rashes, lesions or ulcers  Psychiatry: Confused, pleasant   Data Reviewed: I have personally reviewed following labs and imaging studies Basic Metabolic Panel: Recent Labs  Lab 02/05/20 1124 02/05/20 1126 02/06/20 0533 02/07/20 0539 02/08/20 0517 02/09/20 0830 02/10/20 0559  NA   < >  --  145 145 146* 145 143  K   < >  --  3.3* 3.1* 3.2* 2.8* 3.1*  CL   < >  --  100 92* 88* 86* 90*  CO2   < >  --  35* 43* 46* 44* 41*  GLUCOSE   < >  --  194* 159* 113* 206* 118*  BUN   < >  --  13 11 12 18 19   CREATININE   < >  --  0.54 0.54 0.64 0.69 0.70  CALCIUM   < >  --  7.8* 7.9* 7.9* 7.8* 7.9*  MG  --  2.2 1.8  --   --  1.8  --    < > = values in this interval not displayed.   Liver Function Tests: Recent Labs  Lab 02/09/20 0830 02/10/20 0559  AST 12* 11*  ALT 18 16  ALKPHOS 72 65  BILITOT 1.1 1.0  PROT 5.0* 4.8*  ALBUMIN 2.2* 2.1*   No results for input(s): LIPASE, AMYLASE in the last 168 hours. Recent Labs  Lab 02/06/20 0758  AMMONIA  38*   Coagulation Profile: Recent Labs  Lab 02/05/20 1234  INR 1.9*   CBC: Recent Labs  Lab 02/05/20 1124 02/06/20 0533 02/07/20 0539 02/08/20 0517 02/09/20 0830  WBC 8.5 8.0 9.3 7.6 7.9  HGB 9.8* 9.5* 10.1* 10.1* 10.4*  HCT 32.4* 32.2* 33.1* 34.4* 35.4*  MCV 94.7 95.8 96.5 98.6 95.7  PLT 344 329 333 299 284   Cardiac Enzymes: No results for input(s): CKTOTAL, CKMB, CKMBINDEX, TROPONINI in the last 168 hours. BNP: Invalid input(s): POCBNP CBG: Recent Labs  Lab 02/09/20 1620 02/09/20 2119 02/10/20 0732 02/10/20 1059 02/10/20 1721  GLUCAP 116* 169* 117* 126* 86   HbA1C: No results for input(s): HGBA1C in the last 72 hours. Urine analysis:    Component Value Date/Time   COLORURINE YELLOW 02/05/2020 1147   APPEARANCEUR CLEAR 02/05/2020 1147   LABSPEC 1.017 02/05/2020 1147  PHURINE 6.0 02/05/2020 1147   GLUCOSEU >=500 (A) 02/05/2020 1147   HGBUR NEGATIVE 02/05/2020 1147   BILIRUBINUR NEGATIVE 02/05/2020 1147   KETONESUR NEGATIVE 02/05/2020 1147   PROTEINUR 30 (A) 02/05/2020 1147   UROBILINOGEN 0.2 01/23/2013 2317   NITRITE NEGATIVE 02/05/2020 1147   LEUKOCYTESUR NEGATIVE 02/05/2020 1147   Sepsis Labs: @LABRCNTIP (procalcitonin:4,lacticidven:4) ) Recent Results (from the past 240 hour(s))  SARS Coronavirus 2 by RT PCR (hospital order, performed in [place] hospital lab) Nasopharyngeal Nasopharyngeal Swab     Status: None   Collection Time: 02/05/20 12:13 PM   Specimen: Nasopharyngeal Swab  Result Value Ref Range Status   SARS Coronavirus 2 NEGATIVE NEGATIVE Final    Comment: (NOTE) SARS-CoV-2 target nucleic acids are NOT DETECTED. The SARS-CoV-2 RNA is generally detectable in upper and lower respiratory specimens during the acute phase of infection. The lowest concentration of SARS-CoV-2 viral copies this assay can detect is 250 copies / mL. A negative result does not preclude SARS-CoV-2 infection and should not be used as the sole basis for treatment  or other patient management decisions.  A negative result may occur with improper specimen collection / handling, submission of specimen other than nasopharyngeal swab, presence of viral mutation(s) within the areas targeted by this assay, and inadequate number of viral copies (<250 copies / mL). A negative result must be combined with clinical observations, patient history, and epidemiological information. Fact Sheet for Patients:   StrictlyIdeas.no Fact Sheet for Healthcare Providers: BankingDealers.co.za This test is not yet approved or cleared  by the [place] FDA and has been authorized for detection and/or diagnosis of SARS-CoV-2 by FDA under an Emergency Use Authorization (EUA).  This EUA will remain in effect (meaning this test can be used) for the duration of the COVID-19 declaration under Section 564(b)(1) of the Act, 21 U.S.C. section 360bbb-3(b)(1), unless the authorization is terminated or revoked sooner. Performed at [facility], [address], [place], [place] [address]   Blood Culture (routine x 2)     Status: None   Collection Time: 02/05/20 12:24 PM   Specimen: BLOOD  Result Value Ref Range Status   Specimen Description BLOOD RIGHT ANTECUBITAL  Final   Special Requests   Final    BOTTLES DRAWN AEROBIC AND ANAEROBIC Blood Culture adequate volume   Culture   Final    NO GROWTH 5 DAYS Performed at [facility], [address] [place], [place] [address]    Report Status 02/10/2020 FINAL  Final  Blood Culture (routine x 2)     Status: None   Collection Time: 02/05/20 12:34 PM   Specimen: BLOOD  Result Value Ref Range Status   Specimen Description BLOOD LEFT ANTECUBITAL  Final   Special Requests   Final    BOTTLES DRAWN AEROBIC AND ANAEROBIC Blood Culture adequate volume   Culture   Final    NO GROWTH 5 DAYS Performed at [facility], [address], [place],  [address]    Report Status 02/10/2020 FINAL   Final  MRSA PCR Screening     Status: None   Collection Time: 02/06/20 11:14 AM   Specimen: Nasal Mucosa; Nasopharyngeal  Result Value Ref Range Status   MRSA by PCR NEGATIVE NEGATIVE Final    Comment:        The GeneXpert MRSA Assay (FDA approved for NASAL specimens only), is one component of a comprehensive MRSA colonization surveillance program. It is not intended to diagnose MRSA infection nor to guide or monitor treatment  for MRSA infections. Performed at [facility], [address], [place], [place] [address]      Scheduled Meds: . amiodarone  200 mg Oral Daily  . amLODipine  5 mg Oral Daily  . anastrozole  1 mg Oral Daily  . apixaban  5 mg Oral BID  . donepezil  5 mg Oral QHS  . insulin aspart  0-15 Units Subcutaneous TID WC  . insulin aspart  0-5 Units Subcutaneous QHS  . metoprolol tartrate  12.5 mg Oral BID  . QUEtiapine  25 mg Oral QHS  . risperiDONE  0.5 mg Oral BID  . simvastatin  20 mg Oral QPM  . sodium chloride flush  3 mL Intravenous Q12H   Continuous Infusions: . sodium chloride 250 mL (02/06/20 1422)  . ceFEPime (MAXIPIME) IV 2 g (02/10/20 1313)  . potassium chloride 10 mEq (02/10/20 2033)    Procedures/Studies: CT CHEST WO CONTRAST  Result Date: 02/06/2020 CLINICAL DATA:  Worsening airspace disease. Dyspnea. Multifocal pneumonia. EXAM: CT CHEST WITHOUT CONTRAST TECHNIQUE: Multidetector CT imaging of the chest was performed following the standard protocol without IV contrast. COMPARISON:  02/04/2019 FINDINGS: Cardiovascular: Moderate cardiac enlargement. No pericardial effusion. Aortic atherosclerosis. RCA, LAD coronary artery atherosclerotic calcifications. Mediastinum/Nodes: Normal appearance of the thyroid gland. Filling defect within the trachea is identified, new from previous exam and likely representing aspirated debris or mucus. Normal appearance of the esophagus. No enlarged mediastinal adenopathy. Hilar structures are suboptimally evaluated due  to lack of IV contrast material. Lungs/Pleura: New small right pleural effusion and moderate left pleural effusion noted with overlying areas of compressive type atelectasis. Interval development of extensive bilateral upper lobe airspace opacities, right greater than left. Imaging findings are concerning for multifocal pneumonia. Upper Abdomen: No acute findings identified within the imaged portions of the upper abdomen. Previous cholecystectomy with chronic dilatation of the CBD. Musculoskeletal: Degenerative disc disease identified within the lower thoracic and visualized portions of the upper lumbar spine. IMPRESSION: 1. Interval development of extensive bilateral upper lobe airspace opacities, right greater than left. Findings are concerning for multifocal pneumonia. 2. New small right pleural effusion and moderate left pleural effusion with overlying areas of compressive type atelectasis. 3. Aortic atherosclerosis, in addition to RCA and LAD coronary artery disease. Please note that although the presence of coronary artery calcium documents the presence of coronary artery disease, the severity of this disease and any potential stenosis cannot be assessed on this non-gated CT examination. Assessment for potential risk factor modification, dietary therapy or pharmacologic therapy may be warranted, if clinically indicated. Aortic Atherosclerosis (ICD10-I70.0). Electronically Signed   By: Kerby Moors M.D.   On: 02/06/2020 12:21   DG Chest Port 1 View  Result Date: 02/05/2020 CLINICAL DATA:  Low O2 sats, altered mental status, hyperglycemia EXAM: PORTABLE CHEST 1 VIEW COMPARISON:  12/16/2019 FINDINGS: Patchy bilateral airspace opacities have progressed since prior study concerning for multifocal pneumonia. Mild cardiomegaly. Small right pleural effusion. No pneumothorax or acute bony abnormality. IMPRESSION: Worsening patchy bilateral airspace disease compatible with multifocal pneumonia. Small right  effusion. Electronically Signed   By: Rolm Baptise M.D.   On: 02/05/2020 11:11    Kathie Dike, MD  [facility]  If 7PM-7AM, please contact night-coverage [email or web address]  02/10/2020, 8:42 PM   LOS: 5 days

## 2020-02-11 ENCOUNTER — Inpatient Hospital Stay (HOSPITAL_COMMUNITY): Payer: Medicare Other

## 2020-02-11 LAB — BASIC METABOLIC PANEL
Anion gap: 9 (ref 5–15)
BUN: 20 mg/dL (ref 8–23)
CO2: 37 mmol/L — ABNORMAL HIGH (ref 22–32)
Calcium: 7.9 mg/dL — ABNORMAL LOW (ref 8.9–10.3)
Chloride: 98 mmol/L (ref 98–111)
Creatinine, Ser: 0.66 mg/dL (ref 0.44–1.00)
GFR calc Af Amer: >60 mL/min (ref >60)
GFR calc non Af Amer: >60 mL/min (ref >60)
Glucose, Bld: 183 mg/dL — ABNORMAL HIGH (ref 70–99)
Potassium: 4.1 mmol/L (ref 3.5–5.1)
Sodium: 144 mmol/L (ref 135–145)

## 2020-02-11 LAB — GLUCOSE, CAPILLARY
Glucose-Capillary: 148 mg/dL — ABNORMAL HIGH (ref 70–99)
Glucose-Capillary: 200 mg/dL — ABNORMAL HIGH (ref 70–99)
Glucose-Capillary: 105 mg/dL — ABNORMAL HIGH (ref 70–99)
Glucose-Capillary: 264 mg/dL — ABNORMAL HIGH (ref 70–99)

## 2020-02-11 MED ORDER — FUROSEMIDE 10 MG/ML IJ SOLN
20.0000 mg | Freq: Two times a day (BID) | INTRAMUSCULAR | Status: DC
  Administered 2020-02-11 – 2020-02-12 (×2): 20 mg via INTRAVENOUS
  Filled 2020-02-11 (×2): qty 2

## 2020-02-11 MED ORDER — ALBUMIN HUMAN 25 % IV SOLN
25.0000 g | Freq: Two times a day (BID) | INTRAVENOUS | Status: DC
  Administered 2020-02-11 – 2020-02-12 (×2): 25 g via INTRAVENOUS
  Filled 2020-02-11 (×2): qty 100

## 2020-02-11 NOTE — Progress Notes (Signed)
PROGRESS NOTE  Avani Ash Swann EQ:[id number] DOB: 1941-08-24 DOA: 02/05/2020 PCP: Doree Albee, MD  Brief History:  79 year old female with a history of dementia, diabetes mellitus type 2, hypertension, hyperlipidemia, asthma, right-sided breast cancer presenting with shortness of breath that was noticed by her family friends during a home visit.  Unfortunately, the patient is unable to provide any history at this time secondary to her dementia and encephalopathy.  In addition, there was concern for the patient's increased somnolence and drowsiness.  As result, the patient was brought to the emergency department for further evaluation.  In the ED, the patient remained somewhat drowsy with oxygen saturation in the 60% range.  The patient was placed on 5 L nasal cannula with oxygen saturation up to 98%.  Chest x-ray showed bilateral patchy infiltrates and increased interstitial markings.  WBC was 8.5.  BMP showed a potassium 3.0 with serum creatinine 0.70.  Notably, the patient was recently mated to the hospital from 12/16/2019 to 12/22/2019 secondary to respiratory failure from asthma exacerbation.  She also had atrial fibrillation with RVR at that time and was started on amiodarone and apixaban.  The patient was discharged to skilled nursing facility, [facility], where she stayed until up to 2 weeks ago when she returned home.  Assessment/Plan: Acute respiratory failure with hypoxia and hypercarbia -Secondary to pulmonary edema, CHF -5/24--7.37/60/134/32 (0.4) -Currently requiring 3 L of oxygen. -Wean oxygen for saturation greater than [id number]  Acute metabolic encephalopathy -Repeat ABG 7.413/60/150/35 on 4L -Serum B12--725 -TSH--1.503 -UA negative for pyuria -Ammonia--38 -folate 6.2 -She remains somnolent today which is not her baseline.  Acute diastolic CHF -Continue furosemide 20 mg IV twice daily -Add albumin infusions to aid in diuresis. -Overall volume status is better -Repeat  chest x-ray shows persistent effusions. -Daily weights -Accurate I's and O's -12/27/2019 echo EF 50-55%, no WMA, trivial MR  HCAP -CT chest--extensive bilateral upper lobe airspace opacities, right greater than left. New small right pleural effusion and moderate left pleural effusion  -PCT<0.10 -MRSA screen--neg -continue cefepime -Speech therapy following to assess swallowing.  Paroxysmal Atrial fibrillation -Currently in sinus rhythm -Continue apixaban -Continue amiodarone -She did have an episode of transient atrial fibrillation with RVR. -Continue Lopressor  Essential hypertension -Continue amlodipine -Anticipate improvement with diuresis  Dementia with behavioral disturbance -Continue Aricept -Discontinue Seroquel since she was somnolent today. -Use Ativan as needed for agitation.  Diabetes mellitus type 2 -12/16/2019 hemoglobin A1c 6.8 -NovoLog sliding scale -Holding Amaryl  Hyperlipidemia -Continue statin  Hypokalemia -Repleted -Magnesium 1.8  Goals of care -Seen by palliative care and DNR concurrent -Family wishes to continue with current treatments -If she fails to make meaningful improvements, they want to discharge her home with hospice services.  Status is: Inpatient  Remains inpatient appropriate because:Altered mental status and IV treatments appropriate due to intensity of illness or inability to take PO.  Depending on patient's progress, may need to consider transition to comfort measures if she does not make any meaningful improvement.   Dispo: The patient is from: Home              Anticipated d/c is to: SNF              Anticipated d/c date is: 2 days              Patient currently is not medically stable to d/c.    Family Communication:   No family present  Consultants: Palliative care  Code Status:  DNR  DVT Prophylaxis: Apixaban   Procedures: As Listed in Progress Note Above  Antibiotics: vanco 5/24>>5/26 Cefepime  5/24>>   Subjective: Patient is somnolent today.  Does not answer questions.  Objective: Vitals:   02/10/20 2113 02/11/20 0445 02/11/20 0816 02/11/20 1428  BP: (!) 122/57 (!) 163/58 (!) 149/71 (!) 142/52  Pulse: 75 64 60 64  Resp: 20 18  18   Temp: 99.1 F (37.3 C) 98.4 F (36.9 C)  98.3 F (36.8 C)  TempSrc: Oral Oral  Oral  SpO2: 100% 100%  100%  Weight:      Height:        Intake/Output Summary (Last 24 hours) at 02/11/2020 2024 Last data filed at 02/11/2020 1847 Gross per 24 hour  Intake 869.31 ml  Output --  Net 869.31 ml   Weight change:  Exam:  General exam: Somnolent Respiratory system: Crackles at bases.  Respiratory effort normal. Cardiovascular system:RRR. No murmurs, rubs, gallops. Gastrointestinal system: Abdomen is nondistended, soft and nontender. No organomegaly or masses felt. Normal bowel sounds heard. Central nervous system: No focal neurological deficits. Extremities: No C/C/E, +pedal pulses Skin: No rashes, lesions or ulcers  Psychiatry: Somnolent   Data Reviewed: I have personally reviewed following labs and imaging studies Basic Metabolic Panel: Recent Labs  Lab 02/05/20 1124 02/05/20 1126 02/06/20 0533 02/06/20 0533 02/07/20 0539 02/08/20 0517 02/09/20 0830 02/10/20 0559 02/11/20 0547  NA   < >  --  145   < > 145 146* 145 143 144  K   < >  --  3.3*   < > 3.1* 3.2* 2.8* 3.1* 4.1  CL   < >  --  100   < > 92* 88* 86* 90* 98  CO2   < >  --  35*   < > 43* 46* 44* 41* 37*  GLUCOSE   < >  --  194*   < > 159* 113* 206* 118* 183*  BUN   < >  --  13   < > 11 12 18 19 20   CREATININE   < >  --  0.54   < > 0.54 0.64 0.69 0.70 0.66  CALCIUM   < >  --  7.8*   < > 7.9* 7.9* 7.8* 7.9* 7.9*  MG  --  2.2 1.8  --   --   --  1.8  --   --    < > = values in this interval not displayed.   Liver Function Tests: Recent Labs  Lab 02/09/20 0830 02/10/20 0559  AST 12* 11*  ALT 18 16  ALKPHOS 72 65  BILITOT 1.1 1.0  PROT 5.0* 4.8*  ALBUMIN 2.2*  2.1*   No results for input(s): LIPASE, AMYLASE in the last 168 hours. Recent Labs  Lab 02/06/20 0758  AMMONIA 38*   Coagulation Profile: Recent Labs  Lab 02/05/20 1234  INR 1.9*   CBC: Recent Labs  Lab 02/05/20 1124 02/06/20 0533 02/07/20 0539 02/08/20 0517 02/09/20 0830  WBC 8.5 8.0 9.3 7.6 7.9  HGB 9.8* 9.5* 10.1* 10.1* 10.4*  HCT 32.4* 32.2* 33.1* 34.4* 35.4*  MCV 94.7 95.8 96.5 98.6 95.7  PLT 344 329 333 299 284   Cardiac Enzymes: No results for input(s): CKTOTAL, CKMB, CKMBINDEX, TROPONINI in the last 168 hours. BNP: Invalid input(s): POCBNP CBG: Recent Labs  Lab 02/10/20 2111 02/11/20 0748 02/11/20 1128 02/11/20 1627 02/11/20 2009  GLUCAP 210* 148* 200* 105* 264*   HbA1C:  No results for input(s): HGBA1C in the last 72 hours. Urine analysis:    Component Value Date/Time   COLORURINE YELLOW 02/05/2020 1147   APPEARANCEUR CLEAR 02/05/2020 1147   LABSPEC 1.017 02/05/2020 1147   PHURINE 6.0 02/05/2020 1147   GLUCOSEU >=500 (A) 02/05/2020 1147   HGBUR NEGATIVE 02/05/2020 1147   BILIRUBINUR NEGATIVE 02/05/2020 1147   KETONESUR NEGATIVE 02/05/2020 1147   PROTEINUR 30 (A) 02/05/2020 1147   UROBILINOGEN 0.2 01/23/2013 2317   NITRITE NEGATIVE 02/05/2020 1147   LEUKOCYTESUR NEGATIVE 02/05/2020 1147   Sepsis Labs: @LABRCNTIP (procalcitonin:4,lacticidven:4) ) Recent Results (from the past 240 hour(s))  SARS Coronavirus 2 by RT PCR (hospital order, performed in [facility] hospital lab) Nasopharyngeal Nasopharyngeal Swab     Status: None   Collection Time: 02/05/20 12:13 PM   Specimen: Nasopharyngeal Swab  Result Value Ref Range Status   SARS Coronavirus 2 NEGATIVE NEGATIVE Final    Comment: (NOTE) SARS-CoV-2 target nucleic acids are NOT DETECTED. The SARS-CoV-2 RNA is generally detectable in upper and lower respiratory specimens during the acute phase of infection. The lowest concentration of SARS-CoV-2 viral copies this assay can detect is 250 copies  / mL. A negative result does not preclude SARS-CoV-2 infection and should not be used as the sole basis for treatment or other patient management decisions.  A negative result may occur with improper specimen collection / handling, submission of specimen other than nasopharyngeal swab, presence of viral mutation(s) within the areas targeted by this assay, and inadequate number of viral copies (<250 copies / mL). A negative result must be combined with clinical observations, patient history, and epidemiological information. Fact Sheet for Patients:   StrictlyIdeas.no Fact Sheet for Healthcare Providers: BankingDealers.co.za This test is not yet approved or cleared  by the [place] FDA and has been authorized for detection and/or diagnosis of SARS-CoV-2 by FDA under an Emergency Use Authorization (EUA).  This EUA will remain in effect (meaning this test can be used) for the duration of the COVID-19 declaration under Section 564(b)(1) of the Act, 21 U.S.C. section 360bbb-3(b)(1), unless the authorization is terminated or revoked sooner. Performed at [facility], [address], [place], [place] [address]   Blood Culture (routine x 2)     Status: None   Collection Time: 02/05/20 12:24 PM   Specimen: BLOOD  Result Value Ref Range Status   Specimen Description BLOOD RIGHT ANTECUBITAL  Final   Special Requests   Final    BOTTLES DRAWN AEROBIC AND ANAEROBIC Blood Culture adequate volume   Culture   Final    NO GROWTH 5 DAYS Performed at [facility], [address], [place], [place] [address]    Report Status 02/10/2020 FINAL  Final  Blood Culture (routine x 2)     Status: None   Collection Time: 02/05/20 12:34 PM   Specimen: BLOOD  Result Value Ref Range Status   Specimen Description BLOOD LEFT ANTECUBITAL  Final   Special Requests   Final    BOTTLES DRAWN AEROBIC AND ANAEROBIC Blood Culture adequate volume   Culture   Final    NO  GROWTH 5 DAYS Performed at [facility], [address], [place], [place] [address]    Report Status 02/10/2020 FINAL  Final  MRSA PCR Screening     Status: None   Collection Time: 02/06/20 11:14 AM   Specimen: Nasal Mucosa; Nasopharyngeal  Result Value Ref Range Status   MRSA by PCR NEGATIVE NEGATIVE Final    Comment:  The GeneXpert MRSA Assay (FDA approved for NASAL specimens only), is one component of a comprehensive MRSA colonization surveillance program. It is not intended to diagnose MRSA infection nor to guide or monitor treatment for MRSA infections. Performed at [facility], [address] [place], [place] [address]      Scheduled Meds: . amiodarone  200 mg Oral Daily  . amLODipine  5 mg Oral Daily  . anastrozole  1 mg Oral Daily  . apixaban  5 mg Oral BID  . donepezil  5 mg Oral QHS  . furosemide  20 mg Intravenous Q12H  . insulin aspart  0-15 Units Subcutaneous TID WC  . insulin aspart  0-5 Units Subcutaneous QHS  . metoprolol tartrate  12.5 mg Oral BID  . risperiDONE  0.5 mg Oral BID  . simvastatin  20 mg Oral QPM  . sodium chloride flush  3 mL Intravenous Q12H   Continuous Infusions: . sodium chloride 250 mL (02/06/20 1422)  . albumin human    . ceFEPime (MAXIPIME) IV 2 g (02/11/20 1349)    Procedures/Studies: CT CHEST WO CONTRAST  Result Date: 02/06/2020 CLINICAL DATA:  Worsening airspace disease. Dyspnea. Multifocal pneumonia. EXAM: CT CHEST WITHOUT CONTRAST TECHNIQUE: Multidetector CT imaging of the chest was performed following the standard protocol without IV contrast. COMPARISON:  02/04/2019 FINDINGS: Cardiovascular: Moderate cardiac enlargement. No pericardial effusion. Aortic atherosclerosis. RCA, LAD coronary artery atherosclerotic calcifications. Mediastinum/Nodes: Normal appearance of the thyroid gland. Filling defect within the trachea is identified, new from previous exam and likely representing aspirated debris or mucus. Normal appearance  of the esophagus. No enlarged mediastinal adenopathy. Hilar structures are suboptimally evaluated due to lack of IV contrast material. Lungs/Pleura: New small right pleural effusion and moderate left pleural effusion noted with overlying areas of compressive type atelectasis. Interval development of extensive bilateral upper lobe airspace opacities, right greater than left. Imaging findings are concerning for multifocal pneumonia. Upper Abdomen: No acute findings identified within the imaged portions of the upper abdomen. Previous cholecystectomy with chronic dilatation of the CBD. Musculoskeletal: Degenerative disc disease identified within the lower thoracic and visualized portions of the upper lumbar spine. IMPRESSION: 1. Interval development of extensive bilateral upper lobe airspace opacities, right greater than left. Findings are concerning for multifocal pneumonia. 2. New small right pleural effusion and moderate left pleural effusion with overlying areas of compressive type atelectasis. 3. Aortic atherosclerosis, in addition to RCA and LAD coronary artery disease. Please note that although the presence of coronary artery calcium documents the presence of coronary artery disease, the severity of this disease and any potential stenosis cannot be assessed on this non-gated CT examination. Assessment for potential risk factor modification, dietary therapy or pharmacologic therapy may be warranted, if clinically indicated. Aortic Atherosclerosis (ICD10-I70.0). Electronically Signed   By: Kerby Moors M.D.   On: 02/06/2020 12:21   DG CHEST PORT 1 VIEW  Result Date: 02/11/2020 CLINICAL DATA:  Dyspnea, right breast cancer, dementia EXAM: PORTABLE CHEST 1 VIEW COMPARISON:  02/05/2020 chest radiograph. FINDINGS: Stable cardiomediastinal silhouette with mild cardiomegaly. No pneumothorax. Small bilateral pleural effusions, decreased on the right and stable on the left. Patchy opacities throughout the upper lungs  bilaterally, not substantially changed. Surgical clips overlie the right axilla. IMPRESSION: 1. Patchy opacities throughout the upper lungs bilaterally, not substantially changed, compatible with multilobar pneumonia. 2. Small bilateral pleural effusions, decreased on the right and stable on the left. Electronically Signed   By: Ilona Sorrel M.D.   On: 02/11/2020 05:10  DG Chest Port 1 View  Result Date: 02/05/2020 CLINICAL DATA:  Low O2 sats, altered mental status, hyperglycemia EXAM: PORTABLE CHEST 1 VIEW COMPARISON:  12/16/2019 FINDINGS: Patchy bilateral airspace opacities have progressed since prior study concerning for multifocal pneumonia. Mild cardiomegaly. Small right pleural effusion. No pneumothorax or acute bony abnormality. IMPRESSION: Worsening patchy bilateral airspace disease compatible with multifocal pneumonia. Small right effusion. Electronically Signed   By: Rolm Baptise M.D.   On: 02/05/2020 11:11    Kathie Dike, MD  [facility]  If 7PM-7AM, please contact night-coverage [email or web address]  02/11/2020, 8:24 PM   LOS: 6 days

## 2020-02-12 LAB — GLUCOSE, CAPILLARY
Glucose-Capillary: 166 mg/dL — ABNORMAL HIGH (ref 70–99)
Glucose-Capillary: 170 mg/dL — ABNORMAL HIGH (ref 70–99)
Glucose-Capillary: 160 mg/dL — ABNORMAL HIGH (ref 70–99)
Glucose-Capillary: 273 mg/dL — ABNORMAL HIGH (ref 70–99)

## 2020-02-12 LAB — COMPREHENSIVE METABOLIC PANEL
ALT: 12 U/L (ref 0–44)
AST: 11 U/L — ABNORMAL LOW (ref 15–41)
Albumin: 2.5 g/dL — ABNORMAL LOW (ref 3.5–5.0)
Alkaline Phosphatase: 55 U/L (ref 38–126)
Anion gap: 11 (ref 5–15)
BUN: 28 mg/dL — ABNORMAL HIGH (ref 8–23)
CO2: 35 mmol/L — ABNORMAL HIGH (ref 22–32)
Calcium: 8.4 mg/dL — ABNORMAL LOW (ref 8.9–10.3)
Chloride: 98 mmol/L (ref 98–111)
Creatinine, Ser: 0.86 mg/dL (ref 0.44–1.00)
GFR calc Af Amer: >60 mL/min (ref >60)
GFR calc non Af Amer: >60 mL/min (ref >60)
Glucose, Bld: 185 mg/dL — ABNORMAL HIGH (ref 70–99)
Potassium: 4.1 mmol/L (ref 3.5–5.1)
Sodium: 144 mmol/L (ref 135–145)
Total Bilirubin: 0.6 mg/dL (ref 0.3–1.2)
Total Protein: 5 g/dL — ABNORMAL LOW (ref 6.5–8.1)

## 2020-02-12 NOTE — Progress Notes (Signed)
Pharmacy Antibiotic Note  Amy Hoffman is a 79 y.o. female admitted on 02/05/2020 with pneumonia.  Pharmacy has been consulted for  cefepime  dosing. D#8 of cefepime. Patient requiring more oxygen. Afebrile, WBC normalized.   Plan: Continue Cefepime 2g IV q12h Monitor labs, c/s, and patient improvement.  F/U LOT   Height: 5\' 5"  (165.1 cm) Weight: 68 kg (150 lb) IBW/kg (Calculated) : 57  Temp (24hrs), Avg:98.4 F (36.9 C), Min:98.2 F (36.8 C), Max:98.6 F (37 C)  Recent Labs  Lab 02/05/20 1124 02/05/20 1124 02/05/20 1224 02/06/20 0533 02/06/20 0533 02/07/20 0539 02/07/20 0539 02/08/20 0517 02/09/20 0830 02/10/20 0559 02/11/20 0547 02/12/20 0550  WBC 8.5  --   --  8.0  --  9.3  --  7.6 7.9  --   --   --   CREATININE 0.70   < >  --  0.54   < > 0.54   < > 0.64 0.69 0.70 0.66 0.86  LATICACIDVEN  --   --  1.0  --   --   --   --   --   --   --   --   --    < > = values in this interval not displayed.    Estimated Creatinine Clearance: 47.7 mL/min (by C-G formula based on SCr of 0.86 mg/dL).    Allergies  Allergen Reactions  . Codeine   . Zantac [Ranitidine Hcl]      Antimicrobials this admission: vancomycin  5/24 >> 5/25  cefepime 5/24 >>  azithromycin 5/24>> x1 dose in ED  Dose adjustments this admission: cefepime  Microbiology results: 5/24  [facility] x2: ngtd 5/24 MRSA PCR: neg  Thank you for allowing pharmacy to be a part of this patient's care.  Isac Sarna, BS Pharm D, BCPS Clinical Pharmacist Pager [phone] 02/12/2020 8:16 AM

## 2020-02-12 NOTE — Progress Notes (Signed)
PROGRESS NOTE  Amy Hoffman [id number] DOB: [birth year] DOA: 02/05/2020 PCP: Doree Albee, MD  Brief History:  79 year old female with a history of dementia, diabetes mellitus type 2, hypertension, hyperlipidemia, asthma, right-sided breast cancer presenting with shortness of breath that was noticed by her family friends during a home visit.  Unfortunately, the patient is unable to provide any history at this time secondary to her dementia and encephalopathy.  In addition, there was concern for the patient's increased somnolence and drowsiness.  As result, the patient was brought to the emergency department for further evaluation.  In the ED, the patient remained somewhat drowsy with oxygen saturation in the 60% range.  The patient was placed on 5 L nasal cannula with oxygen saturation up to 98%.  Chest x-ray showed bilateral patchy infiltrates and increased interstitial markings.  WBC was 8.5.  BMP showed a potassium 3.0 with serum creatinine 0.70.  Notably, the patient was recently mated to the hospital from 12/16/2019 to 12/22/2019 secondary to respiratory failure from asthma exacerbation.  She also had atrial fibrillation with RVR at that time and was started on amiodarone and apixaban.  The patient was discharged to skilled nursing facility, [facility], where she stayed until up to 2 weeks ago when she returned home.  Assessment/Plan: Acute respiratory failure with hypoxia and hypercarbia -Secondary to pulmonary edema, CHF -5/24--7.37/60/134/32 (0.4) -Currently she has been weaned down to room air. -Wean oxygen for saturation greater than [id number]  Acute metabolic encephalopathy -Repeat ABG 7.413/60/150/35 on 4L -Serum B12--725 -TSH--1.503 -UA negative for pyuria -Ammonia--38 -folate 6.2 -Mental status significantly improved today.  Acute diastolic CHF -She was treated with intravenous Lasix -Overall volume status is better, appears to be approaching euvolemia -We will repeat  chest x-ray in a.m. -Daily weights -Accurate I's and O's -12/27/2019 echo EF 50-55%, no WMA, trivial MR  HCAP -CT chest--extensive bilateral upper lobe airspace opacities, right greater than left. New small right pleural effusion and moderate left pleural effusion  -PCT<0.10 -MRSA screen--neg -She is completed a course of cefepime -Speech therapy following to assess swallowing.  Paroxysmal Atrial fibrillation -Currently in sinus rhythm -Continue apixaban -Continue amiodarone -She did have an episode of transient atrial fibrillation with RVR. -Continue Lopressor  Essential hypertension -Continue amlodipine -Blood pressure stable  Dementia with behavioral disturbance -Continue Aricept -Use Ativan as needed for agitation.  Diabetes mellitus type 2 -12/16/2019 hemoglobin A1c 6.8 -NovoLog sliding scale -Holding Amaryl  Hyperlipidemia -Continue statin  Hypokalemia -Repleted -Magnesium 1.8  Goals of care -Seen by palliative care and DNR concurrent -Family wishes to continue with current treatments -If she fails to make meaningful improvements, they want to discharge her home with hospice services.  Status is: Inpatient  Remains inpatient appropriate because:Altered mental status and IV treatments appropriate due to intensity of illness or inability to take PO.  If patient continues to improve, anticipate that she will be ready for discharge to skilled nursing facility in next 24 hours.   Dispo: The patient is from: Home              Anticipated d/c is to: SNF              Anticipated d/c date is: 1 day              Patient currently is not medically stable to d/c.    Family Communication:   Discussed with husband and daughter at the bedside  Consultants: Palliative care  Code Status:  DNR  DVT Prophylaxis: Apixaban   Procedures: As Listed in Progress Note Above  Antibiotics: vanco 5/24>>5/26 Cefepime 5/24>>5/31   Subjective: Feels her breathing is  improving.  She is awake and alert and engaged in conversation.  Objective: Vitals:   02/11/20 2357 02/12/20 0504 02/12/20 0845 02/12/20 1422  BP: (!) 129/46 (!) 144/55 (!) 145/60 (!) 150/54  Pulse: 62 64 65 79  Resp: 15 20  18   Temp: 98.6 F (37 C) 98.2 F (36.8 C)  97.9 F (36.6 C)  TempSrc: Oral Oral    SpO2: 99% 100%  96%  Weight:      Height:        Intake/Output Summary (Last 24 hours) at 02/12/2020 2024 Last data filed at 02/12/2020 2022 Gross per 24 hour  Intake 880.7 ml  Output 1209 ml  Net -328.3 ml   Weight change:  Exam:  General exam: Awake, alert, no distress Respiratory system: Diminished breath sounds at left base.  Respiratory effort normal. Cardiovascular system:RRR. No murmurs, rubs, gallops. Gastrointestinal system: Abdomen is nondistended, soft and nontender. No organomegaly or masses felt. Normal bowel sounds heard. Central nervous system: No focal neurological deficits. Extremities: No C/C/E, +pedal pulses Skin: No rashes, lesions or ulcers  Psychiatry: Awake, pleasant, answering questions appropriately   Data Reviewed: I have personally reviewed following labs and imaging studies Basic Metabolic Panel: Recent Labs  Lab 02/06/20 0533 02/07/20 0539 02/08/20 0517 02/09/20 0830 02/10/20 0559 02/11/20 0547 02/12/20 0550  NA 145   < > 146* 145 143 144 144  K 3.3*   < > 3.2* 2.8* 3.1* 4.1 4.1  CL 100   < > 88* 86* 90* 98 98  CO2 35*   < > 46* 44* 41* 37* 35*  GLUCOSE 194*   < > 113* 206* 118* 183* 185*  BUN 13   < > 12 18 19 20  28*  CREATININE 0.54   < > 0.64 0.69 0.70 0.66 0.86  CALCIUM 7.8*   < > 7.9* 7.8* 7.9* 7.9* 8.4*  MG 1.8  --   --  1.8  --   --   --    < > = values in this interval not displayed.   Liver Function Tests: Recent Labs  Lab 02/09/20 0830 02/10/20 0559 02/12/20 0550  AST 12* 11* 11*  ALT 18 16 12   ALKPHOS 72 65 55  BILITOT 1.1 1.0 0.6  PROT 5.0* 4.8* 5.0*  ALBUMIN 2.2* 2.1* 2.5*   No results for input(s):  LIPASE, AMYLASE in the last 168 hours. Recent Labs  Lab 02/06/20 0758  AMMONIA 38*   Coagulation Profile: No results for input(s): INR, PROTIME in the last 168 hours. CBC: Recent Labs  Lab 02/06/20 0533 02/07/20 0539 02/08/20 0517 02/09/20 0830  WBC 8.0 9.3 7.6 7.9  HGB 9.5* 10.1* 10.1* 10.4*  HCT 32.2* 33.1* 34.4* 35.4*  MCV 95.8 96.5 98.6 95.7  PLT 329 333 299 284   Cardiac Enzymes: No results for input(s): CKTOTAL, CKMB, CKMBINDEX, TROPONINI in the last 168 hours. BNP: Invalid input(s): POCBNP CBG: Recent Labs  Lab 02/11/20 2009 02/12/20 0731 02/12/20 1110 02/12/20 1609 02/12/20 2013  GLUCAP 264* 166* 170* 160* 273*   HbA1C: No results for input(s): HGBA1C in the last 72 hours. Urine analysis:    Component Value Date/Time   COLORURINE YELLOW 02/05/2020 1147   APPEARANCEUR CLEAR 02/05/2020 1147   LABSPEC 1.017 02/05/2020 1147   PHURINE 6.0 02/05/2020 1147   GLUCOSEU >=500 (  A) 02/05/2020 1147   HGBUR NEGATIVE 02/05/2020 [facility] 02/05/2020 [facility] 02/05/2020 1147   PROTEINUR 30 (A) 02/05/2020 1147   UROBILINOGEN 0.2 01/23/2013 2317   NITRITE NEGATIVE 02/05/2020 1147   LEUKOCYTESUR NEGATIVE 02/05/2020 1147   Sepsis Labs: @LABRCNTIP (procalcitonin:4,lacticidven:4) ) Recent Results (from the past 240 hour(s))  SARS Coronavirus 2 by RT PCR (hospital order, performed in [facility] hospital lab) Nasopharyngeal Nasopharyngeal Swab     Status: None   Collection Time: 02/05/20 12:13 PM   Specimen: Nasopharyngeal Swab  Result Value Ref Range Status   SARS Coronavirus 2 NEGATIVE NEGATIVE Final    Comment: (NOTE) SARS-CoV-2 target nucleic acids are NOT DETECTED. The SARS-CoV-2 RNA is generally detectable in upper and lower respiratory specimens during the acute phase of infection. The lowest concentration of SARS-CoV-2 viral copies this assay can detect is 250 copies / mL. A negative result does not preclude SARS-CoV-2  infection and should not be used as the sole basis for treatment or other patient management decisions.  A negative result may occur with improper specimen collection / handling, submission of specimen other than nasopharyngeal swab, presence of viral mutation(s) within the areas targeted by this assay, and inadequate number of viral copies (<250 copies / mL). A negative result must be combined with clinical observations, patient history, and epidemiological information. Fact Sheet for Patients:   StrictlyIdeas.no Fact Sheet for Healthcare Providers: BankingDealers.co.za This test is not yet approved or cleared  by the [place] FDA and has been authorized for detection and/or diagnosis of SARS-CoV-2 by FDA under an Emergency Use Authorization (EUA).  This EUA will remain in effect (meaning this test can be used) for the duration of the COVID-19 declaration under Section 564(b)(1) of the Act, 21 U.S.C. section 360bbb-3(b)(1), unless the authorization is terminated or revoked sooner. Performed at [facility], [address] [place], [place] [address]   Blood Culture (routine x 2)     Status: None   Collection Time: 02/05/20 12:24 PM   Specimen: BLOOD  Result Value Ref Range Status   Specimen Description BLOOD RIGHT ANTECUBITAL  Final   Special Requests   Final    BOTTLES DRAWN AEROBIC AND ANAEROBIC Blood Culture adequate volume   Culture   Final    NO GROWTH 5 DAYS Performed at [facility], [address] [place], [place] [address]    Report Status 02/10/2020 FINAL  Final  Blood Culture (routine x 2)     Status: None   Collection Time: 02/05/20 12:34 PM   Specimen: BLOOD  Result Value Ref Range Status   Specimen Description BLOOD LEFT ANTECUBITAL  Final   Special Requests   Final    BOTTLES DRAWN AEROBIC AND ANAEROBIC Blood Culture adequate volume   Culture   Final    NO GROWTH 5 DAYS Performed at [facility], [address] [place], [place] [address]    Report Status 02/10/2020 FINAL  Final  MRSA PCR Screening     Status: None   Collection Time: 02/06/20 11:14 AM   Specimen: Nasal Mucosa; Nasopharyngeal  Result Value Ref Range Status   MRSA by PCR NEGATIVE NEGATIVE Final    Comment:        The GeneXpert MRSA Assay (FDA approved for NASAL specimens only), is one component of a comprehensive MRSA colonization surveillance program. It is not intended to diagnose MRSA infection nor to guide or monitor treatment for MRSA infections. Performed at [facility],  [place], [place] [address]      Scheduled Meds: . amiodarone  200 mg Oral Daily  . amLODipine  5 mg Oral Daily  . anastrozole  1 mg Oral Daily  . apixaban  5 mg Oral BID  . donepezil  5 mg Oral QHS  . insulin aspart  0-15 Units Subcutaneous TID WC  . insulin aspart  0-5 Units Subcutaneous QHS  . metoprolol tartrate  12.5 mg Oral BID  . risperiDONE  0.5 mg Oral BID  . simvastatin  20 mg Oral QPM  . sodium chloride flush  3 mL Intravenous Q12H   Continuous Infusions: . sodium chloride 250 mL (02/06/20 1422)    Procedures/Studies: CT CHEST WO CONTRAST  Result Date: 02/06/2020 CLINICAL DATA:  Worsening airspace disease. Dyspnea. Multifocal pneumonia. EXAM: CT CHEST WITHOUT CONTRAST TECHNIQUE: Multidetector CT imaging of the chest was performed following the standard protocol without IV contrast. COMPARISON:  02/04/2019 FINDINGS: Cardiovascular: Moderate cardiac enlargement. No pericardial effusion. Aortic atherosclerosis. RCA, LAD coronary artery atherosclerotic calcifications. Mediastinum/Nodes: Normal appearance of the thyroid gland. Filling defect within the trachea is identified, new from previous exam and likely representing aspirated debris or mucus. Normal appearance of the esophagus. No enlarged mediastinal adenopathy. Hilar structures are suboptimally evaluated due to lack of IV contrast material. Lungs/Pleura: New small  right pleural effusion and moderate left pleural effusion noted with overlying areas of compressive type atelectasis. Interval development of extensive bilateral upper lobe airspace opacities, right greater than left. Imaging findings are concerning for multifocal pneumonia. Upper Abdomen: No acute findings identified within the imaged portions of the upper abdomen. Previous cholecystectomy with chronic dilatation of the CBD. Musculoskeletal: Degenerative disc disease identified within the lower thoracic and visualized portions of the upper lumbar spine. IMPRESSION: 1. Interval development of extensive bilateral upper lobe airspace opacities, right greater than left. Findings are concerning for multifocal pneumonia. 2. New small right pleural effusion and moderate left pleural effusion with overlying areas of compressive type atelectasis. 3. Aortic atherosclerosis, in addition to RCA and LAD coronary artery disease. Please note that although the presence of coronary artery calcium documents the presence of coronary artery disease, the severity of this disease and any potential stenosis cannot be assessed on this non-gated CT examination. Assessment for potential risk factor modification, dietary therapy or pharmacologic therapy may be warranted, if clinically indicated. Aortic Atherosclerosis (ICD10-I70.0). Electronically Signed   By: Kerby Moors M.D.   On: 02/06/2020 12:21   DG CHEST PORT 1 VIEW  Result Date: 02/11/2020 CLINICAL DATA:  Dyspnea, right breast cancer, dementia EXAM: PORTABLE CHEST 1 VIEW COMPARISON:  02/05/2020 chest radiograph. FINDINGS: Stable cardiomediastinal silhouette with mild cardiomegaly. No pneumothorax. Small bilateral pleural effusions, decreased on the right and stable on the left. Patchy opacities throughout the upper lungs bilaterally, not substantially changed. Surgical clips overlie the right axilla. IMPRESSION: 1. Patchy opacities throughout the upper lungs bilaterally, not  substantially changed, compatible with multilobar pneumonia. 2. Small bilateral pleural effusions, decreased on the right and stable on the left. Electronically Signed   By: Ilona Sorrel M.D.   On: 02/11/2020 05:10   DG Chest Port 1 View  Result Date: 02/05/2020 CLINICAL DATA:  Low O2 sats, altered mental status, hyperglycemia EXAM: PORTABLE CHEST 1 VIEW COMPARISON:  12/16/2019 FINDINGS: Patchy bilateral airspace opacities have progressed since prior study concerning for multifocal pneumonia. Mild cardiomegaly. Small right pleural effusion. No pneumothorax or acute bony abnormality. IMPRESSION: Worsening patchy bilateral airspace disease compatible with multifocal pneumonia. Small  right effusion. Electronically Signed   By: Rolm Baptise M.D.   On: 02/05/2020 11:11    Kathie Dike, MD  [facility]  If 7PM-7AM, please contact night-coverage [email or web address]  02/12/2020, 8:24 PM   LOS: 7 days

## 2020-02-12 NOTE — Care Management Important Message (Signed)
Important Message  Patient Details  Name: Amy Hoffman MRN: [id number] Date of Birth: 08-Apr-1941   Medicare Important Message Given:  Yes     Tommy Medal 02/12/2020, 9:54 AM

## 2020-02-12 NOTE — Progress Notes (Signed)
  Speech Language Pathology Treatment: Dysphagia  Patient Details Name: Amy Hoffman MRN: [id number] DOB: 23-Jun-1941 Today's Date: 02/12/2020 Time: [id number] SLP Time Calculation (min) (ACUTE ONLY): 24 min  Assessment / Plan / Recommendation Clinical Impression  Pt seen for ongoing dysphagia intervention. She is more alert and has been consuming her most recent meals per family. She presents with some delayed coughing after po and it is difficult to assess whether this is related to residuals from ground textures or liquids. Will plan for [facility] tomorrow as radiology cannot accommodate today. Family in agreement. Continue diet as ordered.   HPI HPI: 79 year old female with a history of dementia, diabetes mellitus type 2, hypertension, hyperlipidemia, asthma, right-sided breast cancer presenting with shortness of breath that was noticed by her family friends during a home visit.  Unfortunately, the patient is unable to provide any history at this time secondary to her dementia and encephalopathy.  In addition, there was concern for the patient's increased somnolence and drowsiness.  As result, the patient was brought to the emergency department for further evaluation.  In the ED, the patient remained somewhat drowsy with oxygen saturation in the 60% range.  The patient was placed on 5 L nasal cannula with oxygen saturation up to 98%.  Chest x-ray showed bilateral patchy infiltrates and increased interstitial markings.  WBC was 8.5.  BMP showed a potassium 3.0 with serum creatinine 0.70.  Notably, the patient was recently mated to the hospital from 12/16/2019 to 12/22/2019 secondary to respiratory failure from asthma exacerbation.  She also had atrial fibrillation with RVR at that time and was started on amiodarone and apixaban.  The patient was discharged to skilled nursing facility, [facility], where she stayed until up to 2 weeks ago when she returned home. Pt seen during this recent hospitalization by ST and was  briefly on NTL but was d/c on a D3/thin diet with recommendation for meds whole with puree.       SLP Plan  Continue with current plan of care;MBS(consider MBS tomorrow afternoon)       Recommendations  Diet recommendations: Dysphagia 2 (fine chop);Nectar-thick liquid Liquids provided via: Cup;Straw Medication Administration: Whole meds with puree Supervision: Staff to assist with self feeding Compensations: Minimize environmental distractions;Slow rate;Follow solids with liquid Postural Changes and/or Swallow Maneuvers: Seated upright 90 degrees;Upright 30-60 min after meal                Oral Care Recommendations: Oral care BID Follow up Recommendations: 24 hour supervision/assistance;Skilled Nursing facility SLP Visit Diagnosis: Dysphagia, unspecified (R13.10) Plan: Continue with current plan of care;MBS(consider MBS tomorrow afternoon)       Thank you,  Genene Churn, [place]                 [place] 02/12/2020, 10:06 AM

## 2020-02-13 ENCOUNTER — Inpatient Hospital Stay (HOSPITAL_COMMUNITY): Payer: Medicare Other

## 2020-02-13 LAB — GLUCOSE, CAPILLARY
Glucose-Capillary: 158 mg/dL — ABNORMAL HIGH (ref 70–99)
Glucose-Capillary: 216 mg/dL — ABNORMAL HIGH (ref 70–99)
Glucose-Capillary: 89 mg/dL (ref 70–99)
Glucose-Capillary: 316 mg/dL — ABNORMAL HIGH (ref 70–99)

## 2020-02-13 LAB — CBC
HCT: 35.8 % — ABNORMAL LOW (ref 36.0–46.0)
Hemoglobin: 10.7 g/dL — ABNORMAL LOW (ref 12.0–15.0)
MCH: 28.5 pg (ref 26.0–34.0)
MCHC: 29.9 g/dL — ABNORMAL LOW (ref 30.0–36.0)
MCV: 95.5 fL (ref 80.0–100.0)
Platelets: 267 10*3/uL (ref 150–400)
RBC: 3.75 MIL/uL — ABNORMAL LOW (ref 3.87–5.11)
RDW: 14 % (ref 11.5–15.5)
WBC: 6.7 10*3/uL (ref 4.0–10.5)
nRBC: 0 % (ref 0.0–0.2)

## 2020-02-13 LAB — BASIC METABOLIC PANEL
Anion gap: 12 (ref 5–15)
BUN: 21 mg/dL (ref 8–23)
CO2: 35 mmol/L — ABNORMAL HIGH (ref 22–32)
Calcium: 8.8 mg/dL — ABNORMAL LOW (ref 8.9–10.3)
Chloride: 96 mmol/L — ABNORMAL LOW (ref 98–111)
Creatinine, Ser: 0.68 mg/dL (ref 0.44–1.00)
GFR calc Af Amer: >60 mL/min (ref >60)
GFR calc non Af Amer: >60 mL/min (ref >60)
Glucose, Bld: 173 mg/dL — ABNORMAL HIGH (ref 70–99)
Potassium: 3.9 mmol/L (ref 3.5–5.1)
Sodium: 143 mmol/L (ref 135–145)

## 2020-02-13 LAB — SARS CORONAVIRUS 2 BY RT PCR (HOSPITAL ORDER, PERFORMED IN ~~LOC~~ HOSPITAL LAB): SARS Coronavirus 2: NEGATIVE

## 2020-02-13 MED ORDER — RISPERIDONE 0.5 MG PO TABS
0.5000 mg | ORAL_TABLET | Freq: Every day | ORAL | Status: DC
  Administered 2020-02-13: 0.5 mg via ORAL
  Filled 2020-02-13: qty 1

## 2020-02-13 MED ORDER — RISPERIDONE 0.5 MG PO TABS: 0.5000 mg | ORAL_TABLET | Freq: Every day | ORAL | Status: DC

## 2020-02-13 NOTE — NC FL2 (Signed)
[place] LEVEL OF CARE SCREENING TOOL     IDENTIFICATION  Patient Name: Amy Hoffman Birthdate: Dec 25, 1940 Sex: female Admission Date (Current Location): 02/05/2020  [facility] and [place] Number:  [employer] and Address:  [place] [address] [place]      Provider Number: [id number]  Attending Physician Name and Address:  Kathie Dike, MD  Relative Name and Phone Number:  Denny Peon - daughter [phone]    Current Level of Care: Hospital Recommended Level of Care: [place] Prior Approval Number:    Date Approved/Denied:   PASRR Number: pending  Discharge Plan: SNF    Current Diagnoses: Patient Active Problem List   Diagnosis Date Noted  . Do not resuscitate status   . Acute respiratory failure with hypoxia and hypercarbia ([place]) 02/06/2020  . Acute diastolic CHF (congestive heart failure) ([place]) 02/06/2020  . Acute metabolic encephalopathy [id number]  . Multifocal pneumonia 02/05/2020  . CHF (congestive heart failure) ([place]) 02/05/2020  . Hypokalemia   . Hypoxia   . Low oxygen saturation   . Advanced care planning/counseling discussion   . Goals of care, counseling/discussion   . Acute respiratory failure with hypoxia ([place])   . Dementia with behavioral disturbance ([place])   . Palliative care by specialist   . Asthma exacerbation 12/16/2019  . Atrial fibrillation ([place]) 12/16/2019  . Community acquired pneumonia 12/16/2019  . Hyperlipidemia, unspecified   . Obesity, unspecified   . Type 2 diabetes mellitus with hemoglobin A1c goal of less than 7.0% (HCC)   . Abnormal uterine and vaginal bleeding, unspecified   . Allergic rhinitis due to pollen   . Essential (primary) hypertension   . Major depressive disorder, single episode, unspecified   . Unspecified asthma with (acute) exacerbation   . Unspecified asthma, uncomplicated   . 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 ([place])    . Unspecified osteoarthritis, unspecified site   . Pain in joint involving shoulder region   . Dysphagia 07/10/2019  . Loss of weight 07/10/2019  . Numbness and tingling in hands 04/17/2014  . SOB (shortness of breath) 01/30/2014  . Breast CA ([place]) 01/30/2014  . Elevated blood pressure 01/30/2014    Orientation RESPIRATION BLADDER Height & Weight     Self  O2(3L) External catheter Weight: 68 kg Height:  5\' 5"  (165.1 cm)  BEHAVIORAL SYMPTOMS/MOOD NEUROLOGICAL BOWEL NUTRITION STATUS      Continent Diet(Dysphagia 2 ( fine chopped))  AMBULATORY STATUS COMMUNICATION OF NEEDS Skin   Extensive Assist Verbally Bruising(arms)                       Personal Care Assistance Level of Assistance  Bathing, Feeding, Dressing Bathing Assistance: Limited assistance Feeding assistance: Limited assistance Dressing Assistance: Maximum assistance     Functional Limitations Info  Sight, Speech, Hearing Sight Info: Impaired Hearing Info: Impaired Speech Info: Impaired    SPECIAL CARE FACTORS FREQUENCY  PT (By licensed PT)     PT Frequency: 5 times a week.              Contractures Contractures Info: Not present    Additional Factors Info  Code Status, Allergies Code Status Info: DNR Allergies Info: codeine, zantac           Current Medications (02/13/2020):  This is the current hospital active medication list Current Facility-Administered Medications  Medication Dose Route Frequency Provider Last Rate Last Admin  . 0.9 %  sodium  chloride infusion  250 mL Intravenous PRN Roney Jaffe, MD 10 mL/hr at 02/06/20 1422 250 mL at 02/06/20 1422  . acetaminophen (TYLENOL) tablet 650 mg  650 mg Oral Q6H PRN Roney Jaffe, MD       Or  . acetaminophen (TYLENOL) suppository 650 mg  650 mg Rectal Q6H PRN Roney Jaffe, MD      . amiodarone (PACERONE) tablet 200 mg  200 mg Oral Daily Roney Jaffe, MD   200 mg at 02/13/20 0817  . amLODipine (NORVASC) tablet 5 mg  5 mg Oral  Daily Roney Jaffe, MD   5 mg at 02/13/20 0816  . anastrozole (ARIMIDEX) tablet 1 mg  1 mg Oral Daily Roney Jaffe, MD   1 mg at 02/13/20 0816  . apixaban (ELIQUIS) tablet 5 mg  5 mg Oral BID Roney Jaffe, MD   5 mg at 02/13/20 0816  . donepezil (ARICEPT) tablet 5 mg  5 mg Oral QHS Roney Jaffe, MD   5 mg at 02/12/20 2153  . insulin aspart (novoLOG) injection 0-15 Units  0-15 Units Subcutaneous TID WC Roney Jaffe, MD   5 Units at 02/13/20 1147  . insulin aspart (novoLOG) injection 0-5 Units  0-5 Units Subcutaneous QHS Roney Jaffe, MD   3 Units at 02/12/20 2151  . LORazepam (ATIVAN) injection 0.5 mg  0.5 mg Intravenous Q6H PRN Kathie Dike, MD   0.5 mg at 02/12/20 2152  . metoprolol tartrate (LOPRESSOR) tablet 12.5 mg  12.5 mg Oral BID Kathie Dike, MD   12.5 mg at 02/13/20 0817  . ondansetron (ZOFRAN) tablet 4 mg  4 mg Oral Q6H PRN Roney Jaffe, MD       Or  . ondansetron (ZOFRAN) injection 4 mg  4 mg Intravenous Q6H PRN Roney Jaffe, MD      . polyethylene glycol (MIRALAX / GLYCOLAX) packet 17 g  17 g Oral Daily PRN Roney Jaffe, MD      . risperiDONE (RISPERDAL) tablet 0.5 mg  0.5 mg Oral QHS Memon, Jolaine Artist, MD      . simvastatin (ZOCOR) tablet 20 mg  20 mg Oral QPM Roney Jaffe, MD   20 mg at 02/12/20 1810  . sodium chloride flush (NS) 0.9 % injection 3 mL  3 mL Intravenous Q12H Roney Jaffe, MD   3 mL at 02/13/20 0818  . sodium chloride flush (NS) 0.9 % injection 3 mL  3 mL Intravenous PRN Roney Jaffe, MD         Discharge Medications: Please see discharge summary for a list of discharge medications.  Relevant Imaging Results:  Relevant Lab Results:   Additional Information SS#  [id number]  Boneta Lucks, RN

## 2020-02-13 NOTE — Progress Notes (Signed)
Modified Barium Swallow Progress Note  Patient Details  Name: Amy Hoffman MRN: [id number] Date of Birth: Feb 14, 1941  Today's Date: 02/13/2020  Modified Barium Swallow completed.  Full report located under Chart Review in the Imaging Section.  Brief recommendations include the following:  Clinical Impression  Pt presents with mild pharyngeal phase dysphagia negatively impacted by lethargy and cognitive deficits characterized by reduced attention to task with reduced labial closure and bolus manipulation with liquids (pt unable to safely take thins from the cup and pill was expectorated after presentation whole in puree and NTL), premature spillage of liquids to the level of the pyriforms resulting in penetration/aspiration of thins during and after the swallow from residuals on subsequent swallows in trace amount to which Pt was variably sensate to. Pt with seemingly adequate hyolaryngeal excursion and tongue base retraction with no residuals with puree and mech soft textures. Pt with trace under epiglottic coating of thins which eventually moved to vocal folds and were aspirated in trace amounts. Pt was unable to follow directions for strategies this date due to lethargy. NTL were penetrated in trace amounts and no observable aspiration observed (it appeared that she aspirated thin residuals during NTL presentations). Recommend D3/mech soft and NTL via cup/straw/spoon with aspiration and reflux precautions with trials of thins with SLP in SNF when Pt is consistently alert.    Swallow Evaluation Recommendations       SLP Diet Recommendations: Dysphagia 3 (Mech soft) solids;Nectar thick liquid   Liquid Administration via: Cup;Straw;Spoon   Medication Administration: Crushed with puree   Supervision: Full assist for feeding;Staff to assist with self feeding;Full supervision/cueing for compensatory strategies   Compensations: Minimize environmental distractions;Slow rate;Follow solids with  liquid   Postural Changes: Remain semi-upright after after feeds/meals (Comment);Seated upright at 90 degrees   Oral Care Recommendations: Oral care BID;Staff/trained caregiver to provide oral care   Other Recommendations: Order thickener from pharmacy;Remove water pitcher;Clarify dietary restrictions   Thank you,  Genene Churn, [place]  Teira Arcilla 02/13/2020,6:21 PM

## 2020-02-13 NOTE — Evaluation (Signed)
Physical Therapy Evaluation Patient Details Name: Amy Hoffman MRN: [id number] DOB: 26-Oct-1940 Today's Date: 02/13/2020   History of Present Illness  79 year old female with a history of dementia, diabetes mellitus type 2, hypertension, hyperlipidemia, asthma, right-sided breast cancer presenting with shortness of breath that was noticed by her family friends during a home visit.  Unfortunately, the patient is unable to provide any history at this time secondary to her dementia and encephalopathy.  In addition, there was concern for the patient's increased somnolence and drowsiness.  As result, the patient was brought to the emergency department for further evaluation.  In the ED, the patient remained somewhat drowsy with oxygen saturation in the 60% range.  The patient was placed on 5 L nasal cannula with oxygen saturation up to 98%.  Chest x-ray showed bilateral patchy infiltrates and increased interstitial markings.  WBC was 8.5.  BMP showed a potassium 3.0 with serum creatinine 0.70.  Notably, the patient was recently mated to the hospital from 12/16/2019 to 12/22/2019 secondary to respiratory failure from asthma exacerbation.  She also had atrial fibrillation with RVR at that time and was started on amiodarone and apixaban.  The patient was discharged to skilled nursing facility, [facility], where she stayed until up to 2 weeks ago when she returned home.    Clinical Impression  Pt admitted with above diagnosis. Pt disoriented to date, situation and location; able to recall name and city of [place], but not sure of age or being in hospital. Pt able to follow commands appropriately with assistance, but becomes agitated and requires reorientation and soothing. Pt attempts STS from EOB with therapist, but only able to rise to stooped standing position with max assist and therapist positioned in front of pt; pt unable to stand upright despite cues and returns to sitting in <10 sec. Pt returned to supine with  bed alarm on and call bell in lap. RN notified of pt on 3L with SpO2 99% with mobility, no increased work of breathing and no facial wincing with mobility. Unsure of PLOF or discharge plans due to no family present during evaluation, but pt is requiring heavy assist and will likely require 24/7 assist upon discharge. Pt currently with functional limitations due to the deficits listed below (see PT Problem List). Pt will benefit from skilled PT to increase their independence and safety with mobility to allow discharge to the venue listed below.       Follow Up Recommendations SNF;Supervision/Assistance - 24 hour    Equipment Recommendations  Other (comment)(TBD)    Recommendations for Other Services       Precautions / Restrictions Precautions Precautions: Fall Restrictions Weight Bearing Restrictions: No      Mobility  Bed Mobility Overal bed mobility: Needs Assistance Bed Mobility: Supine to Sit;Sit to Supine  Supine to sit: Mod assist;HOB elevated Sit to supine: Mod assist  General bed mobility comments: mod assist to upright trunk with HOB elevated and use of bedrail; mod assist to bring BLE back into bed with sit to supine, cues for sequencing and hand placement to improve technique and reduce anxiousness/agitation  Transfers Overall transfer level: Needs assistance Equipment used: None;Rolling walker (2 wheeled) Transfers: Sit to/from Stand Sit to Stand: Max assist  General transfer comment: STS with max assist x3 attempts, 2 with RW and once with therapist positioned in front of pt, able to clear bottom into stooped posture with therapist anterior to pt but unable to achieve upright standing  Ambulation/Gait  General Gait Details: unable  Stairs            Wheelchair Mobility    Modified Rankin (Stroke Patients Only)       Balance Overall balance assessment: Needs assistance Sitting-balance support: Feet supported;No upper extremity supported Sitting  balance-Leahy Scale: Fair Sitting balance - Comments: seated EOB, posterior pelvic tilt with kyphotic  posture and foreward head  Standing balance comment: max assist to achieve stooped posture for <10 sec before return to sitting        Pertinent Vitals/Pain Pain Assessment: Faces Faces Pain Scale: No hurt    Home Living Family/patient expects to be discharged to:: Unsure        Prior Function Level of Independence: (unsure)         Comments: Pt reports she ambulates around the home without AD, but unable to give any other details regarding PLOF and no family present. Per chart review, pt was previously ambulatory and cared for by family friends 24/7 due to dementia.     Hand Dominance        Extremity/Trunk Assessment   Upper Extremity Assessment Upper Extremity Assessment: Generalized weakness    Lower Extremity Assessment Lower Extremity Assessment: Generalized weakness(BLE AROM <50%, denies numbness/tingling)    Cervical / Trunk Assessment Cervical / Trunk Assessment: Kyphotic(in sitting)  Communication   Communication: HOH  Cognition Arousal/Alertness: Awake/alert Behavior During Therapy: Restless Overall Cognitive Status: No family/caregiver present to determine baseline cognitive functioning  General Comments: Pt wakes easily to therapist voice. Pt becomes restless and agitated during evaluation, but able to soothe with orienting pt.      General Comments General comments (skin integrity, edema, etc.): on 3L with SpO2 99%    Exercises     Assessment/Plan    PT Assessment Patient needs continued PT services  PT Problem List Decreased strength;Decreased range of motion;Decreased activity tolerance;Decreased balance;Decreased mobility;Decreased coordination;Decreased cognition;Decreased knowledge of use of DME;Decreased safety awareness       PT Treatment Interventions DME instruction;Gait training;Functional mobility training;Therapeutic  activities;Therapeutic exercise;Balance training;Neuromuscular re-education;Cognitive remediation;Patient/family education    PT Goals (Current goals can be found in the Care Plan section)  Acute Rehab PT Goals Patient Stated Goal: "I want some pants" PT Goal Formulation: With patient Time For Goal Achievement: 02/27/20 Potential to Achieve Goals: Fair    Frequency Min 2X/week   Barriers to discharge        Co-evaluation               AM-PAC PT "6 Clicks" Mobility  Outcome Measure Help needed turning from your back to your side while in a flat bed without using bedrails?: A Lot Help needed moving from lying on your back to sitting on the side of a flat bed without using bedrails?: A Lot Help needed moving to and from a bed to a chair (including a wheelchair)?: Total Help needed standing up from a chair using your arms (e.g., wheelchair or bedside chair)?: Total Help needed to walk in hospital room?: Total Help needed climbing 3-5 steps with a railing? : Total 6 Click Score: 8    End of Session Equipment Utilized During Treatment: Gait belt;Oxygen Activity Tolerance: Patient limited by fatigue;Treatment limited secondary to agitation Patient left: in bed;with call bell/phone within reach;with bed alarm set Nurse Communication: Mobility status;Other (comment)(oxygen) PT Visit Diagnosis: Unsteadiness on feet (R26.81);Other abnormalities of gait and mobility (R26.89);Muscle weakness (generalized) (M62.81)    Time: [id number] PT Time Calculation (min) (ACUTE ONLY): 18 min   Charges:   PT  Evaluation $PT Eval Moderate Complexity: 1 Mod           Tori Nyan Dufresne PT, DPT 02/13/20, 1:20 PM [phone]

## 2020-02-13 NOTE — Progress Notes (Signed)
PROGRESS NOTE  Collen Hoffman Cen [id number] DOB: [birth year] DOA: 02/05/2020 PCP: Doree Albee, MD  Brief History:  79 year old female with a history of dementia, diabetes mellitus type 2, hypertension, hyperlipidemia, asthma, right-sided breast cancer presenting with shortness of breath that was noticed by her family friends during a home visit.  Unfortunately, the patient is unable to provide any history at this time secondary to her dementia and encephalopathy.  In addition, there was concern for the patient's increased somnolence and drowsiness.  As result, the patient was brought to the emergency department for further evaluation.  In the ED, the patient remained somewhat drowsy with oxygen saturation in the 60% range.  The patient was placed on 5 L nasal cannula with oxygen saturation up to 98%.  Chest x-ray showed bilateral patchy infiltrates and increased interstitial markings.  WBC was 8.5.  BMP showed a potassium 3.0 with serum creatinine 0.70.  Notably, the patient was recently mated to the hospital from 12/16/2019 to 12/22/2019 secondary to respiratory failure from asthma exacerbation.  She also had atrial fibrillation with RVR at that time and was started on amiodarone and apixaban.  The patient was discharged to skilled nursing facility, [facility], where she stayed until up to 2 weeks ago when she returned home.  Assessment/Plan: Acute respiratory failure with hypoxia and hypercarbia -Secondary to pulmonary edema, CHF -5/24--7.37/60/134/32 (0.4) -Currently she has been weaned down to room air. -Wean oxygen for saturation greater than 92% -may need to discharge on supplemental oxygen  Acute metabolic encephalopathy -Repeat ABG 7.413/60/150/35 on 4L -Serum B12--725 -TSH--1.503 -UA negative for pyuria -Ammonia--38 -folate 6.2 -she has been more somnolent in the mornings -will change risperdal from BID to QHS  Acute diastolic CHF -She was treated with intravenous  Lasix -Overall volume status is better, appears to be approaching euvolemia -Daily weights -Accurate I's and O's -12/27/2019 echo EF 50-55%, no WMA, trivial MR  HCAP -CT chest--extensive bilateral upper lobe airspace opacities, right greater than left. New small right pleural effusion and moderate left pleural effusion  -PCT<0.10 -MRSA screen--neg -She has completed a course of cefepime -Speech therapy following to assess swallowing.  -MBSS done 6/1 with recommendations for dysphagia 3 with nectar thick liquids.  -she appears to have aspirated thin liquids  Paroxysmal Atrial fibrillation -Currently in sinus rhythm -Continue apixaban -Continue amiodarone -She did have an episode of transient atrial fibrillation with RVR. -Continue Lopressor  Essential hypertension -Continue amlodipine -Blood pressure stable  Dementia with behavioral disturbance -Continue Aricept, rispderdal -Use Ativan as needed for agitation.  Diabetes mellitus type 2 -12/16/2019 hemoglobin A1c 6.8 -NovoLog sliding scale -Holding Amaryl  Hyperlipidemia -Continue statin  Hypokalemia -Repleted -Magnesium 1.8  Goals of care -Seen by palliative care and DNR concurrent -Family wishes to continue with current treatments -If she fails to make meaningful improvements, they want to discharge her home with hospice services.  Status is: Inpatient  Remains inpatient appropriate because:Altered mental status and IV treatments appropriate due to intensity of illness or inability to take PO.  If patient continues to improve, anticipate that she will be ready for discharge to skilled nursing facility in next 24 hours.   Dispo: The patient is from: Home              Anticipated d/c is to: SNF              Anticipated d/c date is: 1 day  Patient currently is not medically stable to d/c.    Family Communication:   Discussed with husband and daughter at the bedside  Consultants: Palliative  care  Code Status:  DNR  DVT Prophylaxis: Apixaban   Procedures: As Listed in Progress Note Above  Antibiotics: vanco 5/24>>5/26 Cefepime 5/24>>5/31   Subjective: She is somnolent but wakes up to voice. She denies any shortness of breath. She was placed back on oxygen overnight.  Objective: Vitals:   02/12/20 2034 02/13/20 0612 02/13/20 0932 02/13/20 1405  BP: (!) 144/56 (!) 148/57  (!) 138/59  Pulse: 75 69 60 (!) 54  Resp: 20 20  20   Temp: 99.1 F (37.3 C) 98 F (36.7 C)  98.2 F (36.8 C)  TempSrc: Oral Oral  Oral  SpO2: 90% 96% 100% 90%  Weight:      Height:        Intake/Output Summary (Last 24 hours) at 02/13/2020 2003 Last data filed at 02/13/2020 1831 Gross per 24 hour  Intake 600 ml  Output 2 ml  Net 598 ml   Weight change:  Exam:  General exam: Alert, awake, oriented x 3 Respiratory system: Clear to auscultation. Respiratory effort normal. Cardiovascular system:RRR. No murmurs, rubs, gallops. Gastrointestinal system: Abdomen is nondistended, soft and nontender. No organomegaly or masses felt. Normal bowel sounds heard. Central nervous system: No focal neurological deficits. Extremities: No C/C/E, +pedal pulses Skin: No rashes, lesions or ulcers  Psychiatry: she is somnolent, but wakes up to voice and is pleasant    Data Reviewed: I have personally reviewed following labs and imaging studies Basic Metabolic Panel: Recent Labs  Lab 02/09/20 0830 02/10/20 0559 02/11/20 0547 02/12/20 0550 02/13/20 0525  NA 145 143 144 144 143  K 2.8* 3.1* 4.1 4.1 3.9  CL 86* 90* 98 98 96*  CO2 44* 41* 37* 35* 35*  GLUCOSE 206* 118* 183* 185* 173*  BUN 18 19 20  28* 21  CREATININE 0.69 0.70 0.66 0.86 0.68  CALCIUM 7.8* 7.9* 7.9* 8.4* 8.8*  MG 1.8  --   --   --   --    Liver Function Tests: Recent Labs  Lab 02/09/20 0830 02/10/20 0559 02/12/20 0550  AST 12* 11* 11*  ALT 18 16 12   ALKPHOS 72 65 55  BILITOT 1.1 1.0 0.6  PROT 5.0* 4.8* 5.0*  ALBUMIN 2.2*  2.1* 2.5*   No results for input(s): LIPASE, AMYLASE in the last 168 hours. No results for input(s): AMMONIA in the last 168 hours. Coagulation Profile: No results for input(s): INR, PROTIME in the last 168 hours. CBC: Recent Labs  Lab 02/07/20 0539 02/08/20 0517 02/09/20 0830 02/13/20 0525  WBC 9.3 7.6 7.9 6.7  HGB 10.1* 10.1* 10.4* 10.7*  HCT 33.1* 34.4* 35.4* 35.8*  MCV 96.5 98.6 95.7 95.5  PLT 333 299 284 267   Cardiac Enzymes: No results for input(s): CKTOTAL, CKMB, CKMBINDEX, TROPONINI in the last 168 hours. BNP: Invalid input(s): POCBNP CBG: Recent Labs  Lab 02/12/20 1609 02/12/20 2013 02/13/20 0715 02/13/20 1108 02/13/20 1645  GLUCAP 160* 273* 158* 216* 89   HbA1C: No results for input(s): HGBA1C in the last 72 hours. Urine analysis:    Component Value Date/Time   COLORURINE YELLOW 02/05/2020 1147   APPEARANCEUR CLEAR 02/05/2020 1147   LABSPEC 1.017 02/05/2020 1147   PHURINE 6.0 02/05/2020 1147   GLUCOSEU >=500 (A) 02/05/2020 1147   HGBUR NEGATIVE 02/05/2020 1147   [place] 02/05/2020 1147   [place] 02/05/2020 1147  PROTEINUR 30 (A) 02/05/2020 1147   UROBILINOGEN 0.2 01/23/2013 2317   NITRITE NEGATIVE 02/05/2020 1147   LEUKOCYTESUR NEGATIVE 02/05/2020 1147   Sepsis Labs: @LABRCNTIP (procalcitonin:4,lacticidven:4) ) Recent Results (from the past 240 hour(s))  SARS Coronavirus 2 by RT PCR (hospital order, performed in [facility] hospital lab) Nasopharyngeal Nasopharyngeal Swab     Status: None   Collection Time: 02/05/20 12:13 PM   Specimen: Nasopharyngeal Swab  Result Value Ref Range Status   SARS Coronavirus 2 NEGATIVE NEGATIVE Final    Comment: (NOTE) SARS-CoV-2 target nucleic acids are NOT DETECTED. The SARS-CoV-2 RNA is generally detectable in upper and lower respiratory specimens during the acute phase of infection. The lowest concentration of SARS-CoV-2 viral copies this assay can detect is 250 copies / mL. A  negative result does not preclude SARS-CoV-2 infection and should not be used as the sole basis for treatment or other patient management decisions.  A negative result may occur with improper specimen collection / handling, submission of specimen other than nasopharyngeal swab, presence of viral mutation(s) within the areas targeted by this assay, and inadequate number of viral copies (<250 copies / mL). A negative result must be combined with clinical observations, patient history, and epidemiological information. Fact Sheet for Patients:   StrictlyIdeas.no Fact Sheet for Healthcare Providers: BankingDealers.co.za This test is not yet approved or cleared  by the [place] FDA and has been authorized for detection and/or diagnosis of SARS-CoV-2 by FDA under an Emergency Use Authorization (EUA).  This EUA will remain in effect (meaning this test can be used) for the duration of the COVID-19 declaration under Section 564(b)(1) of the Act, 21 U.S.C. section 360bbb-3(b)(1), unless the authorization is terminated or revoked sooner. Performed at [facility], [address] [place], [place] [address]   Blood Culture (routine x 2)     Status: None   Collection Time: 02/05/20 12:24 PM   Specimen: BLOOD  Result Value Ref Range Status   Specimen Description BLOOD RIGHT ANTECUBITAL  Final   Special Requests   Final    BOTTLES DRAWN AEROBIC AND ANAEROBIC Blood Culture adequate volume   Culture   Final    NO GROWTH 5 DAYS Performed at [facility], [address], [place], [place] [address]    Report Status 02/10/2020 FINAL  Final  Blood Culture (routine x 2)     Status: None   Collection Time: 02/05/20 12:34 PM   Specimen: BLOOD  Result Value Ref Range Status   Specimen Description BLOOD LEFT ANTECUBITAL  Final   Special Requests   Final    BOTTLES DRAWN AEROBIC AND ANAEROBIC Blood Culture adequate volume   Culture   Final    NO GROWTH 5  DAYS Performed at [facility], [address] [place], [place] [address]    Report Status 02/10/2020 FINAL  Final  MRSA PCR Screening     Status: None   Collection Time: 02/06/20 11:14 AM   Specimen: Nasal Mucosa; Nasopharyngeal  Result Value Ref Range Status   MRSA by PCR NEGATIVE NEGATIVE Final    Comment:        The GeneXpert MRSA Assay (FDA approved for NASAL specimens only), is one component of a comprehensive MRSA colonization surveillance program. It is not intended to diagnose MRSA infection nor to guide or monitor treatment for MRSA infections. Performed at [facility] [address] [place], [place] [address]   SARS Coronavirus 2 by RT PCR (hospital order, performed in [facility] hospital lab) Nasopharyngeal  Nasopharyngeal Swab     Status: None   Collection Time: 02/13/20  7:00 PM   Specimen: Nasopharyngeal Swab  Result Value Ref Range Status   SARS Coronavirus 2 NEGATIVE NEGATIVE Final    Comment: (NOTE) SARS-CoV-2 target nucleic acids are NOT DETECTED. The SARS-CoV-2 RNA is generally detectable in upper and lower respiratory specimens during the acute phase of infection. The lowest concentration of SARS-CoV-2 viral copies this assay can detect is 250 copies / mL. A negative result does not preclude SARS-CoV-2 infection and should not be used as the sole basis for treatment or other patient management decisions.  A negative result may occur with improper specimen collection / handling, submission of specimen other than nasopharyngeal swab, presence of viral mutation(s) within the areas targeted by this assay, and inadequate number of viral copies (<250 copies / mL). A negative result must be combined with clinical observations, patient history, and epidemiological information. Fact Sheet for Patients:   StrictlyIdeas.no Fact Sheet for Healthcare Providers: BankingDealers.co.za This test is not yet approved or  cleared  by the [place] FDA and has been authorized for detection and/or diagnosis of SARS-CoV-2 by FDA under an Emergency Use Authorization (EUA).  This EUA will remain in effect (meaning this test can be used) for the duration of the COVID-19 declaration under Section 564(b)(1) of the Act, 21 U.S.C. section 360bbb-3(b)(1), unless the authorization is terminated or revoked sooner. Performed at [facility], [address], [place], [place] [address]      Scheduled Meds: . amiodarone  200 mg Oral Daily  . amLODipine  5 mg Oral Daily  . anastrozole  1 mg Oral Daily  . apixaban  5 mg Oral BID  . donepezil  5 mg Oral QHS  . insulin aspart  0-15 Units Subcutaneous TID WC  . insulin aspart  0-5 Units Subcutaneous QHS  . metoprolol tartrate  12.5 mg Oral BID  . risperiDONE  0.5 mg Oral QHS  . simvastatin  20 mg Oral QPM  . sodium chloride flush  3 mL Intravenous Q12H   Continuous Infusions: . sodium chloride 250 mL (02/06/20 1422)    Procedures/Studies: CT CHEST WO CONTRAST  Result Date: 02/06/2020 CLINICAL DATA:  Worsening airspace disease. Dyspnea. Multifocal pneumonia. EXAM: CT CHEST WITHOUT CONTRAST TECHNIQUE: Multidetector CT imaging of the chest was performed following the standard protocol without IV contrast. COMPARISON:  02/04/2019 FINDINGS: Cardiovascular: Moderate cardiac enlargement. No pericardial effusion. Aortic atherosclerosis. RCA, LAD coronary artery atherosclerotic calcifications. Mediastinum/Nodes: Normal appearance of the thyroid gland. Filling defect within the trachea is identified, new from previous exam and likely representing aspirated debris or mucus. Normal appearance of the esophagus. No enlarged mediastinal adenopathy. Hilar structures are suboptimally evaluated due to lack of IV contrast material. Lungs/Pleura: New small right pleural effusion and moderate left pleural effusion noted with overlying areas of compressive type atelectasis. Interval  development of extensive bilateral upper lobe airspace opacities, right greater than left. Imaging findings are concerning for multifocal pneumonia. Upper Abdomen: No acute findings identified within the imaged portions of the upper abdomen. Previous cholecystectomy with chronic dilatation of the CBD. Musculoskeletal: Degenerative disc disease identified within the lower thoracic and visualized portions of the upper lumbar spine. IMPRESSION: 1. Interval development of extensive bilateral upper lobe airspace opacities, right greater than left. Findings are concerning for multifocal pneumonia. 2. New small right pleural effusion and moderate left pleural effusion with overlying areas of compressive type atelectasis. 3. Aortic atherosclerosis, in addition to RCA and LAD coronary  artery disease. Please note that although the presence of coronary artery calcium documents the presence of coronary artery disease, the severity of this disease and any potential stenosis cannot be assessed on this non-gated CT examination. Assessment for potential risk factor modification, dietary therapy or pharmacologic therapy may be warranted, if clinically indicated. Aortic Atherosclerosis (ICD10-I70.0). Electronically Signed   By: Kerby Moors M.D.   On: 02/06/2020 12:21   DG CHEST PORT 1 VIEW  Result Date: 02/13/2020 CLINICAL DATA:  Respiratory failure. EXAM: PORTABLE CHEST 1 VIEW COMPARISON:  Chest x-ray 02/11/2020.  Chest CT 02/06/2020. FINDINGS: Mediastinum hilar structures normal. Stable cardiomegaly. No pulmonary venous congestion. Multifocal bilateral pulmonary infiltrates again noted. Small bilateral pleural effusions again noted. Similar findings noted on prior exam. No pneumothorax. IMPRESSION: 1. Multifocal bilateral pulmonary infiltrates and small bilateral pleural effusions again noted. Similar findings noted on prior exam. 2.  Stable cardiomegaly. Electronically Signed   By: Marcello Moores  Register   On: 02/13/2020 05:46    DG CHEST PORT 1 VIEW  Result Date: 02/11/2020 CLINICAL DATA:  Dyspnea, right breast cancer, dementia EXAM: PORTABLE CHEST 1 VIEW COMPARISON:  02/05/2020 chest radiograph. FINDINGS: Stable cardiomediastinal silhouette with mild cardiomegaly. No pneumothorax. Small bilateral pleural effusions, decreased on the right and stable on the left. Patchy opacities throughout the upper lungs bilaterally, not substantially changed. Surgical clips overlie the right axilla. IMPRESSION: 1. Patchy opacities throughout the upper lungs bilaterally, not substantially changed, compatible with multilobar pneumonia. 2. Small bilateral pleural effusions, decreased on the right and stable on the left. Electronically Signed   By: Ilona Sorrel M.D.   On: 02/11/2020 05:10   DG Chest Port 1 View  Result Date: 02/05/2020 CLINICAL DATA:  Low O2 sats, altered mental status, hyperglycemia EXAM: PORTABLE CHEST 1 VIEW COMPARISON:  12/16/2019 FINDINGS: Patchy bilateral airspace opacities have progressed since prior study concerning for multifocal pneumonia. Mild cardiomegaly. Small right pleural effusion. No pneumothorax or acute bony abnormality. IMPRESSION: Worsening patchy bilateral airspace disease compatible with multifocal pneumonia. Small right effusion. Electronically Signed   By: Rolm Baptise M.D.   On: 02/05/2020 11:11   DG Swallowing Func-Speech Pathology  Result Date: 02/13/2020 Objective Swallowing Evaluation: Type of Study: MBS-Modified Barium Swallow Study  Patient Details Name: Amy Hoffman MRN: [id number] Date of Birth: 10-Aug-1941 Today's Date: 02/13/2020 Time: SLP Start Time (ACUTE ONLY): 1330 -SLP Stop Time (ACUTE ONLY): [id number] SLP Time Calculation (min) (ACUTE ONLY): 35 min Past Medical History: Past Medical History: Diagnosis Date . Abnormal uterine and vaginal bleeding, unspecified  . Allergic rhinitis due to pollen  . Anxiety  . Asthma  . Cancer [facility])   breast cancer - right . Dementia ([place])  . Depression  .  Depression  . Diabetes mellitus without complication ([place])  . Essential (primary) hypertension  . Hyperlipidemia, unspecified  . Hypertension  . Major depressive disorder, single episode, unspecified  . Obesity, unspecified  . Pain in joint involving shoulder region  . Reflux esophagitis  . Trigger finger, acquired  . Type 2 diabetes mellitus with hyperglycemia ([place])  . Unspecified asthma with (acute) exacerbation  . Unspecified asthma, uncomplicated  . 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 ([place])  . Unspecified osteoarthritis, unspecified site  Past Surgical History: Past Surgical History: Procedure Laterality Date . CHOLECYSTECTOMY   . MASTECTOMY Right  . RE-EXCISION OF BREAST LUMPECTOMY   HPI: 79 year old female with a history of dementia, diabetes mellitus type 2, hypertension, hyperlipidemia, asthma, right-sided breast cancer presenting with shortness of breath that was noticed  by her family friends during a home visit.  Unfortunately, the patient is unable to provide any history at this time secondary to her dementia and encephalopathy.  In addition, there was concern for the patient's increased somnolence and drowsiness.  As result, the patient was brought to the emergency department for further evaluation.  In the ED, the patient remained somewhat drowsy with oxygen saturation in the 60% range.  The patient was placed on 5 L nasal cannula with oxygen saturation up to 98%.  Chest x-ray showed bilateral patchy infiltrates and increased interstitial markings.  WBC was 8.5.  BMP showed a potassium 3.0 with serum creatinine 0.70.  Notably, the patient was recently mated to the hospital from 12/16/2019 to 12/22/2019 secondary to respiratory failure from asthma exacerbation.  She also had atrial fibrillation with RVR at that time and was started on amiodarone and apixaban.  The patient was discharged to skilled nursing facility, [facility], where she stayed until up to 2 weeks ago when she returned home. Pt seen  during this recent hospitalization by ST and was briefly on NTL but was d/c on a D3/thin diet with recommendation for meds whole with puree.  Subjective: "I think I'm awake." Assessment / Plan / Recommendation CHL IP CLINICAL IMPRESSIONS 02/13/2020 Clinical Impression Pt presents with mild pharyngeal phase dysphagia negatively impacted by lethargy and cognitive deficits characterized by reduced attention to task with reduced labial closure and bolus manipulation with liquids (pt unable to safely take thins from the cup and pill was expectorated after presentation whole in puree and NTL), premature spillage of liquids to the level of the pyriforms resulting in penetration/aspiration of thins during and after the swallow from residuals on subsequent swallows in trace amount to which Pt was variably sensate to. Pt with seemingly adequate hyolaryngeal excursion and tongue base retraction with no residuals with puree and mech soft textures. Pt with trace under epiglottic coating of thins which eventually moved to vocal folds and were aspirated in trace amounts. Pt was unable to follow directions for strategies this date due to lethargy. NTL were penetrated in trace amounts and no observable aspiration observed (it appeared that she aspirated thin residuals during NTL presentations). Recommend D3/mech soft and NTL via cup/straw/spoon with aspiration and reflux precautions with trials of thins with SLP in SNF when Pt is consistently alert.  SLP Visit Diagnosis Dysphagia, oropharyngeal phase (R13.12) Attention and concentration deficit following -- Frontal lobe and executive function deficit following -- Impact on safety and function Moderate aspiration risk   CHL IP TREATMENT RECOMMENDATION 02/13/2020 Treatment Recommendations Therapy as outlined in treatment plan below   Prognosis 02/13/2020 Prognosis for Safe Diet Advancement Fair Barriers to Reach Goals Cognitive deficits Barriers/Prognosis Comment -- CHL IP DIET  RECOMMENDATION 02/13/2020 SLP Diet Recommendations Dysphagia 3 (Mech soft) solids;Nectar thick liquid Liquid Administration via Cup;Straw;Spoon Medication Administration Crushed with puree Compensations Minimize environmental distractions;Slow rate;Follow solids with liquid Postural Changes Remain semi-upright after after feeds/meals (Comment);Seated upright at 90 degrees   CHL IP OTHER RECOMMENDATIONS 02/13/2020 Recommended Consults -- Oral Care Recommendations Oral care BID;Staff/trained caregiver to provide oral care Other Recommendations Order thickener from pharmacy;Remove water pitcher;Clarify dietary restrictions   CHL IP FOLLOW UP RECOMMENDATIONS 02/13/2020 Follow up Recommendations Skilled Nursing facility   [facility] IP FREQUENCY AND DURATION 02/13/2020 Speech Therapy Frequency (ACUTE ONLY) min 2x/week Treatment Duration 1 week      CHL IP ORAL PHASE 02/13/2020 Oral Phase Impaired Oral - Pudding Teaspoon -- Oral - Pudding Cup -- Oral -  Honey Teaspoon -- Oral - Honey Cup -- Oral - Nectar Teaspoon -- Oral - Nectar Cup -- Oral - Nectar Straw -- Oral - Thin Teaspoon -- Oral - Thin Cup Left anterior bolus loss;Right anterior bolus loss;Weak lingual manipulation Oral - Thin Straw -- Oral - Puree -- Oral - Mech Soft -- Oral - Regular -- Oral - Multi-Consistency -- Oral - Pill Reduced posterior propulsion Oral Phase - Comment Pt drousy today and had difficulty manipulating bolus due to same  CHL IP PHARYNGEAL PHASE 02/13/2020 Pharyngeal Phase Impaired Pharyngeal- Pudding Teaspoon -- Pharyngeal -- Pharyngeal- Pudding Cup -- Pharyngeal -- Pharyngeal- Honey Teaspoon -- Pharyngeal -- Pharyngeal- Honey Cup -- Pharyngeal -- Pharyngeal- Nectar Teaspoon Delayed swallow initiation-pyriform sinuses Pharyngeal -- Pharyngeal- Nectar Cup Delayed swallow initiation-pyriform sinuses;Pharyngeal residue - valleculae Pharyngeal -- Pharyngeal- Nectar Straw -- Pharyngeal -- Pharyngeal- Thin Teaspoon Delayed swallow initiation-pyriform  sinuses;Penetration/Aspiration during swallow;Penetration/Apiration after swallow;Trace aspiration;Pharyngeal residue - valleculae Pharyngeal Material enters airway, passes BELOW cords without attempt by patient to eject out (silent aspiration);Material enters airway, passes BELOW cords and not ejected out despite cough attempt by patient;Material enters airway, passes BELOW cords then ejected out Pharyngeal- Thin Cup NT Pharyngeal -- Pharyngeal- Thin Straw Delayed swallow initiation-pyriform sinuses;Penetration/Aspiration before swallow;Penetration/Aspiration during swallow;Trace aspiration;Pharyngeal residue - valleculae Pharyngeal -- Pharyngeal- Puree Delayed swallow initiation-vallecula Pharyngeal -- Pharyngeal- Mechanical Soft Delayed swallow initiation-vallecula Pharyngeal -- Pharyngeal- Regular -- Pharyngeal -- Pharyngeal- Multi-consistency -- Pharyngeal -- Pharyngeal- Pill NT Pharyngeal -- Pharyngeal Comment Suspect lethargy and cognitive impairment impacted pharyngeal swallow  CHL IP CERVICAL ESOPHAGEAL PHASE 02/13/2020 Cervical Esophageal Phase WFL Pudding Teaspoon -- Pudding Cup -- Honey Teaspoon -- Honey Cup -- Nectar Teaspoon -- Nectar Cup -- Nectar Straw -- Thin Teaspoon -- Thin Cup -- Thin Straw -- Puree -- Mechanical Soft -- Regular -- Multi-consistency -- Pill -- Cervical Esophageal Comment -- Thank you, Genene Churn, [place] PORTER,DABNEY 02/13/2020, 6:30 PM               Kathie Dike, MD  [facility]  If 7PM-7AM, please contact night-coverage [email or web address]  02/13/2020, 8:03 PM   LOS: 8 days

## 2020-02-13 NOTE — Progress Notes (Deleted)
Cardiology Office Note    Date:  02/13/2020   ID:  Amy Hoffman, DOB 08-Aug-1941, MRN [id number]  PCP:  Amy Albee, MD  Cardiologist: Amy Dolly, MD EPS: None  No chief complaint on file.   History of Present Illness:  Amy Hoffman is a 79 y.o. female with history of hypertension, DM, anxiety depression, dementia, breast CA   Patient came to the emergency room with atrial flutter with RVR in the setting of pneumonia and asthma exacerbation, failed rate control with diltiazem drip and started on amiodarone drip and Eliquis for stroke prevention. She converted to normal sinus rhythm next day and was discharged home on amiodarone load.  Echo 12/2019 LVEF 50 to 55%.  I saw the patient 01/08/20 and doing well without cardiac complaints.Was in NSR.  Patient went back to ED 02/11/20 with shortness of breath O2 sats 60%, encephalopathy felt secondary to CHF & HCAP. She was seen by palliative care    Past Medical History:  Diagnosis Date   Abnormal uterine and vaginal bleeding, unspecified    Allergic rhinitis due to pollen    Anxiety    Asthma    Cancer ([place])    breast cancer - right   Dementia ([place])    Depression    Depression    Diabetes mellitus without complication ([place])    Essential (primary) hypertension    Hyperlipidemia, unspecified    Hypertension    Major depressive disorder, single episode, unspecified    Obesity, unspecified    Pain in joint involving shoulder region    Reflux esophagitis    Trigger finger, acquired    Type 2 diabetes mellitus with hyperglycemia ([place])    Unspecified asthma with (acute) exacerbation    Unspecified asthma, uncomplicated    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 (HCC)    Unspecified osteoarthritis, unspecified site     Past Surgical History:  Procedure Laterality Date   CHOLECYSTECTOMY     MASTECTOMY Right    RE-EXCISION OF BREAST LUMPECTOMY      Current Medications: No  outpatient medications have been marked as taking for the 02/26/20 encounter (Appointment) with Amy Burn, PA-C.     Allergies:   Codeine and Zantac [ranitidine hcl]   Social History   Socioeconomic History   Marital status: Married    Spouse name: Not on file   Number of children: Not on file   Years of education: Not on file   Highest education level: Not on file  Occupational History   Not on file  Tobacco Use   Smoking status: Never Smoker   Smokeless tobacco: Never Used  Substance and Sexual Activity   Alcohol use: No   Drug use: No   Sexual activity: Not on file  Other Topics Concern   Not on file  Social History Narrative   Not on file   Social Determinants of Health   Financial Resource Strain:    Difficulty of Paying Living Expenses:   Food Insecurity:    Worried About Charity fundraiser in the Last Year:    Arboriculturist in the Last Year:   Transportation Needs:    Film/video editor (Medical):    Lack of Transportation (Non-Medical):   Physical Activity:    Days of Exercise per Week:    Minutes of Exercise per Session:   Stress:    Feeling of Stress :   Social Connections:    Frequency of Communication with Friends  and Family:    Frequency of Social Gatherings with Friends and Family:    Attends Religious Services:    Active Member of Clubs or Organizations:    Attends Archivist Meetings:    Marital Status:      Family History:  The patient's ***Family history is unknown by patient.   ROS:   Please see the history of present illness.    ROS All other systems reviewed and are negative.   PHYSICAL EXAM:   VS:  There were no vitals taken for this visit.  Physical Exam  GEN: Well nourished, well developed, in no acute distress  HEENT: normal  Neck: no JVD, carotid bruits, or masses Cardiac:RRR; no murmurs, rubs, or gallops  Respiratory:  clear to auscultation bilaterally, normal work of  breathing GI: soft, nontender, nondistended, + BS Ext: without cyanosis, clubbing, or edema, Good distal pulses bilaterally MS: no deformity or atrophy  Skin: warm and dry, no rash Neuro:  Alert and Oriented x 3, Strength and sensation are intact Psych: euthymic mood, full affect  Wt Readings from Last 3 Encounters:  02/05/20 150 lb (68 kg)  01/08/20 134 lb (60.8 kg)  12/19/19 147 lb 14.9 oz (67.1 kg)      Studies/Labs Reviewed:   EKG:  EKG is*** ordered today.  The ekg ordered today demonstrates ***  Recent Labs: 02/06/2020: B Natriuretic Peptide 172.0; TSH 1.503 02/09/2020: Magnesium 1.8 02/12/2020: ALT 12 02/13/2020: BUN 21; Creatinine, Ser 0.68; Hemoglobin 10.7; Platelets 267; Potassium 3.9; Sodium 143   Lipid Panel    Component Value Date/Time   CHOL 154 11/29/2018 0000   TRIG 41 11/29/2018 0000   HDL 76 (A) 11/29/2018 0000   LDLCALC 66 11/29/2018 0000    Additional studies/ records that were reviewed today include:   Echo 12/17/19 IMPRESSIONS     1. Left ventricular ejection fraction, by estimation, is 50 to 55%. The  left ventricle has low normal function. The left ventricle has no regional  wall motion abnormalities. There is mild concentric left ventricular  hypertrophy. Left ventricular  diastolic function could not be evaluated.   2. Right ventricular systolic function is normal. The right ventricular  size is normal. There is normal pulmonary artery systolic pressure. The  estimated right ventricular systolic pressure is [id number] mmHg.   3. Left atrial size was mildly dilated.   4. The mitral valve is normal in structure. Trivial mitral valve  regurgitation.   5. The aortic valve is normal in structure. Aortic valve regurgitation is  not visualized. No aortic stenosis is present.   6. The inferior vena cava is dilated in size with <50% respiratory  variability, suggesting right atrial pressure of 15 mmHg.   Comparison(s): Prior images unable to be directly  viewed, comparison made  by report only. No significant change from prior study.   FINDINGS   Left Ventricle: Left ventricular ejection fraction, by estimation, is 50  to 55%. The left ventricle has low normal function. The left ventricle has  no regional wall motion abnormalities. The left ventricular internal  cavity size was normal in size.  There is mild concentric left ventricular hypertrophy. Left ventricular  diastolic function could not be evaluated due to atrial fibrillation. Left  ventricular diastolic function could not be evaluated.   Right Ventricle: The right ventricular size is normal. No increase in  right ventricular wall thickness. Right ventricular systolic function is  normal. There is normal pulmonary artery systolic pressure. The tricuspid  regurgitant  velocity is 1.90 m/s, and   with an assumed right atrial pressure of 15 mmHg, the estimated right  ventricular systolic pressure is [id number] mmHg.   Left Atrium: Left atrial size was mildly dilated.   Right Atrium: Right atrial size was normal in size.   Pericardium: There is no evidence of pericardial effusion.   Mitral Valve: The mitral valve is normal in structure. Trivial mitral  valve regurgitation.   Tricuspid Valve: The tricuspid valve is normal in structure. Tricuspid  valve regurgitation is trivial.   Aortic Valve: The aortic valve is normal in structure. Aortic valve  regurgitation is not visualized. No aortic stenosis is present.   Pulmonic Valve: The pulmonic valve was not well visualized. Pulmonic valve  regurgitation is not visualized.   Aorta: The aortic root is normal in size and structure.   Venous: The inferior vena cava is dilated in size with less than 50%  respiratory variability, suggesting right atrial pressure of 15 mmHg.   IAS/Shunts: No atrial level shunt detected by color flow Doppler.      ECHO 01/10/14 Study Conclusions   - Left ventricle: The cavity size was normal. Wall  thickness    was increased in a pattern of mild LVH. Systolic function    was normal. The estimated ejection fraction was in the    range of 50% to 55%. Wall motion was normal; there were no    regional wall motion abnormalities. Doppler parameters are    consistent with abnormal left ventricular relaxation    (grade 1 diastolic dysfunction). Doppler parameters are    consistent with elevated mean left atrial filling    pressure.  - Left atrium: The atrium was mildly dilated.  - Tricuspid valve: Mild regurgitation.  - Pulmonary arteries: PA peak pressure: 30mm Hg (S). Mildly    elevated pulmonary pressures.        ASSESSMENT:    No diagnosis found.   PLAN:  In order of problems listed above:  Atrial flutter with RVR in the setting of pneumonia 12/2019 converted to normal sinus rhythm with amiodarone, started on Eliquis for stroke prevention.  2D echo 12/2019 LVEF 50 to 55%.  Maintained NSR during recent hospitalization  Diastolic CHF on recent admission   Reactive airway disease/asthma exacerbation and pneumonia with readmission 02/11/20 with HCAP and CHF   Hyperlipidemia on Zocor   Dementia   DM type II managed by PCP     Medication Adjustments/Labs and Tests Ordered: Current medicines are reviewed at length with the patient today.  Concerns regarding medicines are outlined above.  Medication changes, Labs and Tests ordered today are listed in the Patient Instructions below. There are no Patient Instructions on file for this visit.   Signed, Ermalinda Barrios, PA-C  02/13/2020 7:25 AM    [facility] [address], [place], [place]  [address] Phone: [phone]; Fax: [phone]

## 2020-02-13 NOTE — Progress Notes (Signed)
Palliative:  HPI:  79 y.o. female  with past medical history of dementia, DM2, HTN, HLD, asthma, depression, remote breast cancer, admitted on 02/05/2020 with pneumonia- likely aspiration, CHF exacerbation with pulmonary edema, metabolic encephalopathy. She is not eating or drinking, not taking po medications- palliative medicine consulted for goals of care. Intake has been improving over course of hospitalization.   I visited today with Amy Hoffman. She is confused but verbal and oriented to self and knows she is in [place] (but not that she is in the hospital). She was speaking aloud when I enter the room although no family/visitors at bedside. She answers questions somewhat appropriately although with underlying confusion which is likely baseline due to her dementia. She is eating ~50% of meals per nursing and taking po medication without issue. She continues to require 3L nasal cannula. Will likely require SNF with palliative to follow. Per my colleague family was considering home with hospice if she did not show improvement but she has had some improvement.   I returned later in the day and still no family at bedside. No distress. No acute palliative concerns. Recommend palliative to follow at SNF.   Emotional support provided to patient. Discussed plan of care with bedside RN and Dr. Roderic Palau.   Exam: Alert, confused. Oriented to self only. No distress. HR regular rate and rhythm. Breathing regular, unlabored on 3L nasal cannula. Abd soft, flat. No edema. Extremities warm to touch.   Plan: - SNF with palliative.   15 min  Vinie Sill, NP Palliative Medicine Team Pager [phone] (Please see [email or web address] for schedule) Team Phone [phone]    Greater than 50%  of this time was spent counseling and coordinating care related to the above assessment and plan

## 2020-02-13 NOTE — Plan of Care (Signed)
  Problem: Acute Rehab PT Goals(only PT should resolve) Goal: Pt Will Go Supine/Side To Sit Outcome: Progressing Flowsheets (Taken 02/13/2020 1326) Pt will go Supine/Side to Sit: with minimal assist Goal: Pt Will Go Sit To Supine/Side Outcome: Progressing Flowsheets (Taken 02/13/2020 1326) Pt will go Sit to Supine/Side: with minimal assist Goal: Patient Will Transfer Sit To/From Stand Outcome: Progressing Flowsheets (Taken 02/13/2020 1326) Patient will transfer sit to/from stand: with minimal assist Goal: Pt Will Transfer Bed To Chair/Chair To Bed Outcome: Progressing Flowsheets (Taken 02/13/2020 1326) Pt will Transfer Bed to Chair/Chair to Bed: with min assist Goal: Pt Will Ambulate Outcome: Progressing Flowsheets (Taken 02/13/2020 1326) Pt will Ambulate:  25 feet  with minimal assist  with rolling walker   Tori Ashle Stief PT, DPT 02/13/20, 1:26 PM [phone]

## 2020-02-13 NOTE — TOC Progression Note (Addendum)
Transition of Care [facility]) - Progression Note    Patient Details  Name: ELEASHA RESTO MRN: [id number] Date of Birth: Jun 08, 1941  Transition of Care [facility]) CM/SW Contact  Boneta Lucks, RN Phone Number: 02/13/2020, 3:56 PM  Clinical Narrative:   [facility] made a bed offer. TOC spoke with her husband, he accepted bed offer. He confirmed she has had both COVID shots. He will take the card to [facility] for them to copy. Kristin Bruins is aware PASSR is pending. MD will order COVID test.  TOC referred to [facility] hospice to follow at [facility] for Palliative. TOC will update Cassandra with dc time.   Daughter - Denny Peon called back, TOC updated her with DC plan. She will call [facility] to get visitation policy and take belonging needed. She understands Palliative will be following.  Expected Discharge Plan: [place] Barriers to Discharge: Continued Medical Work up  Expected Discharge Plan and Services Expected Discharge Plan: [place] Choice: [place] arrangements for the past 2 months: Single Family Home                    Readmission Risk Interventions Readmission Risk Prevention Plan 02/13/2020 02/06/2020  Transportation Screening Complete Complete  Medication Review Press photographer) Complete Complete  PCP or Specialist appointment within 3-5 days of discharge Not Complete Not Complete  HRI or Home Care Consult Not Complete -  SW Recovery Care/Counseling Consult Complete -  Palliative Care Screening Complete -  Skilled Nursing Facility Complete -  Some recent data might be hidden

## 2020-02-14 ENCOUNTER — Inpatient Hospital Stay
Admission: RE | Admit: 2020-02-14 | Discharge: 2020-06-30 | Payer: Medicare Other | Source: Ambulatory Visit | Attending: Internal Medicine | Admitting: Internal Medicine

## 2020-02-14 LAB — BASIC METABOLIC PANEL
Anion gap: 8 (ref 5–15)
BUN: 27 mg/dL — ABNORMAL HIGH (ref 8–23)
CO2: 36 mmol/L — ABNORMAL HIGH (ref 22–32)
Calcium: 8.5 mg/dL — ABNORMAL LOW (ref 8.9–10.3)
Chloride: 98 mmol/L (ref 98–111)
Creatinine, Ser: 0.8 mg/dL (ref 0.44–1.00)
GFR calc Af Amer: >60 mL/min (ref >60)
GFR calc non Af Amer: >60 mL/min (ref >60)
Glucose, Bld: 125 mg/dL — ABNORMAL HIGH (ref 70–99)
Potassium: 3.9 mmol/L (ref 3.5–5.1)
Sodium: 142 mmol/L (ref 135–145)

## 2020-02-14 LAB — CBC
HCT: 29.7 % — ABNORMAL LOW (ref 36.0–46.0)
Hemoglobin: 9 g/dL — ABNORMAL LOW (ref 12.0–15.0)
MCH: 28.8 pg (ref 26.0–34.0)
MCHC: 30.3 g/dL (ref 30.0–36.0)
MCV: 94.9 fL (ref 80.0–100.0)
Platelets: 245 10*3/uL (ref 150–400)
RBC: 3.13 MIL/uL — ABNORMAL LOW (ref 3.87–5.11)
RDW: 14.2 % (ref 11.5–15.5)
WBC: 5.8 10*3/uL (ref 4.0–10.5)
nRBC: 0 % (ref 0.0–0.2)

## 2020-02-14 LAB — GLUCOSE, CAPILLARY
Glucose-Capillary: 132 mg/dL — ABNORMAL HIGH (ref 70–99)
Glucose-Capillary: 114 mg/dL — ABNORMAL HIGH (ref 70–99)
Glucose-Capillary: 213 mg/dL — ABNORMAL HIGH (ref 70–99)

## 2020-02-14 MED ORDER — METOPROLOL TARTRATE 25 MG PO TABS: 12.5000 mg | ORAL_TABLET | Freq: Two times a day (BID) | ORAL | 2 refills | Status: DC

## 2020-02-14 MED ORDER — ACETAMINOPHEN 325 MG PO TABS: 650.0000 mg | ORAL_TABLET | Freq: Four times a day (QID) | ORAL | 0 refills | Status: AC | PRN

## 2020-02-14 MED ORDER — POLYETHYLENE GLYCOL 3350 17 G PO PACK: 17.0000 g | PACK | Freq: Every day | ORAL | 0 refills | Status: DC | PRN

## 2020-02-14 MED ORDER — RISPERIDONE 0.5 MG PO TABS: 0.5000 mg | ORAL_TABLET | Freq: Every day | ORAL | 1 refills | Status: DC

## 2020-02-14 MED ORDER — SODIUM CHLORIDE 0.9 % IV SOLN
250.0000 mL | INTRAVENOUS | Status: DC | PRN
  Administered 2020-02-14: 250 mL via INTRAVENOUS

## 2020-02-14 MED ORDER — SODIUM CHLORIDE 0.9 % IV SOLN: 250.0000 mL | INTRAVENOUS | Status: DC | PRN

## 2020-02-14 MED ORDER — INSULIN LISPRO (1 UNIT DIAL) 100 UNIT/ML (KWIKPEN): 0.0000 [IU] | PEN_INJECTOR | Freq: Three times a day (TID) | SUBCUTANEOUS | 11 refills | Status: DC

## 2020-02-14 MED ORDER — GLIMEPIRIDE 4 MG PO TABS: 4.0000 mg | ORAL_TABLET | Freq: Every day | ORAL | 1 refills | Status: DC

## 2020-02-14 NOTE — Care Management Important Message (Signed)
Important Message  Patient Details  Name: Amy Hoffman MRN: [id number] Date of Birth: [birth year]   Medicare Important Message Given:  Yes     Tommy Medal 02/14/2020, 12:30 PM

## 2020-02-14 NOTE — Clinical Social Work Note (Signed)
CSW faxed over discharge summary and discharge orders to [facility]. CSW called Marianna Fuss to get number to call for report. Number for report is [phone]. RN updated. TOC signing off.  Tobi Bastos, LCSW Transitions of Care Clinical Social Worker [facility] Ph: [phone]

## 2020-02-14 NOTE — Discharge Instructions (Signed)
1)insulin aspart (novoLOG) injection 0-6 Units  0-6 Units Subcutaneous, 3 times daily with meals  CBG < 70: Implement Hypoglycemia Standing Orders and refer to Hypoglycemia Standing Orders sidebar report   CBG 70 - 120: 0 unit CBG 121 - 150: 0 unit  CBG 151 - 200: 0 unit  CBG 201 - 250:   0 units  CBG 251 - 300:  2 units  CBG 301 - 350:   4 units   CBG 351 - 400:   6 units  CBG > 400:  6 units  2)Oxygen via nasal cannula continuously 2 L/min to keep O2 sats above 92%----please reevaluate patient on a weekly basis to see if oxygen is still required, prior to admission patient was not on home oxygen  3)Patient is very sensitive to benzodiazepines, avoid lorazepam or all of benzos if possible as patient becomes sedated for prolonged periods of time with benzos  4)Diet Recommendations: Dysphagia 3 (Mech soft) solids;Nectar thick liquids----please have weekly speech reevaluation to see if patient's diet can be advanced

## 2020-02-14 NOTE — TOC Transition Note (Signed)
Transition of Care [facility]) - CM/SW Discharge Note   Patient Details  Name: LIZETT RYMER MRN: [id number] Date of Birth: 07/18/1941  Transition of Care [facility]) CM/SW Contact:  Boneta Lucks, RN Phone Number: 02/14/2020, 12:02 PM   Clinical Narrative:   Patient is medically ready for transport to [facility]. Keri provided number for report, RN updated. Clinicals sent in the hub.     Final next level of care: Skilled Nursing Facility Barriers to Discharge: Barriers Resolved   Patient Goals and CMS Choice Patient states their goals for this hospitalization and ongoing recovery are:: Family wants SNF and then [facility]   Choice offered to / list presented to : Adult Children  Discharge Placement              Patient chooses bed at: [facility] Patient to be transferred to facility by: [facility] Staff   Patient and family notified of of transfer: 02/14/20  Discharge Plan and Services     Post Acute Care Choice: [place]                Readmission Risk Interventions Readmission Risk Prevention Plan 02/13/2020 02/06/2020  Transportation Screening Complete Complete  Medication Review Press photographer) Complete Complete  PCP or Specialist appointment within 3-5 days of discharge Not Complete Not Complete  HRI or Home Care Consult Not Complete -  SW Recovery Care/Counseling Consult Complete -  Palliative Care Screening Complete -  [place] Complete -  Some recent data might be hidden

## 2020-02-14 NOTE — Progress Notes (Signed)
Very lethargic until this afternoon.  Did awaken to name this morning but went right back to sleep.  Husband and granddaughter tried to awaken also.  Now awake and says name and that she is in hospital.  Took crushed meds in pudding with no difficulty.  Midline removed with some bleeding which stopped after a few minutes of pressure.  Dressing now clean and dry.  .  Report called to Maudie Mercury at [facility].

## 2020-02-14 NOTE — Discharge Summary (Signed)
Amy Hoffman, is a 79 y.o. female  DOB 09-16-1940  MRN [id number].  Admission date:  02/05/2020  Admitting Physician  Kathie Dike, MD  Discharge Date:  02/14/2020   Primary MD  No primary care provider on file.  Recommendations for primary care physician for things to follow:   1)insulin aspart (novoLOG) injection 0-6 Units  0-6 Units Subcutaneous, 3 times daily with meals  CBG < 70: Implement Hypoglycemia Standing Orders and refer to Hypoglycemia Standing Orders sidebar report   CBG 70 - 120: 0 unit CBG 121 - 150: 0 unit  CBG 151 - 200: 0 unit  CBG 201 - 250:   0 units  CBG 251 - 300:  2 units  CBG 301 - 350:   4 units   CBG 351 - 400:   6 units  CBG > 400:  6 units  2)Oxygen via nasal cannula continuously 2 L/min to keep O2 sats above 92%----please reevaluate patient on a weekly basis to see if oxygen is still required, prior to admission patient was not on home oxygen  3)Patient is very sensitive to benzodiazepines, avoid lorazepam or all of benzos if possible as patient becomes sedated for prolonged periods of time with benzos  4)Diet Recommendations: Dysphagia 3 (Mech soft) solids;Nectar thick liquids----please have weekly speech reevaluation to see if patient's diet can be advanced  Admission Diagnosis  Hypokalemia [E87.6] Hypoxia [R09.02] Healthcare-associated pneumonia [J18.9] Low oxygen saturation [R79.81] Palliative care patient [Z51.5] Multifocal pneumonia [J18.9]  Discharge Diagnosis  Hypokalemia [E87.6] Hypoxia [R09.02] Healthcare-associated pneumonia [J18.9] Low oxygen saturation [R79.81] Palliative care patient [Z51.5] Multifocal pneumonia [J18.9]    Principal Problem:   Acute respiratory failure with hypoxia ([place]) Active Problems:   SOB (shortness of breath)   Essential (primary) hypertension   Atrial fibrillation ([place])   Community acquired pneumonia   Dementia with  behavioral disturbance (HCC)   Multifocal pneumonia   CHF (congestive heart failure) (HCC)   Hypokalemia   Hypoxia   Low oxygen saturation   Acute respiratory failure with hypoxia and hypercarbia (HCC)   Acute diastolic CHF (congestive heart failure) (HCC)   Acute metabolic encephalopathy   Do not resuscitate status      Past Medical History:  Diagnosis Date  . Abnormal uterine and vaginal bleeding, unspecified   . Allergic rhinitis due to pollen   . Anxiety   . Asthma   . Cancer [facility])    breast cancer - right  . Dementia ([place])   . Depression   . Depression   . Diabetes mellitus without complication ([place])   . Essential (primary) hypertension   . Hyperlipidemia, unspecified   . Hypertension   . Major depressive disorder, single episode, unspecified   . Obesity, unspecified   . Pain in joint involving shoulder region   . Reflux esophagitis   . Trigger finger, acquired   . Type 2 diabetes mellitus with hyperglycemia ([place])   . Unspecified asthma with (acute) exacerbation   . Unspecified asthma, uncomplicated   . Unspecified dementia without  behavioral disturbance ([place])   . Unspecified osteoarthritis, unspecified site     Past Surgical History:  Procedure Laterality Date  . CHOLECYSTECTOMY    . MASTECTOMY Right   . RE-EXCISION OF BREAST LUMPECTOMY      HPI  from the history and physical done on the day of admission:    Chief Complaint: AMS/ drowsy, ^^CBG/ SOB HPI: The patient is a 79 y.o. year-old w/ hx of NIDDM, dementia, HTN, HL, depression, asthma who presents w/ SOB and some dec'd MS/ drowsiness. In ED SpO2 very low in the 60's on RA.  Improved on nasal flow O2.  CXR shows bilat infiltrates. Given IV abx for suspected PNA, asked to see for admission.   Pt was admitted here in April for SOB and hypoxemia w/ resp distress. CXR showed bilat infiltrates, pt admitted and treated as PNA/ asthma. Has afib / RVR rx'd w/ po amiodarone. Palliative care saw pt and she was made  a DNR. Perhaps pt went to SNF at dc.   Pt is vague historian.  No c/o at this time.  SpO2 is 96% on nasal O2 at 5 lpm.  Denies any CP, prod cough , fevers or chills.     Hospital Course:    Brief History:  79 year old female with a history of dementia, diabetes mellitus type 2, hypertension, hyperlipidemia, asthma, right-sided breast cancer presenting with shortness of breath that was noticed by her family friends during a home visit.  Unfortunately, the patient is unable to provide any history at this time secondary to her dementia and encephalopathy.  In addition, there was concern for the patient's increased somnolence and drowsiness.  As result, the patient was brought to the emergency department for further evaluation.  In the ED, the patient remained somewhat drowsy with oxygen saturation in the 60% range.  The patient was placed on 5 L nasal cannula with oxygen saturation up to 98%.  Chest x-ray showed bilateral patchy infiltrates and increased interstitial markings.  WBC was 8.5.  BMP showed a potassium 3.0 with serum creatinine 0.70.  Notably, the patient was recently mated to the hospital from 12/16/2019 to 12/22/2019 secondary to respiratory failure from asthma exacerbation.  She also had atrial fibrillation with RVR at that time and was started on amiodarone and apixaban.  The patient was discharged to skilled nursing facility, [facility], where she stayed until up to 2 weeks ago when she returned home. -Readmitted to [facility] on 02/05/2020 with shortness of breath and found to have extensive multifocal bilateral pneumonia with New hypoxia  Assessment/Plan: Acute respiratory failure with hypoxia and hypercarbia -Secondary to pneumonia and presumed CHF -Hypoxia-improved but not resolved -Requiring 2 L of oxygen at this time to maintain O2 sats above [id number]  Acute metabolic encephalopathy -Suspect due to pneumonia and CHF with new onset hypoxia -Serum B12--725 -TSH--1.503 -UA  negative for pyuria -Ammonia--38 -folate 6.2 -Daytime sleepiness due to benzos and Risperdal -Risperdal reduced to 0.5 mg nightly, lorazepam and anxiolytics have been discontinued  Acute diastolic CHF -She was treated with intravenous Lasix -Overall volume status is better, appears to be approaching euvolemia --12/27/2019 echo EF 50-55%, no WMA, trivial MR  HCAP -CT chest--extensive bilateral upper lobe airspace opacities, right greater than left. New small right pleural effusion and moderate left pleural effusion  -PCT<0.10 -MRSA screen--neg -She has completed a course of cefepime -Speech therapy evaluation appreciated  -MBSS done 6/1 with recommendations for dysphagia 3 with nectar thick liquids.  -she appears to have aspirated  thin liquids  Paroxysmal Atrial fibrillation -Currently in sinus rhythm -Continue apixaban for stroke prophylaxis -Continue amiodarone -She did have an episode of transient atrial fibrillation with RVR. -Continue Lopressor and amiodarone for rate control  Essential hypertension -Continue amlodipine and metoprolol -Blood pressure stable  Dementia with behavioral disturbance -Continue Aricept, rispderdal -Use Ativan as needed for agitation.  Diabetes mellitus type 2 -12/16/2019 hemoglobin A1c 6.8--reflecting excellent diabetic control PTA -NovoLog sliding scale Restart Amaryl at 4 mg every morning  Hyperlipidemia -Continue statin  Goals of care -Seen by palliative care and DNR concurrent -Family wishes to continue with current treatments --  Family Communication:   Discussed with  daughter [name] at the bedside  Consultants: Palliative care and speech pathologist  Code Status:  DNR  DVT Prophylaxis: Apixaban   Procedures: As Listed in Progress Note Above  Antibiotics: vanco 5/24>>5/26 Cefepime 5/24>>5/31  Discharge Condition: Stable Follow UP  Contact information for after-discharge care     [facility] .   Service: Skilled Nursing Contact information: [address] [place] [id number] [phone]              Diet and Activity recommendation:  As advised  Discharge Instructions    Discharge Instructions    Call MD for:  difficulty breathing, headache or visual disturbances   Complete by: As directed    Call MD for:  persistant dizziness or light-headedness   Complete by: As directed    Call MD for:  persistant nausea and vomiting   Complete by: As directed    Call MD for:  severe uncontrolled pain   Complete by: As directed    Call MD for:  temperature >100.4   Complete by: As directed    Diet - low sodium heart healthy   Complete by: As directed    Diet Recommendations: Dysphagia 3 (Mech soft) solids;Nectar thick liquids----please have weekly speech reevaluation to see if patient's diet can be advanced   Discharge instructions   Complete by: As directed    1)insulin aspart (novoLOG) injection 0-6 Units  0-6 Units Subcutaneous, 3 times daily with meals  CBG < 70: Implement Hypoglycemia Standing Orders and refer to Hypoglycemia Standing Orders sidebar report   CBG 70 - 120: 0 unit CBG 121 - 150: 0 unit  CBG 151 - 200: 0 unit  CBG 201 - 250:   0 units  CBG 251 - 300:  2 units  CBG 301 - 350:   4 units   CBG 351 - 400:   6 units  CBG > 400:  6 units  2)Oxygen via nasal cannula continuously 2 L/min to keep O2 sats above 92%----please reevaluate patient on a weekly basis to see if oxygen is still required, prior to admission patient was not on home oxygen  3)Patient is very sensitive to benzodiazepines, avoid lorazepam or all of benzos if possible as patient becomes sedated for prolonged periods of time with benzos  4)Diet Recommendations: Dysphagia 3 (Mech soft) solids;Nectar thick liquids----please have weekly speech reevaluation to see if patient's diet can be advanced    Increase activity  slowly   Complete by: As directed        Discharge Medications     Allergies as of 02/14/2020      Reactions   Codeine    Zantac [ranitidine Hcl]       Medication List    TAKE these medications   acetaminophen 325 MG  tablet Commonly known as: TYLENOL Take 2 tablets (650 mg total) by mouth every 6 (six) hours as needed for mild pain (or Fever >/= 101).   amiodarone 200 MG tablet Commonly known as: PACERONE Take 400mg  twice daily for 5 days, then 200mg  twice daily for 14 days, then 200mg  daily thereafter   amLODipine 5 MG tablet Commonly known as: NORVASC Take 5 mg by mouth daily.   anastrozole 1 MG tablet Commonly known as: ARIMIDEX Take 1 mg by mouth daily.   apixaban 5 MG Tabs tablet Commonly known as: ELIQUIS Take 1 tablet (5 mg total) by mouth 2 (two) times daily.   donepezil 5 MG tablet Commonly known as: ARICEPT Take 5 mg by mouth at bedtime.   glimepiride 4 MG tablet Commonly known as: AMARYL Take 1 tablet (4 mg total) by mouth daily. What changed: how much to take   insulin lispro 100 UNIT/ML KwikPen Commonly known as: HumaLOG KwikPen Inject 0-0.06 mLs (0-6 Units total) into the skin 3 (three) times daily. insulin aspart (novoLOG) injection 0-6 Units  0-6 Units Subcutaneous, 3 times daily with meals  CBG < 70: Implement Hypoglycemia Standing Orders and refer to Hypoglycemia Standing Orders sidebar report   CBG 70 - 120: 0 unit CBG 121 - 150: 0 unit  CBG 151 - 200: 0 unit  CBG 201 - 250:   0 units  CBG 251 - 300:  2 units  CBG 301 - 350:   4 units   CBG 351 - 400:   6 units  CBG > 400:  6 units   metoprolol tartrate 25 MG tablet Commonly known as: LOPRESSOR Take 0.5 tablets (12.5 mg total) by mouth 2 (two) times daily.   MULTIVITAMIN ADULT PO Take 1 tablet by mouth daily.   polyethylene glycol 17 g packet Commonly known as: MIRALAX / GLYCOLAX Take 17 g by mouth daily as needed for mild constipation.   risperiDONE 0.5 MG tablet Commonly known  as: RISPERDAL Take 1 tablet (0.5 mg total) by mouth at bedtime. What changed: when to take this   simvastatin 40 MG tablet Commonly known as: ZOCOR Take 20 mg by mouth every evening.      Major procedures and Radiology Reports - PLEASE review detailed and final reports for all details, in brief -   CT CHEST WO CONTRAST  Result Date: 02/06/2020 CLINICAL DATA:  Worsening airspace disease. Dyspnea. Multifocal pneumonia. EXAM: CT CHEST WITHOUT CONTRAST TECHNIQUE: Multidetector CT imaging of the chest was performed following the standard protocol without IV contrast. COMPARISON:  02/04/2019 FINDINGS: Cardiovascular: Moderate cardiac enlargement. No pericardial effusion. Aortic atherosclerosis. RCA, LAD coronary artery atherosclerotic calcifications. Mediastinum/Nodes: Normal appearance of the thyroid gland. Filling defect within the trachea is identified, new from previous exam and likely representing aspirated debris or mucus. Normal appearance of the esophagus. No enlarged mediastinal adenopathy. Hilar structures are suboptimally evaluated due to lack of IV contrast material. Lungs/Pleura: New small right pleural effusion and moderate left pleural effusion noted with overlying areas of compressive type atelectasis. Interval development of extensive bilateral upper lobe airspace opacities, right greater than left. Imaging findings are concerning for multifocal pneumonia. Upper Abdomen: No acute findings identified within the imaged portions of the upper abdomen. Previous cholecystectomy with chronic dilatation of the CBD. Musculoskeletal: Degenerative disc disease identified within the lower thoracic and visualized portions of the upper lumbar spine. IMPRESSION: 1. Interval development of extensive bilateral upper lobe airspace opacities, right greater than left. Findings are concerning for multifocal pneumonia.  2. New small right pleural effusion and moderate left pleural effusion with overlying areas of  compressive type atelectasis. 3. Aortic atherosclerosis, in addition to RCA and LAD coronary artery disease. Please note that although the presence of coronary artery calcium documents the presence of coronary artery disease, the severity of this disease and any potential stenosis cannot be assessed on this non-gated CT examination. Assessment for potential risk factor modification, dietary therapy or pharmacologic therapy may be warranted, if clinically indicated. Aortic Atherosclerosis (ICD10-I70.0). Electronically Signed   By: Kerby Moors M.D.   On: 02/06/2020 12:21   DG CHEST PORT 1 VIEW  Result Date: 02/13/2020 CLINICAL DATA:  Respiratory failure. EXAM: PORTABLE CHEST 1 VIEW COMPARISON:  Chest x-ray 02/11/2020.  Chest CT 02/06/2020. FINDINGS: Mediastinum hilar structures normal. Stable cardiomegaly. No pulmonary venous congestion. Multifocal bilateral pulmonary infiltrates again noted. Small bilateral pleural effusions again noted. Similar findings noted on prior exam. No pneumothorax. IMPRESSION: 1. Multifocal bilateral pulmonary infiltrates and small bilateral pleural effusions again noted. Similar findings noted on prior exam. 2.  Stable cardiomegaly. Electronically Signed   By: Marcello Moores  Register   On: 02/13/2020 05:46   DG CHEST PORT 1 VIEW  Result Date: 02/11/2020 CLINICAL DATA:  Dyspnea, right breast cancer, dementia EXAM: PORTABLE CHEST 1 VIEW COMPARISON:  02/05/2020 chest radiograph. FINDINGS: Stable cardiomediastinal silhouette with mild cardiomegaly. No pneumothorax. Small bilateral pleural effusions, decreased on the right and stable on the left. Patchy opacities throughout the upper lungs bilaterally, not substantially changed. Surgical clips overlie the right axilla. IMPRESSION: 1. Patchy opacities throughout the upper lungs bilaterally, not substantially changed, compatible with multilobar pneumonia. 2. Small bilateral pleural effusions, decreased on the right and stable on the left.  Electronically Signed   By: Ilona Sorrel M.D.   On: 02/11/2020 05:10   DG Chest Port 1 View  Result Date: 02/05/2020 CLINICAL DATA:  Low O2 sats, altered mental status, hyperglycemia EXAM: PORTABLE CHEST 1 VIEW COMPARISON:  12/16/2019 FINDINGS: Patchy bilateral airspace opacities have progressed since prior study concerning for multifocal pneumonia. Mild cardiomegaly. Small right pleural effusion. No pneumothorax or acute bony abnormality. IMPRESSION: Worsening patchy bilateral airspace disease compatible with multifocal pneumonia. Small right effusion. Electronically Signed   By: Rolm Baptise M.D.   On: 02/05/2020 11:11   DG Swallowing Func-Speech Pathology  Result Date: 02/13/2020 Objective Swallowing Evaluation: Type of Study: MBS-Modified Barium Swallow Study  Patient Details Name: KYARAH DIERCKS MRN: [id number] Date of Birth: 1941/06/22 Today's Date: 02/13/2020 Time: SLP Start Time (ACUTE ONLY): 1330 -SLP Stop Time (ACUTE ONLY): [id number] SLP Time Calculation (min) (ACUTE ONLY): 35 min Past Medical History: Past Medical History: Diagnosis Date . Abnormal uterine and vaginal bleeding, unspecified  . Allergic rhinitis due to pollen  . Anxiety  . Asthma  . Cancer [facility])   breast cancer - right . Dementia ([place])  . Depression  . Depression  . Diabetes mellitus without complication ([place])  . Essential (primary) hypertension  . Hyperlipidemia, unspecified  . Hypertension  . Major depressive disorder, single episode, unspecified  . Obesity, unspecified  . Pain in joint involving shoulder region  . Reflux esophagitis  . Trigger finger, acquired  . Type 2 diabetes mellitus with hyperglycemia ([place])  . Unspecified asthma with (acute) exacerbation  . Unspecified asthma, uncomplicated  . 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 ([place])  . Unspecified osteoarthritis, unspecified site  Past Surgical History: Past Surgical History: Procedure Laterality Date . CHOLECYSTECTOMY   . MASTECTOMY Right  . RE-EXCISION OF BREAST  LUMPECTOMY   HPI: 79 year old female with a history of dementia, diabetes mellitus type 2, hypertension, hyperlipidemia, asthma, right-sided breast cancer presenting with shortness of breath that was noticed by her family friends during a home visit.  Unfortunately, the patient is unable to provide any history at this time secondary to her dementia and encephalopathy.  In addition, there was concern for the patient's increased somnolence and drowsiness.  As result, the patient was brought to the emergency department for further evaluation.  In the ED, the patient remained somewhat drowsy with oxygen saturation in the 60% range.  The patient was placed on 5 L nasal cannula with oxygen saturation up to 98%.  Chest x-ray showed bilateral patchy infiltrates and increased interstitial markings.  WBC was 8.5.  BMP showed a potassium 3.0 with serum creatinine 0.70.  Notably, the patient was recently mated to the hospital from 12/16/2019 to 12/22/2019 secondary to respiratory failure from asthma exacerbation.  She also had atrial fibrillation with RVR at that time and was started on amiodarone and apixaban.  The patient was discharged to skilled nursing facility, [facility], where she stayed until up to 2 weeks ago when she returned home. Pt seen during this recent hospitalization by ST and was briefly on NTL but was d/c on a D3/thin diet with recommendation for meds whole with puree.  Subjective: "I think I'm awake." Assessment / Plan / Recommendation CHL IP CLINICAL IMPRESSIONS 02/13/2020 Clinical Impression Pt presents with mild pharyngeal phase dysphagia negatively impacted by lethargy and cognitive deficits characterized by reduced attention to task with reduced labial closure and bolus manipulation with liquids (pt unable to safely take thins from the cup and pill was expectorated after presentation whole in puree and NTL), premature spillage of liquids to the level of the pyriforms resulting in penetration/aspiration of thins  during and after the swallow from residuals on subsequent swallows in trace amount to which Pt was variably sensate to. Pt with seemingly adequate hyolaryngeal excursion and tongue base retraction with no residuals with puree and mech soft textures. Pt with trace under epiglottic coating of thins which eventually moved to vocal folds and were aspirated in trace amounts. Pt was unable to follow directions for strategies this date due to lethargy. NTL were penetrated in trace amounts and no observable aspiration observed (it appeared that she aspirated thin residuals during NTL presentations). Recommend D3/mech soft and NTL via cup/straw/spoon with aspiration and reflux precautions with trials of thins with SLP in SNF when Pt is consistently alert.  SLP Visit Diagnosis Dysphagia, oropharyngeal phase (R13.12) Attention and concentration deficit following -- Frontal lobe and executive function deficit following -- Impact on safety and function Moderate aspiration risk   CHL IP TREATMENT RECOMMENDATION 02/13/2020 Treatment Recommendations Therapy as outlined in treatment plan below   Prognosis 02/13/2020 Prognosis for Safe Diet Advancement Fair Barriers to Reach Goals Cognitive deficits Barriers/Prognosis Comment -- CHL IP DIET RECOMMENDATION 02/13/2020 SLP Diet Recommendations Dysphagia 3 (Mech soft) solids;Nectar thick liquid Liquid Administration via Cup;Straw;Spoon Medication Administration Crushed with puree Compensations Minimize environmental distractions;Slow rate;Follow solids with liquid Postural Changes Remain semi-upright after after feeds/meals (Comment);Seated upright at 90 degrees   CHL IP OTHER RECOMMENDATIONS 02/13/2020 Recommended Consults -- Oral Care Recommendations Oral care BID;Staff/trained caregiver to provide oral care Other Recommendations Order thickener from pharmacy;Remove water pitcher;Clarify dietary restrictions   CHL IP FOLLOW UP RECOMMENDATIONS 02/13/2020 Follow up Recommendations Skilled Nursing  facility   [facility] IP FREQUENCY AND DURATION 02/13/2020 Speech Therapy Frequency (ACUTE ONLY) min 2x/week  Treatment Duration 1 week      CHL IP ORAL PHASE 02/13/2020 Oral Phase Impaired Oral - Pudding Teaspoon -- Oral - Pudding Cup -- Oral - Honey Teaspoon -- Oral - Honey Cup -- Oral - Nectar Teaspoon -- Oral - Nectar Cup -- Oral - Nectar Straw -- Oral - Thin Teaspoon -- Oral - Thin Cup Left anterior bolus loss;Right anterior bolus loss;Weak lingual manipulation Oral - Thin Straw -- Oral - Puree -- Oral - Mech Soft -- Oral - Regular -- Oral - Multi-Consistency -- Oral - Pill Reduced posterior propulsion Oral Phase - Comment Pt drousy today and had difficulty manipulating bolus due to same  CHL IP PHARYNGEAL PHASE 02/13/2020 Pharyngeal Phase Impaired Pharyngeal- Pudding Teaspoon -- Pharyngeal -- Pharyngeal- Pudding Cup -- Pharyngeal -- Pharyngeal- Honey Teaspoon -- Pharyngeal -- Pharyngeal- Honey Cup -- Pharyngeal -- Pharyngeal- Nectar Teaspoon Delayed swallow initiation-pyriform sinuses Pharyngeal -- Pharyngeal- Nectar Cup Delayed swallow initiation-pyriform sinuses;Pharyngeal residue - valleculae Pharyngeal -- Pharyngeal- Nectar Straw -- Pharyngeal -- Pharyngeal- Thin Teaspoon Delayed swallow initiation-pyriform sinuses;Penetration/Aspiration during swallow;Penetration/Apiration after swallow;Trace aspiration;Pharyngeal residue - valleculae Pharyngeal Material enters airway, passes BELOW cords without attempt by patient to eject out (silent aspiration);Material enters airway, passes BELOW cords and not ejected out despite cough attempt by patient;Material enters airway, passes BELOW cords then ejected out Pharyngeal- Thin Cup NT Pharyngeal -- Pharyngeal- Thin Straw Delayed swallow initiation-pyriform sinuses;Penetration/Aspiration before swallow;Penetration/Aspiration during swallow;Trace aspiration;Pharyngeal residue - valleculae Pharyngeal -- Pharyngeal- Puree Delayed swallow initiation-vallecula Pharyngeal -- Pharyngeal-  Mechanical Soft Delayed swallow initiation-vallecula Pharyngeal -- Pharyngeal- Regular -- Pharyngeal -- Pharyngeal- Multi-consistency -- Pharyngeal -- Pharyngeal- Pill NT Pharyngeal -- Pharyngeal Comment Suspect lethargy and cognitive impairment impacted pharyngeal swallow  CHL IP CERVICAL ESOPHAGEAL PHASE 02/13/2020 Cervical Esophageal Phase WFL Pudding Teaspoon -- Pudding Cup -- Honey Teaspoon -- Honey Cup -- Nectar Teaspoon -- Nectar Cup -- Nectar Straw -- Thin Teaspoon -- Thin Cup -- Thin Straw -- Puree -- Mechanical Soft -- Regular -- Multi-consistency -- Pill -- Cervical Esophageal Comment -- Thank you, Genene Churn, CCC-SLP [phone] [place] 02/13/2020, 6:30 PM               Micro Results   Recent Results (from the past 240 hour(s))  SARS Coronavirus 2 by RT PCR (hospital order, performed in [place] [facility] hospital lab) Nasopharyngeal Nasopharyngeal Swab     Status: None   Collection Time: 02/05/20 12:13 PM   Specimen: Nasopharyngeal Swab  Result Value Ref Range Status   SARS Coronavirus 2 NEGATIVE NEGATIVE Final    Comment: (NOTE) SARS-CoV-2 target nucleic acids are NOT DETECTED. The SARS-CoV-2 RNA is generally detectable in upper and lower respiratory specimens during the acute phase of infection. The lowest concentration of SARS-CoV-2 viral copies this assay can detect is 250 copies / mL. A negative result does not preclude SARS-CoV-2 infection and should not be used as the sole basis for treatment or other patient management decisions.  A negative result may occur with improper specimen collection / handling, submission of specimen other than nasopharyngeal swab, presence of viral mutation(s) within the areas targeted by this assay, and inadequate number of viral copies (<250 copies / mL). A negative result must be combined with clinical observations, patient history, and epidemiological information. Fact Sheet for Patients:   StrictlyIdeas.no Fact  Sheet for Healthcare Providers: BankingDealers.co.za This test is not yet approved or cleared  by the [place] FDA and has been authorized for detection and/or diagnosis of SARS-CoV-2 by FDA under an Emergency Use Authorization (  EUA).  This EUA will remain in effect (meaning this test can be used) for the duration of the COVID-19 declaration under Section 564(b)(1) of the Act, 21 U.S.C. section 360bbb-3(b)(1), unless the authorization is terminated or revoked sooner. Performed at [facility], [address] [place], [place] [address]   Blood Culture (routine x 2)     Status: None   Collection Time: 02/05/20 12:24 PM   Specimen: BLOOD  Result Value Ref Range Status   Specimen Description BLOOD RIGHT ANTECUBITAL  Final   Special Requests   Final    BOTTLES DRAWN AEROBIC AND ANAEROBIC Blood Culture adequate volume   Culture   Final    NO GROWTH 5 DAYS Performed at [facility], [address], [place], [place] [address]    Report Status 02/10/2020 FINAL  Final  Blood Culture (routine x 2)     Status: None   Collection Time: 02/05/20 12:34 PM   Specimen: BLOOD  Result Value Ref Range Status   Specimen Description BLOOD LEFT ANTECUBITAL  Final   Special Requests   Final    BOTTLES DRAWN AEROBIC AND ANAEROBIC Blood Culture adequate volume   Culture   Final    NO GROWTH 5 DAYS Performed at [facility], [address], [place], [place] [address]    Report Status 02/10/2020 FINAL  Final  MRSA PCR Screening     Status: None   Collection Time: 02/06/20 11:14 AM   Specimen: Nasal Mucosa; Nasopharyngeal  Result Value Ref Range Status   MRSA by PCR NEGATIVE NEGATIVE Final    Comment:        The GeneXpert MRSA Assay (FDA approved for NASAL specimens only), is one component of a comprehensive MRSA colonization surveillance program. It is not intended to diagnose MRSA infection nor to guide or monitor treatment for MRSA infections. Performed at [facility], [address] [place], [place] [address]   SARS Coronavirus 2 by RT PCR (hospital order, performed in [facility] hospital lab) Nasopharyngeal Nasopharyngeal Swab     Status: None   Collection Time: 02/13/20  7:00 PM   Specimen: Nasopharyngeal Swab  Result Value Ref Range Status   SARS Coronavirus 2 NEGATIVE NEGATIVE Final    Comment: (NOTE) SARS-CoV-2 target nucleic acids are NOT DETECTED. The SARS-CoV-2 RNA is generally detectable in upper and lower respiratory specimens during the acute phase of infection. The lowest concentration of SARS-CoV-2 viral copies this assay can detect is 250 copies / mL. A negative result does not preclude SARS-CoV-2 infection and should not be used as the sole basis for treatment or other patient management decisions.  A negative result may occur with improper specimen collection / handling, submission of specimen other than nasopharyngeal swab, presence of viral mutation(s) within the areas targeted by this assay, and inadequate number of viral copies (<250 copies / mL). A negative result must be combined with clinical observations, patient history, and epidemiological information. Fact Sheet for Patients:   StrictlyIdeas.no Fact Sheet for Healthcare Providers: BankingDealers.co.za This test is not yet approved or cleared  by the [place] FDA and has been authorized for detection and/or diagnosis of SARS-CoV-2 by FDA under an Emergency Use Authorization (EUA).  This EUA will remain in effect (meaning this test can be used) for the duration of the COVID-19 declaration under Section 564(b)(1) of the Act, 21 U.S.C. section 360bbb-3(b)(1), unless the authorization is terminated or revoked sooner. Performed at [facility], [address], [place], [place] [address]  Today   Subjective    Dare Tomerlin today has no new complaints -Daughter/POA at bedside, questions answered  --Patient  was initially sleepy this a.m., becoming more awake as the day went on--- drinking okay   Patient has been seen and examined prior to discharge   Objective   Blood pressure 122/64, pulse 65, temperature 98.9 F (37.2 C), temperature source Oral, resp. rate 16, height 5\' 5"  (1.651 m), weight 68 kg, SpO2 95 %.   Intake/Output Summary (Last 24 hours) at 02/14/2020 1513 Last data filed at 02/14/2020 1500 Gross per 24 hour  Intake 783.96 ml  Output 300 ml  Net 483.96 ml   Exam Gen:- Awake Alert, no acute distress, chronically ill-appearing HEENT:- [place].AT, No sclera icterus Nose- [place] 2 L/min Neck-Supple Neck,No JVD,.  Lungs-diminished breath sounds with few scattered rhonchi bilaterally CV- S1, S2 normal, regular Abd-  +ve B.Sounds, Abd Soft, No tenderness,    Extremity/Skin:- No  edema,   good pulses Psych-affect is flat, baseline cognitive and memory deficits  -Neuro-generalized weakness, no new focal deficits, no tremors    Data Review   CBC w Diff:  Lab Results  Component Value Date   WBC 5.8 02/14/2020   HGB 9.0 (L) 02/14/2020   HCT 29.7 (L) 02/14/2020   PLT 245 02/14/2020   LYMPHOPCT 7 12/16/2019   MONOPCT 8 12/16/2019   EOSPCT 0 12/16/2019   BASOPCT 0 12/16/2019    CMP:  Lab Results  Component Value Date   NA 142 02/14/2020   NA 141 06/08/2019   K 3.9 02/14/2020   CL 98 02/14/2020   CO2 36 (H) 02/14/2020   BUN 27 (H) 02/14/2020   BUN 11 06/08/2019   CREATININE 0.80 02/14/2020   GLU 204 06/08/2019   PROT 5.0 (L) 02/12/2020   ALBUMIN 2.5 (L) 02/12/2020   BILITOT 0.6 02/12/2020   ALKPHOS 55 02/12/2020   AST 11 (L) 02/12/2020   ALT 12 02/12/2020  .   Total Discharge time is about 33 minutes  Roxan Hockey M.D on 02/14/2020 at 3:13 PM  Go to [email or web address] -  for contact info  [facility] - Office  [phone]

## 2020-02-15 ENCOUNTER — Other Ambulatory Visit: Payer: Self-pay

## 2020-02-15 ENCOUNTER — Non-Acute Institutional Stay (SKILLED_NURSING_FACILITY): Payer: Medicare Other | Admitting: Adult Health

## 2020-02-15 ENCOUNTER — Encounter: Payer: Self-pay | Admitting: Adult Health

## 2020-02-15 DIAGNOSIS — C50911 Malignant neoplasm of unspecified site of right female breast: Secondary | ICD-10-CM

## 2020-02-15 DIAGNOSIS — E119 Type 2 diabetes mellitus without complications: Secondary | ICD-10-CM

## 2020-02-15 DIAGNOSIS — J9601 Acute respiratory failure with hypoxia: Secondary | ICD-10-CM | POA: Diagnosis not present

## 2020-02-15 DIAGNOSIS — I5032 Chronic diastolic (congestive) heart failure: Secondary | ICD-10-CM

## 2020-02-15 DIAGNOSIS — R1312 Dysphagia, oropharyngeal phase: Secondary | ICD-10-CM

## 2020-02-15 DIAGNOSIS — F039 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: Secondary | ICD-10-CM

## 2020-02-15 DIAGNOSIS — E1169 Type 2 diabetes mellitus with other specified complication: Secondary | ICD-10-CM | POA: Diagnosis not present

## 2020-02-15 DIAGNOSIS — E785 Hyperlipidemia, unspecified: Secondary | ICD-10-CM

## 2020-02-15 DIAGNOSIS — I4811 Longstanding persistent atrial fibrillation: Secondary | ICD-10-CM

## 2020-02-15 DIAGNOSIS — J9602 Acute respiratory failure with hypercapnia: Secondary | ICD-10-CM | POA: Diagnosis not present

## 2020-02-15 DIAGNOSIS — I110 Hypertensive heart disease with heart failure: Secondary | ICD-10-CM

## 2020-02-15 DIAGNOSIS — I5031 Acute diastolic (congestive) heart failure: Secondary | ICD-10-CM

## 2020-02-15 NOTE — Progress Notes (Signed)
Location:    [facility] Room Number: 149/W Place of Service:  SNF (31)   CODE STATUS: DNR  Allergies  Allergen Reactions  . Codeine   . Zantac [Ranitidine Hcl]     Chief Complaint  Patient presents with  . Hospitalization Follow-up    Hospitalization Follow Up    HPI:  She is a 79 year old woman who has been hospitalized from 02-05-20 through 02-14-20. She was treated for acute respiratory failure with hypoxia and hypercupremia; hcap; acute diastolic chf; acute encephalopathy. She is here for short term rehab. She lives with her husband; she had been able to dress herself and toilet herself prior to her hospitalizations. She does have help with care. Her pcp has retired. Her goal at this time is to return back home. There are no reports of uncontrolled pain; no changes in her appetite. No reports of agitation or anxiety. She will continue to be followed for her chronic illnesses including: chf; afib; dementia.   Past Medical History:  Diagnosis Date  . Abnormal uterine and vaginal bleeding, unspecified   . Allergic rhinitis due to pollen   . Anxiety   . Asthma   . Cancer [facility])    breast cancer - right  . Dementia ([place])   . Depression   . Depression   . Diabetes mellitus without complication ([place])   . Essential (primary) hypertension   . Hyperlipidemia, unspecified   . Hypertension   . Major depressive disorder, single episode, unspecified   . Obesity, unspecified   . Pain in joint involving shoulder region   . Reflux esophagitis   . Trigger finger, acquired   . Type 2 diabetes mellitus with hyperglycemia ([place])   . Unspecified asthma with (acute) exacerbation   . Unspecified asthma, uncomplicated   . 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 ([place])   . Unspecified osteoarthritis, unspecified site     Past Surgical History:  Procedure Laterality Date  . CHOLECYSTECTOMY    . MASTECTOMY Right   . RE-EXCISION OF BREAST LUMPECTOMY       Social History   Socioeconomic History  . Marital status: Married    Spouse name: Not on file  . Number of children: Not on file  . Years of education: Not on file  . Highest education level: Not on file  Occupational History  . Not on file  Tobacco Use  . Smoking status: Never Smoker  . Smokeless tobacco: Never Used  Substance and Sexual Activity  . Alcohol use: No  . Drug use: No  . Sexual activity: Not on file  Other Topics Concern  . Not on file  Social History Narrative  . Not on file   Social Determinants of Health   Financial Resource Strain:   . Difficulty of Paying Living Expenses:   Food Insecurity:   . Worried About Charity fundraiser in the Last Year:   . Arboriculturist in the Last Year:   Transportation Needs:   . Film/video editor (Medical):   [place] Kitchen Lack of Transportation (Non-Medical):   Physical Activity:   . Days of Exercise per Week:   . Minutes of Exercise per Session:   Stress:   . Feeling of Stress :   Social Connections:   . Frequency of Communication with Friends and Family:   . Frequency of Social Gatherings with Friends and Family:   . Attends Religious Services:   . Active Member of Clubs or Organizations:   .  Attends Archivist Meetings:   Marland Kitchen Marital Status:   Intimate Partner Violence:   . Fear of Current or Ex-Partner:   . Emotionally Abused:   [place] Kitchen Physically Abused:   . Sexually Abused:    Family History  Family history unknown: Yes      VITAL SIGNS BP 105/62   Pulse 80   Temp 97.8 F (36.6 C) (Oral)   Resp 20   Ht 5\' 5"  (1.651 m)   Wt 132 lb (59.9 kg)   SpO2 95%   BMI 21.97 kg/m   Outpatient Encounter Medications as of 02/15/2020  Medication Sig  . acetaminophen (TYLENOL) 325 MG tablet Take 2 tablets (650 mg total) by mouth every 6 (six) hours as needed for mild pain (or Fever >/= 101).  [place] Kitchen amiodarone (PACERONE) 200 MG tablet Take 400mg  twice daily for 5 days, then 200mg  twice daily for 14 days, then  200mg  daily thereafter  . amLODipine (NORVASC) 5 MG tablet Take 5 mg by mouth daily.  Marland Kitchen anastrozole (ARIMIDEX) 1 MG tablet Take 1 mg by mouth daily.  Marland Kitchen apixaban (ELIQUIS) 5 MG TABS tablet Take 5 mg by mouth 2 (two) times daily.  Marland Kitchen donepezil (ARICEPT) 5 MG tablet Take 5 mg by mouth at bedtime.  . insulin lispro (HUMALOG KWIKPEN) 100 UNIT/ML KwikPen Inject 5 Units into the skin 3 (three) times daily with meals.  . metoprolol tartrate (LOPRESSOR) 25 MG tablet Take 0.5 tablets (12.5 mg total) by mouth 2 (two) times daily.  . Multiple Vitamin (MULTIVITAMIN ADULT PO) Take 1 tablet by mouth daily.  . NON FORMULARY Dysphagia 3 diet with nectar thick liquids.  . OXYGEN Inhale 2 L into the lungs continuous.  . polyethylene glycol (MIRALAX / GLYCOLAX) 17 g packet Take 17 g by mouth daily as needed for mild constipation.  . risperiDONE (RISPERDAL) 0.5 MG tablet Take 1 tablet (0.5 mg total) by mouth at bedtime.  . simvastatin (ZOCOR) 40 MG tablet Take 20 mg by mouth every evening.    No facility-administered encounter medications on file as of 02/15/2020.     SIGNIFICANT DIAGNOSTIC EXAMS  TODAY  02-05-20: chest x-ray: Worsening patchy bilateral airspace disease compatible with multifocal pneumonia. Small right effusion.  02-06-20: ct of head:  1. Interval development of extensive bilateral upper lobe airspace opacities, right greater than left. Findings are concerning for multifocal pneumonia. 2. New small right pleural effusion and moderate left pleural effusion with overlying areas of compressive type atelectasis. 3. Aortic atherosclerosis, in addition to RCA and LAD coronary artery disease. Please note that although the presence of coronary artery calcium documents the presence of coronary artery disease, the severity of this disease and any potential stenosis cannot be assessed on this non-gated CT examination. Assessment for potential risk factor modification, dietary therapy or pharmacologic  therapy may be warranted, if clinically indicated. Aortic Atherosclerosis   02-13-20: chest x-ray:  1. Multifocal bilateral pulmonary infiltrates and small bilateral pleural effusions again noted. Similar findings noted on prior exam. 2.  Stable cardiomegaly.  02-13-20: swallow study: Recommend D3/mech soft and NTL   LABS REVIEWED  02-05-20: wbc 8.5; hgb 9.8; hct 32.4; mcv 94.7plt 344 glucose 285; bun 20; creat 0.70; k+ 3.0; na++ 143; ca 8.3 mag 2.2 blood culture: no growth 02-06-20: vit B 12: 725; folate 6.2; ammonia 38; tsh 1.503; free t4: 1.24 02-08-20: wbc 7.6; hgb 10.1; hct 34.4; mcv 98.6 plt 299; glucose 113; bun 12; creat 0.64; k+ 3.2; na++ 146; ca 7.9  02-10-20: glucose 118; bun 19; creat 0.70; k+ 3.1; na++ 143; ca 7.9 liver normal albumin 2.1 02-14-20: wb 5.8; hgb 9.0; hct 29.7; mcv 94.9 plt 245; glucose 125; bun 27; creat 0.80; k+ 3.9; na++ 142; ca 8.5    Review of Systems  Unable to perform ROS: Dementia (unable to participate )    Physical Exam Constitutional:      General: She is not in acute distress.    Appearance: She is well-developed. She is not diaphoretic.  Neck:     Thyroid: No thyromegaly.  Cardiovascular:     Rate and Rhythm: Normal rate and regular rhythm.     Pulses: Normal pulses.     Heart sounds: Normal heart sounds.  Pulmonary:     Effort: Pulmonary effort is normal. No respiratory distress.     Breath sounds: Normal breath sounds.  Abdominal:     General: Bowel sounds are normal. There is no distension.     Palpations: Abdomen is soft.     Tenderness: There is no abdominal tenderness.  Musculoskeletal:        General: Normal range of motion.     Cervical back: Neck supple.     Right lower leg: No edema.     Left lower leg: No edema.  Lymphadenopathy:     Cervical: No cervical adenopathy.  Skin:    General: Skin is warm and dry.  Neurological:     Mental Status: She is alert. Mental status is at baseline.  Psychiatric:        Mood and Affect:  Mood normal.        ASSESSMENT/ PLAN:  TODAY  1. Acute diastolic CHF (congestive heart failure) is stable EF 50-55% (12-17-19) is compensated will continue lopressor 12.5 mg twice daily   2. Longstanding persistent atrial fibrillation: heart rate is stable will continue amiodarone 400 mg twice daily through 02-20-20 then 200 mg twice daily through 03-04-20 then 200 mg daily; lopressor 12.5 mg twice daily for rate control and eliquis 5 mg twice daily   3. Hypertensive heart disease with chronic diastolic congestive heart failure: is stable b/p 105/62 will continue norvasc 5 mg daily lopressor 12.5 mg twice daily   4. Acute respiratory failure with hypoxia and hypercapnia is stable is on 02 at 2liters; will monitor   5. Oropharyngeal dysphagia: is stable no signs of aspiration is on nectar thick liquids.   6.  Type 2 diabetes mellitus without complication without long term current use of insulin: is stable hgb a1c 6.8 will stop amaryl due to high risk of hypoglycemia will continue humalog 5 units with meals for cbg >200  7. Dementia without behavioral disturbance unspecified dementia type: is stable weight is 132 pounds; will continue aricept 5 mg daily   8. 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 Hyperlipidemia associated with type 2 diabetes mellitus: is stable will continue zocor 20 mg daily   10. 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 Chronic constipation: is stable will continue miralax daily as needed     MD is aware of resident's narcotic use and is in agreement with current plan of care. We will attempt to wean resident as appropriate.  Ok Edwards NP [facility]  Contact [phone] Monday through Friday 8am- 5pm  After  hours  call [phone]

## 2020-02-16 ENCOUNTER — Encounter: Payer: Self-pay | Admitting: Internal Medicine

## 2020-02-16 ENCOUNTER — Non-Acute Institutional Stay (SKILLED_NURSING_FACILITY): Payer: Medicare Other | Admitting: Internal Medicine

## 2020-02-16 DIAGNOSIS — I110 Hypertensive heart disease with heart failure: Secondary | ICD-10-CM | POA: Insufficient documentation

## 2020-02-16 DIAGNOSIS — F0391 Unspecified dementia with behavioral disturbance: Secondary | ICD-10-CM | POA: Diagnosis not present

## 2020-02-16 DIAGNOSIS — E119 Type 2 diabetes mellitus without complications: Secondary | ICD-10-CM | POA: Insufficient documentation

## 2020-02-16 DIAGNOSIS — J189 Pneumonia, unspecified organism: Secondary | ICD-10-CM | POA: Diagnosis not present

## 2020-02-16 DIAGNOSIS — F039 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: Secondary | ICD-10-CM | POA: Insufficient documentation

## 2020-02-16 DIAGNOSIS — I4811 Longstanding persistent atrial fibrillation: Secondary | ICD-10-CM | POA: Diagnosis not present

## 2020-02-16 DIAGNOSIS — I5031 Acute diastolic (congestive) heart failure: Secondary | ICD-10-CM

## 2020-02-16 DIAGNOSIS — E1169 Type 2 diabetes mellitus with other specified complication: Secondary | ICD-10-CM | POA: Insufficient documentation

## 2020-02-16 NOTE — Assessment & Plan Note (Addendum)
A1c was 6.8%; at her age with her advanced multiple comorbidities; A1 goal is 8% or less as long as no hypoglycemia.  Intensive sliding scale insulin and Amaryl have been discontinued appropriately.

## 2020-02-16 NOTE — Patient Instructions (Signed)
See assessment and plan under each diagnosis in the problem list and acutely for this visit 

## 2020-02-16 NOTE — Assessment & Plan Note (Addendum)
Dysphagia 3 diet with nectar thick liquids as aspiration with thin liquids had been questioned by Speech Therapy.

## 2020-02-16 NOTE — Progress Notes (Signed)
NURSING HOME LOCATION:  [place] NUMBER:   149-W  CODE STATUS:  DNR  PCP:  Hendricks Limes, MD  [address] [place] [address]   This is a comprehensive admission note to Amy Hoffman performed on this date less than 30 days from date of admission. Included are preadmission medical/surgical history; reconciled medication list; family history; social history and comprehensive review of systems.  Corrections and additions to the records were documented. Comprehensive physical exam was also performed. Additionally a clinical summary was entered for each active diagnosis pertinent to this admission in the Problem List to enhance continuity of care.  HPI: Patient was hospitalized 5/24-02/14/2020 with acute respiratory failure and hypoxia.  She has baseline dementia but was drowsy at presentation.  Family and friends had noted the mental status changes and obvious shortness of breath when they visited her.  In the ED O2 sats were in the low 60% range on room air.  Chest x-ray revealed bilateral infiltrates.   Clinically she also had acute diastolic congestive heart failure for which she received IV Lasix.  She also had an episode of transient A. fib with RVR.  Lopressor and amiodarone were continued for rate control and apixaban for stroke prophylaxis. She improved with cefepime antibiotic therapy & was on 2 L of nasal oxygen at discharge to maintain O2 sats above 90%. She was noted to be excessively somnolent with benzodiazepine and Risperdal. Speech therapy consulted and recommended dysphagia 3 diet with nectar thick liquids as she appeared to be aspirating thin liquids. Admission potassium was 3.0. Despite an A1c of 6.8%; she was discharged to the SNF on Amaryl and sliding scale insulin.  Past medical and surgical history: Includes history of asthma, diabetes with vascular complication, reflux esophagitis, history of depression, essential hypertension,  dyslipidemia, history of breast cancer and dementia. Surgeries include cholecystectomy and mastectomy.  Social history: Nondrinker; never smoked.  Family history: Noncontributory due to age.   Review of systems:  Could not be completed due to dementia.  She was able to give me her birthdate but could not give me today's date, not even the year.  She denied any active symptoms to complete review of systems, stating "not at this time".    Physical exam:  Pertinent or positive findings: She has somewhat of a "grimacing" facies.  Heart rate was slow.  Chest was surprisingly clear except for minor rales at the bases.  The upper extremities were weaker than the lower extremities.  Pedal pulses were decreased.  She has 1+ edema at the sock line.  General appearance:  no acute distress, increased work of breathing is present.   Lymphatic: No lymphadenopathy about the head, neck, axilla. Eyes: No conjunctival inflammation or lid edema is present. There is no scleral icterus. Ears:  External ear exam shows no significant lesions or deformities.   Nose:  External nasal examination shows no deformity or inflammation. Nasal mucosa are pink and moist without lesions, exudates Oral exam: Lips and gums are healthy appearing.There is no oropharyngeal erythema or exudate. Neck:  No thyromegaly, masses, tenderness noted.    Heart:  No gallop, murmur, click, rub.  Lungs:  without wheezes, rhonchi, rubs. Abdomen: Bowel sounds are normal.  Abdomen is soft and nontender with no organomegaly, hernias, masses. GU: Deferred  Extremities:  No cyanosis, clubbing. Neurologic exam:  Balance, [place], finger to nose testing could not be completed due to clinical state Skin: Warm & dry w/o tenting. No  significant lesions or rash.  See clinical summary under each active problem in the Problem List with associated updated therapeutic plan

## 2020-02-16 NOTE — Assessment & Plan Note (Signed)
02/16/2020 patient was unable to provide me with any meaningful history.  She was continually trying to rise from the wheelchair.  This was reported to staff. This patient would require 24/7 supervision or placement in a memory unit for ultimate safety.

## 2020-02-16 NOTE — Assessment & Plan Note (Signed)
03/18/8306 diastolic congestive heart failure is compensated clinically

## 2020-02-16 NOTE — Assessment & Plan Note (Signed)
Transient A. fib with RVR in the hospital.  At this time rate is well controlled.

## 2020-02-20 ENCOUNTER — Other Ambulatory Visit: Payer: Self-pay | Admitting: *Deleted

## 2020-02-20 NOTE — Patient Outreach (Addendum)
Screened for potential [facility] Care Management needs as a benefit of  [employer].  Mrs. Amy Hoffman is currently receiving skilled therapy at [facility].   Writer attended telephonic interdisciplinary team meeting to assess for disposition needs and transition plan for resident.   Member is from home with spouse. Mrs. Amy Hoffman's daughter Amy Hoffman is primary contact and is very involved in member's care planning.   Will plan outreach as approprorpriate to Mrs. Amy Hoffman's daughter to discuss [facility] Care Management services upon SNF.transition. Will also need to verify member's PCP. Will continue to follow while Mrs. Amy Hoffman resides in SNF.    Marthenia Rolling, MSN-Ed, RN,BSN [facility] Acute Care Coordinator [phone] [facility]) [phone]  (Toll free office)

## 2020-02-22 ENCOUNTER — Non-Acute Institutional Stay (SKILLED_NURSING_FACILITY): Payer: Medicare Other | Admitting: Adult Health

## 2020-02-22 ENCOUNTER — Encounter: Payer: Self-pay | Admitting: Adult Health

## 2020-02-22 DIAGNOSIS — Z794 Long term (current) use of insulin: Secondary | ICD-10-CM

## 2020-02-22 DIAGNOSIS — I110 Hypertensive heart disease with heart failure: Secondary | ICD-10-CM

## 2020-02-22 DIAGNOSIS — I5032 Chronic diastolic (congestive) heart failure: Secondary | ICD-10-CM

## 2020-02-22 DIAGNOSIS — F0391 Unspecified dementia with behavioral disturbance: Secondary | ICD-10-CM

## 2020-02-22 DIAGNOSIS — E119 Type 2 diabetes mellitus without complications: Secondary | ICD-10-CM

## 2020-02-22 NOTE — Progress Notes (Signed)
Location:    [place] Room Number: 149/W Place of Service:  SNF (31)   CODE STATUS: DNR  Allergies  Allergen Reactions  . Codeine   . Zantac [Ranitidine Hcl]     Chief Complaint  Patient presents with  . Acute Visit    Care Plan Meeting    HPI:  We have come together for her care plan meeting. Family present. Unable to do BIMS mood 0/30. No reports of falls; her weight is stable. Her po intake is poor.  Her goal is to return home  Her family will need education on insulin. She is able to ambulate short distances. She needs cueing for her adl needs. She remains on nectar thick liquids; is due for swallow study next week.  She will continue to be followed for her chronic illnesses including: Dementia with behavioral disturbance unspecified dementia type  Controlled type 2 diabetes mellitus without complication with current long term use of insulin  Hypertensive heart disease with chronic diastolic congestive heart failure  Past Medical History:  Diagnosis Date  . Abnormal uterine and vaginal bleeding, unspecified   . Allergic rhinitis due to pollen   . Anxiety   . Asthma   . Cancer [facility])    breast cancer - right  . Dementia ([place])   . Depression   . Diabetes mellitus without complication ([place])   . Essential (primary) hypertension   . Hyperlipidemia, unspecified   . Hypertension   . Major depressive disorder, single episode, unspecified   . Obesity, unspecified   . Pain in joint involving shoulder region   . Reflux esophagitis   . Trigger finger, acquired   . Type 2 diabetes mellitus with hyperglycemia ([place])   . Unspecified asthma with (acute) exacerbation   . 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 ([place])   . Unspecified osteoarthritis, unspecified site     Past Surgical History:  Procedure Laterality Date  . CHOLECYSTECTOMY    . MASTECTOMY Right   . RE-EXCISION OF BREAST LUMPECTOMY      Social History   Socioeconomic History    . Marital status: Married    Spouse name: Not on file  . Number of children: Not on file  . Years of education: Not on file  . Highest education level: Not on file  Occupational History  . Not on file  Tobacco Use  . Smoking status: Never Smoker  . Smokeless tobacco: Never Used  Substance and Sexual Activity  . Alcohol use: No  . Drug use: No  . Sexual activity: Not on file  Other Topics Concern  . Not on file  Social History Narrative  . Not on file   Social Determinants of Health   Financial Resource Strain:   . Difficulty of Paying Living Expenses:   Food Insecurity:   . Worried About Charity fundraiser in the Last Year:   . Arboriculturist in the Last Year:   Transportation Needs:   . Film/video editor (Medical):   Marland Kitchen Lack of Transportation (Non-Medical):   Physical Activity:   . Days of Exercise per Week:   . Minutes of Exercise per Session:   Stress:   . Feeling of Stress :   Social Connections:   . Frequency of Communication with Friends and Family:   . Frequency of Social Gatherings with Friends and Family:   . Attends Religious Services:   . Active Member of Clubs or Organizations:   . Attends Club  or Organization Meetings:   Marland Kitchen Marital Status:   Intimate Partner Violence:   . Fear of Current or Ex-Partner:   . Emotionally Abused:   Marland Kitchen Physically Abused:   . Sexually Abused:    Family History  Family history unknown: Yes      VITAL SIGNS BP (!) 120/52   Pulse 62   Temp (!) 97.4 F (36.3 C) (Oral)   Resp 16   Ht 5\' 5"  (1.651 m)   Wt 132 lb 3.2 oz (60 kg)   SpO2 95%   BMI 22.00 kg/m   Outpatient Encounter Medications as of 02/22/2020  Medication Sig  . acetaminophen (TYLENOL) 325 MG tablet Take 2 tablets (650 mg total) by mouth every 6 (six) hours as needed for mild pain (or Fever >/= 101).  Marland Kitchen amiodarone (PACERONE) 200 MG tablet Take 200 mg by mouth 2 (two) times daily.  Derrill Memo ON 03/05/2020] amiodarone (PACERONE) 200 MG tablet Take 200  mg by mouth daily.  Marland Kitchen amLODipine (NORVASC) 5 MG tablet Take 5 mg by mouth daily.  Marland Kitchen anastrozole (ARIMIDEX) 1 MG tablet Take 1 mg by mouth daily.  Marland Kitchen apixaban (ELIQUIS) 5 MG TABS tablet Take 5 mg by mouth 2 (two) times daily.  Marland Kitchen donepezil (ARICEPT) 5 MG tablet Take 5 mg by mouth at bedtime.  . insulin lispro (HUMALOG KWIKPEN) 100 UNIT/ML KwikPen Inject 5 Units into the skin 3 (three) times daily with meals.  . metoprolol tartrate (LOPRESSOR) 25 MG tablet Take 0.5 tablets (12.5 mg total) by mouth 2 (two) times daily.  . Multiple Vitamin (MULTIVITAMIN ADULT PO) Take 1 tablet by mouth daily.  . NON FORMULARY Dysphagia 3 diet with nectar thick liquids.  . polyethylene glycol (MIRALAX / GLYCOLAX) 17 g packet Take 17 g by mouth daily as needed for mild constipation.  . risperiDONE (RISPERDAL) 0.5 MG tablet Take 1 tablet (0.5 mg total) by mouth at bedtime.  . simvastatin (ZOCOR) 40 MG tablet Take 20 mg by mouth every evening.   . [DISCONTINUED] amiodarone (PACERONE) 200 MG tablet Take 400mg  twice daily for 5 days, then 200mg  twice daily for 14 days, then 200mg  daily thereafter  . [DISCONTINUED] OXYGEN Inhale 2 L into the lungs continuous.   No facility-administered encounter medications on file as of 02/22/2020.     SIGNIFICANT DIAGNOSTIC EXAMS  PREVIOUS  02-05-20: chest x-ray: Worsening patchy bilateral airspace disease compatible with multifocal pneumonia. Small right effusion.  02-06-20: ct of head:  1. Interval development of extensive bilateral upper lobe airspace opacities, right greater than left. Findings are concerning for multifocal pneumonia. 2. New small right pleural effusion and moderate left pleural effusion with overlying areas of compressive type atelectasis. 3. Aortic atherosclerosis, in addition to RCA and LAD coronary artery disease. Please note that although the presence of coronary artery calcium documents the presence of coronary artery disease, the severity of this disease  and any potential stenosis cannot be assessed on this non-gated CT examination. Assessment for potential risk factor modification, dietary therapy or pharmacologic therapy may be warranted, if clinically indicated. Aortic Atherosclerosis   02-13-20: chest x-ray:  1. Multifocal bilateral pulmonary infiltrates and small bilateral pleural effusions again noted. Similar findings noted on prior exam. 2.  Stable cardiomegaly.  02-13-20: swallow study: Recommend D3/mech soft and NTL   NO NEW EXAMS.   LABS REVIEWED PREVIOUS  12-16-19: hgb  a1c 6.8  02-05-20: wbc 8.5; hgb 9.8; hct 32.4; mcv 94.7plt 344 glucose 285; bun 20; creat 0.70; k+ 3.0;  na++ 143; ca 8.3 mag 2.2 blood culture: no growth 02-06-20: vit B 12: 725; folate 6.2; ammonia 38; tsh 1.503; free t4: 1.24 02-08-20: wbc 7.6; hgb 10.1; hct 34.4; mcv 98.6 plt 299; glucose 113; bun 12; creat 0.64; k+ 3.2; na++ 146; ca 7.9  02-10-20: glucose 118; bun 19; creat 0.70; k+ 3.1; na++ 143; ca 7.9 liver normal albumin 2.1 02-14-20: wbc 5.8; hgb 9.0; hct 29.7; mcv 94.9 plt 245; glucose 125; bun 27; creat 0.80; k+ 3.9; na++ 142; ca 8.5   NO NEW LABS.    Review of Systems  Unable to perform ROS: Dementia (unable to participate )    Physical Exam Constitutional:      General: She is not in acute distress.    Appearance: She is well-developed. She is not diaphoretic.  Neck:     Thyroid: No thyromegaly.  Cardiovascular:     Rate and Rhythm: Normal rate and regular rhythm.     Pulses: Normal pulses.     Heart sounds: Normal heart sounds.  Pulmonary:     Effort: Pulmonary effort is normal. No respiratory distress.     Breath sounds: Normal breath sounds.  Abdominal:     General: Bowel sounds are normal. There is no distension.     Palpations: Abdomen is soft.     Tenderness: There is no abdominal tenderness.  Musculoskeletal:        General: Normal range of motion.     Cervical back: Neck supple.     Right lower leg: No edema.     Left lower leg:  No edema.  Lymphadenopathy:     Cervical: No cervical adenopathy.  Skin:    General: Skin is warm and dry.  Neurological:     Mental Status: She is alert. Mental status is at baseline.  Psychiatric:        Mood and Affect: Mood normal.        ASSESSMENT/ PLAN:  TODAY  1. 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 Controlled type 2 diabetes mellitus without complication with current long term use of insulin 3. Hypertensive heart disease with chronic diastolic congestive heart failure  Will continue therapy as directed Will continue current medications Will continue current plan of care Will continue to monitor her status.     MD is aware of resident's narcotic use and is in agreement with current plan of care. We will attempt to wean resident as appropriate.  Ok Edwards NP [facility]  Contact [phone] Monday through Friday 8am- 5pm  After hours call [phone]

## 2020-02-23 ENCOUNTER — Encounter: Payer: Self-pay | Admitting: Internal Medicine

## 2020-02-23 ENCOUNTER — Encounter: Payer: Self-pay | Admitting: Adult Health

## 2020-02-23 ENCOUNTER — Non-Acute Institutional Stay (SKILLED_NURSING_FACILITY): Payer: Medicare Other | Admitting: Adult Health

## 2020-02-23 DIAGNOSIS — I5032 Chronic diastolic (congestive) heart failure: Secondary | ICD-10-CM | POA: Diagnosis not present

## 2020-02-23 DIAGNOSIS — I5031 Acute diastolic (congestive) heart failure: Secondary | ICD-10-CM

## 2020-02-23 DIAGNOSIS — I110 Hypertensive heart disease with heart failure: Secondary | ICD-10-CM

## 2020-02-23 DIAGNOSIS — I4811 Longstanding persistent atrial fibrillation: Secondary | ICD-10-CM

## 2020-02-23 NOTE — Progress Notes (Signed)
Location:    [place] Room Number: 149/W Place of Service:  SNF (31)   CODE STATUS: DNR  Allergies  Allergen Reactions  . Codeine   . Zantac [Ranitidine Hcl]     Chief Complaint  Patient presents with  . Short Term Rehab    Routine Visit    HPI:    Past Medical History:  Diagnosis Date  . Abnormal uterine and vaginal bleeding, unspecified   . Allergic rhinitis due to pollen   . Anxiety   . Asthma   . Cancer [facility])    breast cancer - right  . Dementia ([place])   . Depression   . Diabetes mellitus without complication ([place])   . Essential (primary) hypertension   . Hyperlipidemia, unspecified   . Hypertension   . Major depressive disorder, single episode, unspecified   . Obesity, unspecified   . Pain in joint involving shoulder region   . Reflux esophagitis   . Trigger finger, acquired   . Type 2 diabetes mellitus with hyperglycemia ([place])   . Unspecified asthma with (acute) exacerbation   . 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 ([place])   . Unspecified osteoarthritis, unspecified site     Past Surgical History:  Procedure Laterality Date  . CHOLECYSTECTOMY    . MASTECTOMY Right   . RE-EXCISION OF BREAST LUMPECTOMY      Social History   Socioeconomic History  . Marital status: Married    Spouse name: Not on file  . Number of children: Not on file  . Years of education: Not on file  . Highest education level: Not on file  Occupational History  . Not on file  Tobacco Use  . Smoking status: Never Smoker  . Smokeless tobacco: Never Used  Substance and Sexual Activity  . Alcohol use: No  . Drug use: No  . Sexual activity: Not on file  Other Topics Concern  . Not on file  Social History Narrative  . Not on file   Social Determinants of Health   Financial Resource Strain:   . Difficulty of Paying Living Expenses:   Food Insecurity:   . Worried About Charity fundraiser in the Last Year:   . Arboriculturist in the  Last Year:   Transportation Needs:   . Film/video editor (Medical):   Marland Kitchen Lack of Transportation (Non-Medical):   Physical Activity:   . Days of Exercise per Week:   . Minutes of Exercise per Session:   Stress:   . Feeling of Stress :   Social Connections:   . Frequency of Communication with Friends and Family:   . Frequency of Social Gatherings with Friends and Family:   . Attends Religious Services:   . Active Member of Clubs or Organizations:   . Attends Archivist Meetings:   Marland Kitchen Marital Status:   Intimate Partner Violence:   . Fear of Current or Ex-Partner:   . Emotionally Abused:   Marland Kitchen Physically Abused:   . Sexually Abused:    Family History  Family history unknown: Yes      VITAL SIGNS BP 112/61   Pulse 79   Temp (!) 97.4 F (36.3 C) (Oral)   Resp 16   Ht 5\' 5"  (1.651 m)   Wt 132 lb 3.2 oz (60 kg)   SpO2 95%   BMI 22.00 kg/m   Outpatient Encounter Medications as of 02/23/2020  Medication Sig  . acetaminophen (TYLENOL) 325 MG tablet Take  2 tablets (650 mg total) by mouth every 6 (six) hours as needed for mild pain (or Fever >/= 101).  Marland Kitchen amiodarone (PACERONE) 200 MG tablet Take 200 mg by mouth 2 (two) times daily.  Derrill Memo ON 03/05/2020] amiodarone (PACERONE) 200 MG tablet Take 200 mg by mouth daily.  Marland Kitchen amLODipine (NORVASC) 5 MG tablet Take 5 mg by mouth daily.  Marland Kitchen anastrozole (ARIMIDEX) 1 MG tablet Take 1 mg by mouth daily.  Marland Kitchen apixaban (ELIQUIS) 5 MG TABS tablet Take 5 mg by mouth 2 (two) times daily.  Marland Kitchen donepezil (ARICEPT) 5 MG tablet Take 5 mg by mouth at bedtime.  . insulin lispro (HUMALOG KWIKPEN) 100 UNIT/ML KwikPen Inject 5 Units into the skin 3 (three) times daily with meals.  . metoprolol tartrate (LOPRESSOR) 25 MG tablet Take 0.5 tablets (12.5 mg total) by mouth 2 (two) times daily.  . Multiple Vitamin (MULTIVITAMIN ADULT PO) Take 1 tablet by mouth daily.  . NON FORMULARY Dysphagia 3 diet with nectar thick liquids.  . polyethylene glycol  (MIRALAX / GLYCOLAX) 17 g packet Take 17 g by mouth daily as needed for mild constipation.  . risperiDONE (RISPERDAL) 0.5 MG tablet Take 1 tablet (0.5 mg total) by mouth at bedtime.  . simvastatin (ZOCOR) 40 MG tablet Take 20 mg by mouth every evening.    No facility-administered encounter medications on file as of 02/23/2020.     SIGNIFICANT DIAGNOSTIC EXAMS       ASSESSMENT/ PLAN:    MD is aware of resident's narcotic use and is in agreement with current plan of care. We will attempt to wean resident as appropriate.  Ok Edwards NP [facility]  Contact [phone] Monday through Friday 8am- 5pm  After hours call [phone]   This encounter was created in error - please disregard.

## 2020-02-23 NOTE — Progress Notes (Signed)
Location:    [place] Room Number: 149/W Place of Service:  SNF (31)   CODE STATUS: DNR  Allergies  Allergen Reactions  . Codeine   . Zantac [Ranitidine Hcl]     Chief Complaint  Patient presents with  . Short Term Rehab          Acute diastolic CHF (congestive heart failure)    Longstanding persistent atrial fibrillation:  Hypertensive heart disease with chronic diastolic congestive heart failure:   Weekly follow up for the first 30 days post hospitalization.     HPI:  She is a 79 year old short term rehab patient being seen for the management of her chronic illnesses: heart failure; afib; hypertensive heart disease. There are no reports of uncontrolled pain; no cough; no shortness of breath. No reports of constipation.   Past Medical History:  Diagnosis Date  . Abnormal uterine and vaginal bleeding, unspecified   . Allergic rhinitis due to pollen   . Anxiety   . Asthma   . Cancer [facility])    breast cancer - right  . Dementia ([place])   . Depression   . Diabetes mellitus without complication ([place])   . Essential (primary) hypertension   . Hyperlipidemia, unspecified   . Hypertension   . Major depressive disorder, single episode, unspecified   . Obesity, unspecified   . Pain in joint involving shoulder region   . Reflux esophagitis   . Trigger finger, acquired   . Type 2 diabetes mellitus with hyperglycemia ([place])   . Unspecified asthma with (acute) exacerbation   . 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 ([place])   . Unspecified osteoarthritis, unspecified site     Past Surgical History:  Procedure Laterality Date  . CHOLECYSTECTOMY    . MASTECTOMY Right   . RE-EXCISION OF BREAST LUMPECTOMY      Social History   Socioeconomic History  . Marital status: Married    Spouse name: Not on file  . Number of children: Not on file  . Years of education: Not on file  . Highest education level: Not on file  Occupational History  . Not  on file  Tobacco Use  . Smoking status: Never Smoker  . Smokeless tobacco: Never Used  Substance and Sexual Activity  . Alcohol use: No  . Drug use: No  . Sexual activity: Not on file  Other Topics Concern  . Not on file  Social History Narrative  . Not on file   Social Determinants of Health   Financial Resource Strain:   . Difficulty of Paying Living Expenses:   Food Insecurity:   . Worried About Charity fundraiser in the Last Year:   . Arboriculturist in the Last Year:   Transportation Needs:   . Film/video editor (Medical):   Marland Kitchen Lack of Transportation (Non-Medical):   Physical Activity:   . Days of Exercise per Week:   . Minutes of Exercise per Session:   Stress:   . Feeling of Stress :   Social Connections:   . Frequency of Communication with Friends and Family:   . Frequency of Social Gatherings with Friends and Family:   . Attends Religious Services:   . Active Member of Clubs or Organizations:   . Attends Archivist Meetings:   Marland Kitchen Marital Status:   Intimate Partner Violence:   . Fear of Current or Ex-Partner:   . Emotionally Abused:   Marland Kitchen Physically Abused:   .  Sexually Abused:    Family History  Family history unknown: Yes      VITAL SIGNS BP 112/61   Pulse 79   Temp (!) 97.4 F (36.3 C) (Oral)   Resp 16   Ht 5\' 5"  (1.651 m)   Wt 132 lb 3.2 oz (60 kg)   SpO2 95%   BMI 22.00 kg/m   Outpatient Encounter Medications as of 02/23/2020  Medication Sig  . acetaminophen (TYLENOL) 325 MG tablet Take 2 tablets (650 mg total) by mouth every 6 (six) hours as needed for mild pain (or Fever >/= 101).  Marland Kitchen amiodarone (PACERONE) 200 MG tablet Take 200 mg by mouth 2 (two) times daily.  Derrill Memo ON 03/05/2020] amiodarone (PACERONE) 200 MG tablet Take 200 mg by mouth daily.  Marland Kitchen amLODipine (NORVASC) 5 MG tablet Take 5 mg by mouth daily.  Marland Kitchen anastrozole (ARIMIDEX) 1 MG tablet Take 1 mg by mouth daily.  Marland Kitchen apixaban (ELIQUIS) 5 MG TABS tablet Take 5 mg by  mouth 2 (two) times daily.  Marland Kitchen donepezil (ARICEPT) 5 MG tablet Take 5 mg by mouth at bedtime.  . insulin lispro (HUMALOG KWIKPEN) 100 UNIT/ML KwikPen Inject 5 Units into the skin 3 (three) times daily with meals.  . metoprolol tartrate (LOPRESSOR) 25 MG tablet Take 0.5 tablets (12.5 mg total) by mouth 2 (two) times daily.  . Multiple Vitamin (MULTIVITAMIN ADULT PO) Take 1 tablet by mouth daily.  . NON FORMULARY Dysphagia 3 diet with nectar thick liquids.  . polyethylene glycol (MIRALAX / GLYCOLAX) 17 g packet Take 17 g by mouth daily as needed for mild constipation.  . risperiDONE (RISPERDAL) 0.5 MG tablet Take 1 tablet (0.5 mg total) by mouth at bedtime.  . simvastatin (ZOCOR) 40 MG tablet Take 20 mg by mouth every evening.    No facility-administered encounter medications on file as of 02/23/2020.     SIGNIFICANT DIAGNOSTIC EXAMS   PREVIOUS  02-05-20: chest x-ray: Worsening patchy bilateral airspace disease compatible with multifocal pneumonia. Small right effusion.  02-06-20: ct of head:  1. Interval development of extensive bilateral upper lobe airspace opacities, right greater than left. Findings are concerning for multifocal pneumonia. 2. New small right pleural effusion and moderate left pleural effusion with overlying areas of compressive type atelectasis. 3. Aortic atherosclerosis, in addition to RCA and LAD coronary artery disease. Please note that although the presence of coronary artery calcium documents the presence of coronary artery disease, the severity of this disease and any potential stenosis cannot be assessed on this non-gated CT examination. Assessment for potential risk factor modification, dietary therapy or pharmacologic therapy may be warranted, if clinically indicated. Aortic Atherosclerosis   02-13-20: chest x-ray:  1. Multifocal bilateral pulmonary infiltrates and small bilateral pleural effusions again noted. Similar findings noted on prior exam. 2.  Stable  cardiomegaly.  02-13-20: swallow study: Recommend D3/mech soft and NTL   NO NEW EXAMS.   LABS REVIEWED PREVIOUS  12-16-19: hgb  a1c 6.8  02-05-20: wbc 8.5; hgb 9.8; hct 32.4; mcv 94.7plt 344 glucose 285; bun 20; creat 0.70; k+ 3.0; na++ 143; ca 8.3 mag 2.2 blood culture: no growth 02-06-20: vit B 12: 725; folate 6.2; ammonia 38; tsh 1.503; free t4: 1.24 02-08-20: wbc 7.6; hgb 10.1; hct 34.4; mcv 98.6 plt 299; glucose 113; bun 12; creat 0.64; k+ 3.2; na++ 146; ca 7.9  02-10-20: glucose 118; bun 19; creat 0.70; k+ 3.1; na++ 143; ca 7.9 liver normal albumin 2.1 02-14-20: wbc 5.8; hgb 9.0; hct  29.7; mcv 94.9 plt 245; glucose 125; bun 27; creat 0.80; k+ 3.9; na++ 142; ca 8.5   NO NEW LABS.    Review of Systems  Unable to perform ROS: Dementia (unable to participate )   Physical Exam Constitutional:      General: She is not in acute distress.    Appearance: She is well-developed. She is not diaphoretic.  Neck:     Thyroid: No thyromegaly.  Cardiovascular:     Rate and Rhythm: Normal rate and regular rhythm.     Pulses: Normal pulses.     Heart sounds: Normal heart sounds.  Pulmonary:     Effort: Pulmonary effort is normal. No respiratory distress.     Breath sounds: Normal breath sounds.  Abdominal:     General: Bowel sounds are normal. There is no distension.     Palpations: Abdomen is soft.     Tenderness: There is no abdominal tenderness.  Musculoskeletal:        General: Normal range of motion.     Cervical back: Neck supple.     Right lower leg: No edema.     Left lower leg: No edema.  Lymphadenopathy:     Cervical: No cervical adenopathy.  Skin:    General: Skin is warm and dry.  Neurological:     Mental Status: She is alert. Mental status is at baseline.  Psychiatric:        Mood and Affect: Mood normal.      ASSESSMENT/ PLAN:  TODAY  1. Acute diastolic CHF (congestive heart failure) is stable EF 50-55% (12-17-19) is compensated will continue lopressor 12.5 mg twice  daily   2. Longstanding persistent atrial fibrillation: heart rate is stable will continue amiodarone 400 mg twice daily through 02-20-20 then 200 mg twice daily through 03-04-20 then 200 mg daily; lopressor 12.5 mg twice daily for rate control and eliquis 5 mg twice daily   3. Hypertensive heart disease with chronic diastolic congestive heart failure: is stable b/p 112/62 will continue norvasc 5 mg daily lopressor 12.5 mg twice daily   PREVIOUS   4. Acute respiratory failure with hypoxia and hypercapnia is stable is on 02 at 2liters; will monitor   5. Oropharyngeal dysphagia: is stable no signs of aspiration is on nectar thick liquids.   6.  Type 2 diabetes mellitus without complication without long term current use of insulin: is stable hgb a1c 6.8 will stop amaryl due to high risk of hypoglycemia will continue humalog 5 units with meals for cbg >200  7. Dementia without behavioral disturbance unspecified dementia type: is stable weight is 132 pounds; will continue aricept 5 mg daily   8. Malignant neoplasm of female right breast unspecified estrogen receptor status unspecified site of breast: is without change will continue arimidex 1 mg dail y  24. Hyperlipidemia associated with type 2 diabetes mellitus: is stable will continue zocor 20 mg daily   10. 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 Chronic constipation: is stable will continue miralax daily as needed     MD is aware of resident's narcotic use and is in agreement with current plan of care. We will attempt to wean resident as appropriate.  Ok Edwards NP [facility]  Contact [phone] Monday through Friday 8am- 5pm  After hours call [phone]

## 2020-02-26 ENCOUNTER — Encounter: Payer: Self-pay | Admitting: Adult Health

## 2020-02-26 ENCOUNTER — Ambulatory Visit: Payer: Medicare Other | Admitting: Physician Assistant

## 2020-02-26 ENCOUNTER — Other Ambulatory Visit (HOSPITAL_COMMUNITY): Payer: Self-pay | Admitting: Specialist

## 2020-02-26 DIAGNOSIS — E119 Type 2 diabetes mellitus without complications: Secondary | ICD-10-CM | POA: Insufficient documentation

## 2020-02-26 DIAGNOSIS — J690 Pneumonitis due to inhalation of food and vomit: Secondary | ICD-10-CM

## 2020-02-26 DIAGNOSIS — J9601 Acute respiratory failure with hypoxia: Secondary | ICD-10-CM

## 2020-02-26 NOTE — Progress Notes (Signed)
Location:    [place] Room Number: 149/W Place of Service:  SNF (31)   CODE STATUS: DNR  Allergies  Allergen Reactions  . Codeine   . Zantac [Ranitidine Hcl]     Chief Complaint  Patient presents with  . Acute Visit    Lower Extremity Edema    HPI:    Past Medical History:  Diagnosis Date  . Abnormal uterine and vaginal bleeding, unspecified   . Allergic rhinitis due to pollen   . Anxiety   . Asthma   . Cancer [facility])    breast cancer - right  . Dementia ([place])   . Depression   . Diabetes mellitus without complication ([place])   . Essential (primary) hypertension   . Hyperlipidemia, unspecified   . Hypertension   . Major depressive disorder, single episode, unspecified   . Obesity, unspecified   . Pain in joint involving shoulder region   . Reflux esophagitis   . Trigger finger, acquired   . Type 2 diabetes mellitus with hyperglycemia ([place])   . Unspecified asthma with (acute) exacerbation   . 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 ([place])   . Unspecified osteoarthritis, unspecified site     Past Surgical History:  Procedure Laterality Date  . CHOLECYSTECTOMY    . MASTECTOMY Right   . RE-EXCISION OF BREAST LUMPECTOMY      Social History   Socioeconomic History  . Marital status: Married    Spouse name: Not on file  . Number of children: Not on file  . Years of education: Not on file  . Highest education level: Not on file  Occupational History  . Not on file  Tobacco Use  . Smoking status: Never Smoker  . Smokeless tobacco: Never Used  Substance and Sexual Activity  . Alcohol use: No  . Drug use: No  . Sexual activity: Not on file  Other Topics Concern  . Not on file  Social History Narrative  . Not on file   Social Determinants of Health   Financial Resource Strain:   . Difficulty of Paying Living Expenses:   Food Insecurity:   . Worried About Charity fundraiser in the Last Year:   . Arboriculturist in  the Last Year:   Transportation Needs:   . Film/video editor (Medical):   Marland Kitchen Lack of Transportation (Non-Medical):   Physical Activity:   . Days of Exercise per Week:   . Minutes of Exercise per Session:   Stress:   . Feeling of Stress :   Social Connections:   . Frequency of Communication with Friends and Family:   . Frequency of Social Gatherings with Friends and Family:   . Attends Religious Services:   . Active Member of Clubs or Organizations:   . Attends Archivist Meetings:   Marland Kitchen Marital Status:   Intimate Partner Violence:   . Fear of Current or Ex-Partner:   . Emotionally Abused:   Marland Kitchen Physically Abused:   . Sexually Abused:    Family History  Family history unknown: Yes      VITAL SIGNS BP 133/69   Pulse 67   Temp (!) 97.4 F (36.3 C) (Oral)   Resp 16   Ht 5\' 5"  (1.651 m)   Wt 132 lb 3.2 oz (60 kg)   SpO2 93%   BMI 22.00 kg/m   Outpatient Encounter Medications as of 02/26/2020  Medication Sig  . acetaminophen (TYLENOL) 325 MG tablet Take  2 tablets (650 mg total) by mouth every 6 (six) hours as needed for mild pain (or Fever >/= 101).  Marland Kitchen amiodarone (PACERONE) 200 MG tablet Take 200 mg by mouth 2 (two) times daily.  Derrill Memo ON 03/05/2020] amiodarone (PACERONE) 200 MG tablet Take 200 mg by mouth daily.  Marland Kitchen amLODipine (NORVASC) 5 MG tablet Take 5 mg by mouth daily.  Marland Kitchen anastrozole (ARIMIDEX) 1 MG tablet Take 1 mg by mouth daily.  Marland Kitchen apixaban (ELIQUIS) 5 MG TABS tablet Take 5 mg by mouth 2 (two) times daily.  Marland Kitchen donepezil (ARICEPT) 5 MG tablet Take 5 mg by mouth at bedtime.  . insulin lispro (HUMALOG KWIKPEN) 100 UNIT/ML KwikPen Inject 5 Units into the skin 3 (three) times daily with meals.  . metoprolol tartrate (LOPRESSOR) 25 MG tablet Take 0.5 tablets (12.5 mg total) by mouth 2 (two) times daily.  . Multiple Vitamin (MULTIVITAMIN ADULT PO) Take 1 tablet by mouth daily.  . NON FORMULARY Diet Change: Regular with nectar thick liquids  . polyethylene  glycol (MIRALAX / GLYCOLAX) 17 g packet Take 17 g by mouth daily as needed for mild constipation.  . risperiDONE (RISPERDAL) 0.5 MG tablet Take 1 tablet (0.5 mg total) by mouth at bedtime.  . simvastatin (ZOCOR) 40 MG tablet Take 20 mg by mouth every evening.    No facility-administered encounter medications on file as of 02/26/2020.     SIGNIFICANT DIAGNOSTIC EXAMS       ASSESSMENT/ PLAN:    MD is aware of resident's narcotic use and is in agreement with current plan of care. We will attempt to wean resident as appropriate.  Ok Edwards NP [facility]  Contact [phone] Monday through Friday 8am- 5pm  After hours call [phone]

## 2020-02-27 ENCOUNTER — Encounter: Payer: Self-pay | Admitting: Adult Health

## 2020-02-27 ENCOUNTER — Non-Acute Institutional Stay (SKILLED_NURSING_FACILITY): Payer: Medicare Other | Admitting: Adult Health

## 2020-02-27 ENCOUNTER — Other Ambulatory Visit: Payer: Self-pay | Admitting: *Deleted

## 2020-02-27 DIAGNOSIS — E119 Type 2 diabetes mellitus without complications: Secondary | ICD-10-CM | POA: Diagnosis not present

## 2020-02-27 DIAGNOSIS — Z794 Long term (current) use of insulin: Secondary | ICD-10-CM | POA: Diagnosis not present

## 2020-02-27 NOTE — Patient Outreach (Signed)
Screened for potential [facility] Care Management needs as a benefit of  [employer].  Amy Hoffman is currently receiving skilled therapy at [facility].   Writer attended telephonic interdisciplinary team meeting to assess for disposition needs and transition plan for resident.   Facility reports member is scheduled for barium swallow today. Medication adjustments being made. Family not comfortable with insulin injections. Reports transition plan is to return home with husband with daughter assist intermittently. Facility care plan meeting scheduled with family for this Thursday. Member has confusion. Facility reports there has been no discussion of palliative care or hospice.   Will plan outreach to daughter to discuss [facility] follow up. Will also need to verify PCP.   Marthenia Rolling, MSN-Ed, RN,BSN [place] Acute Care Coordinator [phone] [facility]) [phone]  (Toll free office)

## 2020-02-27 NOTE — Progress Notes (Signed)
Location:    [place] Room Number: 149/W Place of Service:  SNF (31)   CODE STATUS: DNR  Allergies  Allergen Reactions  . Codeine   . Zantac [Ranitidine Hcl]     Chief Complaint  Patient presents with  . Acute Visit    Diabetes    HPI:  She is presently taking humalog with meals to manage her cbg's. Her goal is to return back home with her spouse. Per her family they will not be able to give her insulin and will not be able to check her cbg on a routine basis. She will need po medication to managed her diabetes. There are no reports of hypoglycemia no reports of excessive hunger or agitation.   Past Medical History:  Diagnosis Date  . Abnormal uterine and vaginal bleeding, unspecified   . Allergic rhinitis due to pollen   . Anxiety   . Asthma   . Cancer [facility])    breast cancer - right  . Dementia ([place])   . Depression   . Diabetes mellitus without complication ([place])   . Essential (primary) hypertension   . Hyperlipidemia, unspecified   . Hypertension   . Major depressive disorder, single episode, unspecified   . Obesity, unspecified   . Pain in joint involving shoulder region   . Reflux esophagitis   . Trigger finger, acquired   . Type 2 diabetes mellitus with hyperglycemia ([place])   . Unspecified asthma with (acute) exacerbation   . 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 ([place])   . Unspecified osteoarthritis, unspecified site     Past Surgical History:  Procedure Laterality Date  . CHOLECYSTECTOMY    . MASTECTOMY Right   . RE-EXCISION OF BREAST LUMPECTOMY      Social History   Socioeconomic History  . Marital status: Married    Spouse name: Not on file  . Number of children: Not on file  . Years of education: Not on file  . Highest education level: Not on file  Occupational History  . Not on file  Tobacco Use  . Smoking status: Never Smoker  . Smokeless tobacco: Never Used  Substance and Sexual Activity  . Alcohol  use: No  . Drug use: No  . Sexual activity: Not on file  Other Topics Concern  . Not on file  Social History Narrative  . Not on file   Social Determinants of Health   Financial Resource Strain:   . Difficulty of Paying Living Expenses:   Food Insecurity:   . Worried About Charity fundraiser in the Last Year:   . Arboriculturist in the Last Year:   Transportation Needs:   . Film/video editor (Medical):   Marland Kitchen Lack of Transportation (Non-Medical):   Physical Activity:   . Days of Exercise per Week:   . Minutes of Exercise per Session:   Stress:   . Feeling of Stress :   Social Connections:   . Frequency of Communication with Friends and Family:   . Frequency of Social Gatherings with Friends and Family:   . Attends Religious Services:   . Active Member of Clubs or Organizations:   . Attends Archivist Meetings:   Marland Kitchen Marital Status:   Intimate Partner Violence:   . Fear of Current or Ex-Partner:   . Emotionally Abused:   Marland Kitchen Physically Abused:   . Sexually Abused:    Family History  Family history unknown: Yes  VITAL SIGNS BP 133/69   Pulse 67   Temp (!) 97.4 F (36.3 C) (Oral)   Resp 16   Ht 5\' 5"  (1.651 m)   Wt 132 lb 3.2 oz (60 kg)   SpO2 95%   BMI 22.00 kg/m   Outpatient Encounter Medications as of 02/27/2020  Medication Sig  . acetaminophen (TYLENOL) 325 MG tablet Take 2 tablets (650 mg total) by mouth every 6 (six) hours as needed for mild pain (or Fever >/= 101).  Marland Kitchen amiodarone (PACERONE) 200 MG tablet Take 200 mg by mouth 2 (two) times daily.  Derrill Memo ON 03/05/2020] amiodarone (PACERONE) 200 MG tablet Take 200 mg by mouth daily.  Marland Kitchen amLODipine (NORVASC) 5 MG tablet Take 5 mg by mouth daily.  Marland Kitchen anastrozole (ARIMIDEX) 1 MG tablet Take 1 mg by mouth daily.  Marland Kitchen apixaban (ELIQUIS) 5 MG TABS tablet Take 5 mg by mouth 2 (two) times daily.  Marland Kitchen donepezil (ARICEPT) 5 MG tablet Take 5 mg by mouth at bedtime.  . metoprolol tartrate (LOPRESSOR) 25 MG  tablet Take 0.5 tablets (12.5 mg total) by mouth 2 (two) times daily.  . Multiple Vitamin (MULTIVITAMIN ADULT PO) Take 1 tablet by mouth daily.  . NON FORMULARY Diet Change: Regular with nectar thick liquids  . polyethylene glycol (MIRALAX / GLYCOLAX) 17 g packet Take 17 g by mouth daily as needed for mild constipation.  . risperiDONE (RISPERDAL) 0.5 MG tablet Take 1 tablet (0.5 mg total) by mouth at bedtime.  . simvastatin (ZOCOR) 40 MG tablet Take 20 mg by mouth every evening.   . sitaGLIPtin (JANUVIA) 25 MG tablet Take 25 mg by mouth daily.  . [DISCONTINUED] insulin lispro (HUMALOG KWIKPEN) 100 UNIT/ML KwikPen Inject 5 Units into the skin 3 (three) times daily with meals.   No facility-administered encounter medications on file as of 02/27/2020.     SIGNIFICANT DIAGNOSTIC EXAMS   PREVIOUS  02-05-20: chest x-ray: Worsening patchy bilateral airspace disease compatible with multifocal pneumonia. Small right effusion.  02-06-20: ct of head:  1. Interval development of extensive bilateral upper lobe airspace opacities, right greater than left. Findings are concerning for multifocal pneumonia. 2. New small right pleural effusion and moderate left pleural effusion with overlying areas of compressive type atelectasis. 3. Aortic atherosclerosis, in addition to RCA and LAD coronary artery disease. Please note that although the presence of coronary artery calcium documents the presence of coronary artery disease, the severity of this disease and any potential stenosis cannot be assessed on this non-gated CT examination. Assessment for potential risk factor modification, dietary therapy or pharmacologic therapy may be warranted, if clinically indicated. Aortic Atherosclerosis   02-13-20: chest x-ray:  1. Multifocal bilateral pulmonary infiltrates and small bilateral pleural effusions again noted. Similar findings noted on prior exam. 2.  Stable cardiomegaly.  02-13-20: swallow study: Recommend  D3/mech soft and NTL   NO NEW EXAMS.   LABS REVIEWED PREVIOUS  12-16-19: hgb  a1c 6.8  02-05-20: wbc 8.5; hgb 9.8; hct 32.4; mcv 94.7plt 344 glucose 285; bun 20; creat 0.70; k+ 3.0; na++ 143; ca 8.3 mag 2.2 blood culture: no growth 02-06-20: vit B 12: 725; folate 6.2; ammonia 38; tsh 1.503; free t4: 1.24 02-08-20: wbc 7.6; hgb 10.1; hct 34.4; mcv 98.6 plt 299; glucose 113; bun 12; creat 0.64; k+ 3.2; na++ 146; ca 7.9  02-10-20: glucose 118; bun 19; creat 0.70; k+ 3.1; na++ 143; ca 7.9 liver normal albumin 2.1 02-14-20: wbc 5.8; hgb 9.0; hct 29.7; mcv 94.9  plt 245; glucose 125; bun 27; creat 0.80; k+ 3.9; na++ 142; ca 8.5   NO NEW LABS.   Review of Systems  Unable to perform ROS: Dementia (unable to participate )    Physical Exam Constitutional:      General: She is not in acute distress.    Appearance: She is well-developed. She is not diaphoretic.  Neck:     Thyroid: No thyromegaly.  Cardiovascular:     Rate and Rhythm: Normal rate and regular rhythm.     Pulses: Normal pulses.     Heart sounds: Normal heart sounds.  Pulmonary:     Effort: Pulmonary effort is normal. No respiratory distress.     Breath sounds: Normal breath sounds.  Abdominal:     General: Bowel sounds are normal. There is no distension.     Palpations: Abdomen is soft.     Tenderness: There is no abdominal tenderness.  Musculoskeletal:        General: Normal range of motion.     Cervical back: Neck supple.     Right lower leg: No edema.     Left lower leg: No edema.  Lymphadenopathy:     Cervical: No cervical adenopathy.  Skin:    General: Skin is warm and dry.  Neurological:     Mental Status: She is alert. Mental status is at baseline.  Psychiatric:        Mood and Affect: Mood normal.        ASSESSMENT/ PLAN:  TODAY  1. Type 2 diabetes mellitus without complication without long term current use of insulin: hgb a1c 6.8 her cbg's are stable will stop insulin and will begin januvia 25 mg daily as  her family will not be able to inject insulin and will not be able to monitor cbg's on a routine basis.     MD is aware of resident's narcotic use and is in agreement with current plan of care. We will attempt to wean resident as appropriate.  Ok Edwards NP [facility] Adult [facility]  Contact [phone] Monday through Friday 8am- 5pm  After hours call [phone]

## 2020-02-28 ENCOUNTER — Ambulatory Visit (HOSPITAL_COMMUNITY): Payer: Medicare Other | Attending: Adult Health | Admitting: Speech Pathology

## 2020-02-28 ENCOUNTER — Inpatient Hospital Stay (HOSPITAL_COMMUNITY)
Admit: 2020-02-28 | Discharge: 2020-02-28 | Disposition: A | Discharge status: Home / Self Care | Payer: Medicare Other | Attending: Adult Health | Admitting: Adult Health

## 2020-02-28 ENCOUNTER — Encounter (HOSPITAL_COMMUNITY): Payer: Self-pay | Admitting: Speech Pathology

## 2020-02-28 DIAGNOSIS — J9601 Acute respiratory failure with hypoxia: Secondary | ICD-10-CM

## 2020-02-28 DIAGNOSIS — J690 Pneumonitis due to inhalation of food and vomit: Secondary | ICD-10-CM

## 2020-02-28 DIAGNOSIS — R1312 Dysphagia, oropharyngeal phase: Secondary | ICD-10-CM | POA: Insufficient documentation

## 2020-02-28 NOTE — Therapy (Signed)
[place], [place], [address] Phone: [phone]   Fax:  [phone]  Modified Barium Swallow  Patient Details  Name: Amy Hoffman MRN: [id number] Date of Birth: 05/28/1941 No data recorded  Encounter Date: 02/28/2020   End of Session - 02/28/20 1344    Visit Number 1    Number of Visits 1    Authorization Type Medicare    SLP Start Time 1138    SLP Stop Time  6503    SLP Time Calculation (min) 27 min    Activity Tolerance Patient tolerated treatment well           Past Medical History:  Diagnosis Date  . Abnormal uterine and vaginal bleeding, unspecified   . Allergic rhinitis due to pollen   . Anxiety   . Asthma   . Cancer [facility])    breast cancer - right  . Dementia ([place])   . Depression   . Diabetes mellitus without complication ([place])   . Essential (primary) hypertension   . Hyperlipidemia, unspecified   . Hypertension   . Major depressive disorder, single episode, unspecified   . Obesity, unspecified   . Pain in joint involving shoulder region   . Reflux esophagitis   . Trigger finger, acquired   . Type 2 diabetes mellitus with hyperglycemia ([place])   . Unspecified asthma with (acute) exacerbation   . 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 ([place])   . Unspecified osteoarthritis, unspecified site     Past Surgical History:  Procedure Laterality Date  . CHOLECYSTECTOMY    . MASTECTOMY Right   . RE-EXCISION OF BREAST LUMPECTOMY      There were no vitals filed for this visit.   Subjective Assessment - 02/28/20 1332    Subjective "Hi"    Patient is accompained by: --   treating SLP at SNF   Special Tests MBSS    Currently in Pain? No/denies               General - 02/28/20 1333      General Information   Date of Onset 02/05/20    HPI Amy Hoffman is a 79 yo female who was referred by Ok Edwards, NP at the [facility] for MBSS following an MBSS completed in early June  while in acute care. Patient was hospitalized 5/24-02/14/2020 with acute respiratory failure and hypoxia.  She has baseline dementia but was drowsy at presentation. Chest x-ray revealed bilateral infiltrates. MBSS was completed on 02/14/20 with recommendation for D3 and NTL. Past medical and surgical history: Includes history of asthma, diabetes with vascular complication, reflux esophagitis, history of depression, essential hypertension, dyslipidemia, history of breast cancer and dementia.    Type of Study MBS-Modified Barium Swallow Study    Previous Swallow Assessment MBSS 02/13/20 D3 and NTL    Diet Prior to this Study Regular;Nectar-thick liquids    Temperature Spikes Noted No    Respiratory Status Room air    History of Recent Intubation No    Behavior/Cognition Alert;Cooperative    Oral Cavity Assessment Within Functional Limits    Oral Care Completed by SLP No    Oral Cavity - Dentition Adequate natural dentition    Vision Functional for self feeding    Self-Feeding Abilities Able to feed self    Patient Positioning Upright in chair    Baseline Vocal Quality Normal    Volitional Cough Strong    Volitional Swallow Able to elicit    Anatomy  Within functional limits    Pharyngeal Secretions Not observed secondary MBS              Oral Preparation/Oral Phase - 02/28/20 1334      Oral Preparation/Oral Phase   Oral Phase Impaired      Oral - Nectar   Oral - Nectar Cup Oral residue      Oral - Thin   Oral - Thin Teaspoon Oral residue;Decreased bolus cohesion    Oral - Thin Cup Oral residue;Decreased bolus cohesion    Oral - Thin Straw Oral residue;Decreased bolus cohesion      Oral - Solids   Oral - Puree Oral residue;Decreased bolus cohesion    Oral - Regular Oral residue;Delayed A-P transit    Oral - Pill Within functional limits   in puree     Electrical stimulation - Oral Phase   Was Electrical Stimulation Used No            Pharyngeal Phase - 02/28/20 1337       Pharyngeal Phase   Pharyngeal Phase Impaired      Pharyngeal - Nectar   Pharyngeal- Nectar Cup Swallow initiation at vallecula;Penetration/Aspiration during swallow;Reduced tongue base retraction;Pharyngeal residue - valleculae;Pharyngeal residue - pyriform    Pharyngeal Material does not enter airway;Material enters airway, remains ABOVE vocal cords then ejected out      Pharyngeal - Thin   Pharyngeal- Thin Teaspoon Swallow initiation at vallecula;Swallow initiation at pyriform sinus;Penetration/Aspiration before swallow;Penetration/Apiration after swallow;Trace aspiration;Reduced tongue base retraction;Reduced airway/laryngeal closure;Pharyngeal residue - valleculae;Pharyngeal residue - pyriform    Pharyngeal Material enters airway, remains ABOVE vocal cords then ejected out;Material enters airway, remains ABOVE vocal cords and not ejected out;Material enters airway, passes BELOW cords then ejected out    Pharyngeal- Thin Cup Swallow initiation at pyriform sinus;Penetration/Aspiration during swallow;Pharyngeal residue - valleculae;Pharyngeal residue - pyriform;Lateral channel residue;Trace aspiration    Pharyngeal Material does not enter airway;Material enters airway, remains ABOVE vocal cords then ejected out;Material enters airway, passes BELOW cords and not ejected out despite cough attempt by patient    Pharyngeal- Thin Straw Swallow initiation at pyriform sinus;Reduced airway/laryngeal closure;Penetration/Aspiration during swallow;Pharyngeal residue - valleculae;Pharyngeal residue - pyriform;Lateral channel residue    Pharyngeal Material enters airway, remains ABOVE vocal cords then ejected out      Pharyngeal - Solids   Pharyngeal- Puree Swallow initiation at vallecula;Reduced tongue base retraction    Pharyngeal- Regular Delayed swallow initiation-vallecula;Reduced tongue base retraction;Pharyngeal residue - valleculae    Pharyngeal- Pill Within functional limits   in puree      Electrical Stimulation - Pharyngeal Phase   Was Electrical Stimulation Used No            Cricopharyngeal Phase - 02/28/20 1343      Cervical Esophageal Phase   Cervical Esophageal Phase Within functional limits             Plan - 02/28/20 1345    Clinical Impression Statement Pt demonstrates a slight improvement in oropharyngeal swallow from objective assessment completed two weeks ago. She continues to present with mild oropharyngeal dysphagia characterized by reduced oral control with reduced liquid bolus cohesiveness resulting in premature spillage to the pyriforms and delay in swallow initiation with variable penetration and trace aspiration of thins which was due to residuals spilling from the oral cavity into the pharynx after the initial swallow. Pt generally sensate to aspiration, but was not always able to remove due to dropping down deep anterior tracheal wall. Chin tuck implemented  and was effective on the initial swallow, but Pt continues to have oral residuals which pool in the valleculae and pyriforms. She required verbal cues to repeat/dry swallow to clear (effective). Puree and regular textures essentially WNL. Pt with flash penetration of NTL and no aspiration. Pt does better to take small sips, however she required cues to implement. Pt reportedly has a history of occasional coughing with thin liquids at home and was in the hospital with PNA in May. Recommend continuing NTL with trials of thin liquids (small sips and repeat/dry swallow) and pills whole in puree, ok to continue with regular textures. Can consider upgrading to thin clinically if treating SLP feels appropriate and after discussions with family. Pt will likely experience episodic aspiration of thins and likely has in the past.           Patient will benefit from skilled therapeutic intervention in order to improve the following deficits and impairments:   Dysphagia, oropharyngeal phase      Recommendations/Treatment - 02/28/20 1343      Swallow Evaluation Recommendations   SLP Diet Recommendations Age appropriate regular;Nectar    Liquid Administration via Cup    Medication Administration Whole meds with puree    Supervision Patient able to self feed    Compensations Slow rate;Small sips/bites;Multiple dry swallows after each bite/sip;Clear throat intermittently;Chin tuck   ok to try chin tuck- see notes   Postural Changes Seated upright at 90 degrees;Remain upright for at least 30 minutes after feeds/meals            Prognosis - 02/28/20 1344      Prognosis   Prognosis for Safe Diet Advancement Fair    Barriers to Reach Goals Cognitive deficits;Severity of deficits      Individuals Consulted   Consulted and Agree with Results and Recommendations Patient    Report Sent to  Referring physician           Problem List Patient Active Problem List   Diagnosis Date Noted  . Controlled type 2 diabetes mellitus without complication, with long-term current use of insulin ([place]) 02/26/2020  . Hypertensive heart disease with chronic diastolic congestive heart failure ([place]) 02/16/2020  . Hyperlipidemia associated with type 2 diabetes mellitus ([place]) 02/16/2020  . 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 Do not resuscitate status   . Acute respiratory failure with hypoxia and hypercarbia ([place]) 02/06/2020  . Acute diastolic CHF (congestive heart failure) ([place]) 02/06/2020  . Acute metabolic encephalopathy 49/67/5916  . Multifocal pneumonia 02/05/2020  . CHF (congestive heart failure) ([place]) 02/05/2020  . Hypokalemia   . Hypoxia   . Low oxygen saturation   . Advanced care planning/counseling discussion   . Goals of care, counseling/discussion   . Acute respiratory failure with hypoxia ([place])   . Dementia with behavioral disturbance ([place])   . Palliative care by specialist   . Asthma exacerbation 12/16/2019  . Atrial fibrillation ([place])  12/16/2019  . Community acquired pneumonia 12/16/2019  . Hyperlipidemia, unspecified   . Obesity, unspecified   . Type 2 diabetes mellitus with hemoglobin A1c goal of less than 7.0% ([facility])   . Abnormal uterine and vaginal bleeding, unspecified   . Allergic rhinitis due to pollen   . Essential (primary) hypertension   . Major depressive disorder, single episode, unspecified   . Unspecified asthma with (acute) exacerbation   . Unspecified asthma, uncomplicated   . Unspecified osteoarthritis, unspecified site   . Pain in joint involving shoulder region   .  Dysphagia 07/10/2019  . Loss of weight 07/10/2019  . Numbness and tingling in hands 04/17/2014  . SOB (shortness of breath) 01/30/2014  . Breast CA [facility]) 01/30/2014   Thank you,  Genene Churn, [place]  [facility] 02/28/2020, 1:46 PM  [place] [address] [place], [place], [address] Phone: [phone]   Fax:  [phone]  Name: ZYAIR RHEIN MRN: [id number] Date of Birth: [birth year]

## 2020-02-29 ENCOUNTER — Other Ambulatory Visit (HOSPITAL_COMMUNITY)
Admission: RE | Admit: 2020-02-29 | Discharge: 2020-02-29 | Disposition: A | Discharge status: Home / Self Care | Payer: Medicare Other | Source: Skilled Nursing Facility | Attending: Adult Health | Admitting: Adult Health

## 2020-02-29 ENCOUNTER — Non-Acute Institutional Stay (SKILLED_NURSING_FACILITY): Payer: Medicare Other | Admitting: Adult Health

## 2020-02-29 ENCOUNTER — Encounter: Payer: Self-pay | Admitting: Adult Health

## 2020-02-29 ENCOUNTER — Other Ambulatory Visit: Payer: Self-pay | Admitting: *Deleted

## 2020-02-29 DIAGNOSIS — R1312 Dysphagia, oropharyngeal phase: Secondary | ICD-10-CM | POA: Diagnosis not present

## 2020-02-29 DIAGNOSIS — F0391 Unspecified dementia with behavioral disturbance: Secondary | ICD-10-CM

## 2020-02-29 DIAGNOSIS — E119 Type 2 diabetes mellitus without complications: Secondary | ICD-10-CM

## 2020-02-29 DIAGNOSIS — E1169 Type 2 diabetes mellitus with other specified complication: Secondary | ICD-10-CM | POA: Insufficient documentation

## 2020-02-29 LAB — HEMOGLOBIN AND HEMATOCRIT, BLOOD
HCT: 30.1 % — ABNORMAL LOW (ref 36.0–46.0)
Hemoglobin: 9.5 g/dL — ABNORMAL LOW (ref 12.0–15.0)

## 2020-02-29 LAB — BASIC METABOLIC PANEL
Anion gap: 9 (ref 5–15)
BUN: 51 mg/dL — ABNORMAL HIGH (ref 8–23)
CO2: 28 mmol/L (ref 22–32)
Calcium: 8.6 mg/dL — ABNORMAL LOW (ref 8.9–10.3)
Chloride: 100 mmol/L (ref 98–111)
Creatinine, Ser: 1.75 mg/dL — ABNORMAL HIGH (ref 0.44–1.00)
GFR calc Af Amer: 32 mL/min — ABNORMAL LOW (ref >60)
GFR calc non Af Amer: 27 mL/min — ABNORMAL LOW (ref >60)
Glucose, Bld: 121 mg/dL — ABNORMAL HIGH (ref 70–99)
Potassium: 4.5 mmol/L (ref 3.5–5.1)
Sodium: 137 mmol/L (ref 135–145)

## 2020-02-29 NOTE — Progress Notes (Signed)
Location:    [facility] Room Number: 149/W Place of Service:  SNF (31)   CODE STATUS: DNR  Allergies  Allergen Reactions  . Codeine   . Zantac [Ranitidine Hcl]     Chief Complaint  Patient presents with  . Short Term Rehab    Oropharyngeal dysphagia:  Type 2 diabetes mellitus without complications without long term current use of insulin:  Dementia without behavioral disturbance unspecified dementia type    HPI:  She is a 79 year old short term rehab patient being seen for the management of her chronic illnesses: dysphagia; diabetes; dementia. There are no reports of uncontrolled pain. No reports of agitation or anxiety.  We have come together for her care plan meeting. Unable to do BIMS; mood 0/30. Her weight is stable; she continues to require extensive assistance with her adls and is incontinent of bladder and bowel.   Past Medical History:  Diagnosis Date  . Abnormal uterine and vaginal bleeding, unspecified   . Allergic rhinitis due to pollen   . Anxiety   . Asthma   . Cancer [facility])    breast cancer - right  . Dementia ([place])   . Depression   . Diabetes mellitus without complication ([place])   . Essential (primary) hypertension   . Hyperlipidemia, unspecified   . Hypertension   . Major depressive disorder, single episode, unspecified   . Obesity, unspecified   . Pain in joint involving shoulder region   . Reflux esophagitis   . Trigger finger, acquired   . Type 2 diabetes mellitus with hyperglycemia ([place])   . Unspecified asthma with (acute) exacerbation   . 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 ([place])   . Unspecified osteoarthritis, unspecified site     Past Surgical History:  Procedure Laterality Date  . CHOLECYSTECTOMY    . MASTECTOMY Right   . RE-EXCISION OF BREAST LUMPECTOMY      Social History   Socioeconomic History  . Marital status: Married    Spouse name: Not on file  . Number of children: Not on file  . Years  of education: Not on file  . Highest education level: Not on file  Occupational History  . Not on file  Tobacco Use  . Smoking status: Never Smoker  . Smokeless tobacco: Never Used  Substance and Sexual Activity  . Alcohol use: No  . Drug use: No  . Sexual activity: Not on file  Other Topics Concern  . Not on file  Social History Narrative  . Not on file   Social Determinants of Health   Financial Resource Strain:   . Difficulty of Paying Living Expenses:   Food Insecurity:   . Worried About Charity fundraiser in the Last Year:   . Arboriculturist in the Last Year:   Transportation Needs:   . Film/video editor (Medical):   Marland Kitchen Lack of Transportation (Non-Medical):   Physical Activity:   . Days of Exercise per Week:   . Minutes of Exercise per Session:   Stress:   . Feeling of Stress :   Social Connections:   . Frequency of Communication with Friends and Family:   . Frequency of Social Gatherings with Friends and Family:   . Attends Religious Services:   . Active Member of Clubs or Organizations:   . Attends Archivist Meetings:   Marland Kitchen Marital Status:   Intimate Partner Violence:   . Fear of Current or Ex-Partner:   .  Emotionally Abused:   Marland Kitchen Physically Abused:   . Sexually Abused:    Family History  Family history unknown: Yes      VITAL SIGNS BP 133/69   Pulse 67   Temp (!) 97.4 F (36.3 C) (Oral)   Resp 16   Ht 5\' 5"  (1.651 m)   Wt 132 lb 3.2 oz (60 kg)   SpO2 96%   BMI 22.00 kg/m   Outpatient Encounter Medications as of 02/29/2020  Medication Sig  . acetaminophen (TYLENOL) 325 MG tablet Take 2 tablets (650 mg total) by mouth every 6 (six) hours as needed for mild pain (or Fever >/= 101).  Marland Kitchen amiodarone (PACERONE) 200 MG tablet Take 200 mg by mouth 2 (two) times daily.  Derrill Memo ON 03/05/2020] amiodarone (PACERONE) 200 MG tablet Take 200 mg by mouth daily.  Marland Kitchen amLODipine (NORVASC) 5 MG tablet Take 5 mg by mouth daily.  Marland Kitchen anastrozole  (ARIMIDEX) 1 MG tablet Take 1 mg by mouth daily.  Marland Kitchen apixaban (ELIQUIS) 5 MG TABS tablet Take 5 mg by mouth 2 (two) times daily.  Marland Kitchen donepezil (ARICEPT) 5 MG tablet Take 5 mg by mouth at bedtime.  . metoprolol tartrate (LOPRESSOR) 25 MG tablet Take 0.5 tablets (12.5 mg total) by mouth 2 (two) times daily.  . Multiple Vitamin (MULTIVITAMIN ADULT PO) Take 1 tablet by mouth daily.  . NON FORMULARY Diet Change: Regular with nectar thick liquids  . polyethylene glycol (MIRALAX / GLYCOLAX) 17 g packet Take 17 g by mouth daily as needed for mild constipation.  . risperiDONE (RISPERDAL) 0.5 MG tablet Take 1 tablet (0.5 mg total) by mouth at bedtime.  . simvastatin (ZOCOR) 40 MG tablet Take 20 mg by mouth every evening.   . sitaGLIPtin (JANUVIA) 25 MG tablet Take 25 mg by mouth daily.   No facility-administered encounter medications on file as of 02/29/2020.     SIGNIFICANT DIAGNOSTIC EXAMS   PREVIOUS  02-05-20: chest x-ray: Worsening patchy bilateral airspace disease compatible with multifocal pneumonia. Small right effusion.  02-06-20: ct of head:  1. Interval development of extensive bilateral upper lobe airspace opacities, right greater than left. Findings are concerning for multifocal pneumonia. 2. New small right pleural effusion and moderate left pleural effusion with overlying areas of compressive type atelectasis. 3. Aortic atherosclerosis, in addition to RCA and LAD coronary artery disease. Please note that although the presence of coronary artery calcium documents the presence of coronary artery disease, the severity of this disease and any potential stenosis cannot be assessed on this non-gated CT examination. Assessment for potential risk factor modification, dietary therapy or pharmacologic therapy may be warranted, if clinically indicated. Aortic Atherosclerosis   02-13-20: chest x-ray:  1. Multifocal bilateral pulmonary infiltrates and small bilateral pleural effusions again noted.  Similar findings noted on prior exam. 2.  Stable cardiomegaly.  02-13-20: swallow study: Recommend D3/mech soft and NTL   NO NEW EXAMS.   LABS REVIEWED PREVIOUS  12-16-19: hgb  a1c 6.8  02-05-20: wbc 8.5; hgb 9.8; hct 32.4; mcv 94.7plt 344 glucose 285; bun 20; creat 0.70; k+ 3.0; na++ 143; ca 8.3 mag 2.2 blood culture: no growth 02-06-20: vit B 12: 725; folate 6.2; ammonia 38; tsh 1.503; free t4: 1.24 02-08-20: wbc 7.6; hgb 10.1; hct 34.4; mcv 98.6 plt 299; glucose 113; bun 12; creat 0.64; k+ 3.2; na++ 146; ca 7.9  02-10-20: glucose 118; bun 19; creat 0.70; k+ 3.1; na++ 143; ca 7.9 liver normal albumin 2.1 02-14-20: wbc 5.8; hgb  9.0; hct 29.7; mcv 94.9 plt 245; glucose 125; bun 27; creat 0.80; k+ 3.9; na++ 142; ca 8.5   TODAY  02-29-20: glucose 121; bun 51; creat 1.75; k+ 4.5; na++ 137 ca 8.6    Review of Systems  Unable to perform ROS: Dementia (unable to participate )    Physical Exam Constitutional:      General: She is not in acute distress.    Appearance: She is well-developed. She is not diaphoretic.  Neck:     Thyroid: No thyromegaly.  Cardiovascular:     Rate and Rhythm: Normal rate and regular rhythm.     Pulses: Normal pulses.     Heart sounds: Normal heart sounds.  Pulmonary:     Effort: Pulmonary effort is normal. No respiratory distress.     Breath sounds: Normal breath sounds.  Abdominal:     General: Bowel sounds are normal. There is no distension.     Palpations: Abdomen is soft.     Tenderness: There is no abdominal tenderness.  Musculoskeletal:        General: Normal range of motion.     Cervical back: Neck supple.     Right lower leg: No edema.     Left lower leg: No edema.  Lymphadenopathy:     Cervical: No cervical adenopathy.  Skin:    General: Skin is warm and dry.  Neurological:     Mental Status: She is alert. Mental status is at baseline.  Psychiatric:        Mood and Affect: Mood normal.        ASSESSMENT/ PLAN:   TODAY  1.  Oropharyngeal dysphagia: is stable no signs of aspiration present; will continue nectar thick liquids  2. Type 2 diabetes mellitus without complications without long term current use of insulin: is stable hgb a1c 6.8 will continue januvia 25 mg daily   3. Dementia without behavioral disturbance unspecified dementia type: is stable weight is 132 pounds will continue aricept 5 mg daily   PREVIOUS   4. Malignant neoplasm of female right breast unspecified estrogen receptor status unspecified site of breast: is without change will continue arimidex 1 mg dail y  5. Hyperlipidemia associated with type 2 diabetes mellitus: is stable will continue zocor 20 mg daily   6. 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 Chronic constipation: is stable will continue miralax daily as needed   8. Acute diastolic CHF (congestive heart failure) is stable EF 50-55% (12-17-19) is compensated will continue lopressor 12.5 mg twice daily   9. Longstanding persistent atrial fibrillation: heart rate is stable will continue amiodarone 400 mg twice daily through 02-20-20 then 200 mg twice daily through 03-04-20 then 200 mg daily; lopressor 12.5 mg twice daily for rate control and eliquis 5 mg twice daily   10. Hypertensive heart disease with chronic diastolic congestive heart failure: is stable b/p 133/69 will continue norvasc 5 mg daily lopressor 12.5 mg twice daily   MD is aware of resident's narcotic use and is in agreement with current plan of care. We will attempt to wean resident as appropriate.  Ok Edwards NP [facility]  Contact [phone] Monday through Friday 8am- 5pm  After hours call [phone]

## 2020-02-29 NOTE — Patient Outreach (Signed)
[facility] Post- Acute Care Coordinator follow up  Amy Hoffman remains at [facility] receiving skilled therapy. Facility previously indicated transition plan is to return home with husband with intermittent assist of the daughter.   Telephone call made to daughter/[name] Carron Brazen [phone] to discuss transition plans and potential [facility] Care Management services. No answer. HIPAA compliant voicemail message left to request return call. Also need to verify PCP.  Will plan outreach again to daughter.   Marthenia Rolling, MSN-Ed, RN,BSN [facility] Acute Care Coordinator [phone] [facility]) [phone]  (Toll free office)

## 2020-03-01 ENCOUNTER — Other Ambulatory Visit: Payer: Self-pay | Admitting: Internal Medicine

## 2020-03-01 ENCOUNTER — Non-Acute Institutional Stay (SKILLED_NURSING_FACILITY): Payer: Medicare Other | Admitting: Internal Medicine

## 2020-03-01 ENCOUNTER — Encounter: Payer: Self-pay | Admitting: Internal Medicine

## 2020-03-01 ENCOUNTER — Encounter (HOSPITAL_COMMUNITY)
Admission: RE | Admit: 2020-03-01 | Discharge: 2020-03-01 | Disposition: A | Discharge status: Home / Self Care | Payer: Medicare Other | Source: Skilled Nursing Facility | Attending: Adult Health | Admitting: Adult Health

## 2020-03-01 DIAGNOSIS — N179 Acute kidney failure, unspecified: Secondary | ICD-10-CM | POA: Diagnosis not present

## 2020-03-01 DIAGNOSIS — N1832 Chronic kidney disease, stage 3b: Secondary | ICD-10-CM | POA: Insufficient documentation

## 2020-03-01 DIAGNOSIS — R001 Bradycardia, unspecified: Secondary | ICD-10-CM | POA: Diagnosis not present

## 2020-03-01 LAB — LIPID PANEL
Cholesterol: 123 mg/dL (ref 0–200)
HDL: 59 mg/dL (ref >40)
LDL Cholesterol: 56 mg/dL (ref 0–99)
Total CHOL/HDL Ratio: 2.1 RATIO
Triglycerides: 40 mg/dL (ref <150)
VLDL: 8 mg/dL (ref 0–40)

## 2020-03-01 NOTE — Assessment & Plan Note (Signed)
1 L normal saline at 50 cc an hour x24 hours. Repeat BMET in the morning

## 2020-03-01 NOTE — Progress Notes (Signed)
This encounter was created in error - please disregard.

## 2020-03-01 NOTE — Patient Instructions (Signed)
See assessment and plan under each diagnosis in the problem list and acutely for this visit 

## 2020-03-01 NOTE — Progress Notes (Signed)
   NURSING HOME LOCATION:  [facility]  ROOM NUMBER:  [id number]  CODE STATUS: DNR   PCP:  Dr Allene Dillon, [facility]  This is a nursing facility follow up for specific acute issues of clinical dehydration, hypoxia, and bradycardia.  Interim medical record and care since last [place] visit was updated with review of diagnostic studies and change in clinical status since last visit were documented.  HPI: The patient was admitted to the SNF on 6/2 after being hospitalized 5/24-6/2 with acute respiratory failure and hypoxia.  Acute diastolic congestive heart failure was diagnosed.  Course was complicated by transient A. fib with RVR.  She also received cefepime and aggressive pulmonary toilet. Dysphagia 3 diet was prescribed.  Here at the SNF she has been transitioned to thickened liquids.  Significant past history includes asthma, diabetes with vascular complications, reflux esophagitis, essential hypertension, dyslipidemia, history of breast cancer, and dementia.  Staff reports that she was more lethargic earlier today.  She has been repeatedly removing her nasal oxygen resulting in hypoxia down to the high 60% range.  Automated pulse was recorded at 78 but manual recording is 49-55.  As noted she did have PAF while hospitalized.  She is on amiodarone and low-dose beta-blocker.  TSH was therapeutic 02/06/2020. Labs performed yesterday reveal significant deterioration in renal function in the last 2 weeks.  From 6/2-6/17 her BUN has risen from 27 to 51 and creatinine from 0.8 up to 1.75.  GFR has dropped from greater than 60 to 27. Her anemia has improved slightly since 6/2 with hemoglobin 9.5/hematocrit 30.1.  Review of systems: Dementia invalidated responses.  Her only response was " fine".  She could not give me the date, even the year.  She denied any active symptoms with special reference to cardiopulmonary symptoms.  Physical exam:  Pertinent or positive  findings: Her nasal oxygen was on the floor.  Facies are blank.  There appears to be subtle anisocoria with minimal asymmetric size of the pupils.  Chest was surprisingly clear.  Pulse is profoundly slow and irregular ranging from 49-55.  1+ edema at the sock line.  Decreased pedal pulses.  The posterior tibial pulses appear stronger than the dorsalis pedis pulses.  She exhibits some intermittent myoclonic jerking tremor of the upper extremities.  General appearance: Adequately nourished; no acute distress, increased work of breathing is present.   Lymphatic: No lymphadenopathy about the head, neck, axilla. Eyes: No conjunctival inflammation or lid edema is present. There is no scleral icterus. Ears:  External ear exam shows no significant lesions or deformities.   Nose:  External nasal examination shows no deformity or inflammation. Nasal mucosa are pink and moist without lesions, exudates Oral exam:  Lips and gums are healthy appearing. There is no oropharyngeal erythema or exudate. Neck:  No thyromegaly, masses, tenderness noted.    Heart:  No murmur, click, rub .  Lungs: without wheezes, rhonchi, rales, rubs. Abdomen: Bowel sounds are normal. Abdomen is soft and nontender with no organomegaly, hernias, masses. GU: Deferred  Extremities:  No cyanosis, clubbing  Neurologic exam :Balance, Rhomberg, finger to nose testing could not be completed due to clinical state Skin: Warm & dry w/o tenting. No significant lesions or rash.  See summary under each active problem in the Problem List with associated updated therapeutic plan

## 2020-03-01 NOTE — Assessment & Plan Note (Signed)
Bradyarrhythmia with pulse rates 49-55. She is on amiodarone; TSH therapeutic 5/25  Hold metoprolol for pulse less than 80.

## 2020-03-03 ENCOUNTER — Encounter (HOSPITAL_COMMUNITY)
Admission: RE | Admit: 2020-03-03 | Discharge: 2020-03-03 | Disposition: A | Discharge status: Home / Self Care | Payer: Medicare Other | Source: Skilled Nursing Facility | Attending: *Deleted | Admitting: *Deleted

## 2020-03-03 LAB — BASIC METABOLIC PANEL
Anion gap: 12 (ref 5–15)
BUN: 42 mg/dL — ABNORMAL HIGH (ref 8–23)
CO2: 25 mmol/L (ref 22–32)
Calcium: 9.1 mg/dL (ref 8.9–10.3)
Chloride: 100 mmol/L (ref 98–111)
Creatinine, Ser: 1.07 mg/dL — ABNORMAL HIGH (ref 0.44–1.00)
GFR calc Af Amer: 57 mL/min — ABNORMAL LOW (ref >60)
GFR calc non Af Amer: 49 mL/min — ABNORMAL LOW (ref >60)
Glucose, Bld: 246 mg/dL — ABNORMAL HIGH (ref 70–99)
Potassium: 4.4 mmol/L (ref 3.5–5.1)
Sodium: 137 mmol/L (ref 135–145)

## 2020-03-04 ENCOUNTER — Encounter: Payer: Self-pay | Admitting: Adult Health

## 2020-03-04 ENCOUNTER — Non-Acute Institutional Stay (SKILLED_NURSING_FACILITY): Payer: Medicare Other | Admitting: Adult Health

## 2020-03-04 DIAGNOSIS — J9611 Chronic respiratory failure with hypoxia: Secondary | ICD-10-CM

## 2020-03-04 DIAGNOSIS — N179 Acute kidney failure, unspecified: Secondary | ICD-10-CM | POA: Diagnosis not present

## 2020-03-04 DIAGNOSIS — J90 Pleural effusion, not elsewhere classified: Secondary | ICD-10-CM

## 2020-03-04 DIAGNOSIS — I5032 Chronic diastolic (congestive) heart failure: Secondary | ICD-10-CM | POA: Diagnosis not present

## 2020-03-04 NOTE — Progress Notes (Signed)
Location:    [place] Room Number: 149/W Place of Service:  SNF (31)   CODE STATUS: DNR  Allergies  Allergen Reactions  . Codeine   . Zantac [Ranitidine Hcl]     Chief Complaint  Patient presents with  . Follow-up    Follow Up Status    HPI:  She has received IVF over the past weekend. She is short of breath with wheezing present. Her chest x-ray demonstrated right pleural effusion present. She will need to be placed on daily weights. There are no reports of fevers present. She is on chronic 02 but needs cueing to keep in place.   Past Medical History:  Diagnosis Date  . Abnormal uterine and vaginal bleeding, unspecified   . Allergic rhinitis due to pollen   . Anxiety   . Asthma   . Cancer [facility])    breast cancer - right  . Dementia ([place])   . Depression   . Diabetes mellitus without complication ([place])   . Essential (primary) hypertension   . Hyperlipidemia, unspecified   . Hypertension   . Major depressive disorder, single episode, unspecified   . Obesity, unspecified   . Pain in joint involving shoulder region   . Reflux esophagitis   . Trigger finger, acquired   . Type 2 diabetes mellitus with hyperglycemia ([place])   . Unspecified asthma with (acute) exacerbation   . 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 ([place])   . Unspecified osteoarthritis, unspecified site     Past Surgical History:  Procedure Laterality Date  . CHOLECYSTECTOMY    . MASTECTOMY Right   . RE-EXCISION OF BREAST LUMPECTOMY      Social History   Socioeconomic History  . Marital status: Married    Spouse name: Not on file  . Number of children: Not on file  . Years of education: Not on file  . Highest education level: Not on file  Occupational History  . Not on file  Tobacco Use  . Smoking status: Never Smoker  . Smokeless tobacco: Never Used  Substance and Sexual Activity  . Alcohol use: No  . Drug use: No  . Sexual activity: Not on file    Other Topics Concern  . Not on file  Social History Narrative  . Not on file   Social Determinants of Health   Financial Resource Strain:   . Difficulty of Paying Living Expenses:   Food Insecurity:   . Worried About Charity fundraiser in the Last Year:   . Arboriculturist in the Last Year:   Transportation Needs:   . Film/video editor (Medical):   Marland Kitchen Lack of Transportation (Non-Medical):   Physical Activity:   . Days of Exercise per Week:   . Minutes of Exercise per Session:   Stress:   . Feeling of Stress :   Social Connections:   . Frequency of Communication with Friends and Family:   . Frequency of Social Gatherings with Friends and Family:   . Attends Religious Services:   . Active Member of Clubs or Organizations:   . Attends Archivist Meetings:   Marland Kitchen Marital Status:   Intimate Partner Violence:   . Fear of Current or Ex-Partner:   . Emotionally Abused:   Marland Kitchen Physically Abused:   . Sexually Abused:    Family History  Family history unknown: Yes      VITAL SIGNS BP (!) 158/90   Pulse 64   Temp (!)  97.4 F (36.3 C) (Oral)   Resp 20   Ht 5\' 5"  (1.651 m)   Wt 132 lb 3.2 oz (60 kg)   SpO2 97%   BMI 22.00 kg/m   Outpatient Encounter Medications as of 03/04/2020  Medication Sig  . acetaminophen (TYLENOL) 325 MG tablet Take 2 tablets (650 mg total) by mouth every 6 (six) hours as needed for mild pain (or Fever >/= 101).  Marland Kitchen amiodarone (PACERONE) 200 MG tablet Take 200 mg by mouth 2 (two) times daily.  Derrill Memo ON 03/05/2020] amiodarone (PACERONE) 200 MG tablet Take 200 mg by mouth daily.  Marland Kitchen amLODipine (NORVASC) 5 MG tablet Take 5 mg by mouth daily.  Marland Kitchen anastrozole (ARIMIDEX) 1 MG tablet Take 1 mg by mouth daily.  Marland Kitchen apixaban (ELIQUIS) 5 MG TABS tablet Take 5 mg by mouth 2 (two) times daily.  Roseanne Kaufman [place]-Castor Oil [facility]) OINT Special Instructions: Apply to sacrum, bilateral buttocks and coccyx qshift for prevention. Every Shift Day, Evening,  Night  . donepezil (ARICEPT) 5 MG tablet Take 5 mg by mouth at bedtime.  . metoprolol tartrate (LOPRESSOR) 25 MG tablet Take 0.5 tablets (12.5 mg total) by mouth 2 (two) times daily.  . Multiple Vitamin (MULTIVITAMIN ADULT PO) Take 1 tablet by mouth daily.  . NON FORMULARY Diet Change: Regular with nectar thick liquids  . OXYGEN Inhale 2 L into the lungs continuous.  . polyethylene glycol (MIRALAX / GLYCOLAX) 17 g packet Take 17 g by mouth daily as needed for mild constipation.  . risperiDONE (RISPERDAL) 0.5 MG tablet Take 1 tablet (0.5 mg total) by mouth at bedtime.  . simvastatin (ZOCOR) 40 MG tablet Take 20 mg by mouth every evening.   . sitaGLIPtin (JANUVIA) 25 MG tablet Take 25 mg by mouth daily.   No facility-administered encounter medications on file as of 03/04/2020.     SIGNIFICANT DIAGNOSTIC EXAMS   PREVIOUS  02-05-20: chest x-ray: Worsening patchy bilateral airspace disease compatible with multifocal pneumonia. Small right effusion.  02-06-20: ct of head:  1. Interval development of extensive bilateral upper lobe airspace opacities, right greater than left. Findings are concerning for multifocal pneumonia. 2. New small right pleural effusion and moderate left pleural effusion with overlying areas of compressive type atelectasis. 3. Aortic atherosclerosis, in addition to RCA and LAD coronary artery disease. Please note that although the presence of coronary artery calcium documents the presence of coronary artery disease, the severity of this disease and any potential stenosis cannot be assessed on this non-gated CT examination. Assessment for potential risk factor modification, dietary therapy or pharmacologic therapy may be warranted, if clinically indicated. Aortic Atherosclerosis   02-13-20: chest x-ray:  1. Multifocal bilateral pulmonary infiltrates and small bilateral pleural effusions again noted. Similar findings noted on prior exam. 2.  Stable cardiomegaly.  02-13-20:  swallow study: Recommend D3/mech soft and NTL   TODAY;  03-04-20: chest x-ray:  1. Cardiomegaly.  2. Right pleural effusion.   LABS REVIEWED PREVIOUS  12-16-19: hgb  a1c 6.8  02-05-20: wbc 8.5; hgb 9.8; hct 32.4; mcv 94.7plt 344 glucose 285; bun 20; creat 0.70; k+ 3.0; na++ 143; ca 8.3 mag 2.2 blood culture: no growth 02-06-20: vit B 12: 725; folate 6.2; ammonia 38; tsh 1.503; free t4: 1.24 02-08-20: wbc 7.6; hgb 10.1; hct 34.4; mcv 98.6 plt 299; glucose 113; bun 12; creat 0.64; k+ 3.2; na++ 146; ca 7.9  02-10-20: glucose 118; bun 19; creat 0.70; k+ 3.1; na++ 143; ca 7.9 liver normal albumin  2.1 02-14-20: wbc 5.8; hgb 9.0; hct 29.7; mcv 94.9 plt 245; glucose 125; bun 27; creat 0.80; k+ 3.9; na++ 142; ca 8.5   TODAY  02-29-20: glucose 121; bun 51; creat 1.75; k+ 4.5; na++ 137 ca 8.6   03-01-20: chol 123 LDL 56 trig 40 hdl 59 03-03-20: glucose 246; bun 42; creat 1.07 ;k+ 4.4; na++ 137; ca 9.1   Review of Systems  Unable to perform ROS: Dementia (unable to participate )   Physical Exam Constitutional:      General: She is not in acute distress.    Appearance: She is well-developed. She is not diaphoretic.  Neck:     Thyroid: No thyromegaly.  Cardiovascular:     Rate and Rhythm: Normal rate and regular rhythm.     Pulses: Normal pulses.     Heart sounds: Normal heart sounds.  Pulmonary:     Effort: Pulmonary effort is normal. No respiratory distress.     Breath sounds: Wheezing present.     Comments: 02 Abdominal:     General: Bowel sounds are normal. There is no distension.     Palpations: Abdomen is soft.     Tenderness: There is no abdominal tenderness.  Musculoskeletal:        General: Normal range of motion.     Cervical back: Neck supple.     Right lower leg: No edema.     Left lower leg: No edema.  Lymphadenopathy:     Cervical: No cervical adenopathy.  Skin:    General: Skin is warm and dry.  Neurological:     Mental Status: She is alert. Mental status is at baseline.    Psychiatric:        Mood and Affect: Mood normal.       ASSESSMENT/ PLAN:  TODAY  1. Right pleural effusion 2. Chronic diastolic congestive heart failure 3. AKI (acute kidney injury) 4. Chronic respiratory failure with hypoxia  Will begin daily weights Will begin lasix 40 mg daily through 03-11-20 Will begin duoneb every 6 hours Will check BMP 03-07-20.   MD is aware of resident's narcotic use and is in agreement with current plan of care. We will attempt to wean resident as appropriate.  Ok Edwards NP [facility]  Contact [phone] Monday through Friday 8am- 5pm  After hours call [phone]

## 2020-03-07 ENCOUNTER — Non-Acute Institutional Stay (SKILLED_NURSING_FACILITY): Payer: Medicare Other | Admitting: Adult Health

## 2020-03-07 ENCOUNTER — Other Ambulatory Visit (HOSPITAL_COMMUNITY)
Admission: RE | Admit: 2020-03-07 | Discharge: 2020-03-07 | Disposition: A | Discharge status: Home / Self Care | Payer: Medicare Other | Source: Skilled Nursing Facility | Attending: Adult Health | Admitting: Adult Health

## 2020-03-07 ENCOUNTER — Encounter: Payer: Self-pay | Admitting: Adult Health

## 2020-03-07 DIAGNOSIS — E119 Type 2 diabetes mellitus without complications: Secondary | ICD-10-CM | POA: Diagnosis not present

## 2020-03-07 DIAGNOSIS — Z794 Long term (current) use of insulin: Secondary | ICD-10-CM

## 2020-03-07 DIAGNOSIS — I5032 Chronic diastolic (congestive) heart failure: Secondary | ICD-10-CM | POA: Diagnosis not present

## 2020-03-07 DIAGNOSIS — I110 Hypertensive heart disease with heart failure: Secondary | ICD-10-CM | POA: Insufficient documentation

## 2020-03-07 DIAGNOSIS — J9611 Chronic respiratory failure with hypoxia: Secondary | ICD-10-CM

## 2020-03-07 LAB — BASIC METABOLIC PANEL
Anion gap: 14 (ref 5–15)
BUN: 12 mg/dL (ref 8–23)
CO2: 28 mmol/L (ref 22–32)
Calcium: 8.8 mg/dL — ABNORMAL LOW (ref 8.9–10.3)
Chloride: 97 mmol/L — ABNORMAL LOW (ref 98–111)
Creatinine, Ser: 0.72 mg/dL (ref 0.44–1.00)
GFR calc Af Amer: >60 mL/min (ref >60)
GFR calc non Af Amer: >60 mL/min (ref >60)
Glucose, Bld: 158 mg/dL — ABNORMAL HIGH (ref 70–99)
Potassium: 3.5 mmol/L (ref 3.5–5.1)
Sodium: 139 mmol/L (ref 135–145)

## 2020-03-07 NOTE — Progress Notes (Signed)
Location:    [place] Room Number: 149/W Place of Service:  SNF (31)   CODE STATUS: DNR  Allergies  Allergen Reactions  . Codeine   . Zantac [Ranitidine Hcl]     Chief Complaint  Patient presents with  . Short Term Rehab and care plan meeting.             HPI:  She is a 79 year old short tem rebab patient being seen for the management of her chronic illnesses. BIMS unable; mood 0/30. Family present for care plan meeting.  No reports of uncontrolled pain. No reports of agitation. She has had one fall without injury . She requires assist with meals to keep her from getting distracted. She is incontinent of bladder and bowel. She requires maximum assist with her adls. She continues to thickened liquids; which she does not like. She continues to be followed for her chronic illnesses including: Chronic respiratory failure with hypoxia  Hypertensive heart disease with chronic diastolic congestive heart failure  Controlled type 2 diabetes mellitus without complication with long term current insulin use     Past Medical History:  Diagnosis Date  . Abnormal uterine and vaginal bleeding, unspecified   . Allergic rhinitis due to pollen   . Anxiety   . Asthma   . Cancer [facility])    breast cancer - right  . Dementia ([place])   . Depression   . Diabetes mellitus without complication ([place])   . Essential (primary) hypertension   . Hyperlipidemia, unspecified   . Hypertension   . Major depressive disorder, single episode, unspecified   . Obesity, unspecified   . Pain in joint involving shoulder region   . Reflux esophagitis   . Trigger finger, acquired   . Type 2 diabetes mellitus with hyperglycemia ([place])   . Unspecified asthma with (acute) exacerbation   . 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 ([place])   . Unspecified osteoarthritis, unspecified site     Past Surgical History:  Procedure Laterality Date  . CHOLECYSTECTOMY    . MASTECTOMY Right   .  RE-EXCISION OF BREAST LUMPECTOMY      Social History   Socioeconomic History  . Marital status: Married    Spouse name: Not on file  . Number of children: Not on file  . Years of education: Not on file  . Highest education level: Not on file  Occupational History  . Not on file  Tobacco Use  . Smoking status: Never Smoker  . Smokeless tobacco: Never Used  Substance and Sexual Activity  . Alcohol use: No  . Drug use: No  . Sexual activity: Not on file  Other Topics Concern  . Not on file  Social History Narrative  . Not on file   Social Determinants of Health   Financial Resource Strain:   . Difficulty of Paying Living Expenses:   Food Insecurity:   . Worried About Charity fundraiser in the Last Year:   . Arboriculturist in the Last Year:   Transportation Needs:   . Film/video editor (Medical):   Marland Kitchen Lack of Transportation (Non-Medical):   Physical Activity:   . Days of Exercise per Week:   . Minutes of Exercise per Session:   Stress:   . Feeling of Stress :   Social Connections:   . Frequency of Communication with Friends and Family:   . Frequency of Social Gatherings with Friends and Family:   . Attends Religious  Services:   . Active Member of Clubs or Organizations:   . Attends Archivist Meetings:   Marland Kitchen Marital Status:   Intimate Partner Violence:   . Fear of Current or Ex-Partner:   . Emotionally Abused:   Marland Kitchen Physically Abused:   . Sexually Abused:    Family History  Family history unknown: Yes      VITAL SIGNS BP (!) 173/68   Pulse 64   Temp 98 F (36.7 C) (Oral)   Resp 20   Ht 5\' 5"  (1.651 m)   Wt 132 lb (59.9 kg)   SpO2 93%   BMI 21.97 kg/m   Outpatient Encounter Medications as of 03/07/2020  Medication Sig  . acetaminophen (TYLENOL) 325 MG tablet Take 2 tablets (650 mg total) by mouth every 6 (six) hours as needed for mild pain (or Fever >/= 101).  Marland Kitchen amiodarone (PACERONE) 200 MG tablet Take 200 mg by mouth daily.  Marland Kitchen  amLODipine (NORVASC) 5 MG tablet Take 5 mg by mouth daily.  Marland Kitchen anastrozole (ARIMIDEX) 1 MG tablet Take 1 mg by mouth daily.  Marland Kitchen apixaban (ELIQUIS) 5 MG TABS tablet Take 5 mg by mouth 2 (two) times daily.  Roseanne Kaufman [place]-Castor Oil [facility]) OINT Special Instructions: Apply to sacrum, bilateral buttocks and coccyx qshift for prevention. Every Shift Day, Evening, Night  . donepezil (ARICEPT) 5 MG tablet Take 5 mg by mouth at bedtime.  . furosemide (LASIX) 20 MG tablet Take 40 mg by mouth daily.  Marland Kitchen ipratropium-albuterol (DUONEB) 0.5-2.5 (3) MG/3ML SOLN Take 3 mLs by nebulization every 6 (six) hours.  . metoprolol tartrate (LOPRESSOR) 25 MG tablet Take 0.5 tablets (12.5 mg total) by mouth 2 (two) times daily.  . Multiple Vitamin (MULTIVITAMIN ADULT PO) Take 1 tablet by mouth daily.  . NON FORMULARY Diet Change: Regular with nectar thick liquids  . OXYGEN Inhale 3 L into the lungs continuous.   . polyethylene glycol (MIRALAX / GLYCOLAX) 17 g packet Take 17 g by mouth daily as needed for mild constipation.  . risperiDONE (RISPERDAL) 0.5 MG tablet Take 1 tablet (0.5 mg total) by mouth at bedtime.  . simvastatin (ZOCOR) 40 MG tablet Take 20 mg by mouth every evening.   . sitaGLIPtin (JANUVIA) 25 MG tablet Take 25 mg by mouth daily.  . [DISCONTINUED] amiodarone (PACERONE) 200 MG tablet Take 200 mg by mouth 2 (two) times daily.   No facility-administered encounter medications on file as of 03/07/2020.     SIGNIFICANT DIAGNOSTIC EXAMS   PREVIOUS  02-05-20: chest x-ray: Worsening patchy bilateral airspace disease compatible with multifocal pneumonia. Small right effusion.  02-06-20: ct of head:  1. Interval development of extensive bilateral upper lobe airspace opacities, right greater than left. Findings are concerning for multifocal pneumonia. 2. New small right pleural effusion and moderate left pleural effusion with overlying areas of compressive type atelectasis. 3. Aortic atherosclerosis, in  addition to RCA and LAD coronary artery disease. Please note that although the presence of coronary artery calcium documents the presence of coronary artery disease, the severity of this disease and any potential stenosis cannot be assessed on this non-gated CT examination. Assessment for potential risk factor modification, dietary therapy or pharmacologic therapy may be warranted, if clinically indicated. Aortic Atherosclerosis   02-13-20: chest x-ray:  1. Multifocal bilateral pulmonary infiltrates and small bilateral pleural effusions again noted. Similar findings noted on prior exam. 2.  Stable cardiomegaly.  02-13-20: swallow study: Recommend D3/mech soft and NTL   03-04-20: chest  x-ray:  1. Cardiomegaly.  2. Right pleural effusion.   NO NEW EXAMS.    LABS REVIEWED PREVIOUS  12-16-19: hgb  a1c 6.8  02-05-20: wbc 8.5; hgb 9.8; hct 32.4; mcv 94.7plt 344 glucose 285; bun 20; creat 0.70; k+ 3.0; na++ 143; ca 8.3 mag 2.2 blood culture: no growth 02-06-20: vit B 12: 725; folate 6.2; ammonia 38; tsh 1.503; free t4: 1.24 02-08-20: wbc 7.6; hgb 10.1; hct 34.4; mcv 98.6 plt 299; glucose 113; bun 12; creat 0.64; k+ 3.2; na++ 146; ca 7.9  02-10-20: glucose 118; bun 19; creat 0.70; k+ 3.1; na++ 143; ca 7.9 liver normal albumin 2.1 02-14-20: wbc 5.8; hgb 9.0; hct 29.7; mcv 94.9 plt 245; glucose 125; bun 27; creat 0.80; k+ 3.9; na++ 142; ca 8.5  02-29-20: glucose 121; bun 51; creat 1.75; k+ 4.5; na++ 137 ca 8.6   03-01-20: chol 123 LDL 56 trig 40 hdl 59 03-03-20: glucose 246; bun 42; creat 1.07 ;k+ 4.4; na++ 137; ca 9.1   TODAY  03-07-20: glucose 158; bun 12; creat 0.72; k+ 35; na++ 139; ca 8.8    Review of Systems  Unable to perform ROS: Dementia (unable to participate )   Physical Exam Constitutional:      General: She is not in acute distress.    Appearance: She is well-developed. She is not diaphoretic.  Neck:     Thyroid: No thyromegaly.  Cardiovascular:     Rate and Rhythm: Normal rate and  regular rhythm.     Pulses: Normal pulses.     Heart sounds: Normal heart sounds.  Pulmonary:     Effort: Pulmonary effort is normal. No respiratory distress.     Breath sounds: Normal breath sounds.     Comments: 02 Abdominal:     General: Bowel sounds are normal. There is no distension.     Palpations: Abdomen is soft.     Tenderness: There is no abdominal tenderness.  Musculoskeletal:        General: Normal range of motion.     Cervical back: Neck supple.     Right lower leg: No edema.     Left lower leg: No edema.  Lymphadenopathy:     Cervical: No cervical adenopathy.  Skin:    General: Skin is warm and dry.  Neurological:     Mental Status: She is alert. Mental status is at baseline.  Psychiatric:        Mood and Affect: Mood normal.        ASSESSMENT/ PLAN:  TODAY  1. Chronic respiratory failure with hypoxia 2. Hypertensive heart disease with chronic diastolic congestive heart failure 3. Controlled type 2 diabetes mellitus without complication with long term current insulin use   Will continue therapy as directed  Will continue current medications Will continue current plan of care Will continue to monitor her status.  At this time the goal is for some sort of long term placement.    MD is aware of resident's narcotic use and is in agreement with current plan of care. We will attempt to wean resident as appropriate.  Ok Edwards NP [facility]  Contact [phone] Monday through Friday 8am- 5pm  After hours call [phone]

## 2020-03-09 DIAGNOSIS — J90 Pleural effusion, not elsewhere classified: Secondary | ICD-10-CM | POA: Insufficient documentation

## 2020-03-09 DIAGNOSIS — J9611 Chronic respiratory failure with hypoxia: Secondary | ICD-10-CM | POA: Insufficient documentation

## 2020-03-12 ENCOUNTER — Other Ambulatory Visit: Payer: Self-pay | Admitting: *Deleted

## 2020-03-12 NOTE — Patient Outreach (Signed)
Screened for potential [facility] Care Management needs as a benefit of  [employer].  Amy Hoffman is currently receiving skilled therapy at [facility].  Writer attended telephonic interdisciplinary team meeting to assess for disposition needs and transition plan for resident.   Facility reports member will transition to long term care at facility instead of returning home.   Will continue to follow.   Marthenia Rolling, MSN-Ed, RN,BSN [place] Acute Care Coordinator [phone] [facility]) [phone]  (Toll free office)

## 2020-03-14 DIAGNOSIS — R0602 Shortness of breath: Secondary | ICD-10-CM | POA: Diagnosis not present

## 2020-03-14 DIAGNOSIS — K5909 Other constipation: Secondary | ICD-10-CM | POA: Diagnosis not present

## 2020-03-14 DIAGNOSIS — F039 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: Secondary | ICD-10-CM | POA: Diagnosis not present

## 2020-03-14 DIAGNOSIS — F0391 Unspecified dementia with behavioral disturbance: Secondary | ICD-10-CM | POA: Diagnosis not present

## 2020-03-14 DIAGNOSIS — R5383 Other fatigue: Secondary | ICD-10-CM | POA: Diagnosis not present

## 2020-03-14 DIAGNOSIS — G9341 Metabolic encephalopathy: Secondary | ICD-10-CM | POA: Diagnosis not present

## 2020-03-14 DIAGNOSIS — E785 Hyperlipidemia, unspecified: Secondary | ICD-10-CM | POA: Diagnosis not present

## 2020-03-14 DIAGNOSIS — I5032 Chronic diastolic (congestive) heart failure: Secondary | ICD-10-CM | POA: Diagnosis not present

## 2020-03-14 DIAGNOSIS — N1832 Chronic kidney disease, stage 3b: Secondary | ICD-10-CM | POA: Diagnosis not present

## 2020-03-14 DIAGNOSIS — R1312 Dysphagia, oropharyngeal phase: Secondary | ICD-10-CM | POA: Diagnosis not present

## 2020-03-14 DIAGNOSIS — R262 Difficulty in walking, not elsewhere classified: Secondary | ICD-10-CM | POA: Diagnosis not present

## 2020-03-14 DIAGNOSIS — F321 Major depressive disorder, single episode, moderate: Secondary | ICD-10-CM | POA: Diagnosis not present

## 2020-03-14 DIAGNOSIS — R296 Repeated falls: Secondary | ICD-10-CM | POA: Diagnosis not present

## 2020-03-14 DIAGNOSIS — I110 Hypertensive heart disease with heart failure: Secondary | ICD-10-CM | POA: Diagnosis not present

## 2020-03-14 DIAGNOSIS — R2232 Localized swelling, mass and lump, left upper limb: Secondary | ICD-10-CM | POA: Diagnosis not present

## 2020-03-14 DIAGNOSIS — J45901 Unspecified asthma with (acute) exacerbation: Secondary | ICD-10-CM | POA: Diagnosis not present

## 2020-03-14 DIAGNOSIS — R1313 Dysphagia, pharyngeal phase: Secondary | ICD-10-CM | POA: Diagnosis not present

## 2020-03-14 DIAGNOSIS — F329 Major depressive disorder, single episode, unspecified: Secondary | ICD-10-CM | POA: Diagnosis not present

## 2020-03-14 DIAGNOSIS — F482 Pseudobulbar affect: Secondary | ICD-10-CM | POA: Diagnosis not present

## 2020-03-14 DIAGNOSIS — C50911 Malignant neoplasm of unspecified site of right female breast: Secondary | ICD-10-CM | POA: Diagnosis not present

## 2020-03-14 DIAGNOSIS — I4811 Longstanding persistent atrial fibrillation: Secondary | ICD-10-CM | POA: Diagnosis not present

## 2020-03-14 DIAGNOSIS — M6281 Muscle weakness (generalized): Secondary | ICD-10-CM | POA: Diagnosis not present

## 2020-03-14 DIAGNOSIS — J690 Pneumonitis due to inhalation of food and vomit: Secondary | ICD-10-CM | POA: Diagnosis not present

## 2020-03-14 DIAGNOSIS — Z741 Need for assistance with personal care: Secondary | ICD-10-CM | POA: Diagnosis not present

## 2020-03-14 DIAGNOSIS — I480 Paroxysmal atrial fibrillation: Secondary | ICD-10-CM | POA: Diagnosis not present

## 2020-03-14 DIAGNOSIS — E1169 Type 2 diabetes mellitus with other specified complication: Secondary | ICD-10-CM | POA: Diagnosis not present

## 2020-03-14 DIAGNOSIS — E119 Type 2 diabetes mellitus without complications: Secondary | ICD-10-CM | POA: Diagnosis not present

## 2020-03-14 DIAGNOSIS — I5031 Acute diastolic (congestive) heart failure: Secondary | ICD-10-CM | POA: Diagnosis not present

## 2020-03-14 DIAGNOSIS — Z0000 Encounter for general adult medical examination without abnormal findings: Secondary | ICD-10-CM | POA: Diagnosis not present

## 2020-03-14 DIAGNOSIS — I10 Essential (primary) hypertension: Secondary | ICD-10-CM | POA: Diagnosis not present

## 2020-03-14 DIAGNOSIS — J9601 Acute respiratory failure with hypoxia: Secondary | ICD-10-CM | POA: Diagnosis not present

## 2020-03-14 DIAGNOSIS — E1159 Type 2 diabetes mellitus with other circulatory complications: Secondary | ICD-10-CM | POA: Diagnosis not present

## 2020-03-14 DIAGNOSIS — Z23 Encounter for immunization: Secondary | ICD-10-CM | POA: Diagnosis not present

## 2020-03-14 NOTE — H&P (Signed)
[facility] History & Physical  Amy Splinter MD  Amy Hoffman 02/05/2020  Chief Complaint: AMS/ drowsy, ^^CBG/ SOB HPI: The patient is a 79 y.o. year-old w/ hx of NIDDM, dementia, HTN, HL, depression, asthma who presents w/ SOB and some dec'd MS/ drowsiness. In ED SpO2 very low in the 60's on RA.  Improved on nasal flow O2.  CXR shows bilat infiltrates. Given IV abx for suspected PNA, asked to see for admission.   Pt was admitted here in April for SOB and hypoxemia w/ resp distress. CXR showed bilat infiltrates, pt admitted and treated as PNA/ asthma. Has afib / RVR rx'd w/ po amiodarone. Palliative care saw pt and she was made a DNR. Perhaps pt went to SNF at dc.   Pt is vague historian.  No c/o at this time.  SpO2 is 96% on nasal O2 at 5 lpm.  Denies any CP, prod cough , fevers or chills.    ROS  denies CP  no joint pain   no HA  no blurry vision  no rash  no diarrhea  no nausea/ vomiting  no dysuria  no difficulty voiding  no change in urine color   Past Medical History  Past Medical History:  Diagnosis Date  . Abnormal uterine and vaginal bleeding, unspecified   . Allergic rhinitis due to pollen   . Anxiety   . Asthma   . Cancer [facility])    breast cancer - right  . Dementia ([place])   . Depression   . Diabetes mellitus without complication ([place])   . Essential (primary) hypertension   . Hyperlipidemia, unspecified   . Hypertension   . Major depressive disorder, single episode, unspecified   . Obesity, unspecified   . Pain in joint involving shoulder region   . Reflux esophagitis   . Trigger finger, acquired   . Type 2 diabetes mellitus with hyperglycemia ([place])   . Unspecified asthma with (acute) exacerbation   . 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 ([place])   . Unspecified osteoarthritis, unspecified site    Past Surgical History  Past Surgical History:  Procedure Laterality Date  . CHOLECYSTECTOMY    . MASTECTOMY Right   .  RE-EXCISION OF BREAST LUMPECTOMY     Family History  Family History  Family history unknown: Yes   Social History  reports that she has never smoked. She has never used smokeless tobacco. She reports that she does not drink alcohol and does not use drugs. Allergies  Allergies  Allergen Reactions  . Codeine   . Zantac [Ranitidine Hcl]    Home medications Prior to Admission medications   Medication Sig Start Date End Date Taking? Authorizing Provider  amLODipine (NORVASC) 5 MG tablet Take 5 mg by mouth daily.   Yes [provider]  anastrozole (ARIMIDEX) 1 MG tablet Take 1 mg by mouth daily.   Yes [provider]  donepezil (ARICEPT) 5 MG tablet Take 5 mg by mouth at bedtime.   Yes [provider]  Multiple Vitamin (MULTIVITAMIN ADULT PO) Take 1 tablet by mouth daily.   Yes [provider]  simvastatin (ZOCOR) 40 MG tablet Take 20 mg by mouth every evening.    Yes [provider]  acetaminophen (TYLENOL) 325 MG tablet Take 2 tablets (650 mg total) by mouth every 6 (six) hours as needed for mild pain (or Fever >/= 101). 02/14/20   Roxan Hockey, MD  amiodarone (PACERONE) 200 MG tablet Take 200 mg by mouth daily.  03/05/20   [provider]  apixaban (ELIQUIS) 5 MG TABS tablet Take 5 mg by mouth 2 (two) times daily. 02/14/20   [provider]  Janne [employer] Oil [facility]) OINT Special Instructions: Apply to sacrum, bilateral buttocks and coccyx qshift for prevention. Every Shift Day, Evening, Night 03/01/20   [provider]  furosemide (LASIX) 20 MG tablet Take 40 mg by mouth daily. 03/04/20   [provider]  ipratropium-albuterol (DUONEB) 0.5-2.5 (3) MG/3ML SOLN Take 3 mLs by nebulization every 6 (six) hours. 03/04/20   [provider]  metoprolol tartrate (LOPRESSOR) 25 MG tablet Take 0.5 tablets (12.5 mg total) by mouth 2 (two) times daily. 02/14/20   Roxan Hockey, MD  NON FORMULARY Diet Change:  Regular with nectar thick liquids 02/26/20   [provider]  OXYGEN Inhale 3 L into the lungs continuous.  03/04/20   [provider]  polyethylene glycol (MIRALAX / GLYCOLAX) 17 g packet Take 17 g by mouth daily as needed for mild constipation. 02/14/20   Roxan Hockey, MD  risperiDONE (RISPERDAL) 0.5 MG tablet Take 1 tablet (0.5 mg total) by mouth at bedtime. 02/14/20   Roxan Hockey, MD  sitaGLIPtin (JANUVIA) 25 MG tablet Take 25 mg by mouth daily. 02/27/20   [provider]       Exam Gen elderly pleasant WF, slight ^wob but mostly comfortable Ox "hospital", "9 oclock", "ms [place]" No rash, cyanosis or gangrene Sclera anicteric, throat clear No jvd or bruits Chest coarse bilat rales 1/3 up, no bronch BS RRR no MRG Abd soft ntnd no mass or ascites +bs GU purewick in place draining dark amber urine small amts MS no joint effusions or deformity Ext diffuse 2+ LE pitting edema from ankles to hips,  No wounds or ulcers  Neuro is alert, gen weak, pleasant , as above, nonfocal    Home meds:  - amiodarone 200/ norvasc 5/ eliquis 5 bid/ zocor 20 hs  - risperdal 0.5 bid/ aricept 5 hs  - anastrozole 1 qd  - amaryl 8 mg qd     Assessment/ Plan: 1. SOB/ hypoxemia / bilat pulm infiltrates - suspected PNA at first but CXR could also be c/w CHF.  Getting IV vanc/ cefepime. Was here in April w/ similar problems rx'd as PNA/ asthma. Her exam shows diffuse vol overload in the LE's, no hx of CHF in chart. Will treat her for vol overload w/ IV lasix 20 bid as well.  No fever/ ^WBC. May be able to dc IV abx if she responds well to diuresis.  2. Dementia - cont meds 3. EOL - was made DNR last admit, could not reach family to confirm this order so left full code at this time.  4. HTN - cont home meds 5. Atrial fib - cont eliquis/ amio 6. DM 2 - SSI here 7. Hypokalemia - replete 8. Anemia - Hb 9.2, follow      Amy Splinter  MD 02/05/2020,  9:14 PM

## 2020-03-19 ENCOUNTER — Other Ambulatory Visit: Payer: Self-pay | Admitting: *Deleted

## 2020-03-19 NOTE — Patient Outreach (Signed)
Screened for potential [facility] Care Management needs as a benefit of  [employer].  Writer attended [facility] telephonic interdisciplinary team meeting to assess for disposition needs and transition plan for resident.   Facility staff reports member has transitioned to private pay at [facility].  No identifiable [facility] Care Management needs at this time.   Marthenia Rolling, MSN-Ed, RN,BSN [place] [facility] Care Coordinator [phone] [facility]) [phone]  (Toll free office)

## 2020-03-20 ENCOUNTER — Non-Acute Institutional Stay (SKILLED_NURSING_FACILITY): Payer: Medicare Other | Admitting: Adult Health

## 2020-03-20 ENCOUNTER — Encounter: Payer: Self-pay | Admitting: Adult Health

## 2020-03-20 DIAGNOSIS — F039 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: Secondary | ICD-10-CM | POA: Diagnosis not present

## 2020-03-20 DIAGNOSIS — C50911 Malignant neoplasm of unspecified site of right female breast: Secondary | ICD-10-CM | POA: Diagnosis not present

## 2020-03-20 DIAGNOSIS — E1169 Type 2 diabetes mellitus with other specified complication: Secondary | ICD-10-CM

## 2020-03-20 DIAGNOSIS — E785 Hyperlipidemia, unspecified: Secondary | ICD-10-CM

## 2020-03-20 NOTE — Progress Notes (Signed)
Location:    [place] Room Number: 149/W Place of Service:  SNF (31)   CODE STATUS: DNR  Allergies  Allergen Reactions  . Codeine   . Zantac [Ranitidine Hcl]     Chief Complaint  Patient presents with  . 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 There are no reports of agitation present; no reports of uncontrolled pain; no reports of constipation or diarrhea.   Past Medical History:  Diagnosis Date  . Abnormal uterine and vaginal bleeding, unspecified   . Allergic rhinitis due to pollen   . Anxiety   . Asthma   . Cancer [facility])    breast cancer - right  . Dementia ([place])   . Depression   . Diabetes mellitus without complication ([place])   . Essential (primary) hypertension   . Hyperlipidemia, unspecified   . Hypertension   . Major depressive disorder, single episode, unspecified   . Obesity, unspecified   . Pain in joint involving shoulder region   . Reflux esophagitis   . Trigger finger, acquired   . Type 2 diabetes mellitus with hyperglycemia ([place])   . Unspecified asthma with (acute) exacerbation   . 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 ([place])   . Unspecified osteoarthritis, unspecified site     Past Surgical History:  Procedure Laterality Date  . CHOLECYSTECTOMY    . MASTECTOMY Right   . RE-EXCISION OF BREAST LUMPECTOMY      Social History   Socioeconomic History  . Marital status: Married    Spouse name: Not on file  . Number of children: Not on file  . Years of education: Not on file  . Highest education level: Not on file  Occupational History  . Not  on file  Tobacco Use  . Smoking status: Never Smoker  . Smokeless tobacco: Never Used  Substance and Sexual Activity  . Alcohol use: No  . Drug use: No  . Sexual activity: Not on file  Other Topics Concern  . Not on file  Social History Narrative  . Not on file   Social Determinants of Health   Financial Resource Strain:   . Difficulty of Paying Living Expenses:   Food Insecurity:   . Worried About Charity fundraiser in the Last Year:   . Arboriculturist in the Last Year:   Transportation Needs:   . Film/video editor (Medical):   Marland Kitchen Lack of Transportation (Non-Medical):   Physical Activity:   . Days of Exercise per Week:   . Minutes of Exercise per Session:   Stress:   . Feeling of Stress :   Social Connections:   . Frequency of Communication with Friends and Family:   . Frequency of Social Gatherings with Friends and Family:   . Attends Religious Services:   . Active Member of Clubs or Organizations:   . Attends Archivist Meetings:   Marland Kitchen Marital Status:   Intimate Partner Violence:   . Fear of Current or Ex-Partner:   . Emotionally Abused:   Marland Kitchen Physically Abused:   . Sexually Abused:    Family History  Family history unknown: Yes      VITAL SIGNS BP 138/74   Pulse (!) 53   Temp 98.1 F (36.7 C) (Oral)   Resp 20   Ht 5\' 5"  (1.651 m)   Wt 130 lb 3.2 oz (59.1 kg)   SpO2 93%   BMI 21.67 kg/m   Outpatient Encounter Medications as of 03/20/2020  Medication Sig  . acetaminophen (TYLENOL) 325 MG tablet Take 2 tablets (650 mg total) by mouth every 6 (six) hours as needed for mild pain (or Fever >/= 101).  Marland Kitchen amiodarone (PACERONE) 200 MG tablet Take 200 mg by mouth daily.  Marland Kitchen amLODipine (NORVASC) 5 MG tablet Take 5 mg by mouth daily.  Marland Kitchen anastrozole (ARIMIDEX) 1 MG tablet Take 1 mg by mouth daily.  Marland Kitchen apixaban (ELIQUIS) 5 MG TABS tablet Take 5 mg by mouth 2 (two) times daily.  Roseanne Kaufman [place] [facility]) OINT Special Instructions: Apply to  sacrum, bilateral buttocks and coccyx qshift for prevention. Every Shift Day, Evening, Night  . donepezil (ARICEPT) 5 MG tablet Take 5 mg by mouth at bedtime.  Marland Kitchen ipratropium-albuterol (DUONEB) 0.5-2.5 (3) MG/3ML SOLN Take 3 mLs by nebulization every 6 (six) hours.  . metoprolol tartrate (LOPRESSOR) 25 MG tablet Take 0.5 tablets (12.5 mg total) by mouth 2 (two) times daily.  . Multiple Vitamin (MULTIVITAMIN ADULT PO) Take 1 tablet by mouth daily.  . NON FORMULARY Diet Change: Regular with nectar thick liquids  . OXYGEN Inhale 3 L into the lungs continuous.   . polyethylene glycol (MIRALAX / GLYCOLAX) 17 g packet Take 17 g by mouth daily as needed for mild constipation.  . risperiDONE (RISPERDAL) 0.5 MG tablet Take 1 tablet (0.5 mg total) by mouth at bedtime.  . simvastatin (ZOCOR) 40 MG tablet Take 20 mg by mouth every evening.   . sitaGLIPtin (JANUVIA) 25 MG tablet Take 25 mg by mouth daily.  . [DISCONTINUED] furosemide (LASIX) 20 MG tablet Take 40 mg by mouth daily.   No facility-administered encounter medications on file as of 03/20/2020.     SIGNIFICANT DIAGNOSTIC EXAMS  PREVIOUS  02-05-20: chest x-ray: Worsening patchy bilateral airspace disease compatible with multifocal pneumonia. Small right effusion.  02-06-20: ct of head:  1. Interval development of extensive bilateral upper lobe airspace opacities, right greater than left. Findings are concerning for multifocal pneumonia. 2. New small right pleural effusion and moderate left pleural effusion with overlying areas of compressive type atelectasis. 3. Aortic atherosclerosis, in addition to RCA and LAD coronary artery disease. Please note that although the presence of coronary artery calcium documents the presence of coronary artery disease, the severity of this disease and any potential stenosis cannot be assessed on this non-gated CT examination. Assessment for potential risk factor modification, dietary therapy or pharmacologic  therapy may be warranted, if clinically indicated. Aortic Atherosclerosis   02-13-20: chest x-ray:  1. Multifocal bilateral pulmonary infiltrates and small bilateral pleural effusions again noted. Similar findings noted on prior exam. 2.  Stable cardiomegaly.  02-13-20: swallow study: Recommend D3/mech soft and NTL   03-04-20: chest x-ray:  1. Cardiomegaly.  2. Right pleural effusion.   NO NEW EXAMS.    LABS REVIEWED PREVIOUS  12-16-19: hgb  a1c 6.8  02-05-20: wbc 8.5; hgb 9.8; hct 32.4; mcv 94.7plt 344 glucose 285; bun 20; creat 0.70; k+ 3.0; na++ 143; ca 8.3 mag 2.2 blood culture: no growth 02-06-20: vit B 12: 725; folate 6.2; ammonia 38; tsh 1.503; free t4: 1.24 02-08-20: wbc 7.6;  hgb 10.1; hct 34.4; mcv 98.6 plt 299; glucose 113; bun 12; creat 0.64; k+ 3.2; na++ 146; ca 7.9  02-10-20: glucose 118; bun 19; creat 0.70; k+ 3.1; na++ 143; ca 7.9 liver normal albumin 2.1 02-14-20: wbc 5.8; hgb 9.0; hct 29.7; mcv 94.9 plt 245; glucose 125; bun 27; creat 0.80; k+ 3.9; na++ 142; ca 8.5  02-29-20: glucose 121; bun 51; creat 1.75; k+ 4.5; na++ 137 ca 8.6   03-01-20: chol 123 LDL 56 trig 40 hdl 59 03-03-20: glucose 246; bun 42; creat 1.07 ;k+ 4.4; na++ 137; ca 9.1  03-07-20: glucose 158; bun 12; creat 0.72; k+ 35; na++ 139; ca 8.8  NO NEW LABS.     Review of Systems  Unable to perform ROS: Dementia (unable to participate )     Physical Exam Constitutional:      General: She is not in acute distress.    Appearance: She is well-developed. She is not diaphoretic.  Neck:     Thyroid: No thyromegaly.  Cardiovascular:     Rate and Rhythm: Normal rate and regular rhythm.     Pulses: Normal pulses.     Heart sounds: Normal heart sounds.  Pulmonary:     Effort: Pulmonary effort is normal. No respiratory distress.     Breath sounds: Normal breath sounds.  Abdominal:     General: Bowel sounds are normal. There is no distension.     Palpations: Abdomen is soft.     Tenderness: There is no abdominal  tenderness.  Musculoskeletal:        General: Normal range of motion.     Cervical back: Neck supple.     Right lower leg: No edema.     Left lower leg: No edema.  Lymphadenopathy:     Cervical: No cervical adenopathy.  Skin:    General: Skin is warm and dry.  Neurological:     Mental Status: She is alert. Mental status is at baseline.  Psychiatric:        Mood and Affect: Mood normal.     ASSESSMENT/ PLAN:   TODAY  1. Malignant neoplasm of female breast unspecified estrogen receptor status unspecified site of breast: is stable will continue arimidex 1 mg daily  2. Hyperlipidemia associated with type 2 diabetes mellitus: is stable will continue zocor 20 mg daily   3. 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   PREVIOUS   4. Chronic constipation: is stable will continue miralax daily as needed   5. Acute diastolic CHF (congestive heart failure) is stable EF 50-55% (12-17-19) is compensated will continue lopressor 12.5 mg twice daily   6. Longstanding persistent atrial fibrillation: heart rate is stable will continue amiodarone  200 mg daily; lopressor 12.5 mg twice daily for rate control and eliquis 5 mg twice daily   7. Hypertensive heart disease with chronic diastolic congestive heart failure: is stable b/p 138/74 will continue norvasc 5 mg daily lopressor 12.5 mg twice daily   8. Oropharyngeal dysphagia: is stable no signs of aspiration present; will continue nectar thick liquids  9. Type 2 diabetes mellitus without complications without long term current use of insulin: is stable hgb a1c 6.8 will continue januvia 25 mg daily   10. Dementia without behavioral disturbance unspecified dementia type: is stable weight is 130 pounds will continue aricept 5 mg daily   MD is aware of resident's narcotic use and is in agreement with  current plan of care. We will attempt to wean  resident as appropriate.  Ok Edwards NP [facility]  Contact [phone] Monday through Friday 8am- 5pm  After hours call [phone]

## 2020-03-21 ENCOUNTER — Encounter: Payer: Self-pay | Admitting: Adult Health

## 2020-03-21 ENCOUNTER — Non-Acute Institutional Stay (SKILLED_NURSING_FACILITY): Payer: Medicare Other | Admitting: Adult Health

## 2020-03-21 DIAGNOSIS — E119 Type 2 diabetes mellitus without complications: Secondary | ICD-10-CM | POA: Diagnosis not present

## 2020-03-21 DIAGNOSIS — F0391 Unspecified dementia with behavioral disturbance: Secondary | ICD-10-CM

## 2020-03-21 DIAGNOSIS — F039 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: Secondary | ICD-10-CM | POA: Diagnosis not present

## 2020-03-21 NOTE — Progress Notes (Signed)
Location:    [facility] Room Number: 149/W Place of Service:  SNF (31)   CODE STATUS: DNR  Allergies  Allergen Reactions  . Codeine   . Zantac [Ranitidine Hcl]     Chief Complaint  Patient presents with  . Acute Visit    Care Plan Meeting    HPI:  We have come together for her care plan meeting. Family present. BIMS unable; mood 0/30. No falls. She is presently not on therapy case load. She is total care with her adls. She was self care prior to her hospitalization. We have reviewed her medication list with her family. She was not taking risperdal prior to her hospitalization per her family. At our last meeting they did not want her cbg's checked; now they would like for this to be done daily.  They want this medication stopped. Will wean her off the medication. They feel as though this medication is causing her to be lethargic during the day. They would like to be able to take her back home; if her status can improve. Since april of this year her status has declined. We have discussed her overall health status. 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 Abnormal uterine and vaginal bleeding, unspecified   . Allergic rhinitis due to pollen   . Anxiety   . Asthma   . Cancer [facility])    breast cancer - right  . Dementia ([place])   . Depression   . Diabetes mellitus without complication ([place])   . Essential (primary) hypertension   . Hyperlipidemia, unspecified   . Hypertension   . Major depressive disorder, single episode, unspecified   . Obesity, unspecified   . Pain in joint involving shoulder region   . Reflux esophagitis   . Trigger finger, acquired   . Type 2 diabetes mellitus  with hyperglycemia ([place])   . Unspecified asthma with (acute) exacerbation   . 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 ([place])   . Unspecified osteoarthritis, unspecified site     Past Surgical History:  Procedure Laterality Date  . CHOLECYSTECTOMY    . MASTECTOMY Right   . RE-EXCISION OF BREAST LUMPECTOMY      Social History   Socioeconomic History  . Marital status: Married    Spouse name: Not on file  . Number of children: Not on file  . Years of education: Not on file  . Highest education level: Not on file  Occupational History  . Not on file  Tobacco Use  . Smoking status: Never Smoker  . Smokeless tobacco: Never Used  Substance and Sexual Activity  . Alcohol use: No  . Drug use: No  . Sexual activity: Not on file  Other Topics Concern  . Not on file  Social History Narrative  . Not on file   Social Determinants of Health   Financial Resource Strain:   . Difficulty of Paying Living Expenses:   Food Insecurity:   . Worried About Charity fundraiser in the Last Year:   . Arboriculturist in the Last Year:   Transportation Needs:   . Film/video editor (Medical):   Marland Kitchen Lack of Transportation (Non-Medical):   Physical Activity:   . Days of Exercise per Week:   .  Minutes of Exercise per Session:   Stress:   . Feeling of Stress :   Social Connections:   . Frequency of Communication with Friends and Family:   . Frequency of Social Gatherings with Friends and Family:   . Attends Religious Services:   . Active Member of Clubs or Organizations:   . Attends Archivist Meetings:   Marland Kitchen Marital Status:   Intimate Partner Violence:   . Fear of Current or Ex-Partner:   . Emotionally Abused:   Marland Kitchen Physically Abused:   . Sexually Abused:    Family History  Family history unknown: Yes      VITAL SIGNS BP 138/74   Pulse 62   Ht 5\' 5"  (1.651 m)   Wt 130 lb 3.2 oz (59.1 kg)   SpO2 94%   BMI 21.67 kg/m   Outpatient Encounter Medications as  of 03/21/2020  Medication Sig  . acetaminophen (TYLENOL) 325 MG tablet Take 2 tablets (650 mg total) by mouth every 6 (six) hours as needed for mild pain (or Fever >/= 101).  Marland Kitchen amiodarone (PACERONE) 200 MG tablet Take 200 mg by mouth daily.  Marland Kitchen amLODipine (NORVASC) 5 MG tablet Take 5 mg by mouth daily.  Marland Kitchen anastrozole (ARIMIDEX) 1 MG tablet Take 1 mg by mouth daily.  Marland Kitchen apixaban (ELIQUIS) 5 MG TABS tablet Take 5 mg by mouth 2 (two) times daily.  Roseanne Kaufman [employer] [facility]) OINT Special Instructions: Apply to sacrum, bilateral buttocks and coccyx qshift for prevention. Every Shift Day, Evening, Night  . donepezil (ARICEPT) 5 MG tablet Take 5 mg by mouth at bedtime.  Marland Kitchen ipratropium-albuterol (DUONEB) 0.5-2.5 (3) MG/3ML SOLN Take 3 mLs by nebulization every 6 (six) hours.  . metoprolol tartrate (LOPRESSOR) 25 MG tablet Take 0.5 tablets (12.5 mg total) by mouth 2 (two) times daily.  . Multiple Vitamin (MULTIVITAMIN ADULT PO) Take 1 tablet by mouth daily.  . NON FORMULARY Diet Change: Regular with nectar thick liquids  . OXYGEN Inhale 3 L into the lungs continuous.   . polyethylene glycol (MIRALAX / GLYCOLAX) 17 g packet Take 17 g by mouth daily as needed for mild constipation.  . risperiDONE (RISPERDAL) 0.5 MG tablet Take 1 tablet (0.5 mg total) by mouth at bedtime.  . simvastatin (ZOCOR) 40 MG tablet Take 20 mg by mouth every evening.   . sitaGLIPtin (JANUVIA) 25 MG tablet Take 25 mg by mouth daily.   No facility-administered encounter medications on file as of 03/21/2020.     SIGNIFICANT DIAGNOSTIC EXAMS   PREVIOUS  02-05-20: chest x-ray: Worsening patchy bilateral airspace disease compatible with multifocal pneumonia. Small right effusion.  02-06-20: ct of head:  1. Interval development of extensive bilateral upper lobe airspace opacities, right greater than left. Findings are concerning for multifocal pneumonia. 2. New small right pleural effusion and moderate left pleural effusion  with overlying areas of compressive type atelectasis. 3. Aortic atherosclerosis, in addition to RCA and LAD coronary artery disease. Please note that although the presence of coronary artery calcium documents the presence of coronary artery disease, the severity of this disease and any potential stenosis cannot be assessed on this non-gated CT examination. Assessment for potential risk factor modification, dietary therapy or pharmacologic therapy may be warranted, if clinically indicated. Aortic Atherosclerosis   02-13-20: chest x-ray:  1. Multifocal bilateral pulmonary infiltrates and small bilateral pleural effusions again noted. Similar findings noted on prior exam. 2.  Stable cardiomegaly.  02-13-20: swallow study: Recommend D3/mech soft and NTL  03-04-20: chest x-ray:  1. Cardiomegaly.  2. Right pleural effusion.   NO NEW EXAMS.    LABS REVIEWED PREVIOUS  12-16-19: hgb  a1c 6.8  02-05-20: wbc 8.5; hgb 9.8; hct 32.4; mcv 94.7plt 344 glucose 285; bun 20; creat 0.70; k+ 3.0; na++ 143; ca 8.3 mag 2.2 blood culture: no growth 02-06-20: vit B 12: 725; folate 6.2; ammonia 38; tsh 1.503; free t4: 1.24 02-08-20: wbc 7.6; hgb 10.1; hct 34.4; mcv 98.6 plt 299; glucose 113; bun 12; creat 0.64; k+ 3.2; na++ 146; ca 7.9  02-10-20: glucose 118; bun 19; creat 0.70; k+ 3.1; na++ 143; ca 7.9 liver normal albumin 2.1 02-14-20: wbc 5.8; hgb 9.0; hct 29.7; mcv 94.9 plt 245; glucose 125; bun 27; creat 0.80; k+ 3.9; na++ 142; ca 8.5  02-29-20: glucose 121; bun 51; creat 1.75; k+ 4.5; na++ 137 ca 8.6   03-01-20: chol 123 LDL 56 trig 40 hdl 59 03-03-20: glucose 246; bun 42; creat 1.07 ;k+ 4.4; na++ 137; ca 9.1  03-07-20: glucose 158; bun 12; creat 0.72; k+ 35; na++ 139; ca 8.8  NO NEW LABS.    Review of Systems  Unable to perform ROS: Dementia (unable to participate )    Physical Exam Constitutional:      General: She is not in acute distress.    Appearance: She is well-developed. She is not diaphoretic.    Neck:     Thyroid: No thyromegaly.  Cardiovascular:     Rate and Rhythm: Normal rate and regular rhythm.     Pulses: Normal pulses.     Heart sounds: Normal heart sounds.  Pulmonary:     Effort: Pulmonary effort is normal. No respiratory distress.     Breath sounds: Normal breath sounds.  Abdominal:     General: Bowel sounds are normal. There is no distension.     Palpations: Abdomen is soft.     Tenderness: There is no abdominal tenderness.  Musculoskeletal:        General: Normal range of motion.     Cervical back: Neck supple.     Right lower leg: No edema.     Left lower leg: No edema.  Lymphadenopathy:     Cervical: No cervical adenopathy.  Skin:    General: Skin is warm and dry.  Neurological:     Mental Status: She is alert. Mental status is at baseline.  Psychiatric:        Mood and Affect: Mood normal.     ASSESSMENT/ PLAN:  TODAY  1. 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 Controlled type 2 diabetes mellitus without complication without current long term use of insulin  Will check cbg daily Will lower risperdal to 0 .25 mg nightyy for one week then stop Will continue current plan of care Will monitor her status.      MD is aware of resident's narcotic use and is in agreement with current plan of care. We will attempt to wean resident as appropriate.  Ok Edwards NP [facility]  Contact [phone] Monday through Friday 8am- 5pm  After hours call [phone]

## 2020-04-03 ENCOUNTER — Ambulatory Visit (INDEPENDENT_AMBULATORY_CARE_PROVIDER_SITE_OTHER): Payer: Medicare Other | Admitting: Nurse Practitioner

## 2020-04-03 ENCOUNTER — Encounter (INDEPENDENT_AMBULATORY_CARE_PROVIDER_SITE_OTHER): Payer: Self-pay | Admitting: Nurse Practitioner

## 2020-04-03 VITALS — BP 130/80 | HR 64 | Temp 97.4°F | Ht 65.0 in | Wt 130.0 lb

## 2020-04-03 DIAGNOSIS — Z23 Encounter for immunization: Secondary | ICD-10-CM | POA: Diagnosis not present

## 2020-04-03 DIAGNOSIS — E119 Type 2 diabetes mellitus without complications: Secondary | ICD-10-CM | POA: Diagnosis not present

## 2020-04-03 DIAGNOSIS — I10 Essential (primary) hypertension: Secondary | ICD-10-CM

## 2020-04-03 DIAGNOSIS — R2232 Localized swelling, mass and lump, left upper limb: Secondary | ICD-10-CM

## 2020-04-03 DIAGNOSIS — R5383 Other fatigue: Secondary | ICD-10-CM

## 2020-04-03 NOTE — Progress Notes (Signed)
Subjective:  Patient ID: Amy Hoffman, female    DOB: 1940-11-23  Age: 79 y.o. MRN: [id number]  CC:  Chief Complaint  Patient presents with  . Establish Care  . current resident at [facility] center      HPI  This patient arrives today to establish care at this practice.  She is a 79 year old female.  She has been staying at the pain center for the last few months.  Apparently she was hospitalized back in April of this year for asthma and pneumonia.  She also developed atrial fibrillation with RVR during her visit.  She has since recovered from her pneumonia.  She is accompanied today by her husband.  She is pleasantly confused, and has no complaints today.  She continues on her chronic medications as prescribed.  [facility] does provide me with recent vital signs, she has had a few elevated blood pressures, but generally blood pressure seems to be fairly well controlled on her current regimen.  I have also provided with a list of blood sugars.  I do not see any evidence of hypoglycemia, however she does have some hyperglycemia at times.  She does have dementia and is therefore poor historian.   Past Medical History:  Diagnosis Date  . Abnormal uterine and vaginal bleeding, unspecified   . Allergic rhinitis due to pollen   . Anxiety   . Asthma   . Cancer [facility])    breast cancer - right  . Dementia ([place])   . Depression   . Diabetes mellitus without complication ([place])   . Essential (primary) hypertension   . Hyperlipidemia, unspecified   . Hypertension   . Major depressive disorder, single episode, unspecified   . Obesity, unspecified   . Pain in joint involving shoulder region   . Reflux esophagitis   . Trigger finger, acquired   . Type 2 diabetes mellitus with hyperglycemia ([place])   . Unspecified asthma with (acute) exacerbation   . 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 ([place])   . Unspecified osteoarthritis, unspecified site       Family History    Family history unknown: Yes    Social History   Social History Narrative  . Not on file   Social History   Tobacco Use  . Smoking status: Never Smoker  . Smokeless tobacco: Never Used  Substance Use Topics  . Alcohol use: No     Current Meds  Medication Sig  . acetaminophen (TYLENOL) 325 MG tablet Take 2 tablets (650 mg total) by mouth every 6 (six) hours as needed for mild pain (or Fever >/= 101).  Marland Kitchen amiodarone (PACERONE) 200 MG tablet Take 200 mg by mouth daily.  Marland Kitchen amLODipine (NORVASC) 5 MG tablet Take 5 mg by mouth daily.  Marland Kitchen anastrozole (ARIMIDEX) 1 MG tablet Take 1 mg by mouth daily.  Marland Kitchen apixaban (ELIQUIS) 5 MG TABS tablet Take 5 mg by mouth 2 (two) times daily.  Roseanne Kaufman [place] [facility]) OINT Special Instructions: Apply to sacrum, bilateral buttocks and coccyx qshift for prevention. Every Shift Day, Evening, Night  . donepezil (ARICEPT) 5 MG tablet Take 5 mg by mouth at bedtime.  Marland Kitchen ipratropium-albuterol (DUONEB) 0.5-2.5 (3) MG/3ML SOLN Take 3 mLs by nebulization every 6 (six) hours.  . metoprolol tartrate (LOPRESSOR) 25 MG tablet Take 0.5 tablets (12.5 mg total) by mouth 2 (two) times daily.  . Multiple Vitamin (MULTIVITAMIN ADULT PO) Take 1 tablet by mouth daily.  . NON FORMULARY Diet Change:  Regular with nectar thick liquids  . polyethylene glycol (MIRALAX / GLYCOLAX) 17 g packet Take 17 g by mouth daily as needed for mild constipation.  . risperiDONE (RISPERDAL) 0.5 MG tablet Take 1 tablet (0.5 mg total) by mouth at bedtime.  . simvastatin (ZOCOR) 40 MG tablet Take 20 mg by mouth every evening.   . sitaGLIPtin (JANUVIA) 25 MG tablet Take 25 mg by mouth daily.  . [DISCONTINUED] OXYGEN Inhale 3 L into the lungs continuous.     ROS:  Review of Systems  Eyes: Negative.   Respiratory: Negative.   Cardiovascular: Negative.   Neurological: Negative.      Objective:   Today's Vitals: BP 130/80 (BP Location: Right Arm, Patient Position: Sitting, Cuff Size:  Normal)   Pulse 64   Temp (!) 97.4 F (36.3 C) (Temporal)   Ht '5\' 5"'$  (1.651 m)   Wt 130 lb (59 kg)   SpO2 97%   BMI 21.63 kg/m  Vitals with BMI 04/03/2020 03/21/2020 03/20/2020  Height '5\' 5"'$  '5\' 5"'$  '5\' 5"'$   Weight 130 lbs 130 lbs 3 oz 130 lbs 3 oz  BMI 21.63 55.73 22.02  Systolic 542 706 237  Diastolic 80 74 74  Pulse 64 62 53     Physical Exam Vitals reviewed.  Constitutional:      General: She is not in acute distress.    Appearance: Normal appearance.  HENT:     Head: Normocephalic and atraumatic.  Neck:     Vascular: No carotid bruit.  Cardiovascular:     Rate and Rhythm: Normal rate. Rhythm irregular.     Pulses: Normal pulses.     Heart sounds: Normal heart sounds.  Pulmonary:     Effort: Pulmonary effort is normal.     Breath sounds: Normal breath sounds.  Skin:    General: Skin is warm and dry.       Neurological:     General: No focal deficit present.     Mental Status: She is alert. Mental status is at baseline. She is disoriented.  Psychiatric:        Mood and Affect: Mood normal.        Behavior: Behavior normal.        Judgment: Judgment normal.          Assessment and Plan   1. Need for pneumococcal vaccination   2. Fatigue, unspecified type   3. Essential (primary) hypertension   4. Controlled type 2 diabetes mellitus without complication, without long-term current use of insulin ([place])   5. Mass of arm, left      Plan: 1.  I do not see where she has had Pneumovax 23 administered, thus we will administer that for her today.  2.-4.   She does seem a bit sleepy today on exam and her husband does admit to her napping on a regular basis.  She easily is aroused to voice and does participate in conversations.  I will collect blood work today for further evaluation of her chronic conditions.   Tests ordered Orders Placed This Encounter  Procedures  . US SOFT TISSUE UPPER EXTREMITY LIMITED LEFT (NON-VASCULAR)  . Pneumococcal polysaccharide vaccine  23-valent greater than or equal to 2yo subcutaneous/IM  . CBC  . CMP with eGFR([facility])  . Lipid Panel  . Hemoglobin A1c  . TSH    5. Prior to patient leaving for the afternoon, her husband did mention that the patient has a mass to her left upper arm and  it has migrated down her arm over the last 6 months.  They are concerned about what this could be.  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  With that said I am still can order ultrasound of the left upper arm for further evaluation.  No orders of the defined types were placed in this encounter.   Patient to follow-up in 6 weeks to discuss blood work results, or sooner as needed.  [name], NP

## 2020-04-04 LAB — COMPLETE METABOLIC PANEL WITH GFR
AG Ratio: 1.5 (calc) (ref 1.0–2.5)
ALT: 42 U/L — ABNORMAL HIGH (ref 6–29)
AST: 21 U/L (ref 10–35)
Albumin: 3.7 g/dL (ref 3.6–5.1)
Alkaline phosphatase (APISO): 77 U/L (ref 37–153)
BUN/Creatinine Ratio: 30 (calc) — ABNORMAL HIGH (ref 6–22)
BUN: 37 mg/dL — ABNORMAL HIGH (ref 7–25)
CO2: 29 mmol/L (ref 20–32)
Calcium: 9.2 mg/dL (ref 8.6–10.4)
Chloride: 101 mmol/L (ref 98–110)
Creat: 1.24 mg/dL — ABNORMAL HIGH (ref 0.60–0.93)
GFR, Est African American: 48 mL/min/{1.73_m2} — ABNORMAL LOW (ref >=60)
GFR, Est Non African American: 41 mL/min/{1.73_m2} — ABNORMAL LOW (ref >=60)
Globulin: 2.4 g/dL (calc) (ref 1.9–3.7)
Glucose, Bld: 204 mg/dL — ABNORMAL HIGH (ref 65–99)
Potassium: 5.1 mmol/L (ref 3.5–5.3)
Sodium: 139 mmol/L (ref 135–146)
Total Bilirubin: 0.5 mg/dL (ref 0.2–1.2)
Total Protein: 6.1 g/dL (ref 6.1–8.1)

## 2020-04-04 LAB — CBC
HCT: 31.7 % — ABNORMAL LOW (ref 35.0–45.0)
Hemoglobin: 10.1 g/dL — ABNORMAL LOW (ref 11.7–15.5)
MCH: 29.1 pg (ref 27.0–33.0)
MCHC: 31.9 g/dL — ABNORMAL LOW (ref 32.0–36.0)
MCV: 91.4 fL (ref 80.0–100.0)
MPV: 11.8 fL (ref 7.5–12.5)
Platelets: 244 10*3/uL (ref 140–400)
RBC: 3.47 10*6/uL — ABNORMAL LOW (ref 3.80–5.10)
RDW: 14.1 % (ref 11.0–15.0)
WBC: 9.4 10*3/uL (ref 3.8–10.8)

## 2020-04-04 LAB — LIPID PANEL
Cholesterol: 152 mg/dL (ref <200)
HDL: 70 mg/dL (ref >=50)
LDL Cholesterol (Calc): 67 mg/dL (calc)
Non-HDL Cholesterol (Calc): 82 mg/dL (calc) (ref <130)
Total CHOL/HDL Ratio: 2.2 (calc) (ref <5.0)
Triglycerides: 71 mg/dL (ref <150)

## 2020-04-04 LAB — HEMOGLOBIN A1C
Hgb A1c MFr Bld: 8.3 % of total Hgb — ABNORMAL HIGH (ref <5.7)
Mean Plasma Glucose: 192 (calc)
eAG (mmol/L): 10.6 (calc)

## 2020-04-04 LAB — TSH: TSH: 2.76 mIU/L (ref 0.40–4.50)

## 2020-04-08 ENCOUNTER — Ambulatory Visit (HOSPITAL_COMMUNITY): Admit: 2020-04-08 | Payer: Medicare Other

## 2020-04-10 DIAGNOSIS — R262 Difficulty in walking, not elsewhere classified: Secondary | ICD-10-CM | POA: Diagnosis not present

## 2020-04-10 DIAGNOSIS — R296 Repeated falls: Secondary | ICD-10-CM | POA: Diagnosis not present

## 2020-04-10 DIAGNOSIS — I5032 Chronic diastolic (congestive) heart failure: Secondary | ICD-10-CM | POA: Diagnosis not present

## 2020-04-10 DIAGNOSIS — M6281 Muscle weakness (generalized): Secondary | ICD-10-CM | POA: Diagnosis not present

## 2020-04-11 DIAGNOSIS — R262 Difficulty in walking, not elsewhere classified: Secondary | ICD-10-CM | POA: Diagnosis not present

## 2020-04-11 DIAGNOSIS — R296 Repeated falls: Secondary | ICD-10-CM | POA: Diagnosis not present

## 2020-04-11 DIAGNOSIS — M6281 Muscle weakness (generalized): Secondary | ICD-10-CM | POA: Diagnosis not present

## 2020-04-11 DIAGNOSIS — I5032 Chronic diastolic (congestive) heart failure: Secondary | ICD-10-CM | POA: Diagnosis not present

## 2020-04-12 DIAGNOSIS — M6281 Muscle weakness (generalized): Secondary | ICD-10-CM | POA: Diagnosis not present

## 2020-04-12 DIAGNOSIS — R262 Difficulty in walking, not elsewhere classified: Secondary | ICD-10-CM | POA: Diagnosis not present

## 2020-04-12 DIAGNOSIS — I5032 Chronic diastolic (congestive) heart failure: Secondary | ICD-10-CM | POA: Diagnosis not present

## 2020-04-12 DIAGNOSIS — R296 Repeated falls: Secondary | ICD-10-CM | POA: Diagnosis not present

## 2020-04-15 DIAGNOSIS — R262 Difficulty in walking, not elsewhere classified: Secondary | ICD-10-CM | POA: Diagnosis not present

## 2020-04-15 DIAGNOSIS — M6281 Muscle weakness (generalized): Secondary | ICD-10-CM | POA: Diagnosis not present

## 2020-04-15 DIAGNOSIS — I5032 Chronic diastolic (congestive) heart failure: Secondary | ICD-10-CM | POA: Diagnosis not present

## 2020-04-15 DIAGNOSIS — R296 Repeated falls: Secondary | ICD-10-CM | POA: Diagnosis not present

## 2020-04-16 DIAGNOSIS — R296 Repeated falls: Secondary | ICD-10-CM | POA: Diagnosis not present

## 2020-04-16 DIAGNOSIS — M6281 Muscle weakness (generalized): Secondary | ICD-10-CM | POA: Diagnosis not present

## 2020-04-16 DIAGNOSIS — I5032 Chronic diastolic (congestive) heart failure: Secondary | ICD-10-CM | POA: Diagnosis not present

## 2020-04-16 DIAGNOSIS — R262 Difficulty in walking, not elsewhere classified: Secondary | ICD-10-CM | POA: Diagnosis not present

## 2020-04-17 DIAGNOSIS — M6281 Muscle weakness (generalized): Secondary | ICD-10-CM | POA: Diagnosis not present

## 2020-04-17 DIAGNOSIS — R262 Difficulty in walking, not elsewhere classified: Secondary | ICD-10-CM | POA: Diagnosis not present

## 2020-04-17 DIAGNOSIS — R296 Repeated falls: Secondary | ICD-10-CM | POA: Diagnosis not present

## 2020-04-17 DIAGNOSIS — I5032 Chronic diastolic (congestive) heart failure: Secondary | ICD-10-CM | POA: Diagnosis not present

## 2020-04-18 DIAGNOSIS — R262 Difficulty in walking, not elsewhere classified: Secondary | ICD-10-CM | POA: Diagnosis not present

## 2020-04-18 DIAGNOSIS — M6281 Muscle weakness (generalized): Secondary | ICD-10-CM | POA: Diagnosis not present

## 2020-04-18 DIAGNOSIS — R296 Repeated falls: Secondary | ICD-10-CM | POA: Diagnosis not present

## 2020-04-18 DIAGNOSIS — I5032 Chronic diastolic (congestive) heart failure: Secondary | ICD-10-CM | POA: Diagnosis not present

## 2020-04-19 DIAGNOSIS — M6281 Muscle weakness (generalized): Secondary | ICD-10-CM | POA: Diagnosis not present

## 2020-04-19 DIAGNOSIS — R262 Difficulty in walking, not elsewhere classified: Secondary | ICD-10-CM | POA: Diagnosis not present

## 2020-04-19 DIAGNOSIS — R296 Repeated falls: Secondary | ICD-10-CM | POA: Diagnosis not present

## 2020-04-19 DIAGNOSIS — I5032 Chronic diastolic (congestive) heart failure: Secondary | ICD-10-CM | POA: Diagnosis not present

## 2020-04-21 DIAGNOSIS — R296 Repeated falls: Secondary | ICD-10-CM | POA: Diagnosis not present

## 2020-04-21 DIAGNOSIS — R262 Difficulty in walking, not elsewhere classified: Secondary | ICD-10-CM | POA: Diagnosis not present

## 2020-04-21 DIAGNOSIS — I5032 Chronic diastolic (congestive) heart failure: Secondary | ICD-10-CM | POA: Diagnosis not present

## 2020-04-21 DIAGNOSIS — M6281 Muscle weakness (generalized): Secondary | ICD-10-CM | POA: Diagnosis not present

## 2020-04-22 ENCOUNTER — Encounter (INDEPENDENT_AMBULATORY_CARE_PROVIDER_SITE_OTHER): Payer: Self-pay

## 2020-04-22 ENCOUNTER — Non-Acute Institutional Stay (SKILLED_NURSING_FACILITY): Payer: Medicare Other | Admitting: Adult Health

## 2020-04-22 ENCOUNTER — Encounter: Payer: Self-pay | Admitting: Adult Health

## 2020-04-22 DIAGNOSIS — M6281 Muscle weakness (generalized): Secondary | ICD-10-CM | POA: Diagnosis not present

## 2020-04-22 DIAGNOSIS — I5031 Acute diastolic (congestive) heart failure: Secondary | ICD-10-CM | POA: Diagnosis not present

## 2020-04-22 DIAGNOSIS — R262 Difficulty in walking, not elsewhere classified: Secondary | ICD-10-CM | POA: Diagnosis not present

## 2020-04-22 DIAGNOSIS — K5909 Other constipation: Secondary | ICD-10-CM

## 2020-04-22 DIAGNOSIS — R296 Repeated falls: Secondary | ICD-10-CM | POA: Diagnosis not present

## 2020-04-22 DIAGNOSIS — I4811 Longstanding persistent atrial fibrillation: Secondary | ICD-10-CM

## 2020-04-22 DIAGNOSIS — I5032 Chronic diastolic (congestive) heart failure: Secondary | ICD-10-CM | POA: Diagnosis not present

## 2020-04-22 NOTE — Progress Notes (Signed)
Location:    [facility] Room Number: 149/W Place of Service:  SNF (31)   CODE STATUS: DNR  Allergies  Allergen Reactions  . Codeine   . Zantac [Ranitidine Hcl]     Chief Complaint  Patient presents with  . Medical Management of Chronic Issues           Chronic constipation:   Acute diastolic CHF (congestive heart failure)  Longstanding persistent atrial fibrillation:    HPI:  She is a 79 year old long term resident of this facility being seen for the management of her chronic illnesses: Chronic constipation:    Acute diastolic CHF (congestive heart failure)   Longstanding persistent atrial fibrillation:. There are no reports of uncontrolled pain. She has completed a short burst of lasix for her weight change 04-14-20: 129 pounds; 04-17-20: 125 pounds; 04-18-20: 127 pounds; 04-21-20 129 pounds. She does have periods of agitation present with her roommate and others.   Past Medical History:  Diagnosis Date  . Abnormal uterine and vaginal bleeding, unspecified   . Allergic rhinitis due to pollen   . Anxiety   . Asthma   . Cancer [facility])    breast cancer - right  . Dementia ([place])   . Depression   . Diabetes mellitus without complication ([place])   . Essential (primary) hypertension   . Hyperlipidemia, unspecified   . Hypertension   . Major depressive disorder, single episode, unspecified   . Obesity, unspecified   . Pain in joint involving shoulder region   . Reflux esophagitis   . Trigger finger, acquired   . Type 2 diabetes mellitus with hyperglycemia ([place])   . Unspecified asthma with (acute) exacerbation   . 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 ([place])   . Unspecified osteoarthritis, unspecified site     Past Surgical History:  Procedure Laterality Date  . CHOLECYSTECTOMY    . MASTECTOMY Right   . RE-EXCISION OF BREAST LUMPECTOMY      Social History   Socioeconomic History  . Marital status: Married    Spouse name: Not on file  .  Number of children: Not on file  . Years of education: Not on file  . Highest education level: Not on file  Occupational History  . Not on file  Tobacco Use  . Smoking status: Never Smoker  . Smokeless tobacco: Never Used  Substance and Sexual Activity  . Alcohol use: No  . Drug use: No  . Sexual activity: Not on file  Other Topics Concern  . Not on file  Social History Narrative  . Not on file   Social Determinants of Health   Financial Resource Strain:   . Difficulty of Paying Living Expenses:   Food Insecurity:   . Worried About Charity fundraiser in the Last Year:   . Arboriculturist in the Last Year:   Transportation Needs:   . Film/video editor (Medical):   Marland Kitchen Lack of Transportation (Non-Medical):   Physical Activity:   . Days of Exercise per Week:   . Minutes of Exercise per Session:   Stress:   . Feeling of Stress :   Social Connections:   . Frequency of Communication with Friends and Family:   . Frequency of Social Gatherings with Friends and Family:   . Attends Religious Services:   . Active Member of Clubs or Organizations:   . Attends Archivist Meetings:   Marland Kitchen Marital Status:   Intimate Partner Violence:   .  Fear of Current or Ex-Partner:   . Emotionally Abused:   Marland Kitchen Physically Abused:   . Sexually Abused:    Family History  Family history unknown: Yes      VITAL SIGNS BP (!) 148/78   Pulse 63   Ht 5\' 5"  (1.651 m)   Wt 129 lb 12.8 oz (58.9 kg)   SpO2 90%   BMI 21.60 kg/m   Outpatient Encounter Medications as of 04/22/2020  Medication Sig  . acetaminophen (TYLENOL) 325 MG tablet Take 2 tablets (650 mg total) by mouth every 6 (six) hours as needed for mild pain (or Fever >/= 101).  Marland Kitchen amiodarone (PACERONE) 200 MG tablet Take 200 mg by mouth daily.  Marland Kitchen amLODipine (NORVASC) 5 MG tablet Take 5 mg by mouth daily.  Marland Kitchen anastrozole (ARIMIDEX) 1 MG tablet Take 1 mg by mouth daily.  Marland Kitchen apixaban (ELIQUIS) 5 MG TABS tablet Take 5 mg by mouth 2  (two) times daily.  Roseanne Kaufman [place] [facility]) OINT Special Instructions: Apply to sacrum, bilateral buttocks and coccyx qshift for prevention. Every Shift Day, Evening, Night  . donepezil (ARICEPT) 5 MG tablet Take 5 mg by mouth at bedtime.  . furosemide (LASIX) 40 MG tablet Take 40 mg by mouth daily. Special Instructions: give x3 days per Dr. Mariea Clonts due to 3lb weight gain until seen by NP  . ipratropium-albuterol (DUONEB) 0.5-2.5 (3) MG/3ML SOLN Take 3 mLs by nebulization every 6 (six) hours.  . metoprolol tartrate (LOPRESSOR) 25 MG tablet Take 0.5 tablets (12.5 mg total) by mouth 2 (two) times daily.  . Multiple Vitamin (MULTIVITAMIN ADULT PO) Take 1 tablet by mouth daily.  . NON FORMULARY Diet Change: Regular with nectar thick liquids  . OXYGEN Inhale 3 L into the lungs continuous.  . polyethylene glycol (MIRALAX / GLYCOLAX) 17 g packet Take 17 g by mouth daily as needed for mild constipation.  . simvastatin (ZOCOR) 40 MG tablet Take 20 mg by mouth every evening.   . sitaGLIPtin (JANUVIA) 25 MG tablet Take 25 mg by mouth daily.  . [DISCONTINUED] risperiDONE (RISPERDAL) 0.5 MG tablet Take 1 tablet (0.5 mg total) by mouth at bedtime.   No facility-administered encounter medications on file as of 04/22/2020.     SIGNIFICANT DIAGNOSTIC EXAMS   PREVIOUS  02-05-20: chest x-ray: Worsening patchy bilateral airspace disease compatible with multifocal pneumonia. Small right effusion.  02-06-20: ct of head:  1. Interval development of extensive bilateral upper lobe airspace opacities, right greater than left. Findings are concerning for multifocal pneumonia. 2. New small right pleural effusion and moderate left pleural effusion with overlying areas of compressive type atelectasis. 3. Aortic atherosclerosis, in addition to RCA and LAD coronary artery disease. Please note that although the presence of coronary artery calcium documents the presence of coronary artery disease, the severity of  this disease and any potential stenosis cannot be assessed on this non-gated CT examination. Assessment for potential risk factor modification, dietary therapy or pharmacologic therapy may be warranted, if clinically indicated. Aortic Atherosclerosis   02-13-20: chest x-ray:  1. Multifocal bilateral pulmonary infiltrates and small bilateral pleural effusions again noted. Similar findings noted on prior exam. 2.  Stable cardiomegaly.  02-13-20: swallow study: Recommend D3/mech soft and NTL   03-04-20: chest x-ray:  1. Cardiomegaly.  2. Right pleural effusion.   NO NEW EXAMS.    LABS REVIEWED PREVIOUS  12-16-19: hgb  a1c 6.8  02-05-20: wbc 8.5; hgb 9.8; hct 32.4; mcv 94.7plt 344 glucose 285; bun 20;  creat 0.70; k+ 3.0; na++ 143; ca 8.3 mag 2.2 blood culture: no growth 02-06-20: vit B 12: 725; folate 6.2; ammonia 38; tsh 1.503; free t4: 1.24 02-08-20: wbc 7.6; hgb 10.1; hct 34.4; mcv 98.6 plt 299; glucose 113; bun 12; creat 0.64; k+ 3.2; na++ 146; ca 7.9  02-10-20: glucose 118; bun 19; creat 0.70; k+ 3.1; na++ 143; ca 7.9 liver normal albumin 2.1 02-14-20: wbc 5.8; hgb 9.0; hct 29.7; mcv 94.9 plt 245; glucose 125; bun 27; creat 0.80; k+ 3.9; na++ 142; ca 8.5  02-29-20: glucose 121; bun 51; creat 1.75; k+ 4.5; na++ 137 ca 8.6   03-01-20: chol 123 LDL 56 trig 40 hdl 59 03-03-20: glucose 246; bun 42; creat 1.07 ;k+ 4.4; na++ 137; ca 9.1  03-07-20: glucose 158; bun 12; creat 0.72; k+ 35; na++ 139; ca 8.8  TODAY  04-03-20: wbc 9.1; hgb 10.1; hct 31.7; mcv 91.4 plt 244; glucose 204; bun 37; creat 1.24; k+ 5.1; na++ 139; ca 9.2 liver normal albumin 3.7 chol 152; ldl 67; trig 70; hdl 71; hgb a1c 8.3 tsh 2.76   Review of Systems  Unable to perform ROS: Dementia (unable to participate )    Physical Exam Constitutional:      General: She is not in acute distress.    Appearance: She is well-developed. She is not diaphoretic.  Neck:     Thyroid: No thyromegaly.  Cardiovascular:     Rate and Rhythm:  Normal rate and regular rhythm.     Pulses: Normal pulses.     Heart sounds: Normal heart sounds.  Pulmonary:     Effort: Pulmonary effort is normal. No respiratory distress.     Breath sounds: Normal breath sounds.  Abdominal:     General: Bowel sounds are normal. There is no distension.     Palpations: Abdomen is soft.     Tenderness: There is no abdominal tenderness.  Musculoskeletal:        General: Normal range of motion.     Cervical back: Neck supple.     Right lower leg: No edema.     Left lower leg: No edema.  Lymphadenopathy:     Cervical: No cervical adenopathy.  Skin:    General: Skin is warm and dry.  Neurological:     Mental Status: She is alert. Mental status is at baseline.  Psychiatric:        Mood and Affect: Mood normal.      ASSESSMENT/ PLAN:  TODAY  1. Chronic constipation: is stable will stop miralax due to thickened liquids.   2. Acute diastolic CHF (congestive heart failure) is stable EF 50-55% (12-17-19) is compensated will continue lopressor 12.5 mg twice daily has completed short burst of lasix.   3. Longstanding persistent atrial fibrillation: heart rate is stable will continue amiodarone 200 mg daily lopressor 12.5 mg twice daily for rate control and eliquis 5 mg twice daily   PREVIOUS   4. Hypertensive heart disease with chronic diastolic congestive heart failure: is stable b/p 148/78 will continue norvasc 5 mg daily lopressor 12.5 mg twice daily   5. Oropharyngeal dysphagia: is stable no signs of aspiration present; will continue nectar thick liquids  6. Type 2 diabetes mellitus without complications without long term current use of insulin: is not well controlled hgb a1c 8.3 will increase januvia to 50 mg daily   7. Dementia without behavioral disturbance unspecified dementia type: is stable weight is 129 pounds will continue aricept 5 mg daily  8 Malignant neoplasm of female breast unspecified estrogen receptor status unspecified site of  breast: is stable will continue arimidex 1 mg daily  9. Hyperlipidemia associated with type 2 diabetes mellitus: is stable LDL 67 will continue zocor 20 mg daily   10. 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 Does have episodes of agitation; will monitor        MD is aware of resident's narcotic use and is in agreement with current plan of care. We will attempt to wean resident as appropriate.  Ok Edwards NP [facility]  Contact [phone] Monday through Friday 8am- 5pm  After hours call [phone]

## 2020-04-23 DIAGNOSIS — M6281 Muscle weakness (generalized): Secondary | ICD-10-CM | POA: Diagnosis not present

## 2020-04-23 DIAGNOSIS — I5032 Chronic diastolic (congestive) heart failure: Secondary | ICD-10-CM | POA: Diagnosis not present

## 2020-04-23 DIAGNOSIS — R262 Difficulty in walking, not elsewhere classified: Secondary | ICD-10-CM | POA: Diagnosis not present

## 2020-04-23 DIAGNOSIS — R296 Repeated falls: Secondary | ICD-10-CM | POA: Diagnosis not present

## 2020-04-23 DIAGNOSIS — K5909 Other constipation: Secondary | ICD-10-CM | POA: Insufficient documentation

## 2020-04-24 DIAGNOSIS — I5032 Chronic diastolic (congestive) heart failure: Secondary | ICD-10-CM | POA: Diagnosis not present

## 2020-04-24 DIAGNOSIS — M6281 Muscle weakness (generalized): Secondary | ICD-10-CM | POA: Diagnosis not present

## 2020-04-24 DIAGNOSIS — R262 Difficulty in walking, not elsewhere classified: Secondary | ICD-10-CM | POA: Diagnosis not present

## 2020-04-24 DIAGNOSIS — R296 Repeated falls: Secondary | ICD-10-CM | POA: Diagnosis not present

## 2020-04-25 DIAGNOSIS — R262 Difficulty in walking, not elsewhere classified: Secondary | ICD-10-CM | POA: Diagnosis not present

## 2020-04-25 DIAGNOSIS — M6281 Muscle weakness (generalized): Secondary | ICD-10-CM | POA: Diagnosis not present

## 2020-04-25 DIAGNOSIS — R296 Repeated falls: Secondary | ICD-10-CM | POA: Diagnosis not present

## 2020-04-25 DIAGNOSIS — I5032 Chronic diastolic (congestive) heart failure: Secondary | ICD-10-CM | POA: Diagnosis not present

## 2020-04-26 ENCOUNTER — Ambulatory Visit (HOSPITAL_COMMUNITY)
Admission: RE | Admit: 2020-04-26 | Discharge: 2020-04-26 | Disposition: A | Discharge status: Home / Self Care | Payer: Medicare Other | Source: Ambulatory Visit | Attending: Nurse Practitioner | Admitting: Nurse Practitioner

## 2020-04-26 DIAGNOSIS — R2232 Localized swelling, mass and lump, left upper limb: Secondary | ICD-10-CM | POA: Diagnosis not present

## 2020-05-01 DIAGNOSIS — M6281 Muscle weakness (generalized): Secondary | ICD-10-CM | POA: Diagnosis not present

## 2020-05-01 DIAGNOSIS — I5032 Chronic diastolic (congestive) heart failure: Secondary | ICD-10-CM | POA: Diagnosis not present

## 2020-05-01 DIAGNOSIS — R296 Repeated falls: Secondary | ICD-10-CM | POA: Diagnosis not present

## 2020-05-01 DIAGNOSIS — R262 Difficulty in walking, not elsewhere classified: Secondary | ICD-10-CM | POA: Diagnosis not present

## 2020-05-02 DIAGNOSIS — R262 Difficulty in walking, not elsewhere classified: Secondary | ICD-10-CM | POA: Diagnosis not present

## 2020-05-02 DIAGNOSIS — I5032 Chronic diastolic (congestive) heart failure: Secondary | ICD-10-CM | POA: Diagnosis not present

## 2020-05-02 DIAGNOSIS — R296 Repeated falls: Secondary | ICD-10-CM | POA: Diagnosis not present

## 2020-05-02 DIAGNOSIS — M6281 Muscle weakness (generalized): Secondary | ICD-10-CM | POA: Diagnosis not present

## 2020-05-03 DIAGNOSIS — R262 Difficulty in walking, not elsewhere classified: Secondary | ICD-10-CM | POA: Diagnosis not present

## 2020-05-03 DIAGNOSIS — R296 Repeated falls: Secondary | ICD-10-CM | POA: Diagnosis not present

## 2020-05-03 DIAGNOSIS — M6281 Muscle weakness (generalized): Secondary | ICD-10-CM | POA: Diagnosis not present

## 2020-05-03 DIAGNOSIS — I5032 Chronic diastolic (congestive) heart failure: Secondary | ICD-10-CM | POA: Diagnosis not present

## 2020-05-06 ENCOUNTER — Encounter: Payer: Self-pay | Admitting: Adult Health

## 2020-05-06 ENCOUNTER — Non-Acute Institutional Stay (SKILLED_NURSING_FACILITY): Payer: Medicare Other | Admitting: Adult Health

## 2020-05-06 DIAGNOSIS — E119 Type 2 diabetes mellitus without complications: Secondary | ICD-10-CM

## 2020-05-06 DIAGNOSIS — R262 Difficulty in walking, not elsewhere classified: Secondary | ICD-10-CM | POA: Diagnosis not present

## 2020-05-06 DIAGNOSIS — I5032 Chronic diastolic (congestive) heart failure: Secondary | ICD-10-CM | POA: Diagnosis not present

## 2020-05-06 DIAGNOSIS — R296 Repeated falls: Secondary | ICD-10-CM | POA: Diagnosis not present

## 2020-05-06 DIAGNOSIS — M6281 Muscle weakness (generalized): Secondary | ICD-10-CM | POA: Diagnosis not present

## 2020-05-06 NOTE — Progress Notes (Signed)
Location:    [facility] Room Number: 149/W Place of Service:  SNF (31)   CODE STATUS: DNR  Allergies  Allergen Reactions  . Codeine   . Zantac [Ranitidine Hcl]     Chief Complaint  Patient presents with  . Acute Visit    Diabetes    HPI:  Her cbg readings in the AM are near 200. Her PM readings are in the upper 200s. She is presently taking januvia 50 mg daily. Her family would like to avoid injectable therapies as able. Her renal function is stable. Her appetite is good. There are no reports of agitation no reports of excessive hunger; thirst.   Past Medical History:  Diagnosis Date  . Abnormal uterine and vaginal bleeding, unspecified   . Allergic rhinitis due to pollen   . Anxiety   . Asthma   . Cancer [facility])    breast cancer - right  . Dementia ([place])   . Depression   . Diabetes mellitus without complication ([place])   . Essential (primary) hypertension   . Hyperlipidemia, unspecified   . Hypertension   . Major depressive disorder, single episode, unspecified   . Obesity, unspecified   . Pain in joint involving shoulder region   . Reflux esophagitis   . Trigger finger, acquired   . Type 2 diabetes mellitus with hyperglycemia ([place])   . Unspecified asthma with (acute) exacerbation   . 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 ([place])   . Unspecified osteoarthritis, unspecified site     Past Surgical History:  Procedure Laterality Date  . CHOLECYSTECTOMY    . MASTECTOMY Right   . RE-EXCISION OF BREAST LUMPECTOMY      Social History   Socioeconomic History  . Marital status: Married    Spouse name: Not on file  . Number of children: Not on file  . Years of education: Not on file  . Highest education level: Not on file  Occupational History  . Not on file  Tobacco Use  . Smoking status: Never Smoker  . Smokeless tobacco: Never Used  Substance and Sexual Activity  . Alcohol use: No  . Drug use: No  . Sexual activity: Not  on file  Other Topics Concern  . Not on file  Social History Narrative  . Not on file   Social Determinants of Health   Financial Resource Strain:   . Difficulty of Paying Living Expenses: Not on file  Food Insecurity:   . Worried About Charity fundraiser in the Last Year: Not on file  . Ran Out of Food in the Last Year: Not on file  Transportation Needs:   . Lack of Transportation (Medical): Not on file  . Lack of Transportation (Non-Medical): Not on file  Physical Activity:   . Days of Exercise per Week: Not on file  . Minutes of Exercise per Session: Not on file  Stress:   . Feeling of Stress : Not on file  Social Connections:   . Frequency of Communication with Friends and Family: Not on file  . Frequency of Social Gatherings with Friends and Family: Not on file  . Attends Religious Services: Not on file  . Active Member of Clubs or Organizations: Not on file  . Attends Archivist Meetings: Not on file  . Marital Status: Not on file  Intimate Partner Violence:   . Fear of Current or Ex-Partner: Not on file  . Emotionally Abused: Not on file  . Physically  Abused: Not on file  . Sexually Abused: Not on file   Family History  Family history unknown: Yes      VITAL SIGNS BP 140/62   Pulse 72   Temp 98.1 F (36.7 C) (Oral)   Ht 5\' 5"  (1.651 m)   Wt 123 lb 1.6 oz (55.8 kg)   SpO2 93%   BMI 20.48 kg/m   Outpatient Encounter Medications as of 05/06/2020  Medication Sig  . acetaminophen (TYLENOL) 325 MG tablet Take 2 tablets (650 mg total) by mouth every 6 (six) hours as needed for mild pain (or Fever >/= 101).  Marland Kitchen amiodarone (PACERONE) 200 MG tablet Take 200 mg by mouth daily.  Marland Kitchen amLODipine (NORVASC) 5 MG tablet Take 5 mg by mouth daily.  Marland Kitchen anastrozole (ARIMIDEX) 1 MG tablet Take 1 mg by mouth daily.  Marland Kitchen apixaban (ELIQUIS) 5 MG TABS tablet Take 5 mg by mouth 2 (two) times daily.  Roseanne Kaufman [place] [facility]) OINT Special Instructions: Apply to  sacrum, bilateral buttocks and coccyx qshift for prevention. Every Shift Day, Evening, Night  . donepezil (ARICEPT) 5 MG tablet Take 5 mg by mouth at bedtime.  Marland Kitchen ipratropium-albuterol (DUONEB) 0.5-2.5 (3) MG/3ML SOLN Take 3 mLs by nebulization every 6 (six) hours.  . metoprolol tartrate (LOPRESSOR) 25 MG tablet Take 0.5 tablets (12.5 mg total) by mouth 2 (two) times daily.  . Multiple Vitamin (MULTIVITAMIN ADULT PO) Take 1 tablet by mouth daily.  . NON FORMULARY Diet Change: Regular with nectar thick liquids  . OXYGEN Inhale 3 L into the lungs continuous.  . simvastatin (ZOCOR) 40 MG tablet Take 20 mg by mouth every evening.   Derrill Memo ON 05/07/2020] sitaGLIPtin (JANUVIA) 100 MG tablet Take 100 mg by mouth daily.  . [DISCONTINUED] polyethylene glycol (MIRALAX / GLYCOLAX) 17 g packet Take 17 g by mouth daily as needed for mild constipation.  . [DISCONTINUED] sitaGLIPtin (JANUVIA) 25 MG tablet Take 25 mg by mouth daily.   No facility-administered encounter medications on file as of 05/06/2020.     SIGNIFICANT DIAGNOSTIC EXAMS   PREVIOUS  02-05-20: chest x-ray: Worsening patchy bilateral airspace disease compatible with multifocal pneumonia. Small right effusion.  02-06-20: ct of head:  1. Interval development of extensive bilateral upper lobe airspace opacities, right greater than left. Findings are concerning for multifocal pneumonia. 2. New small right pleural effusion and moderate left pleural effusion with overlying areas of compressive type atelectasis. 3. Aortic atherosclerosis, in addition to RCA and LAD coronary artery disease. Please note that although the presence of coronary artery calcium documents the presence of coronary artery disease, the severity of this disease and any potential stenosis cannot be assessed on this non-gated CT examination. Assessment for potential risk factor modification, dietary therapy or pharmacologic therapy may be warranted, if clinically  indicated. Aortic Atherosclerosis   02-13-20: chest x-ray:  1. Multifocal bilateral pulmonary infiltrates and small bilateral pleural effusions again noted. Similar findings noted on prior exam. 2.  Stable cardiomegaly.  02-13-20: swallow study: Recommend D3/mech soft and NTL   03-04-20: chest x-ray:  1. Cardiomegaly.  2. Right pleural effusion.   NO NEW EXAMS.    LABS REVIEWED PREVIOUS  12-16-19: hgb  a1c 6.8  02-05-20: wbc 8.5; hgb 9.8; hct 32.4; mcv 94.7plt 344 glucose 285; bun 20; creat 0.70; k+ 3.0; na++ 143; ca 8.3 mag 2.2 blood culture: no growth 02-06-20: vit B 12: 725; folate 6.2; ammonia 38; tsh 1.503; free t4: 1.24 02-08-20: wbc 7.6; hgb 10.1;  hct 34.4; mcv 98.6 plt 299; glucose 113; bun 12; creat 0.64; k+ 3.2; na++ 146; ca 7.9  02-10-20: glucose 118; bun 19; creat 0.70; k+ 3.1; na++ 143; ca 7.9 liver normal albumin 2.1 02-14-20: wbc 5.8; hgb 9.0; hct 29.7; mcv 94.9 plt 245; glucose 125; bun 27; creat 0.80; k+ 3.9; na++ 142; ca 8.5  02-29-20: glucose 121; bun 51; creat 1.75; k+ 4.5; na++ 137 ca 8.6   03-01-20: chol 123 LDL 56 trig 40 hdl 59 03-03-20: glucose 246; bun 42; creat 1.07 ;k+ 4.4; na++ 137; ca 9.1  03-07-20: glucose 158; bun 12; creat 0.72; k+ 35; na++ 139; ca 8.8 04-03-20: wbc 9.1; hgb 10.1; hct 31.7; mcv 91.4 plt 244; glucose 204; bun 37; creat 1.24; k+ 5.1; na++ 139; ca 9.2 liver normal albumin 3.7 chol 152; ldl 67; trig 70; hdl 71; hgb a1c 8.3 tsh 2.76  NO NEW LABS.   Review of Systems  Unable to perform ROS: Dementia (unable to participate )    Physical Exam Constitutional:      General: She is not in acute distress.    Appearance: She is well-developed. She is not diaphoretic.  Neck:     Thyroid: No thyromegaly.  Cardiovascular:     Rate and Rhythm: Normal rate and regular rhythm.     Pulses: Normal pulses.     Heart sounds: Normal heart sounds.  Pulmonary:     Effort: Pulmonary effort is normal. No respiratory distress.     Breath sounds: Normal breath sounds.   Abdominal:     General: Bowel sounds are normal. There is no distension.     Palpations: Abdomen is soft.     Tenderness: There is no abdominal tenderness.  Musculoskeletal:        General: Normal range of motion.     Cervical back: Neck supple.     Right lower leg: No edema.     Left lower leg: No edema.  Lymphadenopathy:     Cervical: No cervical adenopathy.  Skin:    General: Skin is warm and dry.  Neurological:     Mental Status: She is alert. Mental status is at baseline.  Psychiatric:        Mood and Affect: Mood normal.        ASSESSMENT/ PLAN:  TODAY  1. Type 2 diabetes mellitus without complications without long term current use of insulin: hgb a1c 8.3; is not adequately managed; will increase to januvia 100 mg daily will monitor her status.   Type 2 diabetes mellitus without complications without long term current use of insulin: is not well controlled hgb a1c 8.3 will increase januvia to 50 mg dai  MD is aware of resident's narcotic use and is in agreement with current plan of care. We will attempt to wean resident as appropriate.  Ok Edwards NP [facility]  Contact [phone] Monday through Friday 8am- 5pm  After hours call [phone]

## 2020-05-07 DIAGNOSIS — R296 Repeated falls: Secondary | ICD-10-CM | POA: Diagnosis not present

## 2020-05-07 DIAGNOSIS — R262 Difficulty in walking, not elsewhere classified: Secondary | ICD-10-CM | POA: Diagnosis not present

## 2020-05-07 DIAGNOSIS — M6281 Muscle weakness (generalized): Secondary | ICD-10-CM | POA: Diagnosis not present

## 2020-05-07 DIAGNOSIS — I5032 Chronic diastolic (congestive) heart failure: Secondary | ICD-10-CM | POA: Diagnosis not present

## 2020-05-08 DIAGNOSIS — I5032 Chronic diastolic (congestive) heart failure: Secondary | ICD-10-CM | POA: Diagnosis not present

## 2020-05-08 DIAGNOSIS — M6281 Muscle weakness (generalized): Secondary | ICD-10-CM | POA: Diagnosis not present

## 2020-05-08 DIAGNOSIS — R262 Difficulty in walking, not elsewhere classified: Secondary | ICD-10-CM | POA: Diagnosis not present

## 2020-05-08 DIAGNOSIS — R296 Repeated falls: Secondary | ICD-10-CM | POA: Diagnosis not present

## 2020-05-09 ENCOUNTER — Telehealth (INDEPENDENT_AMBULATORY_CARE_PROVIDER_SITE_OTHER): Payer: Self-pay

## 2020-05-09 DIAGNOSIS — R296 Repeated falls: Secondary | ICD-10-CM | POA: Diagnosis not present

## 2020-05-09 DIAGNOSIS — M6281 Muscle weakness (generalized): Secondary | ICD-10-CM | POA: Diagnosis not present

## 2020-05-09 DIAGNOSIS — I5032 Chronic diastolic (congestive) heart failure: Secondary | ICD-10-CM | POA: Diagnosis not present

## 2020-05-09 DIAGNOSIS — R262 Difficulty in walking, not elsewhere classified: Secondary | ICD-10-CM | POA: Diagnosis not present

## 2020-05-10 ENCOUNTER — Non-Acute Institutional Stay: Payer: Self-pay | Admitting: Adult Health

## 2020-05-10 ENCOUNTER — Encounter: Payer: Self-pay | Admitting: Adult Health

## 2020-05-10 DIAGNOSIS — M6281 Muscle weakness (generalized): Secondary | ICD-10-CM | POA: Diagnosis not present

## 2020-05-10 DIAGNOSIS — F329 Major depressive disorder, single episode, unspecified: Secondary | ICD-10-CM | POA: Diagnosis not present

## 2020-05-10 DIAGNOSIS — R296 Repeated falls: Secondary | ICD-10-CM | POA: Diagnosis not present

## 2020-05-10 DIAGNOSIS — I5032 Chronic diastolic (congestive) heart failure: Secondary | ICD-10-CM | POA: Diagnosis not present

## 2020-05-10 DIAGNOSIS — R262 Difficulty in walking, not elsewhere classified: Secondary | ICD-10-CM | POA: Diagnosis not present

## 2020-05-10 NOTE — Progress Notes (Signed)
Location:    [facility] Number: 149/W Place of Service:  SNF (31)   CODE STATUS: DNR  Allergies  Allergen Reactions  . Codeine   . Zantac [Ranitidine Hcl]     Chief Complaint  Patient presents with  . Acute Visit    Weight Loss    HPI:  She is losing weight. On 03-06-20 weight 132 pounds; on 04-17-20: 125 pounds; 04-30-20: 124.4 pounds; 05-10-20: 122.2 pounds. Her appetite is good. She is always in motion. She is unable to sit still; fidgets most of the time. There are no reports of pain; no worsening edema. No shortness of breath.   Past Medical History:  Diagnosis Date  . Abnormal uterine and vaginal bleeding, unspecified   . Allergic rhinitis due to pollen   . Anxiety   . Asthma   . Cancer [facility])    breast cancer - right  . Dementia ([place])   . Depression   . Diabetes mellitus without complication ([place])   . Essential (primary) hypertension   . Hyperlipidemia, unspecified   . Hypertension   . Major depressive disorder, single episode, unspecified   . Obesity, unspecified   . Pain in joint involving shoulder region   . Reflux esophagitis   . Trigger finger, acquired   . Type 2 diabetes mellitus with hyperglycemia ([place])   . Unspecified asthma with (acute) exacerbation   . 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 ([place])   . Unspecified osteoarthritis, unspecified site     Past Surgical History:  Procedure Laterality Date  . CHOLECYSTECTOMY    . MASTECTOMY Right   . RE-EXCISION OF BREAST LUMPECTOMY      Social History   Socioeconomic History  . Marital status: Married    Spouse name: Not on file  . Number of children: Not on file  . Years of education: Not on file  . Highest education level: Not on file  Occupational History  . Not on file  Tobacco Use  . Smoking status: Never Smoker  . Smokeless tobacco: Never Used  Substance and Sexual Activity  . Alcohol use: No  . Drug use: No  . Sexual activity: Not on file    Other Topics Concern  . Not on file  Social History Narrative  . Not on file   Social Determinants of Health   Financial Resource Strain:   . Difficulty of Paying Living Expenses: Not on file  Food Insecurity:   . Worried About Charity fundraiser in the Last Year: Not on file  . Ran Out of Food in the Last Year: Not on file  Transportation Needs:   . Lack of Transportation (Medical): Not on file  . Lack of Transportation (Non-Medical): Not on file  Physical Activity:   . Days of Exercise per Week: Not on file  . Minutes of Exercise per Session: Not on file  Stress:   . Feeling of Stress : Not on file  Social Connections:   . Frequency of Communication with Friends and Family: Not on file  . Frequency of Social Gatherings with Friends and Family: Not on file  . Attends Religious Services: Not on file  . Active Member of Clubs or Organizations: Not on file  . Attends Archivist Meetings: Not on file  . Marital Status: Not on file  Intimate Partner Violence:   . Fear of Current or Ex-Partner: Not on file  . Emotionally Abused: Not on file  . Physically Abused: Not  on file  . Sexually Abused: Not on file   Family History  Family history unknown: Yes      VITAL SIGNS BP (!) 174/76   Pulse 60   Temp 98.1 F (36.7 C) (Oral)   Resp 20   Ht 5\' 5"  (1.651 m)   Wt 124 lb 3.2 oz (56.3 kg)   SpO2 96%   BMI 20.67 kg/m   Outpatient Encounter Medications as of 05/10/2020  Medication Sig  . acetaminophen (TYLENOL) 325 MG tablet Take 2 tablets (650 mg total) by mouth every 6 (six) hours as needed for mild pain (or Fever >/= 101).  Marland Kitchen amiodarone (PACERONE) 200 MG tablet Take 200 mg by mouth daily.  Marland Kitchen amLODipine (NORVASC) 5 MG tablet Take 5 mg by mouth daily.  Marland Kitchen anastrozole (ARIMIDEX) 1 MG tablet Take 1 mg by mouth daily.  Marland Kitchen apixaban (ELIQUIS) 5 MG TABS tablet Take 5 mg by mouth 2 (two) times daily.  Roseanne Kaufman Peru-Castor Oil [facility]) OINT Special Instructions: Apply  to sacrum, bilateral buttocks and coccyx qshift for prevention. Every Shift Day, Evening, Night  . donepezil (ARICEPT) 5 MG tablet Take 5 mg by mouth at bedtime.  Marland Kitchen ipratropium-albuterol (DUONEB) 0.5-2.5 (3) MG/3ML SOLN Take 3 mLs by nebulization every 6 (six) hours.  . metoprolol tartrate (LOPRESSOR) 25 MG tablet Take 0.5 tablets (12.5 mg total) by mouth 2 (two) times daily.  . Multiple Vitamin (MULTIVITAMIN ADULT PO) Take 1 tablet by mouth daily.  . NON FORMULARY Diet Change: Regular with nectar thick liquids  . OXYGEN Inhale 3 L into the lungs continuous.  . simvastatin (ZOCOR) 40 MG tablet Take 20 mg by mouth every evening.   . sitaGLIPtin (JANUVIA) 100 MG tablet Take 100 mg by mouth daily.   No facility-administered encounter medications on file as of 05/10/2020.     SIGNIFICANT DIAGNOSTIC EXAMS  PREVIOUS  02-05-20: chest x-ray: Worsening patchy bilateral airspace disease compatible with multifocal pneumonia. Small right effusion.  02-06-20: ct of head:  1. Interval development of extensive bilateral upper lobe airspace opacities, right greater than left. Findings are concerning for multifocal pneumonia. 2. New small right pleural effusion and moderate left pleural effusion with overlying areas of compressive type atelectasis. 3. Aortic atherosclerosis, in addition to RCA and LAD coronary artery disease. Please note that although the presence of coronary artery calcium documents the presence of coronary artery disease, the severity of this disease and any potential stenosis cannot be assessed on this non-gated CT examination. Assessment for potential risk factor modification, dietary therapy or pharmacologic therapy may be warranted, if clinically indicated. Aortic Atherosclerosis   02-13-20: chest x-ray:  1. Multifocal bilateral pulmonary infiltrates and small bilateral pleural effusions again noted. Similar findings noted on prior exam. 2.  Stable cardiomegaly.  02-13-20: swallow  study: Recommend D3/mech soft and NTL   03-04-20: chest x-ray:  1. Cardiomegaly.  2. Right pleural effusion.   NO NEW EXAMS.    LABS REVIEWED PREVIOUS  12-16-19: hgb  a1c 6.8  02-05-20: wbc 8.5; hgb 9.8; hct 32.4; mcv 94.7plt 344 glucose 285; bun 20; creat 0.70; k+ 3.0; na++ 143; ca 8.3 mag 2.2 blood culture: no growth 02-06-20: vit B 12: 725; folate 6.2; ammonia 38; tsh 1.503; free t4: 1.24 02-08-20: wbc 7.6; hgb 10.1; hct 34.4; mcv 98.6 plt 299; glucose 113; bun 12; creat 0.64; k+ 3.2; na++ 146; ca 7.9  02-10-20: glucose 118; bun 19; creat 0.70; k+ 3.1; na++ 143; ca 7.9 liver normal albumin 2.1 02-14-20:  wbc 5.8; hgb 9.0; hct 29.7; mcv 94.9 plt 245; glucose 125; bun 27; creat 0.80; k+ 3.9; na++ 142; ca 8.5  02-29-20: glucose 121; bun 51; creat 1.75; k+ 4.5; na++ 137 ca 8.6   03-01-20: chol 123 LDL 56 trig 40 hdl 59 03-03-20: glucose 246; bun 42; creat 1.07 ;k+ 4.4; na++ 137; ca 9.1  03-07-20: glucose 158; bun 12; creat 0.72; k+ 35; na++ 139; ca 8.8 04-03-20: wbc 9.1; hgb 10.1; hct 31.7; mcv 91.4 plt 244; glucose 204; bun 37; creat 1.24; k+ 5.1; na++ 139; ca 9.2 liver normal albumin 3.7 chol 152; ldl 67; trig 70; hdl 71; hgb a1c 8.3 tsh 2.76  NO NEW LABS.    Review of Systems  Unable to perform ROS: Dementia (unable to participate)    Physical Exam Constitutional:      General: She is not in acute distress.    Appearance: She is well-developed. She is not diaphoretic.  Neck:     Thyroid: No thyromegaly.  Cardiovascular:     Rate and Rhythm: Normal rate and regular rhythm.     Pulses: Normal pulses.     Heart sounds: Normal heart sounds.  Pulmonary:     Effort: Pulmonary effort is normal. No respiratory distress.     Breath sounds: Normal breath sounds.  Abdominal:     General: Bowel sounds are normal. There is no distension.     Palpations: Abdomen is soft.     Tenderness: There is no abdominal tenderness.  Musculoskeletal:        General: Normal range of motion.     Cervical back:  Neck supple.     Right lower leg: No edema.     Left lower leg: No edema.  Lymphadenopathy:     Cervical: No cervical adenopathy.  Skin:    General: Skin is warm and dry.  Neurological:     Mental Status: She is alert. Mental status is at baseline.  Psychiatric:        Mood and Affect: Mood normal.       ASSESSMENT/ PLAN:  TODAY  1. Chronic diastolic congestive heart failure 2. Major depressive disorder with single episode unspecified remission  Will begin zoloft 25 mg daily  Will continue to monitor her status.     MD is aware of resident's narcotic use and is in agreement with current plan of care. We will attempt to wean resident as appropriate.  Ok Edwards NP [facility]  Contact [phone] Monday through Friday 8am- 5pm  After hours call [phone]

## 2020-05-15 ENCOUNTER — Ambulatory Visit (INDEPENDENT_AMBULATORY_CARE_PROVIDER_SITE_OTHER): Payer: Medicare Other | Admitting: Nurse Practitioner

## 2020-05-15 ENCOUNTER — Encounter: Payer: Self-pay | Admitting: Adult Health

## 2020-05-15 ENCOUNTER — Non-Acute Institutional Stay (SKILLED_NURSING_FACILITY): Payer: Medicare Other | Admitting: Adult Health

## 2020-05-15 DIAGNOSIS — Z0000 Encounter for general adult medical examination without abnormal findings: Secondary | ICD-10-CM | POA: Diagnosis not present

## 2020-05-15 NOTE — Patient Instructions (Signed)
  Amy Hoffman , Thank you for taking time to come for your Medicare Wellness Visit. I appreciate your ongoing commitment to your health goals. Please review the following plan we discussed and let me know if I can assist you in the future.   These are the goals we discussed: Goals    . DIET - INCREASE WATER INTAKE    . Follow up with Provider as scheduled    . General - Client will not be readmitted within 30 days (C-SNP)       This is a list of the screening recommended for you and due dates:  Health Maintenance  Topic Date Due  .  Hepatitis C: One time screening is recommended by Center for Disease Control  (CDC) for  adults born from [birth year] through 1966.   Never done  . Eye exam for diabetics  Never done  . Tetanus Vaccine  Never done  . Complete foot exam   01/14/2019  . Urine Protein Check  01/20/2019  . Flu Shot  04/14/2020  . Hemoglobin A1C  10/04/2020  . Pneumonia vaccines (2 of 2 - PCV13) 04/03/2021  . DEXA scan (bone density measurement)  Completed  . COVID-19 Vaccine  Completed

## 2020-05-15 NOTE — Progress Notes (Signed)
Subjective:   Amy Hoffman is a 79 y.o. female who presents for Medicare Annual (Subsequent) preventive examination.long term resident of [facility]   Review of Systems  Unable to perform ROS: Dementia (unable to participate )     Cardiac Risk Factors include: advanced age (>63men, >74 women);sedentary lifestyle;diabetes mellitus     Objective:    Today's Vitals   05/15/20 1152 05/15/20 1207  BP: (!) 161/52   Pulse: 68   Resp: 20   Temp: 98.2 F (36.8 C)   TempSrc: Oral   SpO2: 95%   Weight: 125 lb 12.8 oz (57.1 kg)   Height: 5\' 5"  (1.651 m)   PainSc:  0-No pain   Body mass index is 20.93 kg/m.  Advanced Directives 05/15/2020 05/10/2020 05/06/2020 04/22/2020 03/21/2020 03/20/2020 03/07/2020  Does Patient Have a Medical Advance Directive? Yes Yes Yes Yes Yes Yes Yes  Type of Advance Directive Out of facility DNR (pink MOST or yellow form) [place];Living will;Out of facility DNR (pink MOST or yellow form) [place];Living will;Out of facility DNR (pink MOST or yellow form) [place];Living will;Out of facility DNR (pink MOST or yellow form) [place];Living will;Out of facility DNR (pink MOST or yellow form) [place];Out of facility DNR (pink MOST or yellow form);Living will [place];Living will;Out of facility DNR (pink MOST or yellow form)  Does patient want to make changes to medical advance directive? No - Patient declined No - Patient declined No - Patient declined No - Patient declined No - Patient declined No - Patient declined No - Patient declined  Copy of [place] in Chart? Yes - validated most recent copy scanned in chart (See row information) Yes - validated most recent copy scanned in chart (See row information) Yes - validated most recent copy scanned in chart (See row information) Yes - validated most recent copy scanned in chart (See row  information) Yes - validated most recent copy scanned in chart (See row information) Yes - validated most recent copy scanned in chart (See row information) Yes - validated most recent copy scanned in chart (See row information)  Would patient like information on creating a medical advance directive? - - - - - - -  Pre-existing out of facility DNR order (yellow form or pink MOST form) - Yellow form placed in chart (order not valid for inpatient use) Yellow form placed in chart (order not valid for inpatient use) Yellow form placed in chart (order not valid for inpatient use) Yellow form placed in chart (order not valid for inpatient use) Yellow form placed in chart (order not valid for inpatient use) Yellow form placed in chart (order not valid for inpatient use)    Current Medications (verified) Outpatient Encounter Medications as of 05/15/2020  Medication Sig   acetaminophen (TYLENOL) 325 MG tablet Take 2 tablets (650 mg total) by mouth every 6 (six) hours as needed for mild pain (or Fever >/= 101).   Amino Acids-Protein Hydrolys (FEEDING SUPPLEMENT, PRO-STAT 64,) LIQD Take 30 mLs by mouth 2 (two) times daily between meals.   amiodarone (PACERONE) 200 MG tablet Take 200 mg by mouth daily.   amLODipine (NORVASC) 5 MG tablet Take 5 mg by mouth daily.   anastrozole (ARIMIDEX) 1 MG tablet Take 1 mg by mouth daily.   apixaban (ELIQUIS) 5 MG TABS tablet Take 5 mg by mouth 2 (two) times daily.   Balsam Peru-Castor Oil (  VENELEX) OINT Special Instructions: Apply to sacrum, bilateral buttocks and coccyx qshift for prevention. Every Shift Day, Evening, Night   donepezil (ARICEPT) 5 MG tablet Take 5 mg by mouth at bedtime.   ipratropium-albuterol (DUONEB) 0.5-2.5 (3) MG/3ML SOLN Take 3 mLs by nebulization every 6 (six) hours.   metoprolol tartrate (LOPRESSOR) 25 MG tablet Take 0.5 tablets (12.5 mg total) by mouth 2 (two) times daily.   mirtazapine (REMERON) 15 MG tablet Take 7.5 mg by mouth every  evening. For Appetite   Multiple Vitamin (MULTIVITAMIN ADULT PO) Take 1 tablet by mouth daily.   NON FORMULARY Diet Change: Regular with nectar thick liquids   NON FORMULARY Magic Cup qhs daily; Once A Day 08:00 PM   OXYGEN Inhale 3 L into the lungs continuous.   sertraline (ZOLOFT) 25 MG tablet Take 25 mg by mouth daily.   simvastatin (ZOCOR) 40 MG tablet Take 20 mg by mouth every evening.    sitaGLIPtin (JANUVIA) 100 MG tablet Take 100 mg by mouth daily.   No facility-administered encounter medications on file as of 05/15/2020.    Allergies (verified) Codeine and Zantac [ranitidine hcl]   History: Past Medical History:  Diagnosis Date   Abnormal uterine and vaginal bleeding, unspecified    Allergic rhinitis due to pollen    Anxiety    Asthma    Cancer ([facility])    breast cancer - right   Dementia ([place])    Depression    Diabetes mellitus without complication ([place])    Essential (primary) hypertension    Hyperlipidemia, unspecified    Hypertension    Major depressive disorder, single episode, unspecified    Obesity, unspecified    Pain in joint involving shoulder region    Reflux esophagitis    Trigger finger, acquired    Type 2 diabetes mellitus with hyperglycemia ([place])    Unspecified asthma with (acute) exacerbation    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 ([facility])    Unspecified osteoarthritis, unspecified site    Past Surgical History:  Procedure Laterality Date   CHOLECYSTECTOMY     MASTECTOMY Right    RE-EXCISION OF BREAST LUMPECTOMY     Family History  Family history unknown: Yes   Social History   Socioeconomic History   Marital status: Married    Spouse name: Not on file   Number of children: Not on file   Years of education: Not on file   Highest education level: Not on file  Occupational History   Occupation: retired   Tobacco Use   Smoking status: Never Smoker   Smokeless tobacco: Never Used  IT trainer Use: Never used  Substance and Sexual Activity   Alcohol use: No   Drug use: No   Sexual activity: Not Currently  Other Topics Concern   Not on file  Social History Narrative   Long term resident of [facility]    Social Determinants of Health   Financial Resource Strain:    Difficulty of Paying Living Expenses: Not on file  Food Insecurity:    Worried About Charity fundraiser in the Last Year: Not on file   [employer] of Food in the Last Year: Not on file  Transportation Needs:    Lack of Transportation (Medical): Not on file   Lack of Transportation (Non-Medical): Not on file  Physical Activity:    Days of Exercise per Week: Not on file   Minutes of Exercise per Session: Not on file  Stress:  Feeling of Stress : Not on file  Social Connections:    Frequency of Communication with Friends and Family: Not on file   Frequency of Social Gatherings with Friends and Family: Not on file   Attends Religious Services: Not on file   Active Member of Clubs or Organizations: Not on file   Attends Archivist Meetings: Not on file   Marital Status: Not on file    Tobacco Counseling Counseling given: Not Answered   Clinical Intake:  Pre-visit preparation completed: Yes  Pain : No/denies pain Pain Score: 0-No pain     BMI - recorded: 30.93 Nutritional Status: BMI > 30  Obese Diabetes: Yes CBG done?:  (per nursing staff)     Diabetic?yes  Interpreter Needed?: No      Activities of Daily Living In your present state of health, do you have any difficulty performing the following activities: 05/15/2020 02/06/2020  Hearing? N N  Vision? N N  Difficulty concentrating or making decisions? N Y  Comment - h/o dementia  Walking or climbing stairs? N Y  Dressing or bathing? N Y  Doing errands, shopping? N N  Preparing Food and eating ? Y -  Using the Toilet? Y -  In the past six months, have you accidently leaked urine? Y -  Do you have  problems with loss of bowel control? Y -  Managing your Medications? Y -  Managing your Finances? Y -  Housekeeping or managing your Housekeeping? Y -  Some recent data might be hidden    Patient Care Team: Gerlene Fee, NP as PCP - General ([place], [place] ([place])  Indicate any recent Medical Services you may have received from other than [facility] providers in the past year (date may be approximate).     Assessment:   This is a routine wellness examination for Amy Hoffman.  Hearing/Vision screen No exam data present  Dietary issues and exercise activities discussed: Current Exercise Habits: The patient does not participate in regular exercise at present  Goals     DIET - [place]     Follow up with Provider as scheduled     General - Client will not be readmitted within 30 days (C-SNP)      Depression Screen PHQ 2/9 Scores 05/15/2020  PHQ - 2 Score 0    Fall Risk Fall Risk  05/15/2020 08/09/2019 04/13/2019 05/12/2016  Falls in the past year? 1 0 (No Data) No  Comment - Emmi Telephone Survey: data to providers prior to load Emmi Telephone Survey: data to providers prior to load Emmi Telephone Survey: data to providers prior to load  Number falls in past yr: 1 - (No Data) -  Comment - - Emmi Telephone Survey Actual Response =  -  Injury with Fall? 1 - - -  Risk for fall due to : History of fall(s);Impaired balance/gait;Impaired mobility - - -  Follow up Falls evaluation completed - - -    Any stairs in or around the home? yes If so, are there any without handrails? n/a Home free of loose throw rugs in walkways, pet beds, electrical cords, etc? yes  Adequate lighting in your home to reduce risk of falls? Yes   ASSISTIVE DEVICES UTILIZED TO PREVENT FALLS:  Life alert? n/a Use of a cane, walker or w/c? w/c Grab bars in the bathroom? Yes  Shower chair or bench in shower? Yes  Elevated toilet seat or a handicapped  toilet?  Yes   TIMED UP AND GO:  Was the test performed? n/a.  Length of time to ambulate 10 feet n/a    Cognitive Function:        Immunizations Immunization History  Administered Date(s) Administered   Influenza, High Dose Seasonal PF 11/15/2017, 06/27/2018   Influenza,inj,Quad PF,6+ Mos 07/28/2016, 06/07/2019   Influenza-Unspecified 07/21/2012, 07/01/2013   Moderna SARS-COVID-2 Vaccination 11/03/2019, 12/01/2019   Pneumococcal Polysaccharide-23 04/03/2020     Qualifies for Shingles Vaccine? Yes  Screening Tests Health Maintenance  Topic Date Due   Hepatitis C Screening  Never done   OPHTHALMOLOGY EXAM  Never done   TETANUS/TDAP  Never done   FOOT EXAM  01/14/2019   URINE MICROALBUMIN  01/20/2019   INFLUENZA VACCINE  04/14/2020   HEMOGLOBIN A1C  10/04/2020   PNA vac Low Risk Adult (2 of 2 - PCV13) 04/03/2021   DEXA SCAN  Completed   COVID-19 Vaccine  Completed    Health Maintenance  Health Maintenance Due  Topic Date Due   Hepatitis C Screening  Never done   OPHTHALMOLOGY EXAM  Never done   TETANUS/TDAP  Never done   FOOT EXAM  01/14/2019   URINE MICROALBUMIN  01/20/2019   INFLUENZA VACCINE  04/14/2020     Lung Cancer Screening: (Low Dose CT Chest recommended if Age 83-80 years, 30 pack-year currently smoking OR have quit w/in 15years.) does not  qualify.   Lung Cancer Screening Referral: n/a  Additional Screening:  Hepatitis C Screening:  Vision Screening: Recommended annual ophthalmology exams for early detection of glaucoma and other disorders of the eye. Is the patient up to date with their annual eye exam?  yes    Dental Screening: Recommended annual dental exams for proper oral hygiene  Community Resource Referral / Chronic Care Management: CRR required this visit?  no  CCM required this visit?  no    Plan:     I have personally reviewed and noted the following in the patients chart:    Medical and social  history  Use of alcohol, tobacco or illicit drugs   Current medications and supplements  Functional ability and status  Nutritional status  Physical activity  Advanced directives  List of other physicians  Hospitalizations, surgeries, and ER visits in previous 12 months  Vitals  Screenings to include cognitive, depression, and falls  Referrals and appointments  In addition, I have reviewed and discussed with patient certain preventive protocols, quality metrics, and best practice recommendations. A written personalized care plan for preventive services as well as general preventive health recommendations were provided to patient.     Gerlene Fee, NP   05/15/2020

## 2020-05-17 ENCOUNTER — Encounter (HOSPITAL_COMMUNITY)
Admission: RE | Admit: 2020-05-17 | Discharge: 2020-05-17 | Disposition: A | Discharge status: Home / Self Care | Payer: Medicare Other | Source: Skilled Nursing Facility | Attending: Adult Health | Admitting: Adult Health

## 2020-05-17 DIAGNOSIS — N1832 Chronic kidney disease, stage 3b: Secondary | ICD-10-CM | POA: Diagnosis not present

## 2020-05-17 DIAGNOSIS — E1169 Type 2 diabetes mellitus with other specified complication: Secondary | ICD-10-CM | POA: Insufficient documentation

## 2020-05-17 LAB — BASIC METABOLIC PANEL
Anion gap: 8 (ref 5–15)
BUN: 41 mg/dL — ABNORMAL HIGH (ref 8–23)
CO2: 27 mmol/L (ref 22–32)
Calcium: 8.6 mg/dL — ABNORMAL LOW (ref 8.9–10.3)
Chloride: 101 mmol/L (ref 98–111)
Creatinine, Ser: 1.21 mg/dL — ABNORMAL HIGH (ref 0.44–1.00)
GFR calc Af Amer: 49 mL/min — ABNORMAL LOW (ref >60)
GFR calc non Af Amer: 43 mL/min — ABNORMAL LOW (ref >60)
Glucose, Bld: 223 mg/dL — ABNORMAL HIGH (ref 70–99)
Potassium: 5.3 mmol/L — ABNORMAL HIGH (ref 3.5–5.1)
Sodium: 136 mmol/L (ref 135–145)

## 2020-05-21 ENCOUNTER — Other Ambulatory Visit (HOSPITAL_COMMUNITY)
Admission: RE | Admit: 2020-05-21 | Discharge: 2020-05-21 | Disposition: A | Discharge status: Home / Self Care | Payer: Medicare Other | Source: Skilled Nursing Facility | Attending: Adult Health | Admitting: Adult Health

## 2020-05-21 DIAGNOSIS — I110 Hypertensive heart disease with heart failure: Secondary | ICD-10-CM | POA: Diagnosis not present

## 2020-05-21 LAB — BASIC METABOLIC PANEL
Anion gap: 12 (ref 5–15)
BUN: 18 mg/dL (ref 8–23)
CO2: 26 mmol/L (ref 22–32)
Calcium: 8.7 mg/dL — ABNORMAL LOW (ref 8.9–10.3)
Chloride: 103 mmol/L (ref 98–111)
Creatinine, Ser: 0.84 mg/dL (ref 0.44–1.00)
GFR calc Af Amer: >60 mL/min (ref >60)
GFR calc non Af Amer: >60 mL/min (ref >60)
Glucose, Bld: 151 mg/dL — ABNORMAL HIGH (ref 70–99)
Potassium: 3.9 mmol/L (ref 3.5–5.1)
Sodium: 141 mmol/L (ref 135–145)

## 2020-05-22 ENCOUNTER — Encounter: Payer: Self-pay | Admitting: Adult Health

## 2020-05-22 ENCOUNTER — Non-Acute Institutional Stay (SKILLED_NURSING_FACILITY): Payer: Medicare Other | Admitting: Adult Health

## 2020-05-22 DIAGNOSIS — E119 Type 2 diabetes mellitus without complications: Secondary | ICD-10-CM | POA: Diagnosis not present

## 2020-05-22 NOTE — Progress Notes (Addendum)
Location:    [place] Room Number: 149/W Place of Service:  SNF (31)   CODE STATUS: DNR  Allergies  Allergen Reactions  . Codeine   . Zantac [Ranitidine Hcl]     Chief Complaint  Patient presents with  . Follow-up    diabetes     HPI:  She is presently taking januvia 100 mg daily for her diabetes. She needs diabetic shoes to help with mobility while out of facility with family members. The shoes will prevent ulcerations. She does have less sensation on the left foot.   Past Medical History:  Diagnosis Date  . Abnormal uterine and vaginal bleeding, unspecified   . Allergic rhinitis due to pollen   . Anxiety   . Asthma   . Cancer [facility])    breast cancer - right  . Dementia ([place])   . Depression   . Diabetes mellitus without complication ([place])   . Essential (primary) hypertension   . Hyperlipidemia, unspecified   . Hypertension   . Major depressive disorder, single episode, unspecified   . Obesity, unspecified   . Pain in joint involving shoulder region   . Reflux esophagitis   . Trigger finger, acquired   . Type 2 diabetes mellitus with hyperglycemia ([place])   . Unspecified asthma with (acute) exacerbation   . 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 ([place])   . Unspecified osteoarthritis, unspecified site     Past Surgical History:  Procedure Laterality Date  . CHOLECYSTECTOMY    . MASTECTOMY Right   . RE-EXCISION OF BREAST LUMPECTOMY      Social History   Socioeconomic History  . Marital status: Married    Spouse name: Not on file  . Number of children: Not on file  . Years of education: Not on file  . Highest education level: Not on file  Occupational History  . Occupation: retired   Tobacco Use  . Smoking status: Never Smoker  . Smokeless tobacco: Never Used  Vaping Use  . Vaping Use: Never used  Substance and Sexual Activity  . Alcohol use: No  . Drug use: No  . Sexual activity: Not Currently  Other Topics  Concern  . Not on file  Social History Narrative   Long term resident of [facility]    Social Determinants of Health   Financial Resource Strain:   . Difficulty of Paying Living Expenses: Not on file  Food Insecurity:   . Worried About Charity fundraiser in the Last Year: Not on file  . Ran Out of Food in the Last Year: Not on file  Transportation Needs:   . Lack of Transportation (Medical): Not on file  . Lack of Transportation (Non-Medical): Not on file  Physical Activity:   . Days of Exercise per Week: Not on file  . Minutes of Exercise per Session: Not on file  Stress:   . Feeling of Stress : Not on file  Social Connections:   . Frequency of Communication with Friends and Family: Not on file  . Frequency of Social Gatherings with Friends and Family: Not on file  . Attends Religious Services: Not on file  . Active Member of Clubs or Organizations: Not on file  . Attends Archivist Meetings: Not on file  . Marital Status: Not on file  Intimate Partner Violence:   . Fear of Current or Ex-Partner: Not on file  . Emotionally Abused: Not on file  . Physically Abused: Not on file  .  Sexually Abused: Not on file   Family History  Family history unknown: Yes      VITAL SIGNS BP (!) 139/50   Pulse (!) 58   Temp 97.9 F (36.6 C) (Oral)   Resp 20   Ht 5\' 5"  (1.651 m)   Wt 118 lb 9.6 oz (53.8 kg)   SpO2 94%   BMI 19.74 kg/m   Outpatient Encounter Medications as of 05/22/2020  Medication Sig  . acetaminophen (TYLENOL) 325 MG tablet Take 2 tablets (650 mg total) by mouth every 6 (six) hours as needed for mild pain (or Fever >/= 101).  . Amino Acids-Protein Hydrolys (FEEDING SUPPLEMENT, PRO-STAT 64,) LIQD Take 30 mLs by mouth 2 (two) times daily between meals. To support wound healing and overall protein intake 09:00 AM, 05:00 PM  . amiodarone (PACERONE) 200 MG tablet Take 200 mg by mouth daily.  Marland Kitchen amLODipine (NORVASC) 5 MG tablet Take 5 mg by mouth daily.  Marland Kitchen  anastrozole (ARIMIDEX) 1 MG tablet Take 1 mg by mouth daily.  Marland Kitchen apixaban (ELIQUIS) 5 MG TABS tablet Take 5 mg by mouth 2 (two) times daily.  Roseanne Kaufman [place] [facility]) OINT Special Instructions: Apply to sacrum, bilateral buttocks and coccyx qshift for prevention. Every Shift Day, Evening, Night  . donepezil (ARICEPT) 5 MG tablet Take 5 mg by mouth at bedtime.  Marland Kitchen ipratropium-albuterol (DUONEB) 0.5-2.5 (3) MG/3ML SOLN Take 3 mLs by nebulization every 6 (six) hours.  . metoprolol tartrate (LOPRESSOR) 25 MG tablet Take 0.5 tablets (12.5 mg total) by mouth 2 (two) times daily.  . mirtazapine (REMERON) 15 MG tablet Take 7.5 mg by mouth every evening. For Appetite  . Multiple Vitamin (MULTIVITAMIN ADULT PO) Take 1 tablet by mouth daily.  . NON FORMULARY Diet Change: Regular with nectar thick liquids  . NON FORMULARY Magic Cup qhs daily; Once A Day 08:00 PM  . OXYGEN Inhale 3 L into the lungs continuous.  . sertraline (ZOLOFT) 25 MG tablet Take 25 mg by mouth daily.  . simvastatin (ZOCOR) 40 MG tablet Take 20 mg by mouth every evening.   . sitaGLIPtin (JANUVIA) 100 MG tablet Take 100 mg by mouth daily.  . [DISCONTINUED] Amino Acids-Protein Hydrolys (FEEDING SUPPLEMENT, PRO-STAT 64,) LIQD Take 30 mLs by mouth 2 (two) times daily between meals. Due to poor meal intake and increased needs due to wound healing 10:00 AM, 02:00 PM   No facility-administered encounter medications on file as of 05/22/2020.     SIGNIFICANT DIAGNOSTIC EXAMS   PREVIOUS  02-05-20: chest x-ray: Worsening patchy bilateral airspace disease compatible with multifocal pneumonia. Small right effusion.  02-06-20: ct of head:  1. Interval development of extensive bilateral upper lobe airspace opacities, right greater than left. Findings are concerning for multifocal pneumonia. 2. New small right pleural effusion and moderate left pleural effusion with overlying areas of compressive type atelectasis. 3. Aortic  atherosclerosis, in addition to RCA and LAD coronary artery disease. Please note that although the presence of coronary artery calcium documents the presence of coronary artery disease, the severity of this disease and any potential stenosis cannot be assessed on this non-gated CT examination. Assessment for potential risk factor modification, dietary therapy or pharmacologic therapy may be warranted, if clinically indicated. Aortic Atherosclerosis   02-13-20: chest x-ray:  1. Multifocal bilateral pulmonary infiltrates and small bilateral pleural effusions again noted. Similar findings noted on prior exam. 2.  Stable cardiomegaly.  02-13-20: swallow study: Recommend D3/mech soft and NTL   03-04-20: chest  x-ray:  1. Cardiomegaly.  2. Right pleural effusion.   NO NEW EXAMS.    LABS REVIEWED PREVIOUS  12-16-19: hgb  a1c 6.8  02-05-20: wbc 8.5; hgb 9.8; hct 32.4; mcv 94.7plt 344 glucose 285; bun 20; creat 0.70; k+ 3.0; na++ 143; ca 8.3 mag 2.2 blood culture: no growth 02-06-20: vit B 12: 725; folate 6.2; ammonia 38; tsh 1.503; free t4: 1.24 02-08-20: wbc 7.6; hgb 10.1; hct 34.4; mcv 98.6 plt 299; glucose 113; bun 12; creat 0.64; k+ 3.2; na++ 146; ca 7.9  02-10-20: glucose 118; bun 19; creat 0.70; k+ 3.1; na++ 143; ca 7.9 liver normal albumin 2.1 02-14-20: wbc 5.8; hgb 9.0; hct 29.7; mcv 94.9 plt 245; glucose 125; bun 27; creat 0.80; k+ 3.9; na++ 142; ca 8.5  02-29-20: glucose 121; bun 51; creat 1.75; k+ 4.5; na++ 137 ca 8.6   03-01-20: chol 123 LDL 56 trig 40 hdl 59 03-03-20: glucose 246; bun 42; creat 1.07 ;k+ 4.4; na++ 137; ca 9.1  03-07-20: glucose 158; bun 12; creat 0.72; k+ 35; na++ 139; ca 8.8 04-03-20: wbc 9.1; hgb 10.1; hct 31.7; mcv 91.4 plt 244; glucose 204; bun 37; creat 1.24; k+ 5.1; na++ 139; ca 9.2 liver normal albumin 3.7 chol 152; ldl 67; trig 70; hdl 71; hgb a1c 8.3 tsh 2.76  NO NEW LABS.   Review of Systems  Unable to perform ROS: Dementia (unable to participate )    Physical  Exam Constitutional:      General: She is not in acute distress.    Appearance: She is well-developed. She is not diaphoretic.  Neck:     Thyroid: No thyromegaly.  Cardiovascular:     Rate and Rhythm: Normal rate and regular rhythm.     Pulses: Normal pulses.     Heart sounds: Normal heart sounds.  Pulmonary:     Effort: Pulmonary effort is normal. No respiratory distress.     Breath sounds: Normal breath sounds.  Abdominal:     General: Bowel sounds are normal. There is no distension.     Palpations: Abdomen is soft.     Tenderness: There is no abdominal tenderness.  Musculoskeletal:        General: Normal range of motion.     Cervical back: Neck supple.     Right lower leg: No edema.     Left lower leg: No edema.  Lymphadenopathy:     Cervical: No cervical adenopathy.  Skin:    General: Skin is warm and dry.     Comments: Left heel with black eschar 2 x 3 cm.   Neurological:     Mental Status: She is alert. Mental status is at baseline.  Psychiatric:        Mood and Affect: Mood normal.       ASSESSMENT/ PLAN:  TODAY  1. Controlled type 2 diabetes mellitus without complication without current long term use of insulin.   Is stable will continue current medications; Her diabetic foot exam has been performed Will be fitted for her shoes.   MD is aware of resident's narcotic use and is in agreement with current plan of care. We will attempt to wean resident as appropriate.  Ok Edwards NP [facility]  Contact [phone] Monday through Friday 8am- 5pm  After hours call [phone]

## 2020-05-23 ENCOUNTER — Non-Acute Institutional Stay (SKILLED_NURSING_FACILITY): Payer: Medicare Other | Admitting: Adult Health

## 2020-05-23 ENCOUNTER — Encounter: Payer: Self-pay | Admitting: Adult Health

## 2020-05-23 DIAGNOSIS — I4811 Longstanding persistent atrial fibrillation: Secondary | ICD-10-CM | POA: Diagnosis not present

## 2020-05-23 DIAGNOSIS — R1312 Dysphagia, oropharyngeal phase: Secondary | ICD-10-CM

## 2020-05-23 DIAGNOSIS — F0391 Unspecified dementia with behavioral disturbance: Secondary | ICD-10-CM

## 2020-05-23 NOTE — Progress Notes (Signed)
Location:    [place] Room Number: 149/W Place of Service:  SNF (31)   CODE STATUS: DNR  Allergies  Allergen Reactions  . Codeine   . Zantac [Ranitidine Hcl]     Chief Complaint  Patient presents with  . Acute Visit    Care Plan Meeting    HPI:  We have come together for her care plan meeting. Family present. Unable to do BIMS; mood 2/30. She has had 4 falls without injury. Her weight is at 119 pounds; remains on nectar thick liquids. She is in supplements to help with her nutritional status in light heel ulceration. The goal is to stop her weight loss and possibly to regain some of her weight. She requires extensive assist with adls and is frequently incontinent of bladder and bowel. Her spouse would like for her to return back home. They have been given information for her needed 24 hour care. 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 Abnormal uterine and vaginal bleeding, unspecified   . Allergic rhinitis due to pollen   . Anxiety   . Asthma   . Cancer [facility])    breast cancer - right  . Dementia ([place])   . Depression   . Diabetes mellitus without complication ([place])   . Essential (primary) hypertension   . Hyperlipidemia, unspecified   . Hypertension   . Major depressive disorder, single episode, unspecified   . Obesity, unspecified   . Pain in joint involving shoulder region   . Reflux esophagitis   . Trigger finger, acquired   . Type 2 diabetes mellitus with hyperglycemia ([place])   . Unspecified asthma with (acute) exacerbation   . 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 ([place])   . Unspecified osteoarthritis, unspecified site     Past Surgical History:  Procedure Laterality Date  . CHOLECYSTECTOMY    . MASTECTOMY Right   .  RE-EXCISION OF BREAST LUMPECTOMY      Social History   Socioeconomic History  . Marital status: Married    Spouse name: Not on file  . Number of children: Not on file  . Years of education: Not on file  . Highest education level: Not on file  Occupational History  . Occupation: retired   Tobacco Use  . Smoking status: Never Smoker  . Smokeless tobacco: Never Used  Vaping Use  . Vaping Use: Never used  Substance and Sexual Activity  . Alcohol use: No  . Drug use: No  . Sexual activity: Not Currently  Other Topics Concern  . Not on file  Social History Narrative   Long term resident of [facility]    Social Determinants of Health   Financial Resource Strain:   . Difficulty of Paying Living Expenses: Not on file  Food Insecurity:   . Worried About Charity fundraiser in the Last Year: Not on file  . Ran Out of Food in the Last Year: Not on file  Transportation Needs:   . Lack of Transportation (Medical): Not on file  . Lack of Transportation (Non-Medical): Not on file  Physical Activity:   . Days of Exercise per Week: Not on file  . Minutes of Exercise per Session: Not on file  Stress:   . Feeling of Stress : Not on file  Social Connections:   . Frequency  of Communication with Friends and Family: Not on file  . Frequency of Social Gatherings with Friends and Family: Not on file  . Attends Religious Services: Not on file  . Active Member of Clubs or Organizations: Not on file  . Attends Archivist Meetings: Not on file  . Marital Status: Not on file  Intimate Partner Violence:   . Fear of Current or Ex-Partner: Not on file  . Emotionally Abused: Not on file  . Physically Abused: Not on file  . Sexually Abused: Not on file   Family History  Family history unknown: Yes      VITAL SIGNS BP (!) 139/50   Pulse 62   Temp (!) 97.1 F (36.2 C) (Oral)   Resp 20   Ht 5\' 5"  (1.651 m)   Wt 120 lb (54.4 kg)   SpO2 94%   BMI 19.97 kg/m   Outpatient Encounter  Medications as of 05/23/2020  Medication Sig  . acetaminophen (TYLENOL) 325 MG tablet Take 2 tablets (650 mg total) by mouth every 6 (six) hours as needed for mild pain (or Fever >/= 101).  . Amino Acids-Protein Hydrolys (FEEDING SUPPLEMENT, PRO-STAT 64,) LIQD Take 30 mLs by mouth 2 (two) times daily between meals. To support wound healing and overall protein intake 09:00 AM, 05:00 PM  . amiodarone (PACERONE) 200 MG tablet Take 200 mg by mouth daily.  Marland Kitchen amLODipine (NORVASC) 5 MG tablet Take 5 mg by mouth daily.  Marland Kitchen anastrozole (ARIMIDEX) 1 MG tablet Take 1 mg by mouth daily.  Marland Kitchen apixaban (ELIQUIS) 5 MG TABS tablet Take 5 mg by mouth 2 (two) times daily.  Roseanne Kaufman [place] [facility]) OINT Special Instructions: Apply to sacrum, bilateral buttocks and coccyx qshift for prevention. Every Shift Day, Evening, Night  . donepezil (ARICEPT) 5 MG tablet Take 5 mg by mouth at bedtime.  Marland Kitchen ipratropium-albuterol (DUONEB) 0.5-2.5 (3) MG/3ML SOLN Take 3 mLs by nebulization every 6 (six) hours.  . metoprolol tartrate (LOPRESSOR) 25 MG tablet Take 0.5 tablets (12.5 mg total) by mouth 2 (two) times daily.  . mirtazapine (REMERON) 15 MG tablet Take 7.5 mg by mouth every evening. For Appetite  . Multiple Vitamin (MULTIVITAMIN ADULT PO) Take 1 tablet by mouth daily.  . NON FORMULARY Diet Change: Regular with nectar thick liquids  . NON FORMULARY Magic Cup qhs daily; Once A Day 08:00 PM  . OXYGEN Inhale 3 L into the lungs continuous.  . sertraline (ZOLOFT) 25 MG tablet Take 25 mg by mouth daily.  . simvastatin (ZOCOR) 40 MG tablet Take 20 mg by mouth every evening.   . sitaGLIPtin (JANUVIA) 100 MG tablet Take 100 mg by mouth daily.  . [DISCONTINUED] Amino Acids-Protein Hydrolys (FEEDING SUPPLEMENT, PRO-STAT 64,) LIQD Take 30 mLs by mouth 2 (two) times daily between meals. Due to poor meal intake and increased needs due to wound healing 10:00 AM, 02:00 PM   No facility-administered encounter medications on file  as of 05/23/2020.     SIGNIFICANT DIAGNOSTIC EXAMS   PREVIOUS  02-05-20: chest x-ray: Worsening patchy bilateral airspace disease compatible with multifocal pneumonia. Small right effusion.  02-06-20: ct of head:  1. Interval development of extensive bilateral upper lobe airspace opacities, right greater than left. Findings are concerning for multifocal pneumonia. 2. New small right pleural effusion and moderate left pleural effusion with overlying areas of compressive type atelectasis. 3. Aortic atherosclerosis, in addition to RCA and LAD coronary artery disease. Please note that although the presence of  coronary artery calcium documents the presence of coronary artery disease, the severity of this disease and any potential stenosis cannot be assessed on this non-gated CT examination. Assessment for potential risk factor modification, dietary therapy or pharmacologic therapy may be warranted, if clinically indicated. Aortic Atherosclerosis   02-13-20: chest x-ray:  1. Multifocal bilateral pulmonary infiltrates and small bilateral pleural effusions again noted. Similar findings noted on prior exam. 2.  Stable cardiomegaly.  02-13-20: swallow study: Recommend D3/mech soft and NTL   03-04-20: chest x-ray:  1. Cardiomegaly.  2. Right pleural effusion.   NO NEW EXAMS.    LABS REVIEWED PREVIOUS  12-16-19: hgb  a1c 6.8  02-05-20: wbc 8.5; hgb 9.8; hct 32.4; mcv 94.7plt 344 glucose 285; bun 20; creat 0.70; k+ 3.0; na++ 143; ca 8.3 mag 2.2 blood culture: no growth 02-06-20: vit B 12: 725; folate 6.2; ammonia 38; tsh 1.503; free t4: 1.24 02-08-20: wbc 7.6; hgb 10.1; hct 34.4; mcv 98.6 plt 299; glucose 113; bun 12; creat 0.64; k+ 3.2; na++ 146; ca 7.9  02-10-20: glucose 118; bun 19; creat 0.70; k+ 3.1; na++ 143; ca 7.9 liver normal albumin 2.1 02-14-20: wbc 5.8; hgb 9.0; hct 29.7; mcv 94.9 plt 245; glucose 125; bun 27; creat 0.80; k+ 3.9; na++ 142; ca 8.5  02-29-20: glucose 121; bun 51; creat 1.75; k+  4.5; na++ 137 ca 8.6   03-01-20: chol 123 LDL 56 trig 40 hdl 59 03-03-20: glucose 246; bun 42; creat 1.07 ;k+ 4.4; na++ 137; ca 9.1  03-07-20: glucose 158; bun 12; creat 0.72; k+ 35; na++ 139; ca 8.8 04-03-20: wbc 9.1; hgb 10.1; hct 31.7; mcv 91.4 plt 244; glucose 204; bun 37; creat 1.24; k+ 5.1; na++ 139; ca 9.2 liver normal albumin 3.7 chol 152; ldl 67; trig 70; hdl 71; hgb a1c 8.3 tsh 2.76  NO NEW LABS.   Review of Systems  Unable to perform ROS: Dementia (unable to participate )    Physical Exam Constitutional:      General: She is not in acute distress.    Appearance: She is well-developed. She is not diaphoretic.  Neck:     Thyroid: No thyromegaly.  Cardiovascular:     Rate and Rhythm: Normal rate and regular rhythm.     Heart sounds: Normal heart sounds.  Pulmonary:     Effort: Pulmonary effort is normal. No respiratory distress.     Breath sounds: Normal breath sounds.  Abdominal:     General: Bowel sounds are normal. There is no distension.     Palpations: Abdomen is soft.     Tenderness: There is no abdominal tenderness.  Musculoskeletal:        General: Normal range of motion.     Cervical back: Neck supple.  Lymphadenopathy:     Cervical: No cervical adenopathy.  Skin:    General: Skin is warm and dry.  Neurological:     Mental Status: She is alert and oriented to person, place, and time.        ASSESSMENT/ PLAN:  TODAY  1. 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 Oropharyngeal phase dysphagia 3.  Longstanding persistent atrial fibrillation  Will continue current medications Will continue current plan of care Will continue to monitor her status.      MD is aware of resident's narcotic use and is in agreement with current plan of care. We will attempt to wean resident as appropriate.  Ok Edwards NP [facility]  Contact [phone] Monday through Friday 8am-  5pm  After hours call [phone]

## 2020-05-27 ENCOUNTER — Encounter: Payer: Self-pay | Admitting: Adult Health

## 2020-05-27 ENCOUNTER — Non-Acute Institutional Stay (SKILLED_NURSING_FACILITY): Payer: Medicare Other | Admitting: Adult Health

## 2020-05-27 DIAGNOSIS — E119 Type 2 diabetes mellitus without complications: Secondary | ICD-10-CM | POA: Diagnosis not present

## 2020-05-27 DIAGNOSIS — I5032 Chronic diastolic (congestive) heart failure: Secondary | ICD-10-CM | POA: Diagnosis not present

## 2020-05-27 DIAGNOSIS — I110 Hypertensive heart disease with heart failure: Secondary | ICD-10-CM | POA: Diagnosis not present

## 2020-05-27 DIAGNOSIS — R1312 Dysphagia, oropharyngeal phase: Secondary | ICD-10-CM

## 2020-05-27 NOTE — Progress Notes (Signed)
Location:    [place] Room Number: 149/W Place of Service:  SNF (31)   CODE STATUS: DNR  Allergies  Allergen Reactions  . Codeine   . Zantac [Ranitidine Hcl]     Chief Complaint  Patient presents with  . Medical Management of Chronic Issues            Hypertensive heart disease with chronic diastolic congestive heart failure:   Oropharyngeal dysphagia:  Type 2 diabetes mellitus without complications without long term current use of insulin     HPI:  She is a 79 year old long term resident of this facility being seen for the management of her chronic illnesses:  Hypertensive heart disease with chronic diastolic congestive heart failure:   Oropharyngeal dysphagia:  Type 2 diabetes mellitus without complications without long term current use of insulin . There are no reports of uncontrolled pain; no reports of agitation. There are no reports of cough or shortness of breath.   Past Medical History:  Diagnosis Date  . Abnormal uterine and vaginal bleeding, unspecified   . Allergic rhinitis due to pollen   . Anxiety   . Asthma   . Cancer [facility])    breast cancer - right  . Dementia ([place])   . Depression   . Diabetes mellitus without complication ([place])   . Essential (primary) hypertension   . Hyperlipidemia, unspecified   . Hypertension   . Major depressive disorder, single episode, unspecified   . Obesity, unspecified   . Pain in joint involving shoulder region   . Reflux esophagitis   . Trigger finger, acquired   . Type 2 diabetes mellitus with hyperglycemia ([place])   . Unspecified asthma with (acute) exacerbation   . 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 ([place])   . Unspecified osteoarthritis, unspecified site     Past Surgical History:  Procedure Laterality Date  . CHOLECYSTECTOMY    . MASTECTOMY Right   . RE-EXCISION OF BREAST LUMPECTOMY      Social History   Socioeconomic History  . Marital status: Married    Spouse name: Not  on file  . Number of children: Not on file  . Years of education: Not on file  . Highest education level: Not on file  Occupational History  . Occupation: retired   Tobacco Use  . Smoking status: Never Smoker  . Smokeless tobacco: Never Used  Vaping Use  . Vaping Use: Never used  Substance and Sexual Activity  . Alcohol use: No  . Drug use: No  . Sexual activity: Not Currently  Other Topics Concern  . Not on file  Social History Narrative   Long term resident of [facility]    Social Determinants of Health   Financial Resource Strain:   . Difficulty of Paying Living Expenses: Not on file  Food Insecurity:   . Worried About Charity fundraiser in the Last Year: Not on file  . Ran Out of Food in the Last Year: Not on file  Transportation Needs:   . Lack of Transportation (Medical): Not on file  . Lack of Transportation (Non-Medical): Not on file  Physical Activity:   . Days of Exercise per Week: Not on file  . Minutes of Exercise per Session: Not on file  Stress:   . Feeling of Stress : Not on file  Social Connections:   . Frequency of Communication with Friends and Family: Not on file  . Frequency of Social Gatherings with Friends and Family:  Not on file  . Attends Religious Services: Not on file  . Active Member of Clubs or Organizations: Not on file  . Attends Archivist Meetings: Not on file  . Marital Status: Not on file  Intimate Partner Violence:   . Fear of Current or Ex-Partner: Not on file  . Emotionally Abused: Not on file  . Physically Abused: Not on file  . Sexually Abused: Not on file   Family History  Family history unknown: Yes      VITAL SIGNS BP 134/62   Pulse (!) 56   Temp 98.3 F (36.8 C) (Oral)   Resp 20   Ht 5\' 5"  (1.651 m)   Wt 124 lb 9.6 oz (56.5 kg)   SpO2 94%   BMI 20.73 kg/m   Outpatient Encounter Medications as of 05/27/2020  Medication Sig  . acetaminophen (TYLENOL) 325 MG tablet Take 2 tablets (650 mg total) by mouth  every 6 (six) hours as needed for mild pain (or Fever >/= 101).  . Amino Acids-Protein Hydrolys (FEEDING SUPPLEMENT, PRO-STAT 64,) LIQD Take 30 mLs by mouth 2 (two) times daily between meals. To support wound healing and overall protein intake 09:00 AM, 05:00 PM  . amiodarone (PACERONE) 200 MG tablet Take 200 mg by mouth daily.  Marland Kitchen amLODipine (NORVASC) 5 MG tablet Take 5 mg by mouth daily.  Marland Kitchen anastrozole (ARIMIDEX) 1 MG tablet Take 1 mg by mouth daily.  Marland Kitchen apixaban (ELIQUIS) 5 MG TABS tablet Take 5 mg by mouth 2 (two) times daily.  Roseanne Kaufman [place]-Castor Oil [facility]) OINT Special Instructions: Apply to sacrum, bilateral buttocks and coccyx qshift for prevention. Every Shift Day, Evening, Night  . donepezil (ARICEPT) 5 MG tablet Take 5 mg by mouth at bedtime.  Marland Kitchen ipratropium-albuterol (DUONEB) 0.5-2.5 (3) MG/3ML SOLN Take 3 mLs by nebulization every 6 (six) hours prn  . metoprolol tartrate (LOPRESSOR) 25 MG tablet Take 0.5 tablets (12.5 mg total) by mouth 2 (two) times daily.  . mirtazapine (REMERON) 15 MG tablet Take 7.5 mg by mouth every evening. For Appetite  . Multiple Vitamin (MULTIVITAMIN ADULT PO) Take 1 tablet by mouth daily.  . NON FORMULARY Diet Change: Regular with nectar thick liquids  . NON FORMULARY Magic Cup qhs daily; Once A Day 08:00 PM  . OXYGEN Inhale 3 L into the lungs continuous.  . sertraline (ZOLOFT) 25 MG tablet Take 25 mg by mouth daily.  . simvastatin (ZOCOR) 40 MG tablet Take 20 mg by mouth every evening.   . sitaGLIPtin (JANUVIA) 100 MG tablet Take 100 mg by mouth daily.   No facility-administered encounter medications on file as of 05/27/2020.     SIGNIFICANT DIAGNOSTIC EXAMS  PREVIOUS  02-05-20: chest x-ray: Worsening patchy bilateral airspace disease compatible with multifocal pneumonia. Small right effusion.  02-06-20: ct of head:  1. Interval development of extensive bilateral upper lobe airspace opacities, right greater than left. Findings are concerning  for multifocal pneumonia. 2. New small right pleural effusion and moderate left pleural effusion with overlying areas of compressive type atelectasis. 3. Aortic atherosclerosis, in addition to RCA and LAD coronary artery disease. Please note that although the presence of coronary artery calcium documents the presence of coronary artery disease, the severity of this disease and any potential stenosis cannot be assessed on this non-gated CT examination. Assessment for potential risk factor modification, dietary therapy or pharmacologic therapy may be warranted, if clinically indicated. Aortic Atherosclerosis   02-13-20: chest x-ray:  1. Multifocal bilateral pulmonary  infiltrates and small bilateral pleural effusions again noted. Similar findings noted on prior exam. 2.  Stable cardiomegaly.  02-13-20: swallow study: Recommend D3/mech soft and NTL   03-04-20: chest x-ray:  1. Cardiomegaly.  2. Right pleural effusion.   NO NEW EXAMS.    LABS REVIEWED PREVIOUS  12-16-19: hgb  a1c 6.8  02-05-20: wbc 8.5; hgb 9.8; hct 32.4; mcv 94.7plt 344 glucose 285; bun 20; creat 0.70; k+ 3.0; na++ 143; ca 8.3 mag 2.2 blood culture: no growth 02-06-20: vit B 12: 725; folate 6.2; ammonia 38; tsh 1.503; free t4: 1.24 02-08-20: wbc 7.6; hgb 10.1; hct 34.4; mcv 98.6 plt 299; glucose 113; bun 12; creat 0.64; k+ 3.2; na++ 146; ca 7.9  02-10-20: glucose 118; bun 19; creat 0.70; k+ 3.1; na++ 143; ca 7.9 liver normal albumin 2.1 02-14-20: wbc 5.8; hgb 9.0; hct 29.7; mcv 94.9 plt 245; glucose 125; bun 27; creat 0.80; k+ 3.9; na++ 142; ca 8.5  02-29-20: glucose 121; bun 51; creat 1.75; k+ 4.5; na++ 137 ca 8.6   03-01-20: chol 123 LDL 56 trig 40 hdl 59 03-03-20: glucose 246; bun 42; creat 1.07 ;k+ 4.4; na++ 137; ca 9.1  03-07-20: glucose 158; bun 12; creat 0.72; k+ 35; na++ 139; ca 8.8 04-03-20: wbc 9.1; hgb 10.1; hct 31.7; mcv 91.4 plt 244; glucose 204; bun 37; creat 1.24; k+ 5.1; na++ 139; ca 9.2 liver normal albumin 3.7 chol 152;  ldl 67; trig 70; hdl 71; hgb a1c 8.3 tsh 2.76  TODAY  05-17-20: glucose 223; bun 41; creat 1.21; k+ 5.3; na++ 136; ca 8.6 05-21-20: glucose 151; bun 18; creat 0.84; k+ 3.9; na++ 141; ca 8.7     Review of Systems  Unable to perform ROS: Dementia (unable to participate )    Physical Exam Constitutional:      General: She is not in acute distress.    Appearance: She is well-developed. She is not diaphoretic.  Neck:     Thyroid: No thyromegaly.  Cardiovascular:     Rate and Rhythm: Normal rate and regular rhythm.     Pulses: Normal pulses.     Heart sounds: Normal heart sounds.  Pulmonary:     Effort: Pulmonary effort is normal. No respiratory distress.     Breath sounds: Normal breath sounds.  Abdominal:     General: Bowel sounds are normal. There is no distension.     Palpations: Abdomen is soft.     Tenderness: There is no abdominal tenderness.  Musculoskeletal:        General: Normal range of motion.     Cervical back: Neck supple.     Right lower leg: No edema.     Left lower leg: No edema.  Lymphadenopathy:     Cervical: No cervical adenopathy.  Skin:    General: Skin is warm and dry.  Neurological:     Mental Status: She is alert. Mental status is at baseline.  Psychiatric:        Mood and Affect: Mood normal.       ASSESSMENT/ PLAN:  TODAY  1. Hypertensive heart disease with chronic diastolic congestive heart failure: is stable b/p 134/62 will continue norvasc 5 mg daily lopressor 12.5 mg twice daily  2. Oropharyngeal dysphagia: is stable without indications of aspiration; will continue nectar thick liquids.   3. Type 2 diabetes mellitus without complications without long term current use of insulin hgb a1c 8.3 will continue januvia 100 mg daily   PREVIOUS   4.  Dementia without behavioral disturbance unspecified dementia type: is stable weight is 124 pounds will continue aricept 5 mg daily is presently taking remeron 7.5 mg for total of 30 days for appetite;  will monitor   5 Malignant neoplasm of female breast unspecified estrogen receptor status unspecified site of breast: is stable will continue arimidex 1 mg daily  6. Hyperlipidemia associated with type 2 diabetes mellitus: is stable LDL 67 will continue zocor 20 mg daily   7. 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 Does have episodes of agitation; will monitor   8. Chronic constipation: is stable will monitor    9. Acute diastolic CHF (congestive heart failure) is stable EF 50-55% (12-17-19) is compensated will continue lopressor 12.5 mg twice daily .   10. Longstanding persistent atrial fibrillation: heart rate is stable will continue amiodarone 200 mg daily lopressor 12.5 mg twice daily for rate control and eliquis 5 mg twice daily     MD is aware of resident's narcotic use and is in agreement with current plan of care. We will attempt to wean resident as appropriate.  Ok Edwards NP [facility]  Contact [phone] Monday through Friday 8am- 5pm  After hours call [phone]

## 2020-05-30 ENCOUNTER — Other Ambulatory Visit (HOSPITAL_COMMUNITY)
Admission: RE | Admit: 2020-05-30 | Discharge: 2020-05-30 | Disposition: A | Discharge status: Home / Self Care | Payer: Medicare Other | Source: Skilled Nursing Facility | Attending: Adult Health | Admitting: Adult Health

## 2020-05-30 DIAGNOSIS — E1169 Type 2 diabetes mellitus with other specified complication: Secondary | ICD-10-CM | POA: Diagnosis not present

## 2020-05-30 LAB — HEPATITIS C ANTIBODY: HCV Ab: NONREACTIVE

## 2020-05-31 ENCOUNTER — Other Ambulatory Visit (HOSPITAL_COMMUNITY)
Admission: RE | Admit: 2020-05-31 | Discharge: 2020-05-31 | Disposition: A | Discharge status: Home / Self Care | Payer: Medicare Other | Source: Skilled Nursing Facility | Attending: Adult Health | Admitting: Adult Health

## 2020-05-31 DIAGNOSIS — E1169 Type 2 diabetes mellitus with other specified complication: Secondary | ICD-10-CM | POA: Diagnosis not present

## 2020-05-31 LAB — MICROALBUMIN, URINE: Microalb, Ur: 33.5 ug/mL — ABNORMAL HIGH

## 2020-06-01 LAB — MICROALBUMIN, URINE: Microalb, Ur: 40.1 ug/mL — ABNORMAL HIGH

## 2020-06-07 ENCOUNTER — Encounter: Payer: Self-pay | Admitting: Adult Health

## 2020-06-07 ENCOUNTER — Non-Acute Institutional Stay (SKILLED_NURSING_FACILITY): Payer: Medicare Other | Admitting: Adult Health

## 2020-06-07 DIAGNOSIS — F321 Major depressive disorder, single episode, moderate: Secondary | ICD-10-CM

## 2020-06-07 NOTE — Progress Notes (Signed)
Location:    [place] Room Number: 149-W Place of Service:  SNF (31)   CODE STATUS: DNR  Allergies  Allergen Reactions  . Codeine   . Zantac [Ranitidine Hcl]     Chief Complaint  Patient presents with  . Acute Visit    Anxiety and agitation     HPI:  She had recently been started on zoloft 25 mg daily for anxiety. She has now started yelling out for no apparent reason. She denies any pain. There are no reports of changes in her urine. She does tell me that she feels anxious and nervous   Past Medical History:  Diagnosis Date  . Abnormal uterine and vaginal bleeding, unspecified   . Allergic rhinitis due to pollen   . Anxiety   . Asthma   . Cancer [facility])    breast cancer - right  . Dementia ([place])   . Depression   . Diabetes mellitus without complication ([place])   . Essential (primary) hypertension   . Hyperlipidemia, unspecified   . Hypertension   . Major depressive disorder, single episode, unspecified   . Obesity, unspecified   . Pain in joint involving shoulder region   . Reflux esophagitis   . Trigger finger, acquired   . Type 2 diabetes mellitus with hyperglycemia ([place])   . Unspecified asthma with (acute) exacerbation   . 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 ([place])   . Unspecified osteoarthritis, unspecified site     Past Surgical History:  Procedure Laterality Date  . CHOLECYSTECTOMY    . MASTECTOMY Right   . RE-EXCISION OF BREAST LUMPECTOMY      Social History   Socioeconomic History  . Marital status: Married    Spouse name: Not on file  . Number of children: Not on file  . Years of education: Not on file  . Highest education level: Not on file  Occupational History  . Occupation: retired   Tobacco Use  . Smoking status: Never Smoker  . Smokeless tobacco: Never Used  Vaping Use  . Vaping Use: Never used  Substance and Sexual Activity  . Alcohol use: No  . Drug use: No  . Sexual activity: Not Currently   Other Topics Concern  . Not on file  Social History Narrative   Long term resident of [facility]    Social Determinants of Health   Financial Resource Strain:   . Difficulty of Paying Living Expenses: Not on file  Food Insecurity:   . Worried About Charity fundraiser in the Last Year: Not on file  . Ran Out of Food in the Last Year: Not on file  Transportation Needs:   . Lack of Transportation (Medical): Not on file  . Lack of Transportation (Non-Medical): Not on file  Physical Activity:   . Days of Exercise per Week: Not on file  . Minutes of Exercise per Session: Not on file  Stress:   . Feeling of Stress : Not on file  Social Connections:   . Frequency of Communication with Friends and Family: Not on file  . Frequency of Social Gatherings with Friends and Family: Not on file  . Attends Religious Services: Not on file  . Active Member of Clubs or Organizations: Not on file  . Attends Archivist Meetings: Not on file  . Marital Status: Not on file  Intimate Partner Violence:   . Fear of Current or Ex-Partner: Not on file  . Emotionally Abused: Not on file  .  Physically Abused: Not on file  . Sexually Abused: Not on file   Family History  Family history unknown: Yes      VITAL SIGNS There were no vitals taken for this visit.  Outpatient Encounter Medications as of 06/07/2020  Medication Sig  . acetaminophen (TYLENOL) 325 MG tablet Take 2 tablets (650 mg total) by mouth every 6 (six) hours as needed for mild pain (or Fever >/= 101).  . Amino Acids-Protein Hydrolys (FEEDING SUPPLEMENT, PRO-STAT 64,) LIQD Take 30 mLs by mouth 2 (two) times daily between meals. To support wound healing and overall protein intake 09:00 AM, 05:00 PM  . amiodarone (PACERONE) 200 MG tablet Take 200 mg by mouth daily.  Marland Kitchen amLODipine (NORVASC) 5 MG tablet Take 5 mg by mouth daily.  Marland Kitchen anastrozole (ARIMIDEX) 1 MG tablet Take 1 mg by mouth daily.  Marland Kitchen apixaban (ELIQUIS) 5 MG TABS tablet Take 5  mg by mouth 2 (two) times daily.  Roseanne Kaufman [place]-Castor [employer] [facility]) OINT Special Instructions: Apply to sacrum, bilateral buttocks and coccyx qshift for prevention. Every Shift Day, Evening, Night  . busPIRone (BUSPAR) 5 MG tablet Take 5 mg by mouth 2 (two) times daily.  Marland Kitchen donepezil (ARICEPT) 5 MG tablet Take 5 mg by mouth at bedtime.  Marland Kitchen ipratropium-albuterol (DUONEB) 0.5-2.5 (3) MG/3ML SOLN Take 3 mLs by nebulization every 6 (six) hours as needed.   . metoprolol tartrate (LOPRESSOR) 25 MG tablet Take 0.5 tablets (12.5 mg total) by mouth 2 (two) times daily.  . mirtazapine (REMERON) 15 MG tablet Take 7.5 mg by mouth every evening. For Appetite  . Multiple Vitamin (MULTIVITAMIN ADULT PO) Take 1 tablet by mouth daily.  . NON FORMULARY Diet Change: Regular with nectar thick liquids  . NON FORMULARY Magic Cup qhs daily; Once A Day 08:00 PM  . OXYGEN Inhale 3 L into the lungs continuous.  . simvastatin (ZOCOR) 20 MG tablet Take 20 mg by mouth daily.  . sitaGLIPtin (JANUVIA) 100 MG tablet Take 100 mg by mouth daily.  . [DISCONTINUED] sertraline (ZOLOFT) 25 MG tablet Take 25 mg by mouth daily.  . [DISCONTINUED] simvastatin (ZOCOR) 40 MG tablet Take 20 mg by mouth every evening.    No facility-administered encounter medications on file as of 06/07/2020.     SIGNIFICANT DIAGNOSTIC EXAMS   PREVIOUS  02-05-20: chest x-ray: Worsening patchy bilateral airspace disease compatible with multifocal pneumonia. Small right effusion.  02-06-20: ct of head:  1. Interval development of extensive bilateral upper lobe airspace opacities, right greater than left. Findings are concerning for multifocal pneumonia. 2. New small right pleural effusion and moderate left pleural effusion with overlying areas of compressive type atelectasis. 3. Aortic atherosclerosis, in addition to RCA and LAD coronary artery disease. Please note that although the presence of coronary artery calcium documents the presence of  coronary artery disease, the severity of this disease and any potential stenosis cannot be assessed on this non-gated CT examination. Assessment for potential risk factor modification, dietary therapy or pharmacologic therapy may be warranted, if clinically indicated. Aortic Atherosclerosis   02-13-20: chest x-ray:  1. Multifocal bilateral pulmonary infiltrates and small bilateral pleural effusions again noted. Similar findings noted on prior exam. 2.  Stable cardiomegaly.  02-13-20: swallow study: Recommend D3/mech soft and NTL   03-04-20: chest x-ray:  1. Cardiomegaly.  2. Right pleural effusion.   NO NEW EXAMS.    LABS REVIEWED PREVIOUS  12-16-19: hgb  a1c 6.8  02-05-20: wbc 8.5; hgb 9.8; hct 32.4; mcv  94.7plt 344 glucose 285; bun 20; creat 0.70; k+ 3.0; na++ 143; ca 8.3 mag 2.2 blood culture: no growth 02-06-20: vit B 12: 725; folate 6.2; ammonia 38; tsh 1.503; free t4: 1.24 02-08-20: wbc 7.6; hgb 10.1; hct 34.4; mcv 98.6 plt 299; glucose 113; bun 12; creat 0.64; k+ 3.2; na++ 146; ca 7.9  02-10-20: glucose 118; bun 19; creat 0.70; k+ 3.1; na++ 143; ca 7.9 liver normal albumin 2.1 02-14-20: wbc 5.8; hgb 9.0; hct 29.7; mcv 94.9 plt 245; glucose 125; bun 27; creat 0.80; k+ 3.9; na++ 142; ca 8.5  02-29-20: glucose 121; bun 51; creat 1.75; k+ 4.5; na++ 137 ca 8.6   03-01-20: chol 123 LDL 56 trig 40 hdl 59 03-03-20: glucose 246; bun 42; creat 1.07 ;k+ 4.4; na++ 137; ca 9.1  03-07-20: glucose 158; bun 12; creat 0.72; k+ 35; na++ 139; ca 8.8 04-03-20: wbc 9.1; hgb 10.1; hct 31.7; mcv 91.4 plt 244; glucose 204; bun 37; creat 1.24; k+ 5.1; na++ 139; ca 9.2 liver normal albumin 3.7 chol 152; ldl 67; trig 70; hdl 71; hgb a1c 8.3 tsh 2.76 05-17-20: glucose 223; bun 41; creat 1.21; k+ 5.3; na++ 136; ca 8.6 05-21-20: glucose 151; bun 18; creat 0.84; k+ 3.9; na++ 141; ca 8.7    NO NEW LABS.   Review of Systems  Unable to perform ROS: Dementia (unable to participate )    Physical Exam Constitutional:       General: She is not in acute distress.    Appearance: She is well-developed. She is not diaphoretic.  Neck:     Thyroid: No thyromegaly.  Cardiovascular:     Rate and Rhythm: Normal rate and regular rhythm.     Pulses: Normal pulses.     Heart sounds: Normal heart sounds.  Pulmonary:     Effort: Pulmonary effort is normal. No respiratory distress.     Breath sounds: Normal breath sounds.  Abdominal:     General: Bowel sounds are normal. There is no distension.     Palpations: Abdomen is soft.     Tenderness: There is no abdominal tenderness.  Musculoskeletal:        General: Normal range of motion.     Cervical back: Neck supple.     Right lower leg: No edema.     Left lower leg: No edema.  Lymphadenopathy:     Cervical: No cervical adenopathy.  Skin:    General: Skin is warm and dry.  Neurological:     Mental Status: She is alert. Mental status is at baseline.  Psychiatric:        Mood and Affect: Mood normal.        ASSESSMENT/ PLAN:  TODAY  1. Current moderate episode of major depressive disorder without prior episode Her anxiety is worse Will stop zoloft as this medication could be contributing to her anxiety Will begin buspar 5 mg twice daily  Will monitor her status.    MD is aware of resident's narcotic use and is in agreement with current plan of care. We will attempt to wean resident as appropriate.  Ok Edwards NP [facility]  Contact [phone] Monday through Friday 8am- 5pm  After hours call [phone]

## 2020-06-13 ENCOUNTER — Encounter: Payer: Self-pay | Admitting: Adult Health

## 2020-06-13 ENCOUNTER — Non-Acute Institutional Stay (SKILLED_NURSING_FACILITY): Payer: Medicare Other | Admitting: Adult Health

## 2020-06-13 DIAGNOSIS — F482 Pseudobulbar affect: Secondary | ICD-10-CM | POA: Diagnosis not present

## 2020-06-13 IMAGING — RF DG ESOPHAGUS
11 series · 15 of 24 positions shown · non-contrast
Comparison: None

CLINICAL DATA: Weight loss, solid-food dysphagia

EXAM:
ESOPHOGRAM / BARIUM SWALLOW / BARIUM TABLET STUDY
TECHNIQUE: Combined double contrast and single contrast examination performed
using effervescent crystals, thick barium liquid, and thin barium
liquid. The patient was observed with fluoroscopy swallowing a 13 mm
barium sulphate tablet.
FLUOROSCOPY TIME:  Fluoroscopy Time:  1 minutes 30 seconds
Radiation Exposure Index (if provided by the fluoroscopic device):
26.3 mGy
Number of Acquired Spot Images: multiple fluoroscopic screen
captures

[Series 1: cp_standard · 0.17mm/px · 1 of 57 frames shown (1 of 11)]
[frame 9/57]
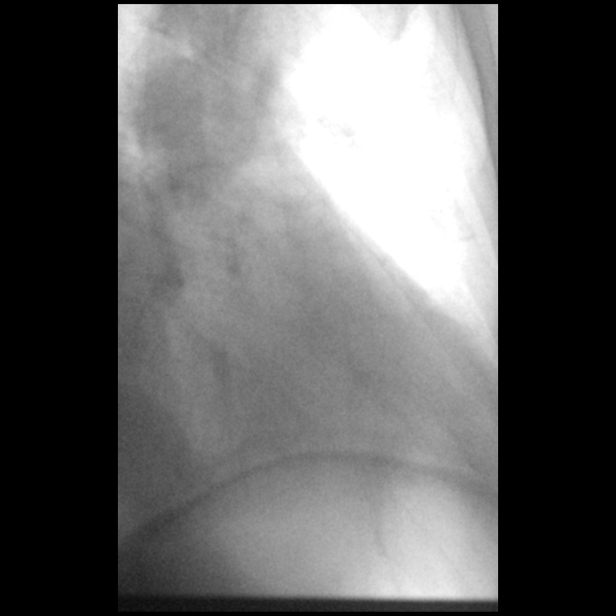

[Series 2: cp_standard · 0.17mm/px · 1 of 45 frames shown (2 of 11)]
[frame 7/45]
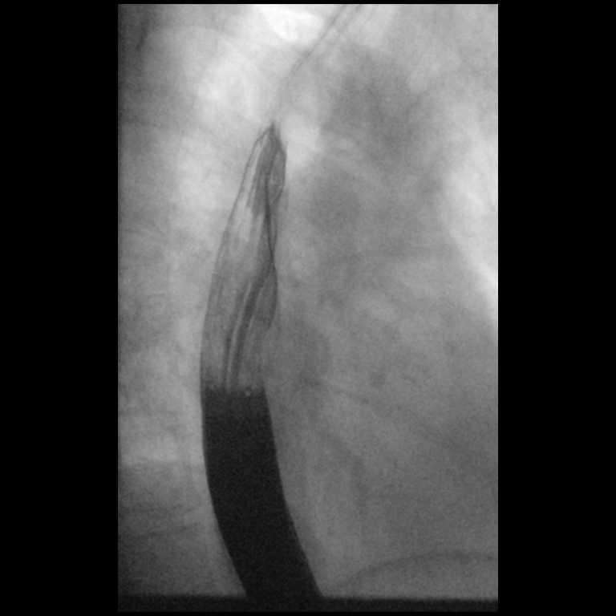

[Series 3: cp_standard · 0.17mm/px · 2 of 106 frames shown (3 of 11)]
[frame 16/106]
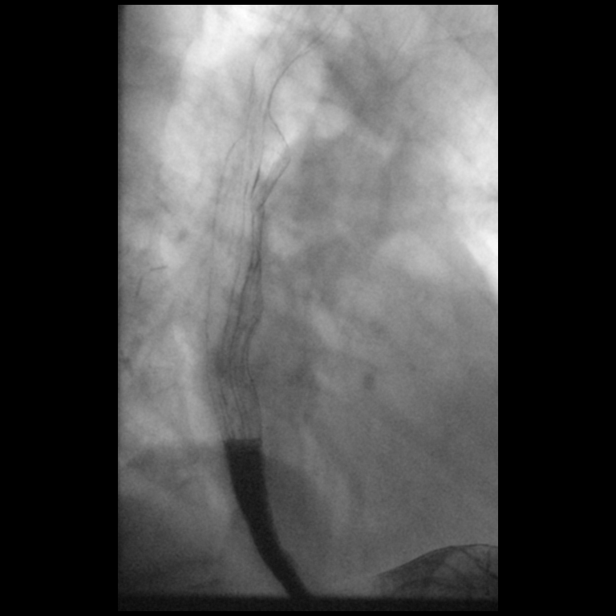
[frame 54/106]
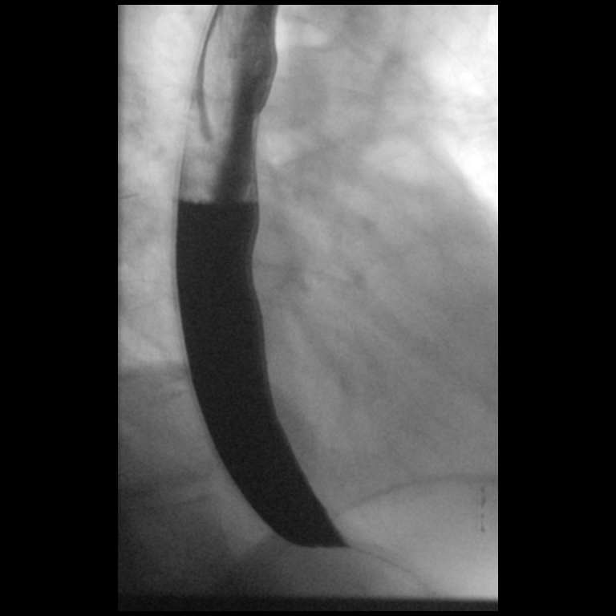

[Series 4: cp_standard · 0.17mm/px · 1 of 65 frames shown (4 of 11)]
[frame 33/65]
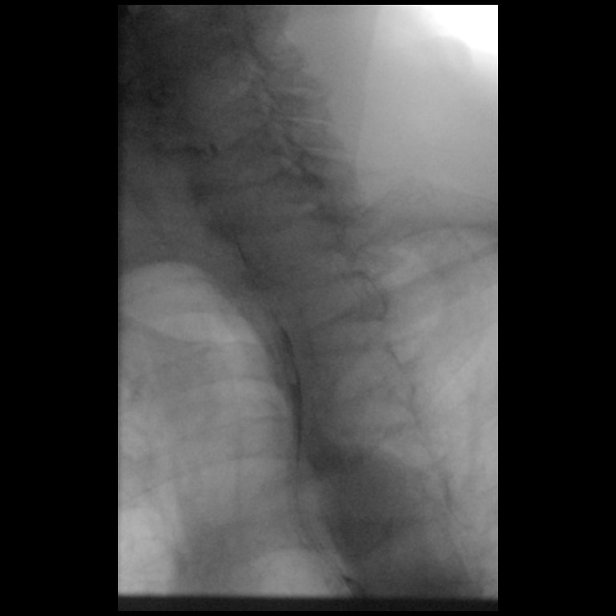

[Series 5: cp_standard · 0.17mm/px · 1 of 64 frames shown (5 of 11)]
[frame 1/64]
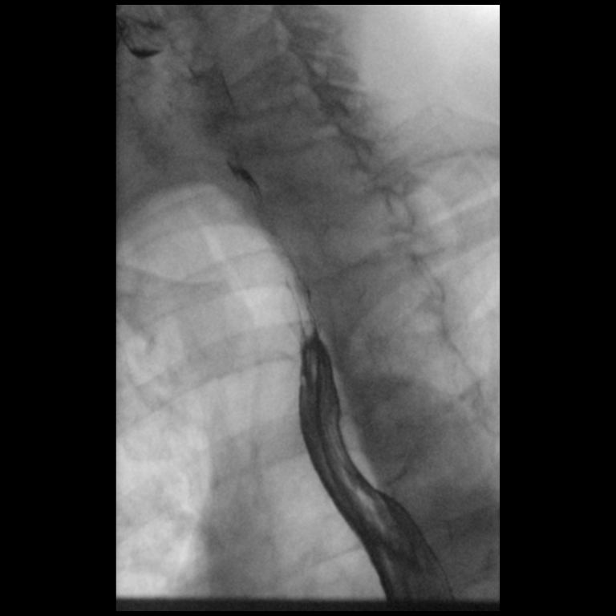

[Series 6: cp_standard · 0.27mm/px · 2 of 218 frames shown (6 of 11)]
[frame 30/218]
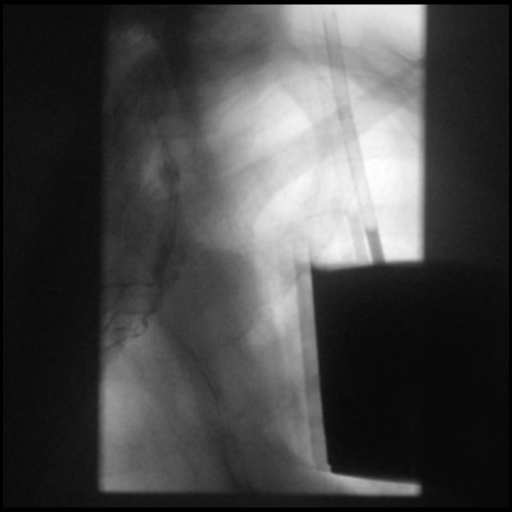
[frame 186/218]
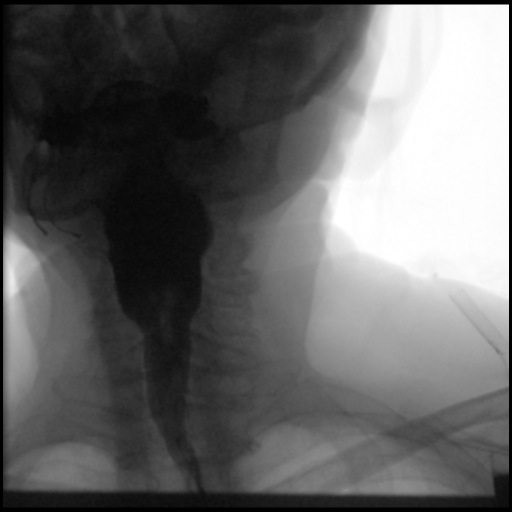

[Series 7: cp_standard · 0.26mm/px · 1 of 254 frames shown (7 of 11)]
[frame 119/254]
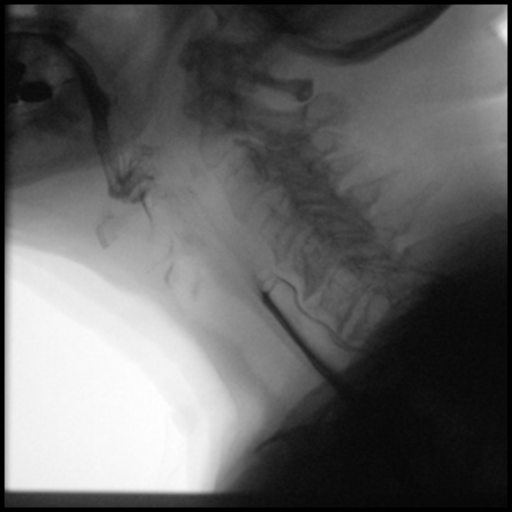

[Series 8: cp_standard · 0.27mm/px · 2 acquisitions, 2 frames shown (8 of 11)]
[im 1/2]
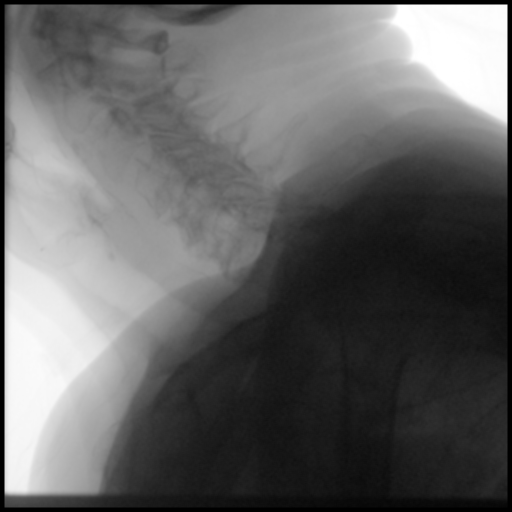
[im 1/2]
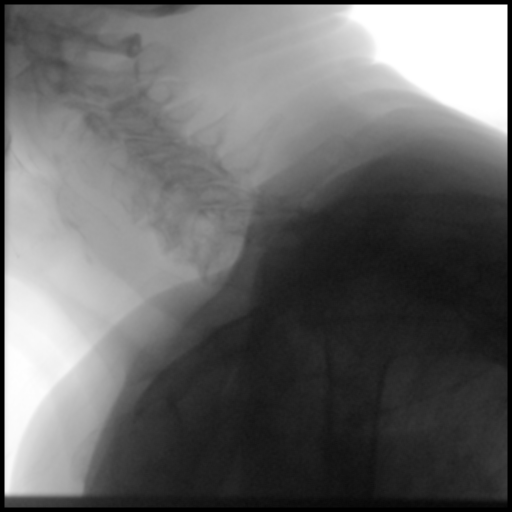

[Series 10: cp_standard · 0.18mm/px · 1 of 163 frames shown (9 of 11)]
[frame 82/163]
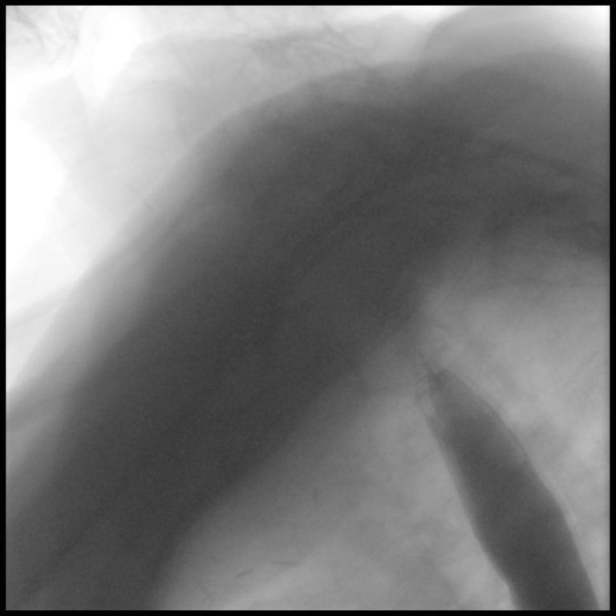

[Series 11: cp_standard · 0.17mm/px · 2 of 61 frames shown (10 of 11)]
[frame 10/61]
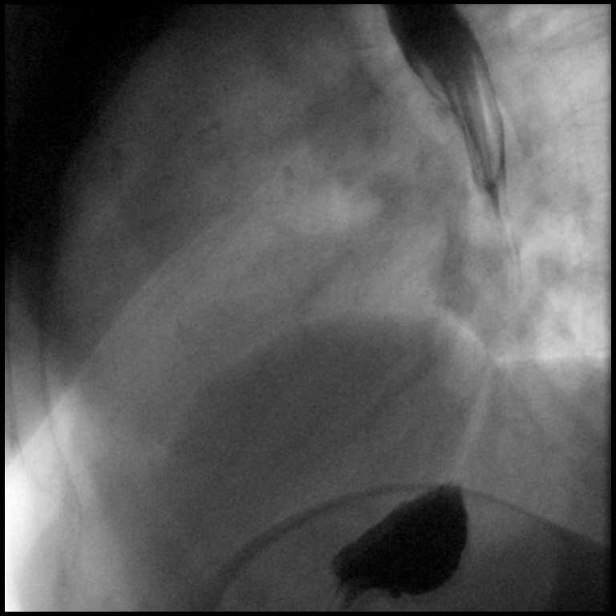
[frame 52/61]
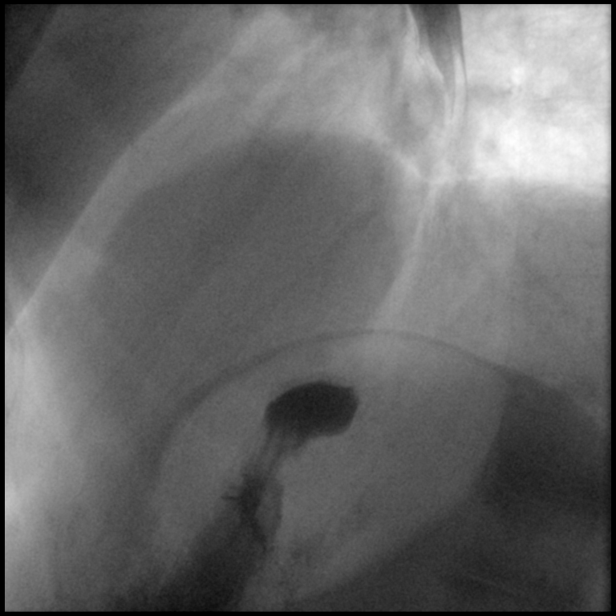

[Series 12: cp_standard · 0.17mm/px · 1 of 148 frames shown (11 of 11)]
[frame 126/148]
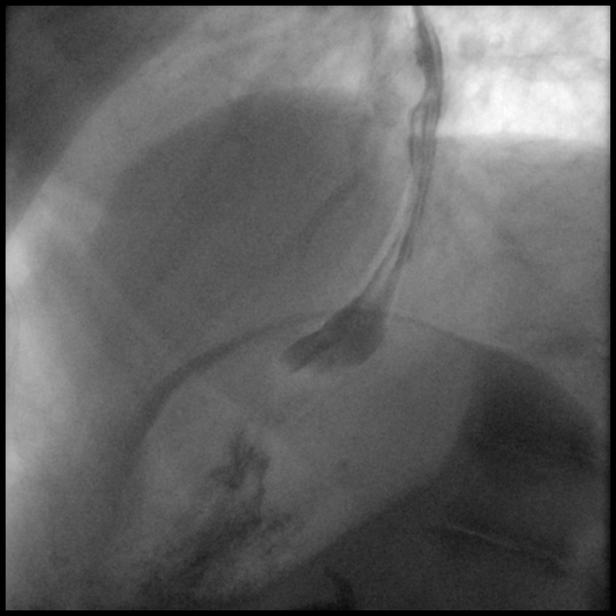

[15 of 24 positions shown; findings below may reference images not displayed]

FINDINGS: Esophageal distention: Normal without mass or stricture

Filling defects:  None

12.5 mm barium tablet: Passed from oral cavity to stomach without
delay.

Motility:  Diffuse age-related impairment of esophageal motility

Mucosa:  Smooth without irregularity or ulceration

Hypopharynx/cervical esophagus: Laryngeal penetration and silent
aspiration of a small amount of contrast into the proximal trachea.
No spontaneous cough. Majority cleared by voluntary coughing.

Hiatal hernia:  Absent

GE reflux:  Not witnessed during exam

Other:  N/A
IMPRESSION: Laryngeal penetration and silent aspiration of a small amount of
contrast.

Age-related esophageal dysmotility.

No evidence of esophageal mass or stricture.

## 2020-06-13 NOTE — Progress Notes (Signed)
Location:    [place] Room Number: 149/W Place of Service:  SNF (31)   CODE STATUS: DNR  Allergies  Allergen Reactions  . Codeine   . Zantac [Ranitidine Hcl]     Chief Complaint  Patient presents with  . Acute Visit    Mood Status    HPI:  She is yelling out for no reason; yelling help me help me. When asking her what she needs; she states nothing. She does not realize that she is yelling out. She is restless when she is doing this behavior and it does interfere with her quality of life.   Past Medical History:  Diagnosis Date  . Abnormal uterine and vaginal bleeding, unspecified   . Allergic rhinitis due to pollen   . Anxiety   . Asthma   . Cancer [facility])    breast cancer - right  . Dementia ([place])   . Depression   . Diabetes mellitus without complication ([place])   . Essential (primary) hypertension   . Hyperlipidemia, unspecified   . Hypertension   . Major depressive disorder, single episode, unspecified   . Obesity, unspecified   . Pain in joint involving shoulder region   . Reflux esophagitis   . Trigger finger, acquired   . Type 2 diabetes mellitus with hyperglycemia ([place])   . Unspecified asthma with (acute) exacerbation   . 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 ([place])   . Unspecified osteoarthritis, unspecified site     Past Surgical History:  Procedure Laterality Date  . CHOLECYSTECTOMY    . MASTECTOMY Right   . RE-EXCISION OF BREAST LUMPECTOMY      Social History   Socioeconomic History  . Marital status: Married    Spouse name: Not on file  . Number of children: Not on file  . Years of education: Not on file  . Highest education level: Not on file  Occupational History  . Occupation: retired   Tobacco Use  . Smoking status: Never Smoker  . Smokeless tobacco: Never Used  Vaping Use  . Vaping Use: Never used  Substance and Sexual Activity  . Alcohol use: No  . Drug use: No  . Sexual activity: Not  Currently  Other Topics Concern  . Not on file  Social History Narrative   Long term resident of [facility]    Social Determinants of Health   Financial Resource Strain:   . Difficulty of Paying Living Expenses: Not on file  Food Insecurity:   . Worried About Charity fundraiser in the Last Year: Not on file  . Ran Out of Food in the Last Year: Not on file  Transportation Needs:   . Lack of Transportation (Medical): Not on file  . Lack of Transportation (Non-Medical): Not on file  Physical Activity:   . Days of Exercise per Week: Not on file  . Minutes of Exercise per Session: Not on file  Stress:   . Feeling of Stress : Not on file  Social Connections:   . Frequency of Communication with Friends and Family: Not on file  . Frequency of Social Gatherings with Friends and Family: Not on file  . Attends Religious Services: Not on file  . Active Member of Clubs or Organizations: Not on file  . Attends Archivist Meetings: Not on file  . Marital Status: Not on file  Intimate Partner Violence:   . Fear of Current or Ex-Partner: Not on file  . Emotionally Abused: Not on  file  . Physically Abused: Not on file  . Sexually Abused: Not on file   Family History  Family history unknown: Yes      VITAL SIGNS BP (!) 154/63   Pulse 72   Temp 98.3 F (36.8 C)   Ht 5\' 5"  (1.651 m)   Wt 130 lb 12.8 oz (59.3 kg)   SpO2 94%   BMI 21.77 kg/m   Outpatient Encounter Medications as of 06/13/2020  Medication Sig  . acetaminophen (TYLENOL) 325 MG tablet Take 2 tablets (650 mg total) by mouth every 6 (six) hours as needed for mild pain (or Fever >/= 101).  . Amino Acids-Protein Hydrolys (FEEDING SUPPLEMENT, PRO-STAT 64,) LIQD Take 30 mLs by mouth 2 (two) times daily between meals. To support wound healing and overall protein intake 09:00 AM, 05:00 PM  . amiodarone (PACERONE) 200 MG tablet Take 200 mg by mouth daily.  Marland Kitchen amLODipine (NORVASC) 5 MG tablet Take 5 mg by mouth daily.  Marland Kitchen  anastrozole (ARIMIDEX) 1 MG tablet Take 1 mg by mouth daily.  Marland Kitchen apixaban (ELIQUIS) 5 MG TABS tablet Take 5 mg by mouth 2 (two) times daily.  Roseanne Kaufman [place] [facility]) OINT Special Instructions: Apply to sacrum, bilateral buttocks and coccyx qshift for prevention. Every Shift Day, Evening, Night  . busPIRone (BUSPAR) 5 MG tablet Take 5 mg by mouth 2 (two) times daily.  Marland Kitchen donepezil (ARICEPT) 5 MG tablet Take 5 mg by mouth at bedtime.  Marland Kitchen ipratropium-albuterol (DUONEB) 0.5-2.5 (3) MG/3ML SOLN Take 3 mLs by nebulization every 6 (six) hours as needed.   . metoprolol tartrate (LOPRESSOR) 25 MG tablet Take 0.5 tablets (12.5 mg total) by mouth 2 (two) times daily.  . Multiple Vitamin (MULTIVITAMIN ADULT PO) Take 1 tablet by mouth daily.  . NON FORMULARY Diet Change: Regular with nectar thick liquids  . NON FORMULARY Magic Cup qhs daily; Once A Day 08:00 PM  . OXYGEN Inhale 3 L into the lungs continuous.  . simvastatin (ZOCOR) 20 MG tablet Take 20 mg by mouth daily.  . sitaGLIPtin (JANUVIA) 100 MG tablet Take 100 mg by mouth daily.  . [DISCONTINUED] mirtazapine (REMERON) 15 MG tablet Take 7.5 mg by mouth every evening. For Appetite   No facility-administered encounter medications on file as of 06/13/2020.     SIGNIFICANT DIAGNOSTIC EXAMS   PREVIOUS  02-05-20: chest x-ray: Worsening patchy bilateral airspace disease compatible with multifocal pneumonia. Small right effusion.  02-06-20: ct of head:  1. Interval development of extensive bilateral upper lobe airspace opacities, right greater than left. Findings are concerning for multifocal pneumonia. 2. New small right pleural effusion and moderate left pleural effusion with overlying areas of compressive type atelectasis. 3. Aortic atherosclerosis, in addition to RCA and LAD coronary artery disease. Please note that although the presence of coronary artery calcium documents the presence of coronary artery disease, the severity of this  disease and any potential stenosis cannot be assessed on this non-gated CT examination. Assessment for potential risk factor modification, dietary therapy or pharmacologic therapy may be warranted, if clinically indicated. Aortic Atherosclerosis   02-13-20: chest x-ray:  1. Multifocal bilateral pulmonary infiltrates and small bilateral pleural effusions again noted. Similar findings noted on prior exam. 2.  Stable cardiomegaly.  02-13-20: swallow study: Recommend D3/mech soft and NTL   03-04-20: chest x-ray:  1. Cardiomegaly.  2. Right pleural effusion.   NO NEW EXAMS.    LABS REVIEWED PREVIOUS  12-16-19: hgb  a1c 6.8  02-05-20: wbc  8.5; hgb 9.8; hct 32.4; mcv 94.7plt 344 glucose 285; bun 20; creat 0.70; k+ 3.0; na++ 143; ca 8.3 mag 2.2 blood culture: no growth 02-06-20: vit B 12: 725; folate 6.2; ammonia 38; tsh 1.503; free t4: 1.24 02-08-20: wbc 7.6; hgb 10.1; hct 34.4; mcv 98.6 plt 299; glucose 113; bun 12; creat 0.64; k+ 3.2; na++ 146; ca 7.9  02-10-20: glucose 118; bun 19; creat 0.70; k+ 3.1; na++ 143; ca 7.9 liver normal albumin 2.1 02-14-20: wbc 5.8; hgb 9.0; hct 29.7; mcv 94.9 plt 245; glucose 125; bun 27; creat 0.80; k+ 3.9; na++ 142; ca 8.5  02-29-20: glucose 121; bun 51; creat 1.75; k+ 4.5; na++ 137 ca 8.6   03-01-20: chol 123 LDL 56 trig 40 hdl 59 03-03-20: glucose 246; bun 42; creat 1.07 ;k+ 4.4; na++ 137; ca 9.1  03-07-20: glucose 158; bun 12; creat 0.72; k+ 35; na++ 139; ca 8.8 04-03-20: wbc 9.1; hgb 10.1; hct 31.7; mcv 91.4 plt 244; glucose 204; bun 37; creat 1.24; k+ 5.1; na++ 139; ca 9.2 liver normal albumin 3.7 chol 152; ldl 67; trig 70; hdl 71; hgb a1c 8.3 tsh 2.76 05-17-20: glucose 223; bun 41; creat 1.21; k+ 5.3; na++ 136; ca 8.6 05-21-20: glucose 151; bun 18; creat 0.84; k+ 3.9; na++ 141; ca 8.7    NO NEW LABS.   Review of Systems  Unable to perform ROS: Dementia (unable to participate )    Physical Exam Constitutional:      General: She is not in acute distress.     Appearance: She is well-developed. She is not diaphoretic.  Neck:     Thyroid: No thyromegaly.  Cardiovascular:     Rate and Rhythm: Normal rate and regular rhythm.     Pulses: Normal pulses.     Heart sounds: Normal heart sounds.  Pulmonary:     Effort: Pulmonary effort is normal. No respiratory distress.     Breath sounds: Normal breath sounds.  Abdominal:     General: Bowel sounds are normal. There is no distension.     Palpations: Abdomen is soft.     Tenderness: There is no abdominal tenderness.  Musculoskeletal:        General: Normal range of motion.     Cervical back: Neck supple.     Right lower leg: No edema.     Left lower leg: No edema.  Lymphadenopathy:     Cervical: No cervical adenopathy.  Skin:    General: Skin is warm and dry.  Neurological:     Mental Status: She is alert. Mental status is at baseline.  Psychiatric:        Mood and Affect: Mood normal.      ASSESSMENT/ PLAN:  TODAY  1. 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     MD is aware of resident's narcotic use and is in agreement with current plan of care. We will attempt to wean resident as appropriate.  Ok Edwards NP [facility] Adult [facility]  Contact [phone] Monday through Friday 8am- 5pm  After hours call [phone]

## 2020-06-27 ENCOUNTER — Other Ambulatory Visit: Payer: Self-pay | Admitting: Adult Health

## 2020-06-27 ENCOUNTER — Encounter: Payer: Self-pay | Admitting: Adult Health

## 2020-06-27 ENCOUNTER — Non-Acute Institutional Stay (SKILLED_NURSING_FACILITY): Payer: Medicare Other | Admitting: Adult Health

## 2020-06-27 DIAGNOSIS — F0391 Unspecified dementia with behavioral disturbance: Secondary | ICD-10-CM | POA: Diagnosis not present

## 2020-06-27 DIAGNOSIS — I5032 Chronic diastolic (congestive) heart failure: Secondary | ICD-10-CM | POA: Diagnosis not present

## 2020-06-27 DIAGNOSIS — I4811 Longstanding persistent atrial fibrillation: Secondary | ICD-10-CM

## 2020-06-27 DIAGNOSIS — I110 Hypertensive heart disease with heart failure: Secondary | ICD-10-CM | POA: Diagnosis not present

## 2020-06-27 MED ORDER — METOPROLOL TARTRATE 25 MG PO TABS: 12.5000 mg | ORAL_TABLET | Freq: Two times a day (BID) | ORAL | 0 refills | Status: DC

## 2020-06-27 MED ORDER — DONEPEZIL HCL 5 MG PO TABS: 5.0000 mg | ORAL_TABLET | Freq: Every day | ORAL | 0 refills | Status: DC

## 2020-06-27 MED ORDER — AMIODARONE HCL 200 MG PO TABS: 200.0000 mg | ORAL_TABLET | Freq: Every day | ORAL | 0 refills | Status: AC

## 2020-06-27 MED ORDER — BUSPIRONE HCL 5 MG PO TABS: 5.0000 mg | ORAL_TABLET | Freq: Two times a day (BID) | ORAL | 0 refills | Status: AC

## 2020-06-27 MED ORDER — SIMVASTATIN 20 MG PO TABS: 20.0000 mg | ORAL_TABLET | Freq: Every day | ORAL | 0 refills | Status: AC

## 2020-06-27 MED ORDER — APIXABAN 5 MG PO TABS: 5.0000 mg | ORAL_TABLET | Freq: Two times a day (BID) | ORAL | 0 refills | Status: DC

## 2020-06-27 MED ORDER — AMLODIPINE BESYLATE 5 MG PO TABS: 5.0000 mg | ORAL_TABLET | Freq: Every day | ORAL | 0 refills | Status: AC

## 2020-06-27 MED ORDER — ANASTROZOLE 1 MG PO TABS: 1.0000 mg | ORAL_TABLET | Freq: Every day | ORAL | 0 refills | Status: AC

## 2020-06-27 MED ORDER — SITAGLIPTIN PHOSPHATE 100 MG PO TABS: 100.0000 mg | ORAL_TABLET | Freq: Every day | ORAL | 0 refills | Status: AC

## 2020-06-27 NOTE — Progress Notes (Signed)
Location:    [place] Room Number: 153/P Place of Service:  SNF (31)    CODE STATUS: DNR  Allergies  Allergen Reactions  . Codeine   . Zantac [Ranitidine Hcl]     Chief Complaint  Patient presents with  . Discharge Note    Discharge Visit     HPI:  She is being discharged to home with home health for pt/ot/rn/cna. She will not need dme. She will need her prescriptions written and will need to follow up with her medical provider. She had been originally hospitalized for acute heart failure; respiratory failure. She was admitted to this facility for short term rehab. She transitioned to long term care. Her spouse wants her to return home with him. There is help available at home.    Past Medical History:  Diagnosis Date  . Abnormal uterine and vaginal bleeding, unspecified   . Allergic rhinitis due to pollen   . Anxiety   . Asthma   . Cancer [facility])    breast cancer - right  . Dementia ([place])   . Depression   . Diabetes mellitus without complication ([place])   . Essential (primary) hypertension   . Hyperlipidemia, unspecified   . Hypertension   . Major depressive disorder, single episode, unspecified   . Obesity, unspecified   . Pain in joint involving shoulder region   . Reflux esophagitis   . Trigger finger, acquired   . Type 2 diabetes mellitus with hyperglycemia ([place])   . Unspecified asthma with (acute) exacerbation   . 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 ([place])   . Unspecified osteoarthritis, unspecified site     Past Surgical History:  Procedure Laterality Date  . CHOLECYSTECTOMY    . MASTECTOMY Right   . RE-EXCISION OF BREAST LUMPECTOMY      Social History   Socioeconomic History  . Marital status: Married    Spouse name: Not on file  . Number of children: Not on file  . Years of education: Not on file  . Highest education level: Not on file  Occupational History  . Occupation: retired   Tobacco Use  . Smoking  status: Never Smoker  . Smokeless tobacco: Never Used  Vaping Use  . Vaping Use: Never used  Substance and Sexual Activity  . Alcohol use: No  . Drug use: No  . Sexual activity: Not Currently  Other Topics Concern  . Not on file  Social History Narrative   Long term resident of [facility]    Social Determinants of Health   Financial Resource Strain:   . Difficulty of Paying Living Expenses: Not on file  Food Insecurity:   . Worried About Charity fundraiser in the Last Year: Not on file  . Ran Out of Food in the Last Year: Not on file  Transportation Needs:   . Lack of Transportation (Medical): Not on file  . Lack of Transportation (Non-Medical): Not on file  Physical Activity:   . Days of Exercise per Week: Not on file  . Minutes of Exercise per Session: Not on file  Stress:   . Feeling of Stress : Not on file  Social Connections:   . Frequency of Communication with Friends and Family: Not on file  . Frequency of Social Gatherings with Friends and Family: Not on file  . Attends Religious Services: Not on file  . Active Member of Clubs or Organizations: Not on file  . Attends Archivist Meetings: Not on  file  . Marital Status: Not on file  Intimate Partner Violence:   . Fear of Current or Ex-Partner: Not on file  . Emotionally Abused: Not on file  . Physically Abused: Not on file  . Sexually Abused: Not on file   Family History  Family history unknown: Yes    VITAL SIGNS There were no vitals taken for this visit.  Patient's Medications  New Prescriptions   No medications on file  Previous Medications   ACETAMINOPHEN (TYLENOL) 325 MG TABLET    Take 2 tablets (650 mg total) by mouth every 6 (six) hours as needed for mild pain (or Fever >/= 101).   AMINO ACIDS-PROTEIN HYDROLYS (FEEDING SUPPLEMENT, PRO-STAT 64,) LIQD    Take 30 mLs by mouth 2 (two) times daily between meals. To support wound healing and overall protein intake 09:00 AM, 05:00 PM   AMIODARONE  (PACERONE) 200 MG TABLET    Take 1 tablet (200 mg total) by mouth daily.   AMLODIPINE (NORVASC) 5 MG TABLET    Take 1 tablet (5 mg total) by mouth daily.   ANASTROZOLE (ARIMIDEX) 1 MG TABLET    Take 1 tablet (1 mg total) by mouth daily.   APIXABAN (ELIQUIS) 5 MG TABS TABLET    Take 1 tablet (5 mg total) by mouth 2 (two) times daily.   BALSAM PERU-CASTOR OIL (VENELEX) OINT    Special Instructions: Apply to sacrum, bilateral buttocks and coccyx qshift for prevention. Every Shift Day, Evening, Night   BUSPIRONE (BUSPAR) 5 MG TABLET    Take 1 tablet (5 mg total) by mouth 2 (two) times daily.   DONEPEZIL (ARICEPT) 5 MG TABLET    Take 1 tablet (5 mg total) by mouth at bedtime.   METOPROLOL TARTRATE (LOPRESSOR) 25 MG TABLET    Take 0.5 tablets (12.5 mg total) by mouth 2 (two) times daily.   MULTIPLE VITAMIN (MULTIVITAMIN ADULT PO)    Take 1 tablet by mouth daily.   NON FORMULARY    Diet Change: Regular with nectar thick liquids   NON FORMULARY    Magic Cup qhs daily   SIMVASTATIN (ZOCOR) 20 MG TABLET    Take 1 tablet (20 mg total) by mouth daily.   SITAGLIPTIN (JANUVIA) 100 MG TABLET    Take 1 tablet (100 mg total) by mouth daily.  Modified Medications   No medications on file  Discontinued Medications   No medications on file     SIGNIFICANT DIAGNOSTIC EXAMS   PREVIOUS  02-05-20: chest x-ray: Worsening patchy bilateral airspace disease compatible with multifocal pneumonia. Small right effusion.  02-06-20: ct of head:  1. Interval development of extensive bilateral upper lobe airspace opacities, right greater than left. Findings are concerning for multifocal pneumonia. 2. New small right pleural effusion and moderate left pleural effusion with overlying areas of compressive type atelectasis. 3. Aortic atherosclerosis, in addition to RCA and LAD coronary artery disease. Please note that although the presence of coronary artery calcium documents the presence of coronary artery disease, the  severity of this disease and any potential stenosis cannot be assessed on this non-gated CT examination. Assessment for potential risk factor modification, dietary therapy or pharmacologic therapy may be warranted, if clinically indicated. Aortic Atherosclerosis   02-13-20: chest x-ray:  1. Multifocal bilateral pulmonary infiltrates and small bilateral pleural effusions again noted. Similar findings noted on prior exam. 2.  Stable cardiomegaly.  02-13-20: swallow study: Recommend D3/mech soft and NTL   03-04-20: chest x-ray:  1. Cardiomegaly.  2. Right pleural effusion.   NO NEW EXAMS.    LABS REVIEWED PREVIOUS  12-16-19: hgb  a1c 6.8  02-05-20: wbc 8.5; hgb 9.8; hct 32.4; mcv 94.7plt 344 glucose 285; bun 20; creat 0.70; k+ 3.0; na++ 143; ca 8.3 mag 2.2 blood culture: no growth 02-06-20: vit B 12: 725; folate 6.2; ammonia 38; tsh 1.503; free t4: 1.24 02-08-20: wbc 7.6; hgb 10.1; hct 34.4; mcv 98.6 plt 299; glucose 113; bun 12; creat 0.64; k+ 3.2; na++ 146; ca 7.9  02-10-20: glucose 118; bun 19; creat 0.70; k+ 3.1; na++ 143; ca 7.9 liver normal albumin 2.1 02-14-20: wbc 5.8; hgb 9.0; hct 29.7; mcv 94.9 plt 245; glucose 125; bun 27; creat 0.80; k+ 3.9; na++ 142; ca 8.5  02-29-20: glucose 121; bun 51; creat 1.75; k+ 4.5; na++ 137 ca 8.6   03-01-20: chol 123 LDL 56 trig 40 hdl 59 03-03-20: glucose 246; bun 42; creat 1.07 ;k+ 4.4; na++ 137; ca 9.1  03-07-20: glucose 158; bun 12; creat 0.72; k+ 35; na++ 139; ca 8.8 04-03-20: wbc 9.1; hgb 10.1; hct 31.7; mcv 91.4 plt 244; glucose 204; bun 37; creat 1.24; k+ 5.1; na++ 139; ca 9.2 liver normal albumin 3.7 chol 152; ldl 67; trig 70; hdl 71; hgb a1c 8.3 tsh 2.76 05-17-20: glucose 223; bun 41; creat 1.21; k+ 5.3; na++ 136; ca 8.6 05-21-20: glucose 151; bun 18; creat 0.84; k+ 3.9; na++ 141; ca 8.7    NO NEW LABS.   Review of Systems  Unable to perform ROS: Dementia (uanble to partipicate )    Physical Exam Constitutional:      General: She is not in acute  distress.    Appearance: She is well-developed. She is not diaphoretic.  Neck:     Thyroid: No thyromegaly.  Cardiovascular:     Rate and Rhythm: Normal rate and regular rhythm.     Pulses: Normal pulses.     Heart sounds: Normal heart sounds.  Pulmonary:     Effort: Pulmonary effort is normal. No respiratory distress.     Breath sounds: Normal breath sounds.  Abdominal:     General: Bowel sounds are normal. There is no distension.     Palpations: Abdomen is soft.     Tenderness: There is no abdominal tenderness.  Musculoskeletal:        General: Normal range of motion.     Cervical back: Neck supple.     Right lower leg: No edema.     Left lower leg: No edema.  Lymphadenopathy:     Cervical: No cervical adenopathy.  Skin:    General: Skin is warm and dry.  Neurological:     Mental Status: She is alert. Mental status is at baseline.  Psychiatric:        Mood and Affect: Mood normal.       ASSESSMENT/ PLAN:   Patient is being discharged with the following home health services:  Pt/ot/rn/cna: to evaluate and treat as indicated for gait balance strength adl training adl care wound and diabetes management.   Patient is being discharged with the following durable medical equipment:  None needed   Patient has been advised to f/u with their PCP in 1-2 weeks to bring them up to date on their rehab stay.  Social services at facility was responsible for arranging this appointment.  Pt was provided with a 30 day supply of prescriptions for medications and refills must be obtained from their PCP.  For controlled substances, a more  limited supply may be provided adequate until PCP appointment only.   A 30 day supply of her prescription medications have been sent to [place]  Time spent with patient: 35 minutes: medications; home health dme.    Ok Edwards NP [facility] Adult Medicine  Contact [phone] Monday through Friday 8am- 5pm  After hours call [phone]

## 2020-06-29 DIAGNOSIS — I4811 Longstanding persistent atrial fibrillation: Secondary | ICD-10-CM

## 2020-06-29 DIAGNOSIS — E1159 Type 2 diabetes mellitus with other circulatory complications: Secondary | ICD-10-CM

## 2020-06-29 DIAGNOSIS — J690 Pneumonitis due to inhalation of food and vomit: Secondary | ICD-10-CM | POA: Diagnosis not present

## 2020-06-29 DIAGNOSIS — J189 Pneumonia, unspecified organism: Secondary | ICD-10-CM | POA: Diagnosis not present

## 2020-06-29 DIAGNOSIS — F482 Pseudobulbar affect: Secondary | ICD-10-CM

## 2020-06-29 DIAGNOSIS — J45901 Unspecified asthma with (acute) exacerbation: Secondary | ICD-10-CM

## 2020-06-29 DIAGNOSIS — I110 Hypertensive heart disease with heart failure: Secondary | ICD-10-CM | POA: Diagnosis not present

## 2020-06-29 DIAGNOSIS — E1165 Type 2 diabetes mellitus with hyperglycemia: Secondary | ICD-10-CM

## 2020-06-29 DIAGNOSIS — J9601 Acute respiratory failure with hypoxia: Secondary | ICD-10-CM | POA: Diagnosis not present

## 2020-06-29 DIAGNOSIS — I5032 Chronic diastolic (congestive) heart failure: Secondary | ICD-10-CM | POA: Diagnosis not present

## 2020-06-29 DIAGNOSIS — F0391 Unspecified dementia with behavioral disturbance: Secondary | ICD-10-CM | POA: Diagnosis not present

## 2020-06-30 ENCOUNTER — Encounter (HOSPITAL_COMMUNITY): Payer: Self-pay

## 2020-06-30 ENCOUNTER — Inpatient Hospital Stay (HOSPITAL_COMMUNITY): Payer: Medicare Other

## 2020-06-30 ENCOUNTER — Ambulatory Visit: Admit: 2020-06-30 | Discharge: 2020-06-30 | Discharge status: Absent without leave | Payer: BLUE CROSS/BLUE SHIELD

## 2020-06-30 ENCOUNTER — Other Ambulatory Visit: Payer: Self-pay

## 2020-06-30 ENCOUNTER — Ambulatory Visit
Admission: EM | Admit: 2020-06-30 | Discharge: 2020-06-30 | Payer: Medicare Other | Attending: Emergency Medicine | Admitting: Emergency Medicine

## 2020-06-30 ENCOUNTER — Inpatient Hospital Stay (HOSPITAL_COMMUNITY)
Admission: EM | Admit: 2020-06-30 | Discharge: 2020-07-04 | DRG: 689 | Disposition: A | Discharge status: Home Health | Payer: Medicare Other | Attending: Family Medicine | Admitting: Family Medicine

## 2020-06-30 ENCOUNTER — Emergency Department (HOSPITAL_COMMUNITY): Payer: Medicare Other

## 2020-06-30 ENCOUNTER — Ambulatory Visit
Admission: EM | Admit: 2020-06-30 | Discharge: 2020-06-30 | Discharge status: Absent without leave | Payer: Medicare Other

## 2020-06-30 DIAGNOSIS — E119 Type 2 diabetes mellitus without complications: Secondary | ICD-10-CM | POA: Diagnosis not present

## 2020-06-30 DIAGNOSIS — F329 Major depressive disorder, single episode, unspecified: Secondary | ICD-10-CM | POA: Diagnosis present

## 2020-06-30 DIAGNOSIS — R000 Tachycardia, unspecified: Secondary | ICD-10-CM | POA: Diagnosis not present

## 2020-06-30 DIAGNOSIS — B961 Klebsiella pneumoniae [K. pneumoniae] as the cause of diseases classified elsewhere: Secondary | ICD-10-CM | POA: Diagnosis present

## 2020-06-30 DIAGNOSIS — R4182 Altered mental status, unspecified: Secondary | ICD-10-CM | POA: Diagnosis not present

## 2020-06-30 DIAGNOSIS — G9341 Metabolic encephalopathy: Secondary | ICD-10-CM | POA: Diagnosis present

## 2020-06-30 DIAGNOSIS — Z7984 Long term (current) use of oral hypoglycemic drugs: Secondary | ICD-10-CM

## 2020-06-30 DIAGNOSIS — R001 Bradycardia, unspecified: Secondary | ICD-10-CM | POA: Diagnosis present

## 2020-06-30 DIAGNOSIS — Z7901 Long term (current) use of anticoagulants: Secondary | ICD-10-CM

## 2020-06-30 DIAGNOSIS — J9611 Chronic respiratory failure with hypoxia: Secondary | ICD-10-CM | POA: Diagnosis present

## 2020-06-30 DIAGNOSIS — N179 Acute kidney failure, unspecified: Secondary | ICD-10-CM | POA: Diagnosis present

## 2020-06-30 DIAGNOSIS — I5032 Chronic diastolic (congestive) heart failure: Secondary | ICD-10-CM | POA: Diagnosis present

## 2020-06-30 DIAGNOSIS — K529 Noninfective gastroenteritis and colitis, unspecified: Secondary | ICD-10-CM | POA: Diagnosis present

## 2020-06-30 DIAGNOSIS — F32A Depression, unspecified: Secondary | ICD-10-CM | POA: Diagnosis present

## 2020-06-30 DIAGNOSIS — E1165 Type 2 diabetes mellitus with hyperglycemia: Secondary | ICD-10-CM | POA: Diagnosis present

## 2020-06-30 DIAGNOSIS — F03918 Unspecified dementia, unspecified severity, with other behavioral disturbance: Secondary | ICD-10-CM | POA: Diagnosis present

## 2020-06-30 DIAGNOSIS — Z66 Do not resuscitate: Secondary | ICD-10-CM | POA: Diagnosis present

## 2020-06-30 DIAGNOSIS — Z888 Allergy status to other drugs, medicaments and biological substances status: Secondary | ICD-10-CM

## 2020-06-30 DIAGNOSIS — F0391 Unspecified dementia with behavioral disturbance: Secondary | ICD-10-CM | POA: Diagnosis present

## 2020-06-30 DIAGNOSIS — R451 Restlessness and agitation: Secondary | ICD-10-CM | POA: Diagnosis not present

## 2020-06-30 DIAGNOSIS — Z20822 Contact with and (suspected) exposure to covid-19: Secondary | ICD-10-CM | POA: Diagnosis present

## 2020-06-30 DIAGNOSIS — I959 Hypotension, unspecified: Secondary | ICD-10-CM | POA: Diagnosis present

## 2020-06-30 DIAGNOSIS — J301 Allergic rhinitis due to pollen: Secondary | ICD-10-CM | POA: Diagnosis present

## 2020-06-30 DIAGNOSIS — Z853 Personal history of malignant neoplasm of breast: Secondary | ICD-10-CM

## 2020-06-30 DIAGNOSIS — F419 Anxiety disorder, unspecified: Secondary | ICD-10-CM | POA: Diagnosis present

## 2020-06-30 DIAGNOSIS — I4891 Unspecified atrial fibrillation: Secondary | ICD-10-CM | POA: Diagnosis not present

## 2020-06-30 DIAGNOSIS — E860 Dehydration: Secondary | ICD-10-CM | POA: Diagnosis present

## 2020-06-30 DIAGNOSIS — I110 Hypertensive heart disease with heart failure: Secondary | ICD-10-CM | POA: Diagnosis present

## 2020-06-30 DIAGNOSIS — I480 Paroxysmal atrial fibrillation: Secondary | ICD-10-CM | POA: Diagnosis present

## 2020-06-30 DIAGNOSIS — I495 Sick sinus syndrome: Secondary | ICD-10-CM

## 2020-06-30 DIAGNOSIS — E785 Hyperlipidemia, unspecified: Secondary | ICD-10-CM | POA: Diagnosis present

## 2020-06-30 DIAGNOSIS — I509 Heart failure, unspecified: Secondary | ICD-10-CM

## 2020-06-30 DIAGNOSIS — E8770 Fluid overload, unspecified: Secondary | ICD-10-CM | POA: Diagnosis not present

## 2020-06-30 DIAGNOSIS — I4892 Unspecified atrial flutter: Secondary | ICD-10-CM | POA: Diagnosis not present

## 2020-06-30 DIAGNOSIS — Z9049 Acquired absence of other specified parts of digestive tract: Secondary | ICD-10-CM

## 2020-06-30 DIAGNOSIS — Z79899 Other long term (current) drug therapy: Secondary | ICD-10-CM

## 2020-06-30 DIAGNOSIS — I4510 Unspecified right bundle-branch block: Secondary | ICD-10-CM | POA: Diagnosis not present

## 2020-06-30 DIAGNOSIS — I4811 Longstanding persistent atrial fibrillation: Secondary | ICD-10-CM | POA: Diagnosis not present

## 2020-06-30 DIAGNOSIS — R9431 Abnormal electrocardiogram [ECG] [EKG]: Secondary | ICD-10-CM | POA: Diagnosis not present

## 2020-06-30 DIAGNOSIS — Z9010 Acquired absence of unspecified breast and nipple: Secondary | ICD-10-CM

## 2020-06-30 DIAGNOSIS — N390 Urinary tract infection, site not specified: Principal | ICD-10-CM | POA: Diagnosis present

## 2020-06-30 DIAGNOSIS — Z885 Allergy status to narcotic agent status: Secondary | ICD-10-CM

## 2020-06-30 LAB — COMPREHENSIVE METABOLIC PANEL
ALT: 40 U/L (ref 0–44)
AST: 34 U/L (ref 15–41)
Albumin: 3.3 g/dL — ABNORMAL LOW (ref 3.5–5.0)
Alkaline Phosphatase: 126 U/L (ref 38–126)
Anion gap: 14 (ref 5–15)
BUN: 45 mg/dL — ABNORMAL HIGH (ref 8–23)
CO2: 24 mmol/L (ref 22–32)
Calcium: 8.8 mg/dL — ABNORMAL LOW (ref 8.9–10.3)
Chloride: 95 mmol/L — ABNORMAL LOW (ref 98–111)
Creatinine, Ser: 1.64 mg/dL — ABNORMAL HIGH (ref 0.44–1.00)
GFR, Estimated: 29 mL/min — ABNORMAL LOW (ref >60)
Glucose, Bld: 344 mg/dL — ABNORMAL HIGH (ref 70–99)
Potassium: 5 mmol/L (ref 3.5–5.1)
Sodium: 133 mmol/L — ABNORMAL LOW (ref 135–145)
Total Bilirubin: 1.4 mg/dL — ABNORMAL HIGH (ref 0.3–1.2)
Total Protein: 7.2 g/dL (ref 6.5–8.1)

## 2020-06-30 LAB — CBC WITH DIFFERENTIAL/PLATELET
Abs Immature Granulocytes: 0.11 10*3/uL — ABNORMAL HIGH (ref 0.00–0.07)
Basophils Absolute: 0 10*3/uL (ref 0.0–0.1)
Basophils Relative: 0 %
Eosinophils Absolute: 0 10*3/uL (ref 0.0–0.5)
Eosinophils Relative: 0 %
HCT: 33.2 % — ABNORMAL LOW (ref 36.0–46.0)
Hemoglobin: 10 g/dL — ABNORMAL LOW (ref 12.0–15.0)
Immature Granulocytes: 1 %
Lymphocytes Relative: 3 %
Lymphs Abs: 0.5 10*3/uL — ABNORMAL LOW (ref 0.7–4.0)
MCH: 26.7 pg (ref 26.0–34.0)
MCHC: 30.1 g/dL (ref 30.0–36.0)
MCV: 88.5 fL (ref 80.0–100.0)
Monocytes Absolute: 1.7 10*3/uL — ABNORMAL HIGH (ref 0.1–1.0)
Monocytes Relative: 9 %
Neutro Abs: 16.8 10*3/uL — ABNORMAL HIGH (ref 1.7–7.7)
Neutrophils Relative %: 87 %
Platelets: 234 10*3/uL (ref 150–400)
RBC: 3.75 MIL/uL — ABNORMAL LOW (ref 3.87–5.11)
RDW: 15.8 % — ABNORMAL HIGH (ref 11.5–15.5)
WBC: 19.1 10*3/uL — ABNORMAL HIGH (ref 4.0–10.5)
nRBC: 0 % (ref 0.0–0.2)

## 2020-06-30 LAB — URINALYSIS, ROUTINE W REFLEX MICROSCOPIC
Bilirubin Urine: NEGATIVE
Glucose, UA: NEGATIVE mg/dL
Hgb urine dipstick: NEGATIVE
Ketones, ur: NEGATIVE mg/dL
Nitrite: NEGATIVE
Protein, ur: 100 mg/dL — AB
Specific Gravity, Urine: 1.019 (ref 1.005–1.030)
pH: 5 (ref 5.0–8.0)

## 2020-06-30 LAB — MAGNESIUM: Magnesium: 2.5 mg/dL — ABNORMAL HIGH (ref 1.7–2.4)

## 2020-06-30 LAB — RESPIRATORY PANEL BY RT PCR (FLU A&B, COVID)
Influenza A by PCR: NEGATIVE
Influenza B by PCR: NEGATIVE
SARS Coronavirus 2 by RT PCR: NEGATIVE

## 2020-06-30 LAB — TROPONIN I (HIGH SENSITIVITY)
Troponin I (High Sensitivity): 14 ng/L (ref <18)
Troponin I (High Sensitivity): 14 ng/L (ref <18)

## 2020-06-30 LAB — BRAIN NATRIURETIC PEPTIDE: B Natriuretic Peptide: 1208 pg/mL — ABNORMAL HIGH (ref 0.0–100.0)

## 2020-06-30 LAB — TSH: TSH: 2.558 u[IU]/mL (ref 0.350–4.500)

## 2020-06-30 LAB — CBG MONITORING, ED: Glucose-Capillary: 258 mg/dL — ABNORMAL HIGH (ref 70–99)

## 2020-06-30 MED ORDER — BUSPIRONE HCL 5 MG PO TABS
5.0000 mg | ORAL_TABLET | Freq: Two times a day (BID) | ORAL | Status: DC
  Administered 2020-07-01 – 2020-07-04 (×7): 5 mg via ORAL
  Filled 2020-06-30 (×7): qty 1

## 2020-06-30 MED ORDER — SODIUM CHLORIDE 0.9 % IV SOLN: INTRAVENOUS | Status: AC

## 2020-06-30 MED ORDER — ANASTROZOLE 1 MG PO TABS
1.0000 mg | ORAL_TABLET | Freq: Every day | ORAL | Status: DC
  Administered 2020-07-01 – 2020-07-04 (×4): 1 mg via ORAL
  Filled 2020-06-30 (×6): qty 1

## 2020-06-30 MED ORDER — APIXABAN 2.5 MG PO TABS
2.5000 mg | ORAL_TABLET | Freq: Two times a day (BID) | ORAL | Status: DC
  Administered 2020-07-01 – 2020-07-03 (×5): 2.5 mg via ORAL
  Filled 2020-06-30 (×5): qty 1

## 2020-06-30 MED ORDER — ONDANSETRON HCL 4 MG/2ML IJ SOLN: 4.0000 mg | Freq: Four times a day (QID) | INTRAMUSCULAR | Status: DC | PRN

## 2020-06-30 MED ORDER — SODIUM CHLORIDE 0.9 % IV SOLN
1.0000 g | INTRAVENOUS | Status: DC
  Administered 2020-06-30 – 2020-07-02 (×3): 1 g via INTRAVENOUS
  Filled 2020-06-30 (×3): qty 10

## 2020-06-30 MED ORDER — ACETAMINOPHEN 650 MG RE SUPP: 650.0000 mg | Freq: Four times a day (QID) | RECTAL | Status: DC | PRN

## 2020-06-30 MED ORDER — HALOPERIDOL LACTATE 5 MG/ML IJ SOLN
5.0000 mg | Freq: Once | INTRAMUSCULAR | Status: DC
  Filled 2020-06-30: qty 1

## 2020-06-30 MED ORDER — ONDANSETRON HCL 4 MG PO TABS: 4.0000 mg | ORAL_TABLET | Freq: Four times a day (QID) | ORAL | Status: DC | PRN

## 2020-06-30 MED ORDER — ACETAMINOPHEN 325 MG PO TABS
650.0000 mg | ORAL_TABLET | Freq: Four times a day (QID) | ORAL | Status: DC | PRN
  Administered 2020-07-01 (×2): 650 mg via ORAL
  Filled 2020-06-30 (×4): qty 2

## 2020-06-30 MED ORDER — INSULIN ASPART 100 UNIT/ML ~~LOC~~ SOLN
0.0000 [IU] | Freq: Three times a day (TID) | SUBCUTANEOUS | Status: DC
  Administered 2020-07-01: 1 [IU] via SUBCUTANEOUS
  Administered 2020-07-01: 2 [IU] via SUBCUTANEOUS
  Administered 2020-07-01 – 2020-07-02 (×3): 1 [IU] via SUBCUTANEOUS
  Administered 2020-07-02 – 2020-07-03 (×3): 2 [IU] via SUBCUTANEOUS
  Administered 2020-07-03: 1 [IU] via SUBCUTANEOUS
  Administered 2020-07-04 (×2): 2 [IU] via SUBCUTANEOUS

## 2020-06-30 MED ORDER — INSULIN ASPART 100 UNIT/ML ~~LOC~~ SOLN
0.0000 [IU] | Freq: Every day | SUBCUTANEOUS | Status: DC
  Administered 2020-06-30: 3 [IU] via SUBCUTANEOUS
  Filled 2020-06-30: qty 1

## 2020-06-30 MED ORDER — DONEPEZIL HCL 5 MG PO TABS
5.0000 mg | ORAL_TABLET | Freq: Every day | ORAL | Status: DC
  Administered 2020-07-01 – 2020-07-02 (×2): 5 mg via ORAL
  Filled 2020-06-30 (×4): qty 1

## 2020-06-30 NOTE — Significant Event (Signed)
   Asked to review ECG.  Patient with multiple medical problems including dementia came to ER with dysuria and was found to have slow HR after having an episode of diarrhea.  No chest pain.  BP has been stable.  No syncope.  ECG shows junctional escape rhythm.  No clear ST elevation.    Given lack of chest pain and ECG changes along with stable BP in the setting of junction rhythm with narrow QRS complex, would not plan or emergent cath.  No indication for temp pacer at this time.  They will monitor.  Hold all rate slowing drugs at this time including amiodarone and metoprolol.    Jettie Booze, MD

## 2020-06-30 NOTE — ED Triage Notes (Signed)
Pt was taken to urgent care with complaints and foul smelling urine since yesterday. Once there she had large amounts of diarrhea, EKG was abnormal showing STEMI, EMS EKG no ST elevation , HR 36 and CBG 406

## 2020-06-30 NOTE — H&P (Addendum)
History and Physical    Amy Hoffman:[id number] DOB: 1940/10/11 DOA: 06/30/2020  PCP: [name], NP   Patient coming from: Home  I have personally briefly reviewed patient's old medical records in [place]  Chief Complaint: Poor urine output.  HPI: Amy Hoffman is a 79 y.o. female with medical history significant for atrial fibrillation, dementia, diastolic CHF, diabetes mellitus. Patient was brought to the ED from home with reports of pain with urination and also poor urine output.  History from patient is limited due to significant dementia.  History is obtained from daughter- Amy Hoffman who is present at bedside.  Patient has been at a nursing home until about a week ago when she was sent home.   Patient's daughter reports that over the past 1 to 2 days patient's urine output has reduced, she normally has a good appetite but she has not been able to eat anything.  Patient has also been very weak.  And there has also been a change in mental status, with patient saying no repeatedly to questions.  Patient also has been having some diarrhea since being at the pain center, but due to is not able to quantify severity as she does not live with patient, but this was reported by patient's home nurse. Patient answered no to chest pain, tells me that she ate this morning, but that she has forgotten what she ate this morning or why she is in the hospital.  Otherwise she keeps repeating no to every other question.  Patient was sent to urgent care, patient had an episode of diarrhea, and heart rate was noted to drop to 30s.  Also reported was EKG showing ST elevation MI, but EMS did not see an ST elevation MI.  Patient was subsequently referred to the ED.  ED Course: Temperature 99.  Heart rate 35, blood pressure 99-110.  O2 sats 94% on room air.  Troponin 14.  UA positive of UTI with many bacteria, trace leukocytes.  BNP elevated 1208.  Leukocytosis of 19.1.  Creatinine elevated 1.64.   Blood glucose 344.  Portable chest x-ray showing mild CHF without edema. EDP talked to cardiologist Dr. Charlean Sanfilippo showing junctional escape rhythm, no plans for urgent Recommends holding rate limiting drugs amiodarone and metoprolol. Hospitalist to admit for further evaluation and management.  Review of Systems: Limited assessment due to patient's baseline dementia.  Past Medical History:  Diagnosis Date  . Abnormal uterine and vaginal bleeding, unspecified   . Allergic rhinitis due to pollen   . Anxiety   . Asthma   . Cancer [facility])    breast cancer - right  . Dementia ([place])   . Depression   . Diabetes mellitus without complication ([place])   . Essential (primary) hypertension   . Hyperlipidemia, unspecified   . Hypertension   . Major depressive disorder, single episode, unspecified   . Obesity, unspecified   . Pain in joint involving shoulder region   . Reflux esophagitis   . Trigger finger, acquired   . Type 2 diabetes mellitus with hyperglycemia ([place])   . Unspecified asthma with (acute) exacerbation   . 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 ([place])   . Unspecified osteoarthritis, unspecified site     Past Surgical History:  Procedure Laterality Date  . CHOLECYSTECTOMY    . MASTECTOMY Right   . RE-EXCISION OF BREAST LUMPECTOMY       reports that she has never smoked. She has never used smokeless tobacco. She reports that  she does not drink alcohol and does not use drugs.  Allergies  Allergen Reactions  . Codeine   . Zantac [Ranitidine Hcl]     Family History  Family history unknown: Yes    Prior to Admission medications   Medication Sig Start Date End Date Taking? Authorizing Provider  acetaminophen (TYLENOL) 325 MG tablet Take 2 tablets (650 mg total) by mouth every 6 (six) hours as needed for mild pain (or Fever >/= 101). 02/14/20   Roxan Hockey, MD  Amino Acids-Protein Hydrolys (FEEDING SUPPLEMENT, PRO-STAT 64,) LIQD Take 30 mLs by mouth 2 (two)  times daily between meals. To support wound healing and overall protein intake 09:00 AM, 05:00 PM 05/18/20   [provider]  amiodarone (PACERONE) 200 MG tablet Take 1 tablet (200 mg total) by mouth daily. 06/27/20   Gerlene Fee, NP  amLODipine (NORVASC) 5 MG tablet Take 1 tablet (5 mg total) by mouth daily. 06/27/20   Gerlene Fee, NP  anastrozole (ARIMIDEX) 1 MG tablet Take 1 tablet (1 mg total) by mouth daily. 06/27/20   Gerlene Fee, NP  apixaban (ELIQUIS) 5 MG TABS tablet Take 1 tablet (5 mg total) by mouth 2 (two) times daily. 06/27/20   Gerlene Fee, NP  Roseanne Kaufman [employer] [facility]) OINT Special Instructions: Apply to sacrum, bilateral buttocks and coccyx qshift for prevention. Every Shift Day, Evening, Night 03/01/20   [provider]  busPIRone (BUSPAR) 5 MG tablet Take 1 tablet (5 mg total) by mouth 2 (two) times daily. 06/27/20   Gerlene Fee, NP  donepezil (ARICEPT) 5 MG tablet Take 1 tablet (5 mg total) by mouth at bedtime. 06/27/20   Gerlene Fee, NP  metoprolol tartrate (LOPRESSOR) 25 MG tablet Take 0.5 tablets (12.5 mg total) by mouth 2 (two) times daily. 06/27/20   Gerlene Fee, NP  Multiple Vitamin (MULTIVITAMIN ADULT PO) Take 1 tablet by mouth daily.    [provider]  NON FORMULARY Diet Change: Regular with nectar thick liquids 02/26/20   [provider]  NON FORMULARY Magic Cup qhs daily 05/10/20   [provider]  simvastatin (ZOCOR) 20 MG tablet Take 1 tablet (20 mg total) by mouth daily. 06/27/20   Gerlene Fee, NP  sitaGLIPtin (JANUVIA) 100 MG tablet Take 1 tablet (100 mg total) by mouth daily. 06/27/20   Gerlene Fee, NP    Physical Exam: Limited by patient's altered mental status/dementia  Vitals:   06/30/20 1430  BP: 111/61  Pulse: (!) 35  Resp: (!) 23  Temp: 99 F (37.2 C)  TempSrc: Oral  SpO2: 100%  Weight: 58.7 kg  Height: 5\' 5"  (1.651 m)    Constitutional: Initially  calm, became agitated. Vitals:   06/30/20 1430  BP: 111/61  Pulse: (!) 35  Resp: (!) 23  Temp: 99 F (37.2 C)  TempSrc: Oral  SpO2: 100%  Weight: 58.7 kg  Height: 5\' 5"  (1.651 m)   Eyes: PERRL, lids and conjunctivae normal ENMT: Mucous membranes are very dry  neck: normal, supple, no masses, no thyromegaly Respiratory: clear to auscultation bilaterally, no wheezing, no crackles. Normal respiratory effort. No accessory muscle use.  Cardiovascular: Initial bradycardia on initial examination, but on reevaluation patient heart rates increased to 90.  Bilateral trace to +1 pitting extremity edema to knees, patient wearing compression stockings -daughter reports this is chronic over the past year and unchanged. 2+ pedal pulses.  Abdomen: no tenderness, no masses palpated. No hepatosplenomegaly. Bowel  sounds positive.  Musculoskeletal: no clubbing / cyanosis. No joint deformity upper and lower extremities. Good ROM, no contractures. Normal muscle tone.  Skin: no rashes, lesions, ulcers. No induration Neurologic: Limited exam, but no apparent cranial nerve abnormality, patient able to squeeze my fingers bilaterally, not following directions to move legs Psychiatric: Limited exam, patient not answering questions.  Alert.  Labs on Admission: I have personally reviewed following labs and imaging studies  CBC: Recent Labs  Lab 06/30/20 1441  WBC 19.1*  NEUTROABS 16.8*  HGB 10.0*  HCT 33.2*  MCV 88.5  PLT 950   Basic Metabolic Panel: Recent Labs  Lab 06/30/20 1441  NA 133*  K 5.0  CL 95*  CO2 24  GLUCOSE 344*  BUN 45*  CREATININE 1.64*  CALCIUM 8.8*   Liver Function Tests: Recent Labs  Lab 06/30/20 1441  AST 34  ALT 40  ALKPHOS 126  BILITOT 1.4*  PROT 7.2  ALBUMIN 3.3*   Urine analysis:    Component Value Date/Time   COLORURINE AMBER (A) 06/30/2020 1441   APPEARANCEUR CLOUDY (A) 06/30/2020 1441   LABSPEC 1.019 06/30/2020 1441   PHURINE 5.0 06/30/2020 1441    GLUCOSEU NEGATIVE 06/30/2020 1441   [place] 06/30/2020 1441   BILIRUBINUR NEGATIVE 06/30/2020 1441   KETONESUR NEGATIVE 06/30/2020 1441   PROTEINUR 100 (A) 06/30/2020 1441   UROBILINOGEN 0.2 01/23/2013 2317   NITRITE NEGATIVE 06/30/2020 1441   LEUKOCYTESUR TRACE (A) 06/30/2020 1441    Radiological Exams on Admission: DG Chest 2 View  Result Date: 06/30/2020 CLINICAL DATA:  Foul-smelling urine and abnormal EKG EXAM: CHEST - 2 VIEW COMPARISON:  02/13/2020 FINDINGS: Cardiac shadow is enlarged. Aortic calcifications are noted. Lungs are well aerated bilaterally. Chronic blunting of the right costophrenic angle is noted. No new focal infiltrate is seen. Mild central vascular congestion is noted. IMPRESSION: Changes of mild CHF without edema. Chronic changes in the right base. Electronically Signed   By: Inez Catalina M.D.   On: 06/30/2020 15:32    EKG: Independently reviewed.  P waves not present.  Junctional rhythm, rate 37.  QRS 107.  Incomplete LBBB.  QTc 429.  Assessment/Plan Principal Problem:   Bradycardia Active Problems:   Major depressive disorder, single episode, unspecified   Atrial fibrillation (HCC)   Dementia with behavioral disturbance (HCC)   CHF (congestive heart failure) (HCC)   Do not resuscitate status   Chronic respiratory failure with hypoxia (HCC)  Bradycardia-heart rates in the 30s, spontaneously improved now persistently in the 90s.EKG shows junctional rhythm.  Troponin 14 x 2.  Potassium 5.  Magnesium 2.5.  TSH 2.55. - EDP talked to cardiologist Dr. Irish Lack, no plans for emergent cath, no indication for temporary pacer at this time, hold rate limiting drugs amiodarone or metoprolol.   - Initial plan to admit to [facility] stepdown, will admit patient here.  -Cardiology consultation in a.m.  Acute kidney injury-creatinine elevated 1.64, baseline 0.8-1.2.  Likely prerenal from dehydration poor oral intake, likely diarrhea. -IV fluids normal saline 100  cc/h for 20 hours. -BMP in the morning  Urinary tract infection-temperature 99, WBC 19.1.  Does not meet criteria for sepsis.  UA is suggestive of UTI with many bacteria trace leukocytes. -Obtain urine cultures -IV ceftriaxone 1 g daily  Diarrhea- daughter unable to quantify frequency.  At least one episode today at urgent care.  -Stool C. difficile.  Metabolic encephalopathy, history of dementia with behavioral disturbance-likely from UTI and dehydration.  At baseline patient can recognize  family, in most cases she is able to hold simple conversation and answer simple questions, but daughter says patient has significant dementia which worsens when patient is ill with a UTI like today.  Recently discharged from nursing home. -Obtain head CT -Treat with IV fluids and IV antibiotics -Resume home donepezil  Atrial fibrillation-initial bradycardia, heart rate now 90s.  On anticoagulation, and rate limiting medications. -Resume Eliquis -Hold home amiodarone and metoprolol  Diastolic CHF with chronic respiratory failure- weights stable per chart.  Has chronic bilateral pitting edema, that stable.  BNP elevated at 1208, but this is in the setting of AKI.  Patient appears dehydrated, with poor oral intake in the past ~ 2 days..  Chest x-ray showing changes of mild CHF without edema.  Last echo 12/2019, EF 50 to 55%.  On 2 L at baseline, sats currently 94% on room air. -Supplemental O2. -IV fluids  Hypertension-blood pressure soft. -Hold home Norvasc, metoprolol  Diabetes mellitus-random glucose 344. Recent Hgba1c -8.3.  -Hold home Januvia -Sliding scale insulin   DVT prophylaxis: Eliquis Code Status: DNR, confirmed at bedside with daughter Amy Hoffman- HCPOA on the phone. Family Communication: Daughter [name] who she is the HPOA. Disposition Plan: ~ 2 days Consults called: Cardiology consultation in a.m. Admission status: Inpatient, telemetry. I certify that at the point of admission it is my  clinical judgment that the patient will require inpatient hospital care spanning beyond 2 midnights from the point of admission due to high intensity of service, high risk for further deterioration and high frequency of surveillance required. The following factors support the patient status of inpatient:    Bethena Roys MD [facility]  06/30/2020, 7:16 PM

## 2020-06-30 NOTE — ED Provider Notes (Addendum)
[facility] EMERGENCY DEPARTMENT Provider Note  CSN: [id number] Arrival date & time: 06/30/20 1405    History Chief Complaint  Patient presents with  . possible UTI  . Diarrhea    HPI  Amy Hoffman is a 79 y.o. female with history of dementia was recently in SNF but discharged home earlier this week. She was taken by husband and caregiver to UC today for foul smelling urine. She apparently had a large diarrhea stool while trying to give a urine specimen and then was noted to be bradycardic. An EKG done at [facility] was read as STEMI and so EMS was called. EMS reports their EKG did not show STEMI and so they brought her here. Patient denies any CP, SOB, or fever. She denies abdominal pain or nausea. She does endorse dysuria recently.   Past Medical History:  Diagnosis Date  . Abnormal uterine and vaginal bleeding, unspecified   . Allergic rhinitis due to pollen   . Anxiety   . Asthma   . Cancer [facility])    breast cancer - right  . Dementia ([place])   . Depression   . Diabetes mellitus without complication ([place])   . Essential (primary) hypertension   . Hyperlipidemia, unspecified   . Hypertension   . Major depressive disorder, single episode, unspecified   . Obesity, unspecified   . Pain in joint involving shoulder region   . Reflux esophagitis   . Trigger finger, acquired   . Type 2 diabetes mellitus with hyperglycemia ([place])   . Unspecified asthma with (acute) exacerbation   . 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 ([place])   . Unspecified osteoarthritis, unspecified site     Past Surgical History:  Procedure Laterality Date  . CHOLECYSTECTOMY    . MASTECTOMY Right   . RE-EXCISION OF BREAST LUMPECTOMY      Family History  Family history unknown: Yes    Social History   Tobacco Use  . Smoking status: Never Smoker  . Smokeless tobacco: Never Used  Vaping Use  . Vaping Use: Never used  Substance Use Topics  . Alcohol use: No  . Drug use: No     Home  Medications Prior to Admission medications   Medication Sig Start Date End Date Taking? Authorizing Provider  acetaminophen (TYLENOL) 325 MG tablet Take 2 tablets (650 mg total) by mouth every 6 (six) hours as needed for mild pain (or Fever >/= 101). 02/14/20   Roxan Hockey, MD  Amino Acids-Protein Hydrolys (FEEDING SUPPLEMENT, PRO-STAT 64,) LIQD Take 30 mLs by mouth 2 (two) times daily between meals. To support wound healing and overall protein intake 09:00 AM, 05:00 PM 05/18/20   [provider]  amiodarone (PACERONE) 200 MG tablet Take 1 tablet (200 mg total) by mouth daily. 06/27/20   Gerlene Fee, NP  amLODipine (NORVASC) 5 MG tablet Take 1 tablet (5 mg total) by mouth daily. 06/27/20   Gerlene Fee, NP  anastrozole (ARIMIDEX) 1 MG tablet Take 1 tablet (1 mg total) by mouth daily. 06/27/20   Gerlene Fee, NP  apixaban (ELIQUIS) 5 MG TABS tablet Take 1 tablet (5 mg total) by mouth 2 (two) times daily. 06/27/20   Gerlene Fee, NP  Roseanne Kaufman [place] [facility]) OINT Special Instructions: Apply to sacrum, bilateral buttocks and coccyx qshift for prevention. Every Shift Day, Evening, Night 03/01/20   [provider]  busPIRone (BUSPAR) 5 MG tablet Take 1 tablet (5 mg total) by mouth 2 (two) times daily. 06/27/20  Gerlene Fee, NP  donepezil (ARICEPT) 5 MG tablet Take 1 tablet (5 mg total) by mouth at bedtime. 06/27/20   Gerlene Fee, NP  metoprolol tartrate (LOPRESSOR) 25 MG tablet Take 0.5 tablets (12.5 mg total) by mouth 2 (two) times daily. 06/27/20   Gerlene Fee, NP  Multiple Vitamin (MULTIVITAMIN ADULT PO) Take 1 tablet by mouth daily.    [provider]  NON FORMULARY Diet Change: Regular with nectar thick liquids 02/26/20   [provider]  NON FORMULARY Magic Cup qhs daily 05/10/20   [provider]  simvastatin (ZOCOR) 20 MG tablet Take 1 tablet (20 mg total) by mouth daily. 06/27/20   Gerlene Fee, NP    sitaGLIPtin (JANUVIA) 100 MG tablet Take 1 tablet (100 mg total) by mouth daily. 06/27/20   Gerlene Fee, NP     Allergies    Codeine and Zantac [ranitidine hcl]   Review of Systems   Review of Systems A comprehensive review of systems was completed and negative except as noted in HPI.    Physical Exam BP 111/61 (BP Location: Right Arm)   Pulse (!) 35   Temp 99 F (37.2 C) (Oral)   Resp (!) 23   Ht 5\' 5"  (1.651 m)   Wt 58.7 kg   SpO2 100%   BMI 21.53 kg/m   Physical Exam Vitals and nursing note reviewed.  Constitutional:      Appearance: Normal appearance.  HENT:     Head: Normocephalic and atraumatic.     Nose: Nose normal.     Mouth/Throat:     Mouth: Mucous membranes are moist.  Eyes:     Extraocular Movements: Extraocular movements intact.     Conjunctiva/sclera: Conjunctivae normal.  Cardiovascular:     Rate and Rhythm: Bradycardia present.  Pulmonary:     Effort: Pulmonary effort is normal.     Breath sounds: Normal breath sounds.  Abdominal:     General: Abdomen is flat.     Palpations: Abdomen is soft.     Tenderness: There is no abdominal tenderness.  Musculoskeletal:        General: No swelling. Normal range of motion.     Cervical back: Neck supple.  Skin:    General: Skin is warm and dry.  Neurological:     General: No focal deficit present.     Mental Status: She is alert.  Psychiatric:        Mood and Affect: Mood normal.      ED Results / Procedures / Treatments   Labs (all labs ordered are listed, but only abnormal results are displayed) Labs Reviewed  COMPREHENSIVE METABOLIC PANEL  CBC WITH DIFFERENTIAL/PLATELET  URINALYSIS, ROUTINE W REFLEX MICROSCOPIC  BRAIN NATRIURETIC PEPTIDE  TROPONIN I (HIGH SENSITIVITY)    EKG EKG Interpretation  Date/Time:  Sunday June 30 2020 14:23:16 EDT Ventricular Rate:  35 PR Interval:    QRS Duration: 107 QT Interval:  562 QTC Calculation: 429 R Axis:   -15 Text  Interpretation: Junctional rhythm Incomplete left bundle branch block Inferior infarct, acute (LCx) Consider anterolateral infarct Since last tracing Sinus rhythm P-waves no longer present Confirmed by Calvert Cantor [phone]) on 06/30/2020 2:53:48 PM   Radiology No results found.  Procedures Procedures  Medications Ordered in the ED Medications - No data to display   MDM Rules/Calculators/A&P MDM Reviewed EKG with Dr. Irish Lack, given no chest pain he does not recommend activating a STEMI. He thinks this is a  junctional escape rhythm. Recommends holding Amiodarone and betablocker. Will check labs and CXR in ED and continue to monitor. Disposition for admission here vs transfer to [facility] depending on results, HR and BP. As of now, patient is asymptomatic.  ED Course  I have reviewed the triage vital signs and the nursing notes.  Pertinent labs & imaging results that were available during my care of the patient were reviewed by me and considered in my medical decision making (see chart for details).  Clinical Course as of Jul 01 734  Sun Jun 30, 2020  1455 Care of the patient signed out to Dr. Regenia Skeeter at the change of shift.    [CS]  1457 CBC shows leukocytosis.    [CS]    Clinical Course User Index [CS] Truddie Hidden, MD    Final Clinical Impression(s) / ED Diagnoses Final diagnoses:  None    Rx / DC Orders ED Discharge Orders    None       Truddie Hidden, MD 06/30/20 1456    Truddie Hidden, MD 07/01/20 [phone]

## 2020-06-30 NOTE — ED Notes (Signed)
Patient is being discharged from the Urgent Care and sent to the Emergency Department via EMS . Per B Wurst , patient is in need of higher level of care due to abnormal EKG STEMI noted pt and is alert and oriented to elf and denies pain pt brought in for dysuria and abdominal pain, pt had diarrhea when attempting to obtain urine upon assessment of VS and unable to obtain, EKG performed . Patient is aware and verbalizes understanding of plan of care.  Vitals:   06/30/20 1320  Pulse: (S) (!) 49  Resp: 18  Temp: (!) 97.5 F (36.4 C)  SpO2: 90%

## 2020-06-30 NOTE — ED Provider Notes (Signed)
Care transferred to me.  Patient's blood work shows elevated BNP of 1200.  She has a potassium of 5.0 but also an acute kidney injury.  She appears to have fluid in her legs and some mild CHF on chest x-ray but also by report is having diarrhea so might be dehydrated.  This is a complicated case.  She remains bradycardic but not unstable and blood pressure seems to be okay.  Discussed with hospitalist who will admit for monitoring. Will need to hold rate control meds.    Sherwood Gambler, MD 06/30/20 1714

## 2020-06-30 NOTE — ED Notes (Signed)
Pt refusing to go to CT for her scans.

## 2020-06-30 NOTE — ED Triage Notes (Signed)
Pt brought in by [facility] with dysuria and foul smelling urine since yesterday

## 2020-07-01 ENCOUNTER — Encounter (HOSPITAL_COMMUNITY): Payer: Self-pay | Admitting: Internal Medicine

## 2020-07-01 DIAGNOSIS — I4891 Unspecified atrial fibrillation: Secondary | ICD-10-CM

## 2020-07-01 DIAGNOSIS — N179 Acute kidney failure, unspecified: Secondary | ICD-10-CM

## 2020-07-01 DIAGNOSIS — F0391 Unspecified dementia with behavioral disturbance: Secondary | ICD-10-CM

## 2020-07-01 DIAGNOSIS — I4811 Longstanding persistent atrial fibrillation: Secondary | ICD-10-CM

## 2020-07-01 DIAGNOSIS — E119 Type 2 diabetes mellitus without complications: Secondary | ICD-10-CM

## 2020-07-01 DIAGNOSIS — N390 Urinary tract infection, site not specified: Principal | ICD-10-CM

## 2020-07-01 LAB — BASIC METABOLIC PANEL
Anion gap: 11 (ref 5–15)
BUN: 49 mg/dL — ABNORMAL HIGH (ref 8–23)
CO2: 24 mmol/L (ref 22–32)
Calcium: 8.2 mg/dL — ABNORMAL LOW (ref 8.9–10.3)
Chloride: 100 mmol/L (ref 98–111)
Creatinine, Ser: 1.87 mg/dL — ABNORMAL HIGH (ref 0.44–1.00)
GFR, Estimated: 25 mL/min — ABNORMAL LOW (ref >60)
Glucose, Bld: 182 mg/dL — ABNORMAL HIGH (ref 70–99)
Potassium: 4.3 mmol/L (ref 3.5–5.1)
Sodium: 135 mmol/L (ref 135–145)

## 2020-07-01 LAB — CBC
HCT: 29.9 % — ABNORMAL LOW (ref 36.0–46.0)
Hemoglobin: 8.9 g/dL — ABNORMAL LOW (ref 12.0–15.0)
MCH: 26.6 pg (ref 26.0–34.0)
MCHC: 29.8 g/dL — ABNORMAL LOW (ref 30.0–36.0)
MCV: 89.3 fL (ref 80.0–100.0)
Platelets: 220 10*3/uL (ref 150–400)
RBC: 3.35 MIL/uL — ABNORMAL LOW (ref 3.87–5.11)
RDW: 15.9 % — ABNORMAL HIGH (ref 11.5–15.5)
WBC: 15.7 10*3/uL — ABNORMAL HIGH (ref 4.0–10.5)
nRBC: 0 % (ref 0.0–0.2)

## 2020-07-01 LAB — GLUCOSE, CAPILLARY
Glucose-Capillary: 159 mg/dL — ABNORMAL HIGH (ref 70–99)
Glucose-Capillary: 135 mg/dL — ABNORMAL HIGH (ref 70–99)
Glucose-Capillary: 137 mg/dL — ABNORMAL HIGH (ref 70–99)
Glucose-Capillary: 179 mg/dL — ABNORMAL HIGH (ref 70–99)

## 2020-07-01 LAB — C DIFFICILE QUICK SCREEN W PCR REFLEX
C Diff antigen: NEGATIVE
C Diff interpretation: NOT DETECTED
C Diff toxin: NEGATIVE

## 2020-07-01 LAB — MRSA PCR SCREENING: MRSA by PCR: NEGATIVE

## 2020-07-01 MED ORDER — FENTANYL CITRATE (PF) 100 MCG/2ML IJ SOLN
12.5000 ug | Freq: Once | INTRAMUSCULAR | Status: AC
  Administered 2020-07-01: 12.5 ug via INTRAVENOUS
  Filled 2020-07-01: qty 2

## 2020-07-01 MED ORDER — HALOPERIDOL LACTATE 5 MG/ML IJ SOLN
2.0000 mg | Freq: Once | INTRAMUSCULAR | Status: AC
  Administered 2020-07-01: 2 mg via INTRAVENOUS
  Filled 2020-07-01: qty 1

## 2020-07-01 MED ORDER — CHLORHEXIDINE GLUCONATE CLOTH 2 % EX PADS
6.0000 | MEDICATED_PAD | Freq: Every day | CUTANEOUS | Status: DC
  Administered 2020-07-01 – 2020-07-04 (×4): 6 via TOPICAL

## 2020-07-01 NOTE — Progress Notes (Addendum)
[facility]  PROGRESS NOTE  Amy Hoffman MWN:[id number] DOB: 11/26/1940 DOA: 06/30/2020 PCP: [name], NP Admit date - 06/30/2020   Admitting Physician Bethena Roys, MD  Outpatient Primary MD for the patient is [name], NP  LOS - 1 Brief Narrative  Ms. Amy Hoffman is a 79 year old female with medical history significant for A. fib, dementia, diastolic CHF, diabetes, HTN, HLD who presented to the ED on 10/17 by her caregiver with concerns for difficulty urinating, malodorous urine, diarrhea and reported bradycardia after a bowel movement while being evaluated in urgent care center.  Patient was admitted with presumed diagnosis of symptomatic bradycardia in setting of UTI and diarrhea complicated by AKI  In the ED patient was found to have heart rate as well as 35 with creatinine of 1.64, sodium of 133, glucose 244, BUN 45, WBC 19.1 with unremarkable troponin trend, BNP greater than 1200 and chest x-ray consistent with CHF without edema.  Initial concern for STEMI at urgent care center however upon evaluation of EKG showed bradycardia with junctional rhythm which was confirmed by cardiology evaluation.  Patient's amiodarone and metoprolol has been held with improvement in her bradycardia.  CT head was unremarkable.  Patient started on IV ceftriaxone for UTI.  Subjective  Eating breakfast.  Has no acute complaints. A & P    Symptomatic bradycardia, improving.  Heart rate overnight in the 40s to 50s this morning at appropriate rate of 50s to 60s.  Has improved with holding of her home amiodarone/metoprolol.  Suspect also worse in the setting of hypotension like related to diarrhea as well as UTI -Continue to hold home amiodarone/metoprolol -Monitor on telemetry -Patient is on Aricept for her dementia -Cardiology consulted  Acute on chronic Diarrhea.  Had episode prior to admission.  No diarrhea currently -Continue to monitor -C. difficile if sample able  UTI.  Remains  afebrile UA consistent with infection.  Peak white count of 19.1, currently 15.7 -Continue IV ceftriaxone while awaiting urine cultures  AKI, likely prerenal in the setting of hypotension related to bradycardia possibly further complicated by volume loss from diarrhea as well as infection -Baseline creatinine less than 1 creatinine still uptrending 1.8 currently but has adequate output -Monitor ins and outs -Repeat BMP in a.m. -Continued timed IV fluids, encourage oral intake  Metabolic encephalopathy with history of dementia with behavioral disturbances, significantly improved.  Likely exacerbated in the setting of UTI, AKI, bradycardia.  Reported baseline able to have simple conversation, seems close to baseline today able to converse with me.  CT head nonacute -Continue home Aricept and BuSpar, delirium precautions  Atrial fibrillation.  Heart rate much improved, in normal sinus rhythm.  Did present with bradycardia. -Continue home Eliquis -Holding home amiodarone and metoprolol until further recommendations by cardiology  Chronic hypoxic respiratory failure, stable on home O2 of 2 L Diastolic CHF.  EF 50 to 55% on TTE on 4/21.  Presented with BNP greater than 1200 but on exam actually's volume down in the setting of AKI/hypotension/diarrhea with no volume overload. -Continue supplemental O2 -Gentle IV fluids, closely monitor volume status  Hypertension.  Had some transient hypotension likely in the setting of diminished oral intake as well as bradycardia.  Currently blood pressure 403K to 742V systolic -Holding home amlodipine -Gentle hydration  Type 2 diabetes with hyperglycemia.  Glucose much better controlled this morning, A1c 8.3 (03/2019 -Holding home Januvia -Monitor CBG, sliding scale as needed     Family Communication  : We will update  daughter  Code Status : DNR as discussed on day of admission  Disposition Plan  :  Patient is from home. Anticipated d/c date: 2 to 3  days. Barriers to d/c or necessity for inpatient status:  Still requiring IV fluids for AKI, close monitoring of kidney function as well as close monitoring of heart rate and on IV ceftriaxone Consults  : Cardiology  Procedures  : None  DVT Prophylaxis  : Eliquis  MDM: The below labs and imaging reports were reviewed and summarized above.  Medication management as above.  Lab Results  Component Value Date   PLT 220 07/01/2020    Diet :  Diet Order            Diet heart healthy/carb modified Room service appropriate? Yes; Fluid consistency: Thin  Diet effective now                  Inpatient Medications Scheduled Meds: . anastrozole  1 mg Oral Daily  . apixaban  2.5 mg Oral BID  . busPIRone  5 mg Oral BID  . Chlorhexidine Gluconate Cloth  6 each Topical Daily  . donepezil  5 mg Oral QHS  . haloperidol lactate  5 mg Intramuscular Once  . insulin aspart  0-5 Units Subcutaneous QHS  . insulin aspart  0-9 Units Subcutaneous TID WC   Continuous Infusions: . sodium chloride 100 mL/hr at 07/01/20 0821  . cefTRIAXone (ROCEPHIN)  IV Stopped (06/30/20 1900)   PRN Meds:.acetaminophen **OR** acetaminophen, ondansetron **OR** ondansetron (ZOFRAN) IV  Antibiotics  :   Anti-infectives (From admission, onward)   Start     Dose/Rate Route Frequency Ordered Stop   06/30/20 1830  cefTRIAXone (ROCEPHIN) 1 g in sodium chloride 0.9 % 100 mL IVPB        1 g 200 mL/hr over 30 Minutes Intravenous Every 24 hours 06/30/20 1829         Objective   Vitals:   07/01/20 0728 07/01/20 0800 07/01/20 0900 07/01/20 1000  BP:  (!) 137/56 129/87 (!) 126/39  Pulse: (!) 56 (!) 56 61 (!) 50  Resp: 16 20 17 17   Temp: 98.6 F (37 C)     TempSrc: Oral     SpO2: 94% 97% 100% 95%  Weight:      Height:        SpO2: 95 % O2 Flow Rate (L/min): 2 L/min  Wt Readings from Last 3 Encounters:  07/01/20 59.4 kg  06/27/20 58.7 kg  06/13/20 59.3 kg     Intake/Output Summary (Last 24 hours) at  07/01/2020 1056 Last data filed at 06/30/2020 1900 Gross per 24 hour  Intake 100 ml  Output --  Net 100 ml    Physical Exam:  Awake, oriented to self.  Continues to repeat okay in between questions, able to answer simple yes/no questions appropriately and follows commands without any appreciable focal deficits Normal respiratory effort on 2 L Bradycardic (in the 50s), normal rhythm, no peripheral edema noted, compression stockings in place Abdomen soft, nondistended, nontender     I have personally reviewed the following:   Data Reviewed:  CBC Recent Labs  Lab 06/30/20 1441 07/01/20 0542  WBC 19.1* 15.7*  HGB 10.0* 8.9*  HCT 33.2* 29.9*  PLT 234 220  MCV 88.5 89.3  MCH 26.7 26.6  MCHC 30.1 29.8*  RDW 15.8* 15.9*  LYMPHSABS 0.5*  --   MONOABS 1.7*  --   EOSABS 0.0  --   BASOSABS 0.0  --  Chemistries  Recent Labs  Lab 06/30/20 1441 06/30/20 1636 07/01/20 0542  NA 133*  --  135  K 5.0  --  4.3  CL 95*  --  100  CO2 24  --  24  GLUCOSE 344*  --  182*  BUN 45*  --  49*  CREATININE 1.64*  --  1.87*  CALCIUM 8.8*  --  8.2*  MG  --  2.5*  --   AST 34  --   --   ALT 40  --   --   ALKPHOS 126  --   --   BILITOT 1.4*  --   --    ------------------------------------------------------------------------------------------------------------------ No results for input(s): CHOL, HDL, LDLCALC, TRIG, CHOLHDL, LDLDIRECT in the last 72 hours.  Lab Results  Component Value Date   HGBA1C 8.3 (H) 04/03/2020   ------------------------------------------------------------------------------------------------------------------ Recent Labs    06/30/20 1430  TSH 2.558   ------------------------------------------------------------------------------------------------------------------ No results for input(s): VITAMINB12, FOLATE, FERRITIN, TIBC, IRON, RETICCTPCT in the last 72 hours.  Coagulation profile No results for input(s): INR, PROTIME in the last 168 hours.  No  results for input(s): DDIMER in the last 72 hours.  Cardiac Enzymes No results for input(s): CKMB, TROPONINI, MYOGLOBIN in the last 168 hours.  Invalid input(s): CK ------------------------------------------------------------------------------------------------------------------    Component Value Date/Time   BNP 1,208.0 (H) 06/30/2020 1443    Micro Results Recent Results (from the past 240 hour(s))  Respiratory Panel by RT PCR (Flu A&B, Covid) - Nasopharyngeal Swab     Status: None   Collection Time: 06/30/20  3:20 PM   Specimen: Nasopharyngeal Swab  Result Value Ref Range Status   SARS Coronavirus 2 by RT PCR NEGATIVE NEGATIVE Final    Comment: (NOTE) SARS-CoV-2 target nucleic acids are NOT DETECTED.  The SARS-CoV-2 RNA is generally detectable in upper respiratoy specimens during the acute phase of infection. The lowest concentration of SARS-CoV-2 viral copies this assay can detect is 131 copies/mL. A negative result does not preclude SARS-Cov-2 infection and should not be used as the sole basis for treatment or other patient management decisions. A negative result may occur with  improper specimen collection/handling, submission of specimen other than nasopharyngeal swab, presence of viral mutation(s) within the areas targeted by this assay, and inadequate number of viral copies (<131 copies/mL). A negative result must be combined with clinical observations, patient history, and epidemiological information. The expected result is Negative.  Fact Sheet for Patients:  PinkCheek.be  Fact Sheet for Healthcare Providers:  GravelBags.it  This test is no t yet approved or cleared by the [place] FDA and  has been authorized for detection and/or diagnosis of SARS-CoV-2 by FDA under an Emergency Use Authorization (EUA). This EUA will remain  in effect (meaning this test can be used) for the duration of the COVID-19  declaration under Section 564(b)(1) of the Act, 21 U.S.C. section 360bbb-3(b)(1), unless the authorization is terminated or revoked sooner.     Influenza A by PCR NEGATIVE NEGATIVE Final   Influenza B by PCR NEGATIVE NEGATIVE Final    Comment: (NOTE) The Xpert Xpress SARS-CoV-2/FLU/RSV assay is intended as an aid in  the diagnosis of influenza from Nasopharyngeal swab specimens and  should not be used as a sole basis for treatment. Nasal washings and  aspirates are unacceptable for Xpert Xpress SARS-CoV-2/FLU/RSV  testing.  Fact Sheet for Patients: PinkCheek.be  Fact Sheet for Healthcare Providers: GravelBags.it  This test is not yet approved or cleared by the [place]  FDA and  has been authorized for detection and/or diagnosis of SARS-CoV-2 by  FDA under an Emergency Use Authorization (EUA). This EUA will remain  in effect (meaning this test can be used) for the duration of the  Covid-19 declaration under Section 564(b)(1) of the Act, 21  U.S.C. section 360bbb-3(b)(1), unless the authorization is  terminated or revoked. Performed at [facility], [address] [place], [place] [address]   MRSA PCR Screening     Status: None   Collection Time: 07/01/20  5:20 AM   Specimen: Nasal Mucosa; Nasopharyngeal  Result Value Ref Range Status   MRSA by PCR NEGATIVE NEGATIVE Final    Comment:        The GeneXpert MRSA Assay (FDA approved for NASAL specimens only), is one component of a comprehensive MRSA colonization surveillance program. It is not intended to diagnose MRSA infection nor to guide or monitor treatment for MRSA infections. Performed at [facility], [address] [place], [place] [address]     Radiology Reports DG Chest 2 View  Result Date: 06/30/2020 CLINICAL DATA:  Foul-smelling urine and abnormal EKG EXAM: CHEST - 2 VIEW COMPARISON:  02/13/2020 FINDINGS: Cardiac shadow is enlarged. Aortic  calcifications are noted. Lungs are well aerated bilaterally. Chronic blunting of the right costophrenic angle is noted. No new focal infiltrate is seen. Mild central vascular congestion is noted. IMPRESSION: Changes of mild CHF without edema. Chronic changes in the right base. Electronically Signed   By: Inez Catalina M.D.   On: 06/30/2020 15:32   CT HEAD WO CONTRAST  Result Date: 07/01/2020 CLINICAL DATA:  Altered mental status on top of baseline dementia. Chronic anticoagulation. EXAM: CT HEAD WITHOUT CONTRAST TECHNIQUE: Contiguous axial images were obtained from the base of the skull through the vertex without intravenous contrast. COMPARISON:  None. FINDINGS: Brain: Examination is technically limited by motion artifact. There is evidence of diffuse cerebral atrophy. Ventricular dilatation is likely due to central atrophy. Low-attenuation changes in the deep white matter likely represent small vessel ischemic change. No obvious mass effect or midline shift. No acute intracranial hemorrhage is demonstrated. No abnormal extra-axial fluid collections. Vascular: No acute vascular abnormality is identified. Skull: Visualized calvarium appears intact. Sinuses/Orbits: Visualized paranasal sinuses and mastoid air cells are clear. Other: None. IMPRESSION: 1. Technically limited study due to motion artifact. 2. No acute intracranial abnormalities are demonstrated. 3. Chronic atrophy and small vessel ischemic changes. Electronically Signed   By: Lucienne Capers M.D.   On: 07/01/2020 00:43     Time Spent in minutes  30     Desiree Hane M.D on 07/01/2020 at 10:56 AM  To [place] go to [email or web address] - password [facility]

## 2020-07-01 NOTE — TOC Initial Note (Signed)
Transition of Care [facility]) - Initial/Assessment Note    Patient Details  Name: Amy Hoffman MRN: [id number] Date of Birth: 1941/08/08  Transition of [facility]) CM/SW Contact:    Salome Arnt, LCSW Phone Number: 07/01/2020, 12:09 PM  Clinical Narrative:  Pt admitted due to bradycardia. LCSW completed assessment with pt's husband due to pt's dementia. Husband reports that she recently returned home from [facility]. They have a private duty caregiver for 12 hours a day, 7 days a week. Pt requires assist with ADLs. She ambulates using walker. [facility] is active with [facility]. LCSW notified Melissa with [facility] of admission. Will need resumption orders at d/c. LCSW also spoke with pt's caregiver, Margarita Grizzle. Plan is for pt to return home when medically stable. TOC will continue to follow.                  Expected Discharge Plan: [place] Barriers to Discharge: Continued Medical Work up   Patient Goals and CMS Choice Patient states their goals for this hospitalization and ongoing recovery are:: return home with home health   Choice offered to / list presented to : Spouse  Expected Discharge Plan and Services Expected Discharge Plan: [place] In-house Referral: Clinical Social Work   Post Acute Care Choice: Resumption of Svcs/PTA Provider Living arrangements for the past 2 months: [place] Expected Discharge Date: 07/04/20                         HH Arranged: PT, Nurse's Aide HH Agency: [place] ([facility]) Date HH Agency Contacted: 07/01/20 Time HH Agency Contacted: 1209 Representative spoke with at [place]: [place] Arrangements/Services Living arrangements for the past 2 months: [place] with:: Spouse Patient language and need for interpreter reviewed:: Yes Do you feel safe going back to the place where you live?: Yes      Need for Family Participation in Patient Care: Yes  (Comment) Care giver support system in place?: Yes (comment) Current home services: DME, Home PT, Sitter (walker, wheelchair, 3N1) Criminal Activity/Legal Involvement Pertinent to Current Situation/Hospitalization: No - Comment as needed  Activities of Daily Living Home Assistive Devices/Equipment: Wheelchair, Environmental consultant (specify type) ADL Screening (condition at time of admission) Patient's cognitive ability adequate to safely complete daily activities?: No Is the patient deaf or have difficulty hearing?: No Does the patient have difficulty seeing, even when wearing glasses/contacts?: No Does the patient have difficulty concentrating, remembering, or making decisions?: Yes Patient able to express need for assistance with ADLs?: Yes Does the patient have difficulty dressing or bathing?: Yes Independently performs ADLs?: No Communication: Independent Dressing (OT): Needs assistance Is this a change from baseline?: Pre-admission baseline Grooming: Needs assistance Is this a change from baseline?: Pre-admission baseline Feeding: Independent Is this a change from baseline?: Pre-admission baseline Bathing: Needs assistance Is this a change from baseline?: Pre-admission baseline Toileting: Needs assistance Is this a change from baseline?: Pre-admission baseline In/Out Bed: Needs assistance Is this a change from baseline?: Pre-admission baseline Walks in Home: Needs assistance Is this a change from baseline?: Pre-admission baseline Does the patient have difficulty walking or climbing stairs?: Yes Weakness of Legs: None Weakness of Arms/Hands: None  Permission Sought/Granted                  Emotional Assessment   Attitude/Demeanor/Rapport: Unable to Assess Affect (typically observed): Unable to Assess Orientation: :  Oriented to Self, Oriented to Place Alcohol / Substance Use: Not Applicable Psych Involvement: No (comment)  Admission diagnosis:  Bradycardia [R00.1] Patient  Active Problem List   Diagnosis Date Noted  . Acute lower UTI 07/01/2020  . Bradycardia 06/30/2020  . 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 Chronic constipation 04/23/2020  . Chronic respiratory failure with hypoxia ([place]) 03/09/2020  . AKI (acute kidney injury) ([place]) 03/01/2020  . Controlled type 2 diabetes mellitus without complication, without long-term current use of insulin ([place]) 02/26/2020  . Hypertensive heart disease with chronic diastolic congestive heart failure ([place]) 02/16/2020  . Hyperlipidemia associated with type 2 diabetes mellitus ([place]) 02/16/2020  . 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 Do not resuscitate status   . CHF (congestive heart failure) ([place]) 02/05/2020  . Hypokalemia   . Medicare annual wellness visit, subsequent   . Dementia with behavioral disturbance ([place])   . Atrial fibrillation ([place]) 12/16/2019  . Hyperlipidemia, unspecified   . Allergic rhinitis due to pollen   . Major depressive disorder, single episode, unspecified   . Unspecified asthma, uncomplicated   . Dysphagia 07/10/2019  . Breast CA ([place]) 01/30/2014   PCP:  [name], NP Pharmacy:   [place] [place] [facility] [place] [address] Phone: [phone] Fax: [phone]  [facility] [phone] - [place], [place] AT [place] [address] [place] [address] Phone: [phone] Fax: [phone]     Social Determinants of Health (SDOH) Interventions    Readmission Risk Interventions Readmission Risk Prevention Plan 07/01/2020 02/13/2020 02/06/2020  Transportation Screening Complete Complete Complete  Medication Review (RN Care Manager) Complete Complete Complete  PCP or Specialist appointment within 3-5 days of discharge - Not Complete Not Complete  HRI or Home Care Consult Complete Not Complete -  SW Recovery Care/Counseling Consult Complete Complete -   Palliative Care Screening Not Applicable Complete -  [place] Not Applicable Complete -  Some recent data might be hidden

## 2020-07-02 DIAGNOSIS — I110 Hypertensive heart disease with heart failure: Secondary | ICD-10-CM

## 2020-07-02 DIAGNOSIS — R001 Bradycardia, unspecified: Secondary | ICD-10-CM

## 2020-07-02 DIAGNOSIS — I5032 Chronic diastolic (congestive) heart failure: Secondary | ICD-10-CM

## 2020-07-02 DIAGNOSIS — J9611 Chronic respiratory failure with hypoxia: Secondary | ICD-10-CM

## 2020-07-02 LAB — CBC WITH DIFFERENTIAL/PLATELET
Abs Immature Granulocytes: 0.04 10*3/uL (ref 0.00–0.07)
Basophils Absolute: 0 10*3/uL (ref 0.0–0.1)
Basophils Relative: 0 %
Eosinophils Absolute: 0 10*3/uL (ref 0.0–0.5)
Eosinophils Relative: 0 %
HCT: 29 % — ABNORMAL LOW (ref 36.0–46.0)
Hemoglobin: 8.5 g/dL — ABNORMAL LOW (ref 12.0–15.0)
Immature Granulocytes: 0 %
Lymphocytes Relative: 7 %
Lymphs Abs: 0.6 10*3/uL — ABNORMAL LOW (ref 0.7–4.0)
MCH: 26 pg (ref 26.0–34.0)
MCHC: 29.3 g/dL — ABNORMAL LOW (ref 30.0–36.0)
MCV: 88.7 fL (ref 80.0–100.0)
Monocytes Absolute: 0.8 10*3/uL (ref 0.1–1.0)
Monocytes Relative: 9 %
Neutro Abs: 7.8 10*3/uL — ABNORMAL HIGH (ref 1.7–7.7)
Neutrophils Relative %: 84 %
Platelets: 217 10*3/uL (ref 150–400)
RBC: 3.27 MIL/uL — ABNORMAL LOW (ref 3.87–5.11)
RDW: 15.8 % — ABNORMAL HIGH (ref 11.5–15.5)
WBC: 9.3 10*3/uL (ref 4.0–10.5)
nRBC: 0 % (ref 0.0–0.2)

## 2020-07-02 LAB — BASIC METABOLIC PANEL
Anion gap: 10 (ref 5–15)
BUN: 57 mg/dL — ABNORMAL HIGH (ref 8–23)
CO2: 24 mmol/L (ref 22–32)
Calcium: 8.1 mg/dL — ABNORMAL LOW (ref 8.9–10.3)
Chloride: 102 mmol/L (ref 98–111)
Creatinine, Ser: 1.85 mg/dL — ABNORMAL HIGH (ref 0.44–1.00)
GFR, Estimated: 25 mL/min — ABNORMAL LOW (ref >60)
Glucose, Bld: 134 mg/dL — ABNORMAL HIGH (ref 70–99)
Potassium: 3.9 mmol/L (ref 3.5–5.1)
Sodium: 136 mmol/L (ref 135–145)

## 2020-07-02 LAB — GLUCOSE, CAPILLARY
Glucose-Capillary: 132 mg/dL — ABNORMAL HIGH (ref 70–99)
Glucose-Capillary: 125 mg/dL — ABNORMAL HIGH (ref 70–99)
Glucose-Capillary: 156 mg/dL — ABNORMAL HIGH (ref 70–99)
Glucose-Capillary: 153 mg/dL — ABNORMAL HIGH (ref 70–99)

## 2020-07-02 LAB — HEMOGLOBIN A1C
Hgb A1c MFr Bld: 7.7 % — ABNORMAL HIGH (ref 4.8–5.6)
Mean Plasma Glucose: 174 mg/dL

## 2020-07-02 MED ORDER — FENTANYL CITRATE (PF) 100 MCG/2ML IJ SOLN
12.5000 ug | Freq: Once | INTRAMUSCULAR | Status: AC
  Administered 2020-07-03: 12.5 ug via INTRAVENOUS
  Filled 2020-07-02: qty 2

## 2020-07-02 MED ORDER — QUETIAPINE FUMARATE 25 MG PO TABS
12.5000 mg | ORAL_TABLET | Freq: Every day | ORAL | Status: DC
  Administered 2020-07-02 – 2020-07-03 (×2): 12.5 mg via ORAL
  Filled 2020-07-02 (×2): qty 1

## 2020-07-02 MED ORDER — FENTANYL CITRATE (PF) 100 MCG/2ML IJ SOLN
12.5000 ug | Freq: Once | INTRAMUSCULAR | Status: AC
  Administered 2020-07-02: 12.5 ug via INTRAVENOUS
  Filled 2020-07-02: qty 2

## 2020-07-02 MED ORDER — FENTANYL CITRATE (PF) 100 MCG/2ML IJ SOLN: 12.5000 ug | Freq: Once | INTRAMUSCULAR | Status: DC

## 2020-07-02 MED ORDER — METOPROLOL TARTRATE 5 MG/5ML IV SOLN
5.0000 mg | Freq: Once | INTRAVENOUS | Status: AC
  Administered 2020-07-03: 5 mg via INTRAVENOUS
  Filled 2020-07-02: qty 5

## 2020-07-02 MED ORDER — HALOPERIDOL LACTATE 5 MG/ML IJ SOLN
2.0000 mg | Freq: Once | INTRAMUSCULAR | Status: AC
  Administered 2020-07-02: 2 mg via INTRAVENOUS
  Filled 2020-07-02: qty 1

## 2020-07-02 MED ORDER — HALOPERIDOL LACTATE 5 MG/ML IJ SOLN
2.0000 mg | Freq: Four times a day (QID) | INTRAMUSCULAR | Status: AC | PRN
  Administered 2020-07-02: 2 mg via INTRAVENOUS
  Filled 2020-07-02: qty 1

## 2020-07-02 MED ORDER — AMIODARONE HCL 200 MG PO TABS
200.0000 mg | ORAL_TABLET | Freq: Every day | ORAL | Status: DC
  Administered 2020-07-02 – 2020-07-04 (×3): 200 mg via ORAL
  Filled 2020-07-02 (×3): qty 1

## 2020-07-02 NOTE — Progress Notes (Signed)
[facility]  PROGRESS NOTE  Amy Hoffman BHA:[id number] DOB: 11/20/1940 DOA: 06/30/2020 PCP: [name], NP Admit date - 06/30/2020   Admitting Physician Bethena Roys, MD  Outpatient Primary MD for the patient is [name], NP  LOS - 2 Brief Narrative  Amy Hoffman is a 79 year old female with medical history significant for A. fib, dementia, diastolic CHF, diabetes, HTN, HLD who presented to the ED on 10/17 by her caregiver with concerns for difficulty urinating, malodorous urine, diarrhea and reported bradycardia after a bowel movement while being evaluated in urgent care center.  Patient was admitted with presumed diagnosis of symptomatic bradycardia in setting of UTI and diarrhea complicated by AKI  In the ED patient was found to have heart rate as well as 35 with creatinine of 1.64, sodium of 133, glucose 244, BUN 45, WBC 19.1 with unremarkable troponin trend, BNP greater than 1200 and chest x-ray consistent with CHF without edema.  Initial concern for STEMI at urgent care center however upon evaluation of EKG showed bradycardia with junctional rhythm which was confirmed by cardiology evaluation.  Patient's amiodarone and metoprolol has been held with improvement in her bradycardia.  CT head was unremarkable.  Patient started on IV ceftriaxone for UTI.  Subjective  Overnight patient required Haldol and fentanyl for agitation.  This morning patient again became agitated when tolerating it was noted to have a initial heart rate in the 140s by nursing.  Shortly after patient's heart rate improved without any medical intervention to the 110s.  She denies any pain.   A & P    Symptomatic bradycardia, improving.  Improvement in bradycardia with holding of her home amiodarone/metoprolol but now having intermittent RVR with her A. fib with heart rate as high as 140s currently in the low 110s.  -Continue to hold home amiodarone/metoprolol, will await recommendations by  cardiology -Monitor on telemetry -Patient is on Aricept for her dementia  Atrial fibrillation with intermittent RVR.  Heart rate in the 140s this a.m. but improved without intervention, heart rates in the 1 teens-120s.  Her rhythm and rate control has been on hold due to bradycardia on admission -Continue home Eliquis -Holding home amiodarone and metoprolol until further recommendations by cardiology,  IV Lopressor x1  Acute on chronic Diarrhea.  Had episode prior to admission.  No diarrhea currently -Continue to monitor -C. difficile if sample able  UTI.  Remains afebrile UA consistent with infection.  Peak white count of 19.1, WBC now within normal limits -Continue IV ceftriaxone while awaiting urine cultures (gram-negative rods awaiting further specification)  AKI, likely prerenal in the setting of hypotension related to bradycardia possibly further complicated by volume loss from diarrhea as well as infection.  No longer hypotensive.  Completed IV fluids.  In and out review only shows intake with no noted output (suspect possible inaccuracy -Baseline creatinine less than 1 creatinine currently stable at 1.8 currently but has adequate output -Monitor ins and outs, check bladder scan if creatinine continues to worsen next 24 hours will obtain renal ultrasound -Monitor BMP -Continued timed IV fluids, encourage oral intake  Metabolic encephalopathy with history of dementia with behavioral disturbances, significantly improved.  Likely exacerbated in the setting of UTI, AKI, bradycardia.  Reported baseline able to have simple conversation, seems close to baseline today able to converse with me.  CT head nonacute.  She has had bouts of agitation which likely consistent with her behavior disturbance with known dementia and has required Haldol x2 in  the last 24 hours -We will add low-dose Seroquel continue close monitoring delirium precautions -Continue home Aricept and BuSpar, delirium  precautions  Chronic hypoxic respiratory failure, stable on home O2 of 2 L Diastolic CHF.  EF 50 to 55% on TTE on 4/21.  Presented with BNP greater than 1200 but on exam actually's volume down in the setting of AKI/hypotension/diarrhea with no volume overload. -Continue supplemental O2 -Gentle IV fluids, closely monitor volume status  Hypertension.  Had some transient hypotension likely in the setting of diminished oral intake as well as bradycardia on admission currently blood pressure In the 409W systolic in the setting of agitation.  -Holding home amlodipine--should likely be able to restart but will monitor blood pressure after IV Lopressor for RVR -Gentle hydration  Type 2 diabetes with hyperglycemia.  Glucose much better controlled this morning, A1c 8.3 (03/2020) -Holding home Januvia -Monitor CBG, sliding scale as needed     Family Communication  : Updated daughter on phone on 10/18, plan to update today  Code Status : DNR as discussed on day of admission  Disposition Plan  :  Patient is from home. Anticipated d/c date: 2 to 3 days. Barriers to d/c or necessity for inpatient status:  IV ceftriaxone while awaiting urine cultures, RVR control and avoiding recurrent bradycardia pending cardiology recommendations, continue monitor kidney function Consults  : Cardiology  Procedures  : None  DVT Prophylaxis  : Eliquis  MDM: The below labs and imaging reports were reviewed and summarized above.  Medication management as above.  Lab Results  Component Value Date   PLT 217 07/02/2020    Diet :  Diet Order            Diet heart healthy/carb modified Room service appropriate? Yes; Fluid consistency: Thin  Diet effective now                  Inpatient Medications Scheduled Meds: . anastrozole  1 mg Oral Daily  . apixaban  2.5 mg Oral BID  . busPIRone  5 mg Oral BID  . Chlorhexidine Gluconate Cloth  6 each Topical Daily  . donepezil  5 mg Oral QHS  . insulin aspart  0-5  Units Subcutaneous QHS  . insulin aspart  0-9 Units Subcutaneous TID WC  . metoprolol tartrate  5 mg Intravenous Once   Continuous Infusions: . cefTRIAXone (ROCEPHIN)  IV 1 g (07/01/20 1826)   PRN Meds:.acetaminophen **OR** acetaminophen, ondansetron **OR** ondansetron (ZOFRAN) IV  Antibiotics  :   Anti-infectives (From admission, onward)   Start     Dose/Rate Route Frequency Ordered Stop   06/30/20 1830  cefTRIAXone (ROCEPHIN) 1 g in sodium chloride 0.9 % 100 mL IVPB        1 g 200 mL/hr over 30 Minutes Intravenous Every 24 hours 06/30/20 1829         Objective   Vitals:   07/02/20 0722 07/02/20 0730 07/02/20 0800 07/02/20 0808  BP:   (!) 177/81   Pulse: (!) 107 (!) 102 (!) 145 (!) 118  Resp: (!) 26 14 (!) 22 16  Temp:      TempSrc:      SpO2: 91% 93% (!) 81% 95%  Weight:      Height:        SpO2: 95 % O2 Flow Rate (L/min): 0 L/min  Wt Readings from Last 3 Encounters:  07/01/20 59.4 kg  06/27/20 58.7 kg  06/13/20 59.3 kg     Intake/Output Summary (Last 24  hours) at 07/02/2020 0834 Last data filed at 07/01/2020 1726 Gross per 24 hour  Intake 1756.77 ml  Output --  Net 1756.77 ml    Physical Exam:  Awake, oriented to self and place (able to state this is a hospital).  Continues to repeat okay in between questions, able to answer simple yes/no questions appropriately and follows commands without any appreciable focal deficits Normal respiratory effort on 2 L, no crackles appreciated  Tachycardic, irregularly irregular rhythm, no peripheral edema noted, compression stockings in place Abdomen soft, nondistended, nontender     I have personally reviewed the following:   Data Reviewed:  CBC Recent Labs  Lab 06/30/20 1441 07/01/20 0542 07/02/20 0430  WBC 19.1* 15.7* 9.3  HGB 10.0* 8.9* 8.5*  HCT 33.2* 29.9* 29.0*  PLT 234 220 217  MCV 88.5 89.3 88.7  MCH 26.7 26.6 26.0  MCHC 30.1 29.8* 29.3*  RDW 15.8* 15.9* 15.8*  LYMPHSABS 0.5*  --  0.6*   MONOABS 1.7*  --  0.8  EOSABS 0.0  --  0.0  BASOSABS 0.0  --  0.0    Chemistries  Recent Labs  Lab 06/30/20 1441 06/30/20 1636 07/01/20 0542 07/02/20 0430  NA 133*  --  135 136  K 5.0  --  4.3 3.9  CL 95*  --  100 102  CO2 24  --  24 24  GLUCOSE 344*  --  182* 134*  BUN 45*  --  49* 57*  CREATININE 1.64*  --  1.87* 1.85*  CALCIUM 8.8*  --  8.2* 8.1*  MG  --  2.5*  --   --   AST 34  --   --   --   ALT 40  --   --   --   ALKPHOS 126  --   --   --   BILITOT 1.4*  --   --   --    ------------------------------------------------------------------------------------------------------------------ No results for input(s): CHOL, HDL, LDLCALC, TRIG, CHOLHDL, LDLDIRECT in the last 72 hours.  Lab Results  Component Value Date   HGBA1C 7.7 (H) 06/30/2020   ------------------------------------------------------------------------------------------------------------------ Recent Labs    06/30/20 1430  TSH 2.558   ------------------------------------------------------------------------------------------------------------------ No results for input(s): VITAMINB12, FOLATE, FERRITIN, TIBC, IRON, RETICCTPCT in the last 72 hours.  Coagulation profile No results for input(s): INR, PROTIME in the last 168 hours.  No results for input(s): DDIMER in the last 72 hours.  Cardiac Enzymes No results for input(s): CKMB, TROPONINI, MYOGLOBIN in the last 168 hours.  Invalid input(s): CK ------------------------------------------------------------------------------------------------------------------    Component Value Date/Time   BNP 1,208.0 (H) 06/30/2020 1443    Micro Results Recent Results (from the past 240 hour(s))  Culture, Urine     Status: Abnormal (Preliminary result)   Collection Time: 06/30/20  2:42 PM   Specimen: Urine, Clean Catch  Result Value Ref Range Status   Specimen Description   Final    URINE, CLEAN CATCH Performed at [facility], [address]  [place], [place] [address]    Special Requests   Final    NONE Performed at [facility], [address], [place], [place] [address]    Culture (A)  Final    >=100,000 COLONIES/mL GRAM NEGATIVE RODS SUSCEPTIBILITIES TO FOLLOW Performed at [facility] [address] [place], [place] [address]    Report Status PENDING  Incomplete  Respiratory Panel by RT PCR (Flu A&B, Covid) - Nasopharyngeal Swab     Status: None   Collection  Time: 06/30/20  3:20 PM   Specimen: Nasopharyngeal Swab  Result Value Ref Range Status   SARS Coronavirus 2 by RT PCR NEGATIVE NEGATIVE Final    Comment: (NOTE) SARS-CoV-2 target nucleic acids are NOT DETECTED.  The SARS-CoV-2 RNA is generally detectable in upper respiratoy specimens during the acute phase of infection. The lowest concentration of SARS-CoV-2 viral copies this assay can detect is 131 copies/mL. A negative result does not preclude SARS-Cov-2 infection and should not be used as the sole basis for treatment or other patient management decisions. A negative result may occur with  improper specimen collection/handling, submission of specimen other than nasopharyngeal swab, presence of viral mutation(s) within the areas targeted by this assay, and inadequate number of viral copies (<131 copies/mL). A negative result must be combined with clinical observations, patient history, and epidemiological information. The expected result is Negative.  Fact Sheet for Patients:  PinkCheek.be  Fact Sheet for Healthcare Providers:  GravelBags.it  This test is no t yet approved or cleared by the [place] FDA and  has been authorized for detection and/or diagnosis of SARS-CoV-2 by FDA under an Emergency Use Authorization (EUA). This EUA will remain  in effect (meaning this test can be used) for the duration of the COVID-19 declaration under Section 564(b)(1) of the Act, 21 U.S.C. section  360bbb-3(b)(1), unless the authorization is terminated or revoked sooner.     Influenza A by PCR NEGATIVE NEGATIVE Final   Influenza B by PCR NEGATIVE NEGATIVE Final    Comment: (NOTE) The Xpert Xpress SARS-CoV-2/FLU/RSV assay is intended as an aid in  the diagnosis of influenza from Nasopharyngeal swab specimens and  should not be used as a sole basis for treatment. Nasal washings and  aspirates are unacceptable for Xpert Xpress SARS-CoV-2/FLU/RSV  testing.  Fact Sheet for Patients: PinkCheek.be  Fact Sheet for Healthcare Providers: GravelBags.it  This test is not yet approved or cleared by the [place] FDA and  has been authorized for detection and/or diagnosis of SARS-CoV-2 by  FDA under an Emergency Use Authorization (EUA). This EUA will remain  in effect (meaning this test can be used) for the duration of the  Covid-19 declaration under Section 564(b)(1) of the Act, 21  U.S.C. section 360bbb-3(b)(1), unless the authorization is  terminated or revoked. Performed at [facility], [address] [place], [place] [address]   MRSA PCR Screening     Status: None   Collection Time: 07/01/20  5:20 AM   Specimen: Nasal Mucosa; Nasopharyngeal  Result Value Ref Range Status   MRSA by PCR NEGATIVE NEGATIVE Final    Comment:        The GeneXpert MRSA Assay (FDA approved for NASAL specimens only), is one component of a comprehensive MRSA colonization surveillance program. It is not intended to diagnose MRSA infection nor to guide or monitor treatment for MRSA infections. Performed at [facility], [address] [place], [place] [address]   C Difficile Quick Screen w PCR reflex     Status: None   Collection Time: 07/01/20  6:00 PM   Specimen: STOOL  Result Value Ref Range Status   C Diff antigen NEGATIVE NEGATIVE Final   C Diff toxin NEGATIVE NEGATIVE Final   C Diff interpretation No C. difficile detected.  Final     Comment: Performed at [facility], [address] [place], [place] [address]    Radiology Reports DG Chest 2 View  Result Date: 06/30/2020 CLINICAL DATA:  Foul-smelling urine and abnormal EKG  EXAM: CHEST - 2 VIEW COMPARISON:  02/13/2020 FINDINGS: Cardiac shadow is enlarged. Aortic calcifications are noted. Lungs are well aerated bilaterally. Chronic blunting of the right costophrenic angle is noted. No new focal infiltrate is seen. Mild central vascular congestion is noted. IMPRESSION: Changes of mild CHF without edema. Chronic changes in the right base. Electronically Signed   By: Inez Catalina M.D.   On: 06/30/2020 15:32   CT HEAD WO CONTRAST  Result Date: 07/01/2020 CLINICAL DATA:  Altered mental status on top of baseline dementia. Chronic anticoagulation. EXAM: CT HEAD WITHOUT CONTRAST TECHNIQUE: Contiguous axial images were obtained from the base of the skull through the vertex without intravenous contrast. COMPARISON:  None. FINDINGS: Brain: Examination is technically limited by motion artifact. There is evidence of diffuse cerebral atrophy. Ventricular dilatation is likely due to central atrophy. Low-attenuation changes in the deep white matter likely represent small vessel ischemic change. No obvious mass effect or midline shift. No acute intracranial hemorrhage is demonstrated. No abnormal extra-axial fluid collections. Vascular: No acute vascular abnormality is identified. Skull: Visualized calvarium appears intact. Sinuses/Orbits: Visualized paranasal sinuses and mastoid air cells are clear. Other: None. IMPRESSION: 1. Technically limited study due to motion artifact. 2. No acute intracranial abnormalities are demonstrated. 3. Chronic atrophy and small vessel ischemic changes. Electronically Signed   By: Lucienne Capers M.D.   On: 07/01/2020 00:43     Time Spent in minutes  30     Desiree Hane M.D on 07/02/2020 at 8:34 AM  To Be[place] go to [email or web address] - password  [email or web address]

## 2020-07-02 NOTE — Progress Notes (Signed)
Pt is yelling out constantly and c/o pain, wanting help. She screams loudly and has woke and bothered other patients. She tells you she hurts and when you ask her where she will say nowhere. Then ask for help and tell you to leave her alone. She gets quite agitated during these outbursts to where she is uncooperative with staff. Earlier in the shift during an episode of this, the APP ordered fentanyl 12.66mcg IV and haldol 2mg  IV and this combination worked very well in helping her calm down, relax, get some rest, and quieted her concerns. She scored a 6 on the faces pain scale and that score was reduced significantly. Now she is doing the same thing. Contacted Dr. Clearence Ped who reordered the same combination of medicine that she received earlier. Pt again rates a 6 on the faces scale and is yelling with the same contradictory statements.

## 2020-07-02 NOTE — Consult Note (Addendum)
Cardiology Consult    Patient ID: Norell Brisbin Ashraf; [id number]; 1940-11-03   Admit date: 06/30/2020 Date of Consult: 07/02/2020  Primary Care Provider: [name], NP Primary Cardiologist: Carlyle Dolly, MD   Patient Profile    Oluwatamilore T Quinley is a 79 y.o. female with past medical history of paroxysmal atrial flutter (diagnosed in 12/2019), HTN, Type 2 DM, asthma, dementia and history of breast cancer who is being seen today for the evaluation of bradycardia at the request of Dr. Lonny Prude.   History of Present Illness    Ms. Munford was examined by the Cardiology service in 12/2019 for atrial flutter with RVR while admitted for an asthma exacerbation and PNA. She required IV Cardizem and initiation of IV Amiodarone and converted back to normal sinus rhythm with this. It was mentioned in the notes to only consider short-term use of Amiodarone given her atrial flutter was likely driven by her respiratory issues that admission. She did follow-up in the office with Ermalinda Barrios, PA-C later that month and was continued on her current regimen with follow-up arranged for 6 weeks but she has not been evaluated since.  She presented to Urgent Care from SNF on 06/30/2020 for evaluation of foul-smelling urine and was found to have an abnormal EKG. Code STEMI was initial called but her EKG showed junctional bradycardia, heart rate 35 with incomplete LBBB. Was reviewed with Dr. Irish Lack and EKG was without acute ST changes. Given her bradycardia, it was recommended to hold all rate controlling medications including Amiodarone 200mg  daily and Metoprolol 12.5mg  BID.  Initial labs showed WBC 19.1, Hgb 10.0, platelets 234, Na+ 133, K+ 5.0 and creatinine 1.64 (baseline 0.8 - 1.0). UA consistent with UTI. Initial HS Troponin 14 with repeat at 14. TSH 2.558. BNP 1208. COVID negative. CXR consistent with CHF. CT Head limited secondary to motion artifact but no acute intracranial abnormalities.   She was  started on IV Ceftriaxone on admission for her UTI. She received IVF on admission for hypotension and her AKI. IVF now held. Repeat labs this AM show her WBC has normalized to 9.3. Creatinine has trended upwards since admission, now at 1.85.  Overnight around 0200, she went into atrial fibrillation with RVR. In talking with the patient's nurse, she was very agitated at that time and received Haldol and Fentanyl. Rates are now in the 110's at rest, going into the 120's to 130's while talking.   She is unable to contribute to the history of what brought her in. Denies any current pain at this time. No palpitations. Says she slept well overnight and is hungry.    Past Medical History:  Diagnosis Date  . Abnormal uterine and vaginal bleeding, unspecified   . Allergic rhinitis due to pollen   . Anxiety   . Asthma   . Cancer [facility])    breast cancer - right  . Dementia ([place])   . Depression   . Diabetes mellitus without complication ([place])   . Essential (primary) hypertension   . Hyperlipidemia, unspecified   . Hypertension   . Major depressive disorder, single episode, unspecified   . Obesity, unspecified   . Pain in joint involving shoulder region   . Reflux esophagitis   . Trigger finger, acquired   . Type 2 diabetes mellitus with hyperglycemia ([place])   . Unspecified asthma with (acute) exacerbation   . 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 ([place])   . Unspecified osteoarthritis, unspecified site     Past Surgical History:  Procedure Laterality Date  . CHOLECYSTECTOMY    . MASTECTOMY Right   . RE-EXCISION OF BREAST LUMPECTOMY       Home Medications:  Prior to Admission medications   Medication Sig Start Date End Date Taking? Authorizing Provider  amiodarone (PACERONE) 200 MG tablet Take 1 tablet (200 mg total) by mouth daily. 06/27/20  Yes Gerlene Fee, NP  amLODipine (NORVASC) 5 MG tablet Take 1 tablet (5 mg total) by mouth daily. 06/27/20  Yes Gerlene Fee, NP   anastrozole (ARIMIDEX) 1 MG tablet Take 1 tablet (1 mg total) by mouth daily. 06/27/20  Yes Gerlene Fee, NP  apixaban (ELIQUIS) 5 MG TABS tablet Take 1 tablet (5 mg total) by mouth 2 (two) times daily. 06/27/20  Yes Gerlene Fee, NP  Balsam [place]-Cast[place] Oil [facility]) OINT Special Instructions: Apply to sacrum, bilateral buttocks and coccyx qshift for prevention. Every Shift Day, Evening, Night 03/01/20  Yes [provider]  busPIRone (BUSPAR) 5 MG tablet Take 1 tablet (5 mg total) by mouth 2 (two) times daily. 06/27/20  Yes Gerlene Fee, NP  donepezil (ARICEPT) 5 MG tablet Take 1 tablet (5 mg total) by mouth at bedtime. 06/27/20  Yes Gerlene Fee, NP  metoprolol tartrate (LOPRESSOR) 25 MG tablet Take 0.5 tablets (12.5 mg total) by mouth 2 (two) times daily. 06/27/20  Yes Gerlene Fee, NP  Multiple Vitamin (MULTIVITAMIN ADULT PO) Take 1 tablet by mouth daily.   Yes [provider]  NON FORMULARY Diet Change: Regular with nectar thick liquids 02/26/20  Yes [provider]  NON FORMULARY Magic Cup qhs daily 05/10/20  Yes [provider]  simvastatin (ZOCOR) 20 MG tablet Take 1 tablet (20 mg total) by mouth daily. 06/27/20  Yes Gerlene Fee, NP  sitaGLIPtin (JANUVIA) 100 MG tablet Take 1 tablet (100 mg total) by mouth daily. 06/27/20  Yes Gerlene Fee, NP  acetaminophen (TYLENOL) 325 MG tablet Take 2 tablets (650 mg total) by mouth every 6 (six) hours as needed for mild pain (or Fever >/= 101). 02/14/20   Roxan Hockey, MD  Amino Acids-Protein Hydrolys (FEEDING SUPPLEMENT, PRO-STAT 64,) LIQD Take 30 mLs by mouth 2 (two) times daily between meals. To support wound healing and overall protein intake 09:00 AM, 05:00 PM 05/18/20   [provider]    Inpatient Medications: Scheduled Meds: . anastrozole  1 mg Oral Daily  . apixaban  2.5 mg Oral BID  . busPIRone  5 mg Oral BID  . Chlorhexidine Gluconate Cloth  6 each Topical Daily  .  donepezil  5 mg Oral QHS  . insulin aspart  0-5 Units Subcutaneous QHS  . insulin aspart  0-9 Units Subcutaneous TID WC  . metoprolol tartrate  5 mg Intravenous Once   Continuous Infusions: . cefTRIAXone (ROCEPHIN)  IV 1 g (07/01/20 1826)   PRN Meds: acetaminophen **OR** acetaminophen, ondansetron **OR** ondansetron (ZOFRAN) IV  Allergies:    Allergies  Allergen Reactions  . Codeine   . Zantac [Ranitidine Hcl]     Social History:   Social History   Socioeconomic History  . Marital status: Married    Spouse name: Not on file  . Number of children: Not on file  . Years of education: Not on file  . Highest education level: Not on file  Occupational History  . Occupation: retired   Tobacco Use  . Smoking status: Never Smoker  . Smokeless tobacco: Never Used  Vaping Use  .  Vaping Use: Never used  Substance and Sexual Activity  . Alcohol use: No  . Drug use: No  . Sexual activity: Not Currently  Other Topics Concern  . Not on file  Social History Narrative   Long term resident of [facility]    Social Determinants of Health   Financial Resource Strain:   . Difficulty of Paying Living Expenses: Not on file  Food Insecurity:   . Worried About Charity fundraiser in the Last Year: Not on file  . Ran Out of Food in the Last Year: Not on file  Transportation Needs:   . Lack of Transportation (Medical): Not on file  . Lack of Transportation (Non-Medical): Not on file  Physical Activity:   . Days of Exercise per Week: Not on file  . Minutes of Exercise per Session: Not on file  Stress:   . Feeling of Stress : Not on file  Social Connections:   . Frequency of Communication with Friends and Family: Not on file  . Frequency of Social Gatherings with Friends and Family: Not on file  . Attends Religious Services: Not on file  . Active Member of Clubs or Organizations: Not on file  . Attends Archivist Meetings: Not on file  . Marital Status: Not on file  Intimate  Partner Violence:   . Fear of Current or Ex-Partner: Not on file  . Emotionally Abused: Not on file  . Physically Abused: Not on file  . Sexually Abused: Not on file     Family History:    Family History  Family history unknown: Yes    Patient unable to contribute secondary to dementia.   Review of Systems    Unable to be obtained secondary to patient's dementia.   Physical Exam/Data    Vitals:   07/02/20 0722 07/02/20 0730 07/02/20 0800 07/02/20 0808  BP:   (!) 177/81   Pulse: (!) 107 (!) 102 (!) 145 (!) 118  Resp: (!) 26 14 (!) 22 16  Temp:      TempSrc:      SpO2: 91% 93% (!) 81% 95%  Weight:      Height:        Intake/Output Summary (Last 24 hours) at 07/02/2020 0855 Last data filed at 07/01/2020 1726 Gross per 24 hour  Intake 1756.77 ml  Output --  Net 1756.77 ml   Filed Weights   06/30/20 1430 07/01/20 0500  Weight: 58.7 kg 59.4 kg   Body mass index is 21.79 kg/m.   General: Pleasant, elderly female appearing in NAD Psych: Normal affect. Neuro: Alert and oriented X 1 (person). Moves all extremities spontaneously. HEENT: Normal  Neck: Supple without bruits or JVD. Lungs:  CTA with decreased breath sounds along bases bilaterally. Heart: Irregularly irregular. No s3, s4, or murmurs. Abdomen: Soft, non-tender, non-distended, BS + x 4.  Extremities: No clubbing or cyanosis. Trace lower extremity edema with compression stockings in place. DP/PT/Radials 2+ and equal bilaterally.   EKG:  The EKG was personally reviewed and demonstrates: Junctional bradycardia, heart rate 35 with incomplete LBBB    Labs/Studies     Relevant CV Studies:  Echocardiogram: 12/2019 IMPRESSIONS    1. Left ventricular ejection fraction, by estimation, is 50 to 55%. The  left ventricle has low normal function. The left ventricle has no regional  wall motion abnormalities. There is mild concentric left ventricular  hypertrophy. Left ventricular  diastolic function could  not be evaluated.  2. Right ventricular systolic function  is normal. The right ventricular  size is normal. There is normal pulmonary artery systolic pressure. The  estimated right ventricular systolic pressure is 29.4 mmHg.  3. Left atrial size was mildly dilated.  4. The mitral valve is normal in structure. Trivial mitral valve  regurgitation.  5. The aortic valve is normal in structure. Aortic valve regurgitation is  not visualized. No aortic stenosis is present.  6. The inferior vena cava is dilated in size with <50% respiratory  variability, suggesting right atrial pressure of 15 mmHg.   Laboratory Data:  Chemistry Recent Labs  Lab 06/30/20 1441 07/01/20 0542 07/02/20 0430  NA 133* 135 136  K 5.0 4.3 3.9  CL 95* 100 102  CO2 24 24 24   GLUCOSE 344* 182* 134*  BUN 45* 49* 57*  CREATININE 1.64* 1.87* 1.85*  CALCIUM 8.8* 8.2* 8.1*  GFRNONAA 29* 25* 25*  ANIONGAP 14 11 10     Recent Labs  Lab 06/30/20 1441  PROT 7.2  ALBUMIN 3.3*  AST 34  ALT 40  ALKPHOS 126  BILITOT 1.4*   Hematology Recent Labs  Lab 06/30/20 1441 07/01/20 0542 07/02/20 0430  WBC 19.1* 15.7* 9.3  RBC 3.75* 3.35* 3.27*  HGB 10.0* 8.9* 8.5*  HCT 33.2* 29.9* 29.0*  MCV 88.5 89.3 88.7  MCH 26.7 26.6 26.0  MCHC 30.1 29.8* 29.3*  RDW 15.8* 15.9* 15.8*  PLT 234 220 217   Cardiac EnzymesNo results for input(s): TROPONINI in the last 168 hours. No results for input(s): TROPIPOC in the last 168 hours.  BNP Recent Labs  Lab 06/30/20 1443  BNP 1,208.0*    DDimer No results for input(s): DDIMER in the last 168 hours.  Radiology/Studies:  DG Chest 2 View  Result Date: 06/30/2020 CLINICAL DATA:  Foul-smelling urine and abnormal EKG EXAM: CHEST - 2 VIEW COMPARISON:  02/13/2020 FINDINGS: Cardiac shadow is enlarged. Aortic calcifications are noted. Lungs are well aerated bilaterally. Chronic blunting of the right costophrenic angle is noted. No new focal infiltrate is seen. Mild central  vascular congestion is noted. IMPRESSION: Changes of mild CHF without edema. Chronic changes in the right base. Electronically Signed   By: Inez Catalina M.D.   On: 06/30/2020 15:32   CT HEAD WO CONTRAST  Result Date: 07/01/2020 CLINICAL DATA:  Altered mental status on top of baseline dementia. Chronic anticoagulation. EXAM: CT HEAD WITHOUT CONTRAST TECHNIQUE: Contiguous axial images were obtained from the base of the skull through the vertex without intravenous contrast. COMPARISON:  None. FINDINGS: Brain: Examination is technically limited by motion artifact. There is evidence of diffuse cerebral atrophy. Ventricular dilatation is likely due to central atrophy. Low-attenuation changes in the deep white matter likely represent small vessel ischemic change. No obvious mass effect or midline shift. No acute intracranial hemorrhage is demonstrated. No abnormal extra-axial fluid collections. Vascular: No acute vascular abnormality is identified. Skull: Visualized calvarium appears intact. Sinuses/Orbits: Visualized paranasal sinuses and mastoid air cells are clear. Other: None. IMPRESSION: 1. Technically limited study due to motion artifact. 2. No acute intracranial abnormalities are demonstrated. 3. Chronic atrophy and small vessel ischemic changes. Electronically Signed   By: Lucienne Capers M.D.   On: 07/01/2020 00:43     Assessment & Plan    1. 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  -  HR improved and was well-controlled but she went back into atrial fibrillation overnight while agitated. HR into the 140's at times but now improved into the 110's at rest but quickly goes into the 130's while talking. She is asymptomatic at this time. Will give IV Lopressor 5mg  x1 for  now and see how HR responds. Her last known episode of atrial fibrillation was in 12/2019 and she converted back with IV Amiodarone. Will review with Dr. Harl Bowie in regards to restarting Amiodarone as she will not be able to tolerate both Amio and Lopressor as an outpatient given her significant bradycardia on admission. Not an ideal PPM candidate given her baseline dementia.  - This patients CHA2DS2-VASc Score and unadjusted Ischemic Stroke Rate (% per year) is equal to 9.7 % stroke rate/year from a score of 6 (HTN, DM, Vascular, Female, Age (2)). Was on Eliquis 5mg  BID prior to admission but reduced to 2.5mg  on admission which is appropriate given creatinine > 1.5 and weight < 60 kg (at 59 kg on most recent check). She will be 79 yo on 07/21/2020 as well.   2. HTN - BP has been variable from 92/34 - 177/81 within the past 24 hours, mostly elevated when agitated overnight. PTA Amlodipine ad Lopressor currently held. Would follow BP with administration of IV Lopressor. If BP remains elevated, can restart Amlodipine.   3. UTI - Per admitting team. WBC improving as this as elevated to 19.1 on admission, at 9.3 today. Remains on IV Ceftriaxone.   4. AKI - Baseline creatinine 0.8 - 1.0. Was at 1.64 on admission, at 1.85 today. Would hold additional IVF given volume overload on examination. Will likely require IV diuresis (BNP 1208 on admission).    5. Dementia - A&Ox1 at the time of this encounter. Resides at the [facility] as an outpatient. Also on Aricept which could be contributing to her bradycardia as well.    For questions or updates, please contact [place] Please consult [email or web address] for contact info under Cardiology/STEMI.  Signed, Erma Heritage, PA-C 07/02/2020, 8:55 AM Pager: [phone]   Attending note Patient seen and disucssed with PA Ahmed Prima, I agree with her documentation. 79 yo female history of dementia, breast cancer, DM, aflutter presented with poor oral intake,  decreased urine output, fatigue, AMS. Noted bradycardia to 30s on presentation EKG showing junctional rhythm. Had been on amio 200mg  daily and lopressor 12.5mg  bid.    TSH 2.5 K 5 Cr 1.64 BUN 45 WBC 19.1 Plt 234 BNP 1208 Mg 2.5  hstrop 14-->14 COVID neg CXR mild CHF without edema EKG junctional 35 12/2019 echo LVE 50-55%   Presented with UTI and AMS, during evaluatoin found to be in junction rhythm in the 30s. On amio and lopressor at home, both held on admission. Some issues with afib with RVR this AM transient, now back in SR rates 60s. Certaintly with UTI has systemic stress to lead to elevated rates. Restart oral amio 200mg  daily, remain off lopressor and follow rates. Not a pacemaker candidate.  UTI being managed by primary team  Carlyle Dolly MD

## 2020-07-03 DIAGNOSIS — I495 Sick sinus syndrome: Secondary | ICD-10-CM | POA: Diagnosis not present

## 2020-07-03 DIAGNOSIS — R001 Bradycardia, unspecified: Secondary | ICD-10-CM | POA: Diagnosis not present

## 2020-07-03 LAB — CBC WITH DIFFERENTIAL/PLATELET
Abs Immature Granulocytes: 0.02 10*3/uL (ref 0.00–0.07)
Basophils Absolute: 0 10*3/uL (ref 0.0–0.1)
Basophils Relative: 0 %
Eosinophils Absolute: 0 10*3/uL (ref 0.0–0.5)
Eosinophils Relative: 0 %
HCT: 30.5 % — ABNORMAL LOW (ref 36.0–46.0)
Hemoglobin: 9.1 g/dL — ABNORMAL LOW (ref 12.0–15.0)
Immature Granulocytes: 0 %
Lymphocytes Relative: 8 %
Lymphs Abs: 0.7 10*3/uL (ref 0.7–4.0)
MCH: 26.5 pg (ref 26.0–34.0)
MCHC: 29.8 g/dL — ABNORMAL LOW (ref 30.0–36.0)
MCV: 88.7 fL (ref 80.0–100.0)
Monocytes Absolute: 0.6 10*3/uL (ref 0.1–1.0)
Monocytes Relative: 8 %
Neutro Abs: 6.9 10*3/uL (ref 1.7–7.7)
Neutrophils Relative %: 84 %
Platelets: 282 10*3/uL (ref 150–400)
RBC: 3.44 MIL/uL — ABNORMAL LOW (ref 3.87–5.11)
RDW: 15.7 % — ABNORMAL HIGH (ref 11.5–15.5)
WBC: 8.2 10*3/uL (ref 4.0–10.5)
nRBC: 0 % (ref 0.0–0.2)

## 2020-07-03 LAB — URINE CULTURE: Culture: >=100000 — AB

## 2020-07-03 LAB — GLUCOSE, CAPILLARY
Glucose-Capillary: 139 mg/dL — ABNORMAL HIGH (ref 70–99)
Glucose-Capillary: 191 mg/dL — ABNORMAL HIGH (ref 70–99)
Glucose-Capillary: 177 mg/dL — ABNORMAL HIGH (ref 70–99)
Glucose-Capillary: 173 mg/dL — ABNORMAL HIGH (ref 70–99)

## 2020-07-03 LAB — BASIC METABOLIC PANEL
Anion gap: 12 (ref 5–15)
BUN: 44 mg/dL — ABNORMAL HIGH (ref 8–23)
CO2: 24 mmol/L (ref 22–32)
Calcium: 8.5 mg/dL — ABNORMAL LOW (ref 8.9–10.3)
Chloride: 103 mmol/L (ref 98–111)
Creatinine, Ser: 1.22 mg/dL — ABNORMAL HIGH (ref 0.44–1.00)
GFR, Estimated: 42 mL/min — ABNORMAL LOW (ref >60)
Glucose, Bld: 151 mg/dL — ABNORMAL HIGH (ref 70–99)
Potassium: 4.1 mmol/L (ref 3.5–5.1)
Sodium: 139 mmol/L (ref 135–145)

## 2020-07-03 MED ORDER — APIXABAN 5 MG PO TABS
5.0000 mg | ORAL_TABLET | Freq: Two times a day (BID) | ORAL | Status: DC
  Administered 2020-07-03 – 2020-07-04 (×2): 5 mg via ORAL
  Filled 2020-07-03 (×2): qty 1

## 2020-07-03 MED ORDER — AMLODIPINE BESYLATE 5 MG PO TABS
5.0000 mg | ORAL_TABLET | Freq: Every day | ORAL | Status: DC
  Administered 2020-07-03 – 2020-07-04 (×2): 5 mg via ORAL
  Filled 2020-07-03 (×2): qty 1

## 2020-07-03 MED ORDER — SULFAMETHOXAZOLE-TRIMETHOPRIM 800-160 MG PO TABS
1.0000 | ORAL_TABLET | Freq: Two times a day (BID) | ORAL | Status: DC
  Administered 2020-07-03 – 2020-07-04 (×3): 1 via ORAL
  Filled 2020-07-03 (×3): qty 1

## 2020-07-03 NOTE — Progress Notes (Signed)
Pt constantly removes O2 and monitoring devices despite safety mitts and redirection.

## 2020-07-03 NOTE — Progress Notes (Signed)
PROGRESS NOTE    Amy Hoffman  IRJ:[id number] DOB: Jan 17, 1941 DOA: 06/30/2020 PCP: [name], NP  Brief Narrative:   79 year old white female DM TY 2, HTN, HLD, asthma, right-sided breast cancer, A. fib CHADS2 score >4 previously on anticoagulation, Recent admission 02/05/2020 02/14/2020 with decreased level of consciousness admitted with hypoxemia respiratory distress pneumonia/asthma/?  Diastolic dysfunction in the setting of also metabolic encephalopathy-at that admission was made DNR and discharged to nursing facility Patiently presented to [facility] 07/02/2020 with profound bradycardia in the setting of presumed UTI and cardiology was consulted she has had intermittent confusion and remains on oxygen    Assessment & Plan:   Principal Problem:   Bradycardia Active Problems:   Major depressive disorder, single episode, unspecified   Atrial fibrillation ([place])   Dementia with behavioral disturbance (HCC)   CHF (congestive heart failure) ([place])   Do not resuscitate status   Hypertensive heart disease with chronic diastolic congestive heart failure ([place])   Controlled type 2 diabetes mellitus without complication, without long-term current use of insulin (HCC)   AKI (acute kidney injury) ([place])   Chronic respiratory failure with hypoxia (HCC)   Acute lower UTI   1. Tachybradycardia syndrome with symptomatic bradycardia and intermittent A. fib RVR a. Patient had a run of flutter fib overnight and cardiology managing b. Currently continues on Eliquis reducing dose to 2.5 mg twice daily as per cardiology recommendations c. amio 200 qd resumed 10/19--given a dose of metoprolol IV 10-/19 for tachycardia as well d. Defer rest to cardiology e. Probably would d/c Aricept if further bradycardia 2. Klebsiella PNA Urinary tract infection from 10/17 a. Ceftriaxone changed to oral Bactrim DS to complete on 10/22 b. Unclear how urine was captured 3. AKI Baseline creatinine 1 a. Much improved from  admission b. Not on fluids 4. Metabolic encephalopathy with underlying dementia a. She is on Aricept which can cause bradycardia and also on several agents such as BuSpar Seroquel Haldol which can cause confusion and prolonged QTC b. Monitor trends c. She has required intermittent mittens secondary to agitation at night but I have asked nursing to try to keep him off d. Please avoid any opiate medications 5. Prior right-sided breast cancer a. Continue anastrozole 1 mg daily 6. Chronic hypoxic respiratory failure a. Usually on 2 L of oxygen which we will continue 7. Uncontrolled HTN a. Amlodipine 5 mg added to above medications for blood pressure control suspect blood pressure may be elevated secondary to agitation 8. DM TY 2 a. CBGs ranging from 1 39-1 53 using sliding scale b. On discharge can resume prior to admission Januvia 1 tablet 100 mg daily 9. DNR status  DVT prophylaxis: eliquis Code Status: DNR Family Communication: None at the bedside Disposition:   Status is: Inpatient  Remains inpatient appropriate because:Hemodynamically unstable, Persistent severe electrolyte disturbances and Altered mental status   Dispo: The patient is from: Home              Anticipated d/c is to: Home              Anticipated d/c date is: 1 day              Patient currently is not medically stable to d/c.       Consultants:   Cardiology  Procedures: None  Antimicrobials: Bactrim DS currently   Subjective: Awake alert can tell me the place but cannot tell me the year nor month Cannot tell me president In no pain Nursing  notes she is eating some breakfast on her own but continues to pull out liquids When I take her off the oxygen she desats to the low 90s  Objective: Vitals:   07/03/20 0300 07/03/20 0400 07/03/20 0500 07/03/20 0600  BP: (!) 124/43 (!) 195/58 110/67 138/65  Pulse: 62 77 91 88  Resp: 13 18 19  (!) 24  Temp:      TempSrc:      SpO2: 100% 99% (!) 89% 92%    Weight:      Height:        Intake/Output Summary (Last 24 hours) at 07/03/2020 0659 Last data filed at 07/03/2020 0100 Gross per 24 hour  Intake 555 ml  Output 450 ml  Net 105 ml   Filed Weights   06/30/20 1430 07/01/20 0500  Weight: 58.7 kg 59.4 kg    Examination:  General exam: EOMI NCAT.  Pleasant white female in no distress no icterus no pallor Respiratory system: CTA B no added sound no rales no rhonchi, upper respiratory breath sounds heard Cardiovascular system: S1-S2 seems to be in sinus rhythm Gastrointestinal system: Soft nontender no rebound no guarding. Central nervous system: ROM intact moving 4 limbs equally Extremities: She does have a heel protector on the left heel Skin: As above Psychiatry: Euthymic coherent  Data Reviewed: I have personally reviewed following labs and imaging studies BUNs/creatinine 49/1.87-->44/1.22 White count 19-->8.2 Hemoglobin 10.0->9.1 Urine culture 10/17 Klebsiella pneumonia still pending  Radiology Studies: No results found.   Scheduled Meds: . amiodarone  200 mg Oral Daily  . anastrozole  1 mg Oral Daily  . apixaban  2.5 mg Oral BID  . busPIRone  5 mg Oral BID  . Chlorhexidine Gluconate Cloth  6 each Topical Daily  . donepezil  5 mg Oral QHS  . insulin aspart  0-5 Units Subcutaneous QHS  . insulin aspart  0-9 Units Subcutaneous TID WC  . metoprolol tartrate  5 mg Intravenous Once  . QUEtiapine  12.5 mg Oral QHS   Continuous Infusions: . cefTRIAXone (ROCEPHIN)  IV Stopped (07/02/20 1815)     LOS: 3 days    Time spent: 40  Nita Sells, MD [facility] To contact the attending provider between 7A-7P or the covering provider during after hours 7P-7A, please log into the web site [email or web address] and access using universal [place] password for that web site. If you do not have the password, please call the hospital operator.  07/03/2020, 6:59 AM

## 2020-07-03 NOTE — Progress Notes (Addendum)
Progress Note  Patient Name: Amy Hoffman Date of Encounter: 07/03/2020  Primary Cardiologist: Carlyle Dolly, MD   Subjective   Mittens in place as she was pulling at her telemetry cords and removing her oxygen overnight. She denies any chest pain or palpitations this morning. Pleasantly confused.   Inpatient Medications    Scheduled Meds: . amiodarone  200 mg Oral Daily  . anastrozole  1 mg Oral Daily  . apixaban  2.5 mg Oral BID  . busPIRone  5 mg Oral BID  . Chlorhexidine Gluconate Cloth  6 each Topical Daily  . donepezil  5 mg Oral QHS  . insulin aspart  0-5 Units Subcutaneous QHS  . insulin aspart  0-9 Units Subcutaneous TID WC  . metoprolol tartrate  5 mg Intravenous Once  . QUEtiapine  12.5 mg Oral QHS   Continuous Infusions: . cefTRIAXone (ROCEPHIN)  IV Stopped (07/02/20 1815)   PRN Meds: acetaminophen **OR** acetaminophen, ondansetron **OR** ondansetron (ZOFRAN) IV   Vital Signs    Vitals:   07/03/20 0300 07/03/20 0400 07/03/20 0500 07/03/20 0600  BP: (!) 124/43 (!) 195/58 110/67 138/65  Pulse: 62 77 91 88  Resp: 13 18 19  (!) 24  Temp:  97.7 F (36.5 C)    TempSrc:  Oral    SpO2: 100% 99% (!) 89% 92%  Weight:    59.3 kg  Height:        Intake/Output Summary (Last 24 hours) at 07/03/2020 0758 Last data filed at 07/03/2020 0100 Gross per 24 hour  Intake 555 ml  Output 450 ml  Net 105 ml    Last 3 Weights 07/03/2020 07/01/2020 06/30/2020  Weight (lbs) 130 lb 11.7 oz 130 lb 15.3 oz 129 lb 6.4 oz  Weight (kg) 59.3 kg 59.4 kg 58.695 kg      Telemetry    Atrial fibrillation with RVR yesterday evening and 12 seconds of a wide complex tachycardia around 1700 (unclear if VT or atrial fibrillation with aberrancy). Converted back to NSR around 2200 with a 3.7 second post-termination pause. In NSR since. - Personally Reviewed  ECG    No new tracings.   Physical Exam   General: Elderly female appearing in no acute distress. Head:  Normocephalic, atraumatic.  Neck: Supple without bruits, JVD not elevated. Lungs:  Resp regular and unlabored, CTA without wheezing or rales. Heart: RRR, S1, S2, no S3, S4, or murmur; no rub. Abdomen: Soft, non-tender, non-distended with normoactive bowel sounds. No hepatomegaly. No rebound/guarding. No obvious abdominal masses. Extremities: No clubbing, cyanosis, or edema. Distal pedal pulses are 2+ bilaterally. Mittens in place. Neuro: Alert and oriented X 1 (person). Moves all extremities spontaneously. Psych: Normal affect.  Labs    Chemistry Recent Labs  Lab 06/30/20 1441 06/30/20 1441 07/01/20 0542 07/02/20 0430 07/03/20 0326  NA 133*   < > 135 136 139  K 5.0   < > 4.3 3.9 4.1  CL 95*   < > 100 102 103  CO2 24   < > 24 24 24   GLUCOSE 344*   < > 182* 134* 151*  BUN 45*   < > 49* 57* 44*  CREATININE 1.64*   < > 1.87* 1.85* 1.22*  CALCIUM 8.8*   < > 8.2* 8.1* 8.5*  PROT 7.2  --   --   --   --   ALBUMIN 3.3*  --   --   --   --   AST 34  --   --   --   --  ALT 40  --   --   --   --   ALKPHOS 126  --   --   --   --   BILITOT 1.4*  --   --   --   --   GFRNONAA 29*   < > 25* 25* 42*  ANIONGAP 14   < > 11 10 12    < > = values in this interval not displayed.     Hematology Recent Labs  Lab 07/01/20 0542 07/02/20 0430 07/03/20 0326  WBC 15.7* 9.3 8.2  RBC 3.35* 3.27* 3.44*  HGB 8.9* 8.5* 9.1*  HCT 29.9* 29.0* 30.5*  MCV 89.3 88.7 88.7  MCH 26.6 26.0 26.5  MCHC 29.8* 29.3* 29.8*  RDW 15.9* 15.8* 15.7*  PLT 220 217 282    BNP Recent Labs  Lab 06/30/20 1443  BNP 1,208.0*     Radiology    No results found.  Cardiac Studies   Echocardiogram: 12/2019 IMPRESSIONS   1. Left ventricular ejection fraction, by estimation, is 50 to 55%. The  left ventricle has low normal function. The left ventricle has no regional  wall motion abnormalities. There is mild concentric left ventricular  hypertrophy. Left ventricular  diastolic function could not be evaluated.   2. Right ventricular systolic function is normal. The right ventricular  size is normal. There is normal pulmonary artery systolic pressure. The  estimated right ventricular systolic pressure is 12.4 mmHg.  3. Left atrial size was mildly dilated.  4. The mitral valve is normal in structure. Trivial mitral valve  regurgitation.  5. The aortic valve is normal in structure. Aortic valve regurgitation is  not visualized. No aortic stenosis is present.  6. The inferior vena cava is dilated in size with <50% respiratory  variability, suggesting right atrial pressure of 15 mmHg.   Comparison(s): Prior images unable to be directly viewed, comparison made  by report only. No significant change from prior study.   Patient Profile     79 y.o. female w/ PMH of paroxysmal atrial flutter (diagnosed in 12/2019), HTN, Type 2 DM, asthma, dementia and history of breast cancer who is currently admitted for a UTI. Cardiology consulted for atrial fibrillation and bradycardia.    Assessment & Plan    1. 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  - She self-converted from atrial fibrillation to NSR during the morning hours of 10/19 and had recurrent atrial fibrillation that evening. Will review telemetry strips with Dr. Domenic Polite as she had a 12 second episode of a wide-complex tachycardia around 1700 and difficult to distinguish if more consistent with VT or atrial fibrillation with aberrancy. Converted back to NSR around 2200 with a 3.7 second post-termination pause. Not felt to be a PPM candidate given her advanced dementia. She has been restarted on PO Amiodarone 200mg  daily with plans to remain off Lopressor for now given her bradycardia on admission. - This patients  CHA2DS2-VASc Score and unadjusted Ischemic Stroke Rate (% per year) is equal to 9.7 % stroke rate/year from a score of 6 (HTN, DM, Vascular, Female, Age (2)). Remains on Eliquis for anticoagulation. Given improvement in creatinine, appropriate dose at this time would be 5mg  BID but she will turn 79 yo on 11/7 and dosing would need to be reduced to  2.5mg  BID given her weight and age.   2. HTN - Variable BP's recorded over the past 24 hours ranging from 110/67 - 195/58, SBP in the 160's on most recent check. Will plan to restart PTA Amlodipine 5mg  daily. Lopressor held given bradycardia.   3. UTI - WBC has normalized. Remains on IV Ceftriaxone.   4. AKI - Baseline creatinine 0.8 - 1.0. Peaked at 1.87 this admission, improved to 1.22 today. Continue to hold IVF at this time given elevated BNP.   5. Dementia - Resides at the [facility]. A&Ox1 at the time of this encounter. On Aricept as well which could be contributing to #1.     For questions or updates, please contact [place] Please consult [email or web address] for contact info under Cardiology/STEMI.   Arna Medici , PA-C 7:58 AM 07/03/2020 Pager: [phone]   Attending note:  Hospital course reviewed including recent consultation by Dr. Harl Bowie.  Case discussed with Ms. Ahmed Prima PA-C, I agree with her above findings.  Patient has had paroxysmal atrial fibrillation with RVR and intermittent bradycardia manifested as junctional rhythm, also posttermination pause per review of telemetry from yesterday.  Brief wide-complex tachycardia is likely aberrancy.  Patient has significant dementia at baseline.  Heart rate currently in the 70s to 90s in sinus rhythm, systolic blood pressure 248L to 170s.  Pertinent lab work includes potassium 4.1, creatinine 1.22, hemoglobin 9.1, platelets 282.  Current medications include oral amiodarone 200 mg daily, Eliquis which was reduced to 2.5 mg twice daily, she is no longer on oral  Lopressor.  Question utility of continuing Aricept at this time in light of advanced dementia, also the possibility that this could be contributing to intermittent symptomatic bradycardia.  Will hold for now.  Satira Sark, M.D., F.A.C.C.

## 2020-07-03 NOTE — Progress Notes (Signed)
Pt continues to remove O2 and devices

## 2020-07-04 LAB — COMPREHENSIVE METABOLIC PANEL
ALT: 36 U/L (ref 0–44)
AST: 22 U/L (ref 15–41)
Albumin: 2.8 g/dL — ABNORMAL LOW (ref 3.5–5.0)
Alkaline Phosphatase: 97 U/L (ref 38–126)
Anion gap: 11 (ref 5–15)
BUN: 44 mg/dL — ABNORMAL HIGH (ref 8–23)
CO2: 25 mmol/L (ref 22–32)
Calcium: 8.7 mg/dL — ABNORMAL LOW (ref 8.9–10.3)
Chloride: 103 mmol/L (ref 98–111)
Creatinine, Ser: 1.08 mg/dL — ABNORMAL HIGH (ref 0.44–1.00)
GFR, Estimated: 49 mL/min — ABNORMAL LOW (ref >60)
Glucose, Bld: 182 mg/dL — ABNORMAL HIGH (ref 70–99)
Potassium: 4.5 mmol/L (ref 3.5–5.1)
Sodium: 139 mmol/L (ref 135–145)
Total Bilirubin: 0.8 mg/dL (ref 0.3–1.2)
Total Protein: 6.3 g/dL — ABNORMAL LOW (ref 6.5–8.1)

## 2020-07-04 LAB — CBC WITH DIFFERENTIAL/PLATELET
Abs Immature Granulocytes: 0.02 10*3/uL (ref 0.00–0.07)
Basophils Absolute: 0 10*3/uL (ref 0.0–0.1)
Basophils Relative: 0 %
Eosinophils Absolute: 0 10*3/uL (ref 0.0–0.5)
Eosinophils Relative: 0 %
HCT: 30.3 % — ABNORMAL LOW (ref 36.0–46.0)
Hemoglobin: 8.9 g/dL — ABNORMAL LOW (ref 12.0–15.0)
Immature Granulocytes: 0 %
Lymphocytes Relative: 13 %
Lymphs Abs: 1 10*3/uL (ref 0.7–4.0)
MCH: 25.8 pg — ABNORMAL LOW (ref 26.0–34.0)
MCHC: 29.4 g/dL — ABNORMAL LOW (ref 30.0–36.0)
MCV: 87.8 fL (ref 80.0–100.0)
Monocytes Absolute: 0.7 10*3/uL (ref 0.1–1.0)
Monocytes Relative: 9 %
Neutro Abs: 5.8 10*3/uL (ref 1.7–7.7)
Neutrophils Relative %: 78 %
Platelets: 300 10*3/uL (ref 150–400)
RBC: 3.45 MIL/uL — ABNORMAL LOW (ref 3.87–5.11)
RDW: 15.8 % — ABNORMAL HIGH (ref 11.5–15.5)
WBC: 7.5 10*3/uL (ref 4.0–10.5)
nRBC: 0 % (ref 0.0–0.2)

## 2020-07-04 LAB — GLUCOSE, CAPILLARY
Glucose-Capillary: 164 mg/dL — ABNORMAL HIGH (ref 70–99)
Glucose-Capillary: 200 mg/dL — ABNORMAL HIGH (ref 70–99)

## 2020-07-04 LAB — MAGNESIUM: Magnesium: 2.5 mg/dL — ABNORMAL HIGH (ref 1.7–2.4)

## 2020-07-04 MED ORDER — APIXABAN 2.5 MG PO TABS: 2.5000 mg | ORAL_TABLET | Freq: Two times a day (BID) | ORAL | Status: AC

## 2020-07-04 MED ORDER — SULFAMETHOXAZOLE-TRIMETHOPRIM 800-160 MG PO TABS
1.0000 | ORAL_TABLET | Freq: Two times a day (BID) | ORAL | 0 refills | Status: DC
Start: 2020-07-04 — End: 2020-07-08

## 2020-07-04 NOTE — Progress Notes (Signed)
SATURATION QUALIFICATIONS: (This note is used to comply with regulatory documentation for home oxygen)  Patient Saturations on Room Air at Rest = 86%  Patient Saturations on Room Air while Ambulating = 84%  Patient Saturations on 2 Liters of oxygen while Ambulating = 86%  Please briefly explain why patient needs home oxygen: Patient requiring 2L of oxygen when at rest, patient has had sats as low as 86% on room air when sitting in bed.

## 2020-07-04 NOTE — Progress Notes (Signed)
Telemetry reviewed primarily SR. Occasional junctional bradycardia to 40s, very rare tachycardia. Overall no sustained brady or tachycarrhythmias. With her dementia poor pacemaker candidate, I think we would tolerate a certain degree of tachy brady at this time especially given no episodes are sustained. Continue oral amio at this time, we will continue to monitor telemetry. Remain off lopressor, aricept was stopped.     Carlyle Dolly MD

## 2020-07-04 NOTE — TOC Transition Note (Addendum)
Transition of Care [facility]) - CM/SW Discharge Note   Patient Details  Name: Amy Hoffman MRN: [id number] Date of Birth: 1941-03-28  [facility] of [facility]) CM/SW Contact:  Natasha Bence, LCSW Phone Number: 07/04/2020, 11:12 AM   Clinical Narrative:    CSW notified of patient's discharge. CSW notified Vaughan Basta with [facility] of discharge and resumption orders. Vaughan Basta agreeable to resume [facility] services. TOC signing off.  Addendum 12:38pm CSW received notification that patient did not have home O2 and would require it for discharge. CSW contacted Caryl Pina with [employer]. [place] agreeable to provide O2 to patient. TOC signing off.    Final next level of care: [place] Barriers to Discharge: Barriers Resolved   Patient Goals and CMS Choice Patient states their goals for this hospitalization and ongoing recovery are:: return home with home health   Choice offered to / list presented to : Spouse  Discharge Placement                    Patient and family notified of of transfer: 07/04/20  Discharge Plan and Services In-house Referral: Clinical Social Work   Post Acute Care Choice: Resumption of Svcs/PTA Provider                    HH Arranged: PT, Nurse's Aide [place] Agency: [place] ([place]) Date HH Agency Contacted: 07/04/20 Time [place]: 1111 Representative spoke with at [place]: [place] ([place]) Interventions     Readmission Risk Interventions Readmission Risk Prevention Plan 07/01/2020 02/13/2020 02/06/2020  Transportation Screening Complete Complete Complete  Medication Review Press photographer) Complete Complete Complete  PCP or Specialist appointment within 3-5 days of discharge - Not Complete Not Complete  HRI or Home Care Consult Complete Not Complete -  SW Recovery Care/Counseling Consult Complete Complete -  Palliative Care Screening Not Applicable Complete -  [place] Not  Applicable Complete -  Some recent data might be hidden

## 2020-07-04 NOTE — Care Management Important Message (Signed)
Important Message  Patient Details  Name: Amy Hoffman MRN: [id number] Date of Birth: 1941-06-21   Medicare Important Message Given:  Yes (mailed to [address] [place], [place] [address])     Tommy Medal 07/04/2020, 5:06 PM

## 2020-07-04 NOTE — Discharge Summary (Signed)
Physician Discharge Summary  Amy Hoffman ZTI:[id number] DOB: 29-Jul-1941 DOA: 06/30/2020  PCP: Amy Ards, NP  Admit date: 06/30/2020 Discharge date: 07/04/2020  Time spent: 37 minutes  Recommendations for Outpatient Follow-up:  1. Needs Chem-12, magnesium, CBC in about 1 week at outpatient physician office 2. Recommend outpatient coordination with cardiology regarding further management of tachybradycardia syndrome although patient is poor pacemaker candidate 3. Complete Bactrim DS 10/23 making it 7 days of treatment 4. Continue home O2 on discharge  Discharge Diagnoses:  Principal Problem:   Bradycardia Active Problems:   Major depressive disorder, single episode, unspecified   Atrial fibrillation (HCC)   Dementia with behavioral disturbance (HCC)   CHF (congestive heart failure) (HCC)   Do not resuscitate status   Hypertensive heart disease with chronic diastolic congestive heart failure (HCC)   Controlled type 2 diabetes mellitus without complication, without long-term current use of insulin (HCC)   AKI (acute kidney injury) ([place])   Chronic respiratory failure with hypoxia ([place])   Acute lower UTI   Tachycardia-bradycardia syndrome ([place])   Discharge Condition: Fair  Diet recommendation: Heart healthy  Filed Weights   06/30/20 1430 07/01/20 0500 07/03/20 0600  Weight: 58.7 kg 59.4 kg 59.3 kg    History of present illness:  79 year old white female DM TY 2, HTN, HLD, asthma, right-sided breast cancer, A. fib CHADS2 score >4 previously on anticoagulation, Recent admission 02/05/2020 02/14/2020 with decreased level of consciousness admitted with hypoxemia respiratory distress pneumonia/asthma/?  Diastolic dysfunction in the setting of also metabolic encephalopathy-at that admission was made DNR and discharged to nursing facility Patiently presented to [facility] 07/02/2020 with profound bradycardia in the setting of presumed UTI and cardiology was consulted she has had  intermittent confusion and remains on oxygen    Hospital Course:  1. Tachybradycardia syndrome with symptomatic bradycardia and intermittent A. fib RVR a. Patient had a run of flutter fib overnight and cardiology managing b. Discharging on reduced dose Eliquis 2.5 twice daily c. amio 200 qd resumed 10/19--Aricept discontinued secondary to bradycardia 2. Klebsiella PNA Urinary tract infection from 10/17 a. Ceftriaxone changed to oral Bactrim DS to complete on 10/23 b. Unclear how urine was captured 3. AKI Baseline creatinine 1 a. Much improved from admission b. Not on fluids 4. Metabolic encephalopathy with underlying dementia a. She is on Aricept which can cause bradycardia and also on several agents such as BuSpar Seroquel Haldol which can cause confusion and prolonged QTC b. Monitor trends c. She has required intermittent mittens secondary to agitation at night but I have asked nursin agitation resolved overnight we will get home health come out and manage d. Please avoid any opiate medications 5. Prior right-sided breast cancer a. Continue anastrozole 1 mg daily 6. Chronic hypoxic respiratory failure a. Usually on 2 L of oxygen which we will continue 7. Uncontrolled HTN a. Amlodipine 5 mg added to above medications for blood pressure control suspect blood pressure may be elevated secondary to agitation 8. DM TY 2 a. CBGs ranging from moderate controlled on sliding scale b. On discharge can resume prior to admission Januvia 1 tablet 100 mg daily 9. DNR status   Consultations:  Cardiologist  Discharge Exam: Vitals:   07/04/20 0900 07/04/20 1000  BP: (!) 170/71 (!) 171/53  Pulse: 66 (!) 58  Resp: (!) 25 19  Temp:    SpO2: 100% 97%    General: Awake alert coherent no distress little bit confused at times Cardiovascular: S1-S2 slight tachycardia and A. fib ranging from  90-110 Respiratory: Clear no added sound no rales no rhonchi Abdomen soft no rebound no  guarding Neurologically intact moving 4 limbs equally without deficit  Discharge Instructions   Discharge Instructions    Diet - low sodium heart healthy   Complete by: As directed    Discharge instructions   Complete by: As directed    Discharge with lower doses of some medication for heart rate control-please note that metoprolol has been discontinued and that heart rate control is difficult secondary to the fact that patient is hypotensive at times She will need outpatient follow-up with cardiologist and labs in about 1 week at primary care physician office in addition to discussion with either heart failure team and or electrophysiologist to be arranged by cardiology Recommend home health on discharge who will come out and monitor the patient in addition   Increase activity slowly   Complete by: As directed      Allergies as of 07/04/2020      Reactions   Codeine    Zantac [ranitidine Hcl]       Medication List    STOP taking these medications   donepezil 5 MG tablet Commonly known as: ARICEPT   metoprolol tartrate 25 MG tablet Commonly known as: LOPRESSOR     TAKE these medications   acetaminophen 325 MG tablet Commonly known as: TYLENOL Take 2 tablets (650 mg total) by mouth every 6 (six) hours as needed for mild pain (or Fever >/= 101).   amiodarone 200 MG tablet Commonly known as: PACERONE Take 1 tablet (200 mg total) by mouth daily.   amLODipine 5 MG tablet Commonly known as: NORVASC Take 1 tablet (5 mg total) by mouth daily.   anastrozole 1 MG tablet Commonly known as: ARIMIDEX Take 1 tablet (1 mg total) by mouth daily.   apixaban 5 MG Tabs tablet Commonly known as: Eliquis Take 1 tablet (5 mg total) by mouth 2 (two) times daily.   busPIRone 5 MG tablet Commonly known as: BUSPAR Take 1 tablet (5 mg total) by mouth 2 (two) times daily.   feeding supplement (PRO-STAT 64) Liqd Take 30 mLs by mouth 2 (two) times daily between meals. To support wound  healing and overall protein intake 09:00 AM, 05:00 PM   MULTIVITAMIN ADULT PO Take 1 tablet by mouth daily.   NON FORMULARY Diet Change: Regular with nectar thick liquids   NON FORMULARY Magic Cup qhs daily   simvastatin 20 MG tablet Commonly known as: ZOCOR Take 1 tablet (20 mg total) by mouth daily.   sitaGLIPtin 100 MG tablet Commonly known as: JANUVIA Take 1 tablet (100 mg total) by mouth daily.   sulfamethoxazole-trimethoprim 800-160 MG tablet Commonly known as: BACTRIM DS Take 1 tablet by mouth every 12 (twelve) hours.   Venelex Oint Special Instructions: Apply to sacrum, bilateral buttocks and coccyx qshift for prevention. Every Shift Day, Evening, Night      Allergies  Allergen Reactions  . Codeine   . Zantac [Ranitidine Hcl]       The results of significant diagnostics from this hospitalization (including imaging, microbiology, ancillary and laboratory) are listed below for reference.    Significant Diagnostic Studies: DG Chest 2 View  Result Date: 06/30/2020 CLINICAL DATA:  Foul-smelling urine and abnormal EKG EXAM: CHEST - 2 VIEW COMPARISON:  02/13/2020 FINDINGS: Cardiac shadow is enlarged. Aortic calcifications are noted. Lungs are well aerated bilaterally. Chronic blunting of the right costophrenic angle is noted. No new focal infiltrate is seen. Mild central  vascular congestion is noted. IMPRESSION: Changes of mild CHF without edema. Chronic changes in the right base. Electronically Signed   By: Inez Catalina M.D.   On: 06/30/2020 15:32   CT HEAD WO CONTRAST  Result Date: 07/01/2020 CLINICAL DATA:  Altered mental status on top of baseline dementia. Chronic anticoagulation. EXAM: CT HEAD WITHOUT CONTRAST TECHNIQUE: Contiguous axial images were obtained from the base of the skull through the vertex without intravenous contrast. COMPARISON:  None. FINDINGS: Brain: Examination is technically limited by motion artifact. There is evidence of diffuse cerebral  atrophy. Ventricular dilatation is likely due to central atrophy. Low-attenuation changes in the deep white matter likely represent small vessel ischemic change. No obvious mass effect or midline shift. No acute intracranial hemorrhage is demonstrated. No abnormal extra-axial fluid collections. Vascular: No acute vascular abnormality is identified. Skull: Visualized calvarium appears intact. Sinuses/Orbits: Visualized paranasal sinuses and mastoid air cells are clear. Other: None. IMPRESSION: 1. Technically limited study due to motion artifact. 2. No acute intracranial abnormalities are demonstrated. 3. Chronic atrophy and small vessel ischemic changes. Electronically Signed   By: Lucienne Capers M.D.   On: 07/01/2020 00:43    Microbiology: Recent Results (from the past 240 hour(s))  Culture, Urine     Status: Abnormal   Collection Time: 06/30/20  2:42 PM   Specimen: Urine, Clean Catch  Result Value Ref Range Status   Specimen Description   Final    URINE, CLEAN CATCH Performed at [facility], [address] [place], [place] [address]    Special Requests   Final    NONE Performed at [facility], [address] [place], [place] [address]    Culture >=100,000 COLONIES/mL KLEBSIELLA PNEUMONIAE (A)  Final   Report Status 07/03/2020 FINAL  Final   Organism ID, Bacteria KLEBSIELLA PNEUMONIAE (A)  Final      Susceptibility   Klebsiella pneumoniae - MIC*    AMPICILLIN RESISTANT Resistant     CEFAZOLIN <=4 SENSITIVE Sensitive     CEFTRIAXONE <=0.25 SENSITIVE Sensitive     CIPROFLOXACIN <=0.25 SENSITIVE Sensitive     GENTAMICIN <=1 SENSITIVE Sensitive     IMIPENEM <=0.25 SENSITIVE Sensitive     NITROFURANTOIN 64 INTERMEDIATE Intermediate     TRIMETH/SULFA <=20 SENSITIVE Sensitive     AMPICILLIN/SULBACTAM 4 SENSITIVE Sensitive     PIP/TAZO <=4 SENSITIVE Sensitive     * >=100,000 COLONIES/mL KLEBSIELLA PNEUMONIAE  Respiratory Panel by RT PCR (Flu A&B, Covid) - Nasopharyngeal Swab     Status:  None   Collection Time: 06/30/20  3:20 PM   Specimen: Nasopharyngeal Swab  Result Value Ref Range Status   SARS Coronavirus 2 by RT PCR NEGATIVE NEGATIVE Final    Comment: (NOTE) SARS-CoV-2 target nucleic acids are NOT DETECTED.  The SARS-CoV-2 RNA is generally detectable in upper respiratoy specimens during the acute phase of infection. The lowest concentration of SARS-CoV-2 viral copies this assay can detect is 131 copies/mL. A negative result does not preclude SARS-Cov-2 infection and should not be used as the sole basis for treatment or other patient management decisions. A negative result may occur with  improper specimen collection/handling, submission of specimen other than nasopharyngeal swab, presence of viral mutation(s) within the areas targeted by this assay, and inadequate number of viral copies (<131 copies/mL). A negative result must be combined with clinical observations, patient history, and epidemiological information. The expected result is Negative.  Fact Sheet for Patients:  PinkCheek.be  Fact Sheet for Healthcare Providers:  GravelBags.it  This test is no t yet approved or cleared by the [place] and  has been authorized for detection and/or diagnosis of SARS-CoV-2 by FDA under an Emergency Use Authorization (EUA). This EUA will remain  in effect (meaning this test can be used) for the duration of the COVID-19 declaration under Section 564(b)(1) of the Act, 21 U.S.C. section 360bbb-3(b)(1), unless the authorization is terminated or revoked sooner.     Influenza A by PCR NEGATIVE NEGATIVE Final   Influenza B by PCR NEGATIVE NEGATIVE Final    Comment: (NOTE) The Xpert Xpress SARS-CoV-2/FLU/RSV assay is intended as an aid in  the diagnosis of influenza from Nasopharyngeal swab specimens and  should not be used as a sole basis for treatment. Nasal washings and  aspirates are unacceptable for  Xpert Xpress SARS-CoV-2/FLU/RSV  testing.  Fact Sheet for Patients: PinkCheek.be  Fact Sheet for Healthcare Providers: GravelBags.it  This test is not yet approved or cleared by the [place] FDA and  has been authorized for detection and/or diagnosis of SARS-CoV-2 by  FDA under an Emergency Use Authorization (EUA). This EUA will remain  in effect (meaning this test can be used) for the duration of the  Covid-19 declaration under Section 564(b)(1) of the Act, 21  U.S.C. section 360bbb-3(b)(1), unless the authorization is  terminated or revoked. Performed at [facility], [address] [place], [place] [address]   MRSA PCR Screening     Status: None   Collection Time: 07/01/20  5:20 AM   Specimen: Nasal Mucosa; Nasopharyngeal  Result Value Ref Range Status   MRSA by PCR NEGATIVE NEGATIVE Final    Comment:        The GeneXpert MRSA Assay (FDA approved for NASAL specimens only), is one component of a comprehensive MRSA colonization surveillance program. It is not intended to diagnose MRSA infection nor to guide or monitor treatment for MRSA infections. Performed at [facility], [address], [place], [place] [address]   C Difficile Quick Screen w PCR reflex     Status: None   Collection Time: 07/01/20  6:00 PM   Specimen: STOOL  Result Value Ref Range Status   C Diff antigen NEGATIVE NEGATIVE Final   C Diff toxin NEGATIVE NEGATIVE Final   C Diff interpretation No C. difficile detected.  Final    Comment: Performed at [facility], [address], [place], [place] [address]     Labs: Basic Metabolic Panel: Recent Labs  Lab 06/30/20 1441 06/30/20 1636 07/01/20 0542 07/02/20 0430 07/03/20 0326 07/04/20 0439  NA 133*  --  135 136 139 139  K 5.0  --  4.3 3.9 4.1 4.5  CL 95*  --  100 102 103 103  CO2 24  --  24 24 24 25   GLUCOSE 344*  --  182* 134* 151* 182*  BUN 45*  --  49* 57* 44* 44*  CREATININE  1.64*  --  1.87* 1.85* 1.22* 1.08*  CALCIUM 8.8*  --  8.2* 8.1* 8.5* 8.7*  MG  --  2.5*  --   --   --  2.5*   Liver Function Tests: Recent Labs  Lab 06/30/20 1441 07/04/20 0439  AST 34 22  ALT 40 36  ALKPHOS 126 97  BILITOT 1.4* 0.8  PROT 7.2 6.3*  ALBUMIN 3.3* 2.8*   No results for input(s): LIPASE, AMYLASE in the last 168 hours. No results for input(s): AMMONIA in the last 168 hours. CBC: Recent Labs  Lab 06/30/20  1441 07/01/20 0542 07/02/20 0430 07/03/20 0326 07/04/20 0439  WBC 19.1* 15.7* 9.3 8.2 7.5  NEUTROABS 16.8*  --  7.8* 6.9 5.8  HGB 10.0* 8.9* 8.5* 9.1* 8.9*  HCT 33.2* 29.9* 29.0* 30.5* 30.3*  MCV 88.5 89.3 88.7 88.7 87.8  PLT 234 220 217 282 300   Cardiac Enzymes: No results for input(s): CKTOTAL, CKMB, CKMBINDEX, TROPONINI in the last 168 hours. BNP: BNP (last 3 results) Recent Labs    12/16/19 1238 02/06/20 0533 06/30/20 1443  BNP 196.0* 172.0* 1,208.0*    ProBNP (last 3 results) No results for input(s): PROBNP in the last 8760 hours.  CBG: Recent Labs  Lab 07/03/20 0803 07/03/20 1110 07/03/20 1605 07/03/20 2310 07/04/20 0804  GLUCAP 139* 191* 177* 173* 164*       Signed:  Nita Sells MD   [facility] 07/04/2020, 10:42 AM

## 2020-07-05 ENCOUNTER — Telehealth (INDEPENDENT_AMBULATORY_CARE_PROVIDER_SITE_OTHER): Payer: Self-pay | Admitting: Nurse Practitioner

## 2020-07-05 NOTE — Telephone Encounter (Signed)
Please find out if this patient is still living in a SNF, and if they are taking responsibility for her medical care via primary care providers at the SNF. She was discharged from the hospital recently and if the SNF is not taking over her medical care then she will need a follow-up appointment by the end of next week (07/08/20-07/11/20) with either myself or Dr. Anastasio Champion.

## 2020-07-08 ENCOUNTER — Other Ambulatory Visit: Payer: Self-pay

## 2020-07-08 ENCOUNTER — Encounter: Payer: Self-pay | Admitting: Cardiology

## 2020-07-08 ENCOUNTER — Telehealth: Payer: Self-pay | Admitting: Cardiology

## 2020-07-08 ENCOUNTER — Emergency Department (HOSPITAL_COMMUNITY)
Admission: EM | Admit: 2020-07-08 | Discharge: 2020-07-08 | Disposition: A | Discharge status: Home / Self Care | Payer: Medicare Other

## 2020-07-08 ENCOUNTER — Ambulatory Visit (INDEPENDENT_AMBULATORY_CARE_PROVIDER_SITE_OTHER): Payer: Medicare Other | Admitting: Cardiology

## 2020-07-08 VITALS — BP 136/58 | HR 68 | Ht 65.0 in | Wt 134.6 lb

## 2020-07-08 DIAGNOSIS — I4891 Unspecified atrial fibrillation: Secondary | ICD-10-CM | POA: Diagnosis not present

## 2020-07-08 DIAGNOSIS — I10 Essential (primary) hypertension: Secondary | ICD-10-CM | POA: Diagnosis not present

## 2020-07-08 DIAGNOSIS — Z7901 Long term (current) use of anticoagulants: Secondary | ICD-10-CM

## 2020-07-08 DIAGNOSIS — R001 Bradycardia, unspecified: Secondary | ICD-10-CM

## 2020-07-08 DIAGNOSIS — M7989 Other specified soft tissue disorders: Secondary | ICD-10-CM

## 2020-07-08 DIAGNOSIS — I4892 Unspecified atrial flutter: Secondary | ICD-10-CM | POA: Diagnosis not present

## 2020-07-08 DIAGNOSIS — F0391 Unspecified dementia with behavioral disturbance: Secondary | ICD-10-CM

## 2020-07-08 NOTE — Assessment & Plan Note (Signed)
Symptomatic junctional bradycardia 06/30/2020-  Better today- off beta blocker, she remains in NSR on exam- on Amiodarone.

## 2020-07-08 NOTE — Assessment & Plan Note (Signed)
Diagnosed April 2021- in NSR on exam today- HR 60's

## 2020-07-08 NOTE — Assessment & Plan Note (Signed)
Controlled- on Amlodipine 5 mg- possibly contributing to her LE edema.

## 2020-07-08 NOTE — Telephone Encounter (Signed)
Called patient and LMOVM to return call  Left a detailed voice message on daughters voice mail in regards to the message from Judson Roch and left our number to call us back to inform us.

## 2020-07-08 NOTE — Assessment & Plan Note (Signed)
On low dose Eliquis- wgt 60kg, she will be 79 y/o next week.

## 2020-07-08 NOTE — Progress Notes (Signed)
Cardiology Office Note:    Date:  07/08/2020   ID:  Amy Hoffman, DOB 04/24/1941, MRN [id number]  PCP:  [name], NP  Cardiologist:  Carlyle Dolly, MD  Electrophysiologist:  None   Referring MD: [name], NP   CC: pt "feels terrible" per care giver.  Not eating, drinking.   History of Present Illness:    Amy Hoffman is a 79 y.o. female with a hx of dementia and PAF.  In April 2021 she was diagnosed with AF and was placed on Eliquis, Amiodarone, and beta blocker.  Echo 12/2019 LVEF 50 to 55%.    She was seen in the ED 06/30/2020 after her care giver took the patient to urgent care for weakness.  She was sent to [facility] where an EKG showed bradycardia with heart rate of 35.  Her medications were adjusted- she was continued on Amiodarone but beta blocker was stopped. She was also diagnosed with a UTI and sent home on ABs.   She is seen in the office today accompanied by her care giver.  The patient appears in no distress.  The patient 's care giver reports the patient has been doing "terrible" since discharge.  She feels her UTI is incompletely treated. She reports the patient is not eating or drinking much, has a high BS and generalized weakness. The patient also has LE edema Rt > Lt.  The patient's care giver indicated she felt the patient was discharged "too soon-I didn't want her to be released".   Past Medical History:  Diagnosis Date  . Abnormal uterine and vaginal bleeding, unspecified   . Allergic rhinitis due to pollen   . Anxiety   . Asthma   . Cancer [facility])    breast cancer - right  . Dementia ([place])   . Depression   . Diabetes mellitus without complication ([place])   . Essential (primary) hypertension   . Hyperlipidemia, unspecified   . Hypertension   . Major depressive disorder, single episode, unspecified   . Obesity, unspecified   . Pain in joint involving shoulder region   . Reflux esophagitis   . Trigger finger, acquired   . Type 2 diabetes mellitus  with hyperglycemia ([place])   . Unspecified asthma with (acute) exacerbation   . 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 ([place])   . Unspecified osteoarthritis, unspecified site     Past Surgical History:  Procedure Laterality Date  . CHOLECYSTECTOMY    . MASTECTOMY Right   . RE-EXCISION OF BREAST LUMPECTOMY      Current Medications: Current Meds  Medication Sig  . acetaminophen (TYLENOL) 325 MG tablet Take 2 tablets (650 mg total) by mouth every 6 (six) hours as needed for mild pain (or Fever >/= 101).  Marland Kitchen amiodarone (PACERONE) 200 MG tablet Take 1 tablet (200 mg total) by mouth daily.  Marland Kitchen amLODipine (NORVASC) 5 MG tablet Take 1 tablet (5 mg total) by mouth daily.  Marland Kitchen anastrozole (ARIMIDEX) 1 MG tablet Take 1 tablet (1 mg total) by mouth daily.  Marland Kitchen apixaban (ELIQUIS) 2.5 MG TABS tablet Take 1 tablet (2.5 mg total) by mouth 2 (two) times daily.  Roseanne Kaufman [place]-Castor Oil [facility]) OINT Special Instructions: Apply to sacrum, bilateral buttocks and coccyx qshift for prevention. Every Shift Day, Evening, Night  . busPIRone (BUSPAR) 5 MG tablet Take 1 tablet (5 mg total) by mouth 2 (two) times daily.  . Multiple Vitamin (MULTIVITAMIN ADULT PO) Take 1 tablet by mouth daily.  Marland Kitchen NON  FORMULARY Diet Change: Regular with nectar thick liquids  . NON FORMULARY Magic Cup qhs daily  . simvastatin (ZOCOR) 20 MG tablet Take 1 tablet (20 mg total) by mouth daily.  . sitaGLIPtin (JANUVIA) 100 MG tablet Take 1 tablet (100 mg total) by mouth daily.     Allergies:   Codeine and Zantac [ranitidine hcl]   Social History   Socioeconomic History  . Marital status: Married    Spouse name: Not on file  . Number of children: Not on file  . Years of education: Not on file  . Highest education level: Not on file  Occupational History  . Occupation: retired   Tobacco Use  . Smoking status: Never Smoker  . Smokeless tobacco: Never Used  Vaping Use  . Vaping Use: Never used  Substance and Sexual  Activity  . Alcohol use: No  . Drug use: No  . Sexual activity: Not Currently  Other Topics Concern  . Not on file  Social History Narrative   Long term resident of [facility]    Social Determinants of Health   Financial Resource Strain:   . Difficulty of Paying Living Expenses: Not on file  Food Insecurity:   . Worried About Charity fundraiser in the Last Year: Not on file  . Ran Out of Food in the Last Year: Not on file  Transportation Needs:   . Lack of Transportation (Medical): Not on file  . Lack of Transportation (Non-Medical): Not on file  Physical Activity:   . Days of Exercise per Week: Not on file  . Minutes of Exercise per Session: Not on file  Stress:   . Feeling of Stress : Not on file  Social Connections:   . Frequency of Communication with Friends and Family: Not on file  . Frequency of Social Gatherings with Friends and Family: Not on file  . Attends Religious Services: Not on file  . Active Member of Clubs or Organizations: Not on file  . Attends Archivist Meetings: Not on file  . Marital Status: Not on file     Family History: The patient's Family history is unknown by patient.  ROS:   Please see the history of present illness.     All other systems reviewed and are negative.  EKGs/Labs/Other Studies Reviewed:    The following studies were reviewed today: Echo 12/17/2019- IMPRESSIONS    1. Left ventricular ejection fraction, by estimation, is 50 to 55%. The  left ventricle has low normal function. The left ventricle has no regional  wall motion abnormalities. There is mild concentric left ventricular  hypertrophy. Left ventricular  diastolic function could not be evaluated.  2. Right ventricular systolic function is normal. The right ventricular  size is normal. There is normal pulmonary artery systolic pressure. The  estimated right ventricular systolic pressure is 17.6 mmHg.  3. Left atrial size was mildly dilated.  4. The mitral  valve is normal in structure. Trivial mitral valve  regurgitation.  5. The aortic valve is normal in structure. Aortic valve regurgitation is  not visualized. No aortic stenosis is present.  6. The inferior vena cava is dilated in size with <50% respiratory  variability, suggesting right atrial pressure of 15 mmHg.  EKG:  EKG is not ordered today.  The ekg ordered 06/30/2020 demonstrates junctional rhythm, incomplete LBBB, HR 35  Recent Labs: 06/30/2020: B Natriuretic Peptide 1,208.0; TSH 2.558 07/04/2020: ALT 36; BUN 44; Creatinine, Ser 1.08; Hemoglobin 8.9; Magnesium 2.5; Platelets 300;  Potassium 4.5; Sodium 139  Recent Lipid Panel    Component Value Date/Time   CHOL 152 04/03/2020 1532   TRIG 71 04/03/2020 1532   HDL 70 04/03/2020 1532   CHOLHDL 2.2 04/03/2020 1532   VLDL 8 03/01/2020 0700   LDLCALC 67 04/03/2020 1532    Physical Exam:    VS:  BP (!) 136/58   Pulse 68   Ht 5\' 5"  (1.651 m)   Wt 134 lb 9.6 oz (61.1 kg)   SpO2 (!) 88%   BMI 22.40 kg/m     Wt Readings from Last 3 Encounters:  07/08/20 134 lb 9.6 oz (61.1 kg)  07/03/20 130 lb 11.7 oz (59.3 kg)  06/27/20 129 lb 6.4 oz (58.7 kg)     GEN: Chronically ill appearing elderly caucasian female, in wheelchair, in no acute distress HEENT: Normal NECK: No JVD;  CARDIAC: RRR, no murmurs, rubs, gallops RESPIRATORY:  Clear to auscultation without rales, wheezing or rhonchi  ABDOMEN: Soft, non-distended MUSCULOSKELETAL:  1+ RLE edema, trace LLE edema No deformity  SKIN: Warm and dry NEUROLOGIC:  Alert- able to answer yes or no questions but unable to give any history PSYCHIATRIC:  Normal affect   ASSESSMENT:    Bradycardia Symptomatic junctional bradycardia 06/30/2020-  Better today- off beta blocker, she remains in NSR on exam- on Amiodarone.  Anticoagulated On low dose Eliquis- wgt 60kg, she will be 79 y/o next week.   Dementia with behavioral disturbance [facility]) Advanced dementia-  Atrial fibrillation  [facility]) Diagnosed April 2021- in NSR on exam today- HR 60's   Essential hypertension Controlled- on Amlodipine 5 mg- possibly contributing to her LE edema.   PLAN:    Difficult situation.  The patient's caregiver is almost distraught.  She feels the patient is doing terribly.  The patient appears to be in no distress.  She does have some lower extremity edema.  Her most recent labs show she may in fact be dehydrated with a BUN in the 40s.  I am hesitant to add a diuretic at this point.  DVT seems unlikely as she is anticoagulated.  I suggested we obtain labs today including a CBC and BM P.  If her labs do indicate that the patient is actually volume depleted then we could consider changing her amlodipine to another agent for hypertension. If she is significantly anemic we can refer her to her PCP.    Medication Adjustments/Labs and Tests Ordered: Current medicines are reviewed at length with the patient today.  Concerns regarding medicines are outlined above.  Orders Placed This Encounter  Procedures  . Basic metabolic panel  . CBC  . TSH   No orders of the defined types were placed in this encounter.   Patient Instructions  Medication Instructions:  Your physician recommends that you continue on your current medications as directed. Please refer to the Current Medication list given to you today.  *If you need a refill on your cardiac medications before your next appointment, please call your pharmacy*  Lab Work: You will have labs drawn today: TSH, CBC, BMET  If you have labs (blood work) drawn today and your tests are completely normal, you will receive your results only by: Marland Kitchen MyChart Message (if you have MyChart) OR . A paper copy in the mail If you have any lab test that is abnormal or we need to change your treatment, we will call you to review the results.  Testing/Procedures: None ordered today  Follow-Up: At [facility], you  and your health needs are our priority.  As  part of our continuing mission to provide you with exceptional heart care, we have created designated Provider Care Teams.  These Care Teams include your primary Cardiologist (physician) and Advanced Practice Providers (APPs -  Physician Assistants and Nurse Practitioners) who all work together to provide you with the care you need, when you need it.  Your next appointment:   4-6 week(s)  The format for your next appointment:   In Person  Provider:   You may see Carlyle Dolly, MD or one of the following Advanced Practice Providers on your designated Care Team:    Bernerd Pho, PA-C   Ermalinda Barrios, PA-C     Signed, Kerin Ransom, [place]  07/08/2020 4:16 PM    [place]

## 2020-07-08 NOTE — Patient Instructions (Signed)
Medication Instructions:  Your physician recommends that you continue on your current medications as directed. Please refer to the Current Medication list given to you today.  *If you need a refill on your cardiac medications before your next appointment, please call your pharmacy*  Lab Work: You will have labs drawn today: TSH, CBC, BMET  If you have labs (blood work) drawn today and your tests are completely normal, you will receive your results only by: Marland Kitchen MyChart Message (if you have MyChart) OR . A paper copy in the mail If you have any lab test that is abnormal or we need to change your treatment, we will call you to review the results.  Testing/Procedures: None ordered today  Follow-Up: At [facility], you and your health needs are our priority.  As part of our continuing mission to provide you with exceptional heart care, we have created designated Provider Care Teams.  These Care Teams include your primary Cardiologist (physician) and Advanced Practice Providers (APPs -  Physician Assistants and Nurse Practitioners) who all work together to provide you with the care you need, when you need it.  Your next appointment:   4-6 week(s)  The format for your next appointment:   In Person  Provider:   You may see Carlyle Dolly, MD or one of the following Advanced Practice Providers on your designated Care Team:    [place], PA-C   Ermalinda Barrios, [place]

## 2020-07-08 NOTE — Telephone Encounter (Signed)
Attempt to reach, note patient was taken to the ED

## 2020-07-08 NOTE — Assessment & Plan Note (Signed)
Advanced dementia 

## 2020-07-08 NOTE — Telephone Encounter (Signed)
New message    Pt c/o swelling: STAT is pt has developed SOB within 24 hours  1) How much weight have you gained and in what time span? Not sure   2) If swelling, where is the swelling located? Legs are swollen over the compression stockings   Are you currently taking a fluid pill? no 3) Are you currently SOB? no  4) Do you have a log of your daily weights (if so, list)? no  5) Have you gained 3 pounds in a day or 5 pounds in a week? ?  6) Have you traveled recently? No   Per Caregiver patient was just released from ICU she is not eating, her legs are swollen, her blood sugar is out of control , scheduled patient for appointment with Katina Dung tomorrow but the caregiver wants to speak with a nurse

## 2020-07-09 ENCOUNTER — Encounter (INDEPENDENT_AMBULATORY_CARE_PROVIDER_SITE_OTHER): Payer: Self-pay | Admitting: Internal Medicine

## 2020-07-09 ENCOUNTER — Ambulatory Visit: Payer: BLUE CROSS/BLUE SHIELD | Admitting: Family Medicine

## 2020-07-09 ENCOUNTER — Ambulatory Visit (INDEPENDENT_AMBULATORY_CARE_PROVIDER_SITE_OTHER): Payer: Medicare Other | Admitting: Internal Medicine

## 2020-07-09 VITALS — BP 132/68 | HR 78 | Temp 97.7°F | Ht 65.0 in | Wt 133.0 lb

## 2020-07-09 DIAGNOSIS — E1165 Type 2 diabetes mellitus with hyperglycemia: Secondary | ICD-10-CM

## 2020-07-09 DIAGNOSIS — R630 Anorexia: Secondary | ICD-10-CM | POA: Diagnosis not present

## 2020-07-09 DIAGNOSIS — R5383 Other fatigue: Secondary | ICD-10-CM

## 2020-07-09 DIAGNOSIS — R5381 Other malaise: Secondary | ICD-10-CM | POA: Diagnosis not present

## 2020-07-09 DIAGNOSIS — N390 Urinary tract infection, site not specified: Secondary | ICD-10-CM

## 2020-07-09 NOTE — Progress Notes (Signed)
Metrics: Intervention Frequency ACO  Documented Smoking Status Yearly  Screened one or more times in 24 months  Cessation Counseling or  Active cessation medication Past 24 months  Past 24 months   Guideline developer: UpToDate (See UpToDate for funding source) Date Released: 2014       Wellness Office Visit  Subjective:  Patient ID: Amy Hoffman, female    DOB: Jun 19, 1941  Age: 79 y.o. MRN: [id number]  CC: This is an acute visit for lady who was discharged from the hospital 5 days ago on 07/04/2020.  She had gone to the hospital with what appears to be UTI with Klebsiella. HPI  Since coming home, she has been very anorexic and her blood sugar levels have been elevated to above 300.  She is a diabetic on Januvia.  She feels nauseous also. According to the caregiver who was present with her, her urine still has a strong odor.  She has completed the course of oral antibiotics that she was given upon discharge from the hospital. Past Medical History:  Diagnosis Date  . Abnormal uterine and vaginal bleeding, unspecified   . Allergic rhinitis due to pollen   . Anxiety   . Asthma   . Cancer [facility])    breast cancer - right  . Dementia ([place])   . Depression   . Diabetes mellitus without complication ([place])   . Essential (primary) hypertension   . Hyperlipidemia, unspecified   . Hypertension   . Major depressive disorder, single episode, unspecified   . Obesity, unspecified   . Pain in joint involving shoulder region   . Reflux esophagitis   . Trigger finger, acquired   . Type 2 diabetes mellitus with hyperglycemia ([place])   . Unspecified asthma with (acute) exacerbation   . 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 ([place])   . Unspecified osteoarthritis, unspecified site    Past Surgical History:  Procedure Laterality Date  . CHOLECYSTECTOMY    . MASTECTOMY Right   . RE-EXCISION OF BREAST LUMPECTOMY       Family History  Family history unknown: Yes    Social  History   Social History Narrative   Long term resident of [facility]    Social History   Tobacco Use  . Smoking status: Never Smoker  . Smokeless tobacco: Never Used  Substance Use Topics  . Alcohol use: No    Current Meds  Medication Sig  . acetaminophen (TYLENOL) 325 MG tablet Take 2 tablets (650 mg total) by mouth every 6 (six) hours as needed for mild pain (or Fever >/= 101).  Marland Kitchen amiodarone (PACERONE) 200 MG tablet Take 1 tablet (200 mg total) by mouth daily.  Marland Kitchen amLODipine (NORVASC) 5 MG tablet Take 1 tablet (5 mg total) by mouth daily.  Marland Kitchen anastrozole (ARIMIDEX) 1 MG tablet Take 1 tablet (1 mg total) by mouth daily.  Marland Kitchen apixaban (ELIQUIS) 2.5 MG TABS tablet Take 1 tablet (2.5 mg total) by mouth 2 (two) times daily.  Roseanne Kaufman [place]-Castor Oil [facility]) OINT Special Instructions: Apply to sacrum, bilateral buttocks and coccyx qshift for prevention. Every Shift Day, Evening, Night  . busPIRone (BUSPAR) 5 MG tablet Take 1 tablet (5 mg total) by mouth 2 (two) times daily.  . Multiple Vitamin (MULTIVITAMIN ADULT PO) Take 1 tablet by mouth daily.  . NON FORMULARY Diet Change: Regular with nectar thick liquids  . NON FORMULARY Magic Cup qhs daily  . simvastatin (ZOCOR) 20 MG tablet Take 1 tablet (20 mg total) by mouth daily.  Marland Kitchen  sitaGLIPtin (JANUVIA) 100 MG tablet Take 1 tablet (100 mg total) by mouth daily.      Depression screen PHQ 2/9 05/15/2020  Decreased Interest 0  Down, Depressed, Hopeless 0  PHQ - 2 Score 0     Objective:   Today's Vitals: BP 132/68   Pulse 78   Temp 97.7 F (36.5 C) (Temporal)   Ht 5\' 5"  (1.651 m)   Wt 133 lb (60.3 kg)   SpO2 93%   BMI 22.13 kg/m  Vitals with BMI 07/09/2020 07/08/2020 07/04/2020  Height 5\' 5"  5\' 5"  -  Weight 133 lbs 134 lbs 10 oz -  BMI 42.59 56.3 -  Systolic 875 643 329  Diastolic 68 58 53  Pulse 78 68 116     Physical Exam   She looks frail.  Her weight has been stable.  Blood pressure acceptable.  Lung fields are clear.   Heart sounds appear to be in sinus rhythm at the present time.  She does have peripheral pitting edema.  Due to her dementia, she is chronically confused.    Assessment   1. Uncontrolled type 2 diabetes mellitus with hyperglycemia ([place])   2. Anorexia   3. Urinary tract infection without hematuria, site unspecified   4. Malaise and fatigue       Tests ordered Orders Placed This Encounter  Procedures  . CBC  . COMPLETE METABOLIC PANEL WITH GFR  . Urinalysis w microscopic + reflex cultur  . T3, free  . T4  . TSH     Plan: 1. Blood work is ordered. 2. I have told the caregiver that if blood work shows that she is getting dehydrated, she may well have to go back and be admitted to the hospital. 3. Further recommendations will depend on these results and I have told her that an extra dose of Januvia could be given today pending the blood results if blood sugar levels are elevated.   No orders of the defined types were placed in this encounter.   Doree Albee, MD

## 2020-07-10 ENCOUNTER — Encounter (HOSPITAL_COMMUNITY): Payer: Self-pay

## 2020-07-10 ENCOUNTER — Other Ambulatory Visit: Payer: Self-pay

## 2020-07-10 ENCOUNTER — Inpatient Hospital Stay (HOSPITAL_COMMUNITY)
Admission: EM | Admit: 2020-07-10 | Discharge: 2020-07-13 | DRG: 640 | Disposition: A | Discharge status: Home Health | Payer: Medicare Other | Attending: Family Medicine | Admitting: Family Medicine

## 2020-07-10 ENCOUNTER — Emergency Department (HOSPITAL_COMMUNITY): Payer: Medicare Other

## 2020-07-10 DIAGNOSIS — E669 Obesity, unspecified: Secondary | ICD-10-CM | POA: Diagnosis present

## 2020-07-10 DIAGNOSIS — E860 Dehydration: Secondary | ICD-10-CM | POA: Diagnosis not present

## 2020-07-10 DIAGNOSIS — J918 Pleural effusion in other conditions classified elsewhere: Secondary | ICD-10-CM | POA: Diagnosis present

## 2020-07-10 DIAGNOSIS — N179 Acute kidney failure, unspecified: Secondary | ICD-10-CM | POA: Diagnosis present

## 2020-07-10 DIAGNOSIS — I110 Hypertensive heart disease with heart failure: Secondary | ICD-10-CM

## 2020-07-10 DIAGNOSIS — R404 Transient alteration of awareness: Secondary | ICD-10-CM | POA: Diagnosis not present

## 2020-07-10 DIAGNOSIS — D638 Anemia in other chronic diseases classified elsewhere: Secondary | ICD-10-CM | POA: Diagnosis present

## 2020-07-10 DIAGNOSIS — Z66 Do not resuscitate: Secondary | ICD-10-CM | POA: Diagnosis not present

## 2020-07-10 DIAGNOSIS — R627 Adult failure to thrive: Secondary | ICD-10-CM | POA: Diagnosis not present

## 2020-07-10 DIAGNOSIS — R5383 Other fatigue: Secondary | ICD-10-CM | POA: Diagnosis not present

## 2020-07-10 DIAGNOSIS — I5033 Acute on chronic diastolic (congestive) heart failure: Secondary | ICD-10-CM | POA: Diagnosis present

## 2020-07-10 DIAGNOSIS — F329 Major depressive disorder, single episode, unspecified: Secondary | ICD-10-CM | POA: Diagnosis present

## 2020-07-10 DIAGNOSIS — I130 Hypertensive heart and chronic kidney disease with heart failure and stage 1 through stage 4 chronic kidney disease, or unspecified chronic kidney disease: Secondary | ICD-10-CM | POA: Diagnosis present

## 2020-07-10 DIAGNOSIS — F0391 Unspecified dementia with behavioral disturbance: Secondary | ICD-10-CM | POA: Diagnosis not present

## 2020-07-10 DIAGNOSIS — F03918 Unspecified dementia, unspecified severity, with other behavioral disturbance: Secondary | ICD-10-CM | POA: Diagnosis present

## 2020-07-10 DIAGNOSIS — I495 Sick sinus syndrome: Secondary | ICD-10-CM | POA: Diagnosis present

## 2020-07-10 DIAGNOSIS — Z853 Personal history of malignant neoplasm of breast: Secondary | ICD-10-CM

## 2020-07-10 DIAGNOSIS — J9811 Atelectasis: Secondary | ICD-10-CM | POA: Diagnosis present

## 2020-07-10 DIAGNOSIS — F419 Anxiety disorder, unspecified: Secondary | ICD-10-CM | POA: Diagnosis present

## 2020-07-10 DIAGNOSIS — D509 Iron deficiency anemia, unspecified: Secondary | ICD-10-CM | POA: Diagnosis present

## 2020-07-10 DIAGNOSIS — Z9889 Other specified postprocedural states: Secondary | ICD-10-CM

## 2020-07-10 DIAGNOSIS — R609 Edema, unspecified: Secondary | ICD-10-CM

## 2020-07-10 DIAGNOSIS — K2100 Gastro-esophageal reflux disease with esophagitis, without bleeding: Secondary | ICD-10-CM | POA: Diagnosis present

## 2020-07-10 DIAGNOSIS — J90 Pleural effusion, not elsewhere classified: Secondary | ICD-10-CM | POA: Diagnosis not present

## 2020-07-10 DIAGNOSIS — R630 Anorexia: Secondary | ICD-10-CM | POA: Diagnosis not present

## 2020-07-10 DIAGNOSIS — E441 Mild protein-calorie malnutrition: Secondary | ICD-10-CM | POA: Diagnosis present

## 2020-07-10 DIAGNOSIS — E1165 Type 2 diabetes mellitus with hyperglycemia: Secondary | ICD-10-CM | POA: Diagnosis present

## 2020-07-10 DIAGNOSIS — I10 Essential (primary) hypertension: Secondary | ICD-10-CM | POA: Diagnosis not present

## 2020-07-10 DIAGNOSIS — R6251 Failure to thrive (child): Secondary | ICD-10-CM | POA: Diagnosis not present

## 2020-07-10 DIAGNOSIS — R5381 Other malaise: Secondary | ICD-10-CM | POA: Diagnosis not present

## 2020-07-10 DIAGNOSIS — N390 Urinary tract infection, site not specified: Secondary | ICD-10-CM | POA: Diagnosis not present

## 2020-07-10 DIAGNOSIS — E785 Hyperlipidemia, unspecified: Secondary | ICD-10-CM | POA: Diagnosis present

## 2020-07-10 DIAGNOSIS — E440 Moderate protein-calorie malnutrition: Secondary | ICD-10-CM | POA: Diagnosis present

## 2020-07-10 DIAGNOSIS — Z7901 Long term (current) use of anticoagulants: Secondary | ICD-10-CM

## 2020-07-10 DIAGNOSIS — Z79899 Other long term (current) drug therapy: Secondary | ICD-10-CM

## 2020-07-10 DIAGNOSIS — R0902 Hypoxemia: Secondary | ICD-10-CM | POA: Diagnosis not present

## 2020-07-10 DIAGNOSIS — Z79811 Long term (current) use of aromatase inhibitors: Secondary | ICD-10-CM

## 2020-07-10 DIAGNOSIS — N1832 Chronic kidney disease, stage 3b: Secondary | ICD-10-CM | POA: Diagnosis present

## 2020-07-10 DIAGNOSIS — E1122 Type 2 diabetes mellitus with diabetic chronic kidney disease: Secondary | ICD-10-CM | POA: Diagnosis present

## 2020-07-10 DIAGNOSIS — J9611 Chronic respiratory failure with hypoxia: Secondary | ICD-10-CM | POA: Diagnosis present

## 2020-07-10 DIAGNOSIS — J45909 Unspecified asthma, uncomplicated: Secondary | ICD-10-CM | POA: Diagnosis present

## 2020-07-10 DIAGNOSIS — Z885 Allergy status to narcotic agent status: Secondary | ICD-10-CM

## 2020-07-10 DIAGNOSIS — Z20822 Contact with and (suspected) exposure to covid-19: Secondary | ICD-10-CM | POA: Diagnosis not present

## 2020-07-10 DIAGNOSIS — E119 Type 2 diabetes mellitus without complications: Secondary | ICD-10-CM

## 2020-07-10 DIAGNOSIS — C50919 Malignant neoplasm of unspecified site of unspecified female breast: Secondary | ICD-10-CM | POA: Diagnosis present

## 2020-07-10 DIAGNOSIS — F039 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: Secondary | ICD-10-CM

## 2020-07-10 DIAGNOSIS — I480 Paroxysmal atrial fibrillation: Secondary | ICD-10-CM | POA: Diagnosis present

## 2020-07-10 DIAGNOSIS — Z6824 Body mass index (BMI) 24.0-24.9, adult: Secondary | ICD-10-CM

## 2020-07-10 DIAGNOSIS — Z888 Allergy status to other drugs, medicaments and biological substances status: Secondary | ICD-10-CM

## 2020-07-10 LAB — COMPREHENSIVE METABOLIC PANEL
ALT: 40 U/L (ref 0–44)
AST: 22 U/L (ref 15–41)
Albumin: 3 g/dL — ABNORMAL LOW (ref 3.5–5.0)
Alkaline Phosphatase: 98 U/L (ref 38–126)
Anion gap: 9 (ref 5–15)
BUN: 49 mg/dL — ABNORMAL HIGH (ref 8–23)
CO2: 26 mmol/L (ref 22–32)
Calcium: 8.5 mg/dL — ABNORMAL LOW (ref 8.9–10.3)
Chloride: 101 mmol/L (ref 98–111)
Creatinine, Ser: 1.54 mg/dL — ABNORMAL HIGH (ref 0.44–1.00)
GFR, Estimated: 34 mL/min — ABNORMAL LOW (ref >60)
Glucose, Bld: 260 mg/dL — ABNORMAL HIGH (ref 70–99)
Potassium: 4.2 mmol/L (ref 3.5–5.1)
Sodium: 136 mmol/L (ref 135–145)
Total Bilirubin: 0.6 mg/dL (ref 0.3–1.2)
Total Protein: 6.6 g/dL (ref 6.5–8.1)

## 2020-07-10 LAB — COMPLETE METABOLIC PANEL WITH GFR
AG Ratio: 1.1 (calc) (ref 1.0–2.5)
ALT: 41 U/L — ABNORMAL HIGH (ref 6–29)
AST: 28 U/L (ref 10–35)
Albumin: 3.5 g/dL — ABNORMAL LOW (ref 3.6–5.1)
Alkaline phosphatase (APISO): 120 U/L (ref 37–153)
BUN/Creatinine Ratio: 30 (calc) — ABNORMAL HIGH (ref 6–22)
BUN: 41 mg/dL — ABNORMAL HIGH (ref 7–25)
CO2: 26 mmol/L (ref 20–32)
Calcium: 9.2 mg/dL (ref 8.6–10.4)
Chloride: 102 mmol/L (ref 98–110)
Creat: 1.38 mg/dL — ABNORMAL HIGH (ref 0.60–0.93)
GFR, Est African American: 42 mL/min/{1.73_m2} — ABNORMAL LOW (ref >=60)
GFR, Est Non African American: 36 mL/min/{1.73_m2} — ABNORMAL LOW (ref >=60)
Globulin: 3.2 g/dL (calc) (ref 1.9–3.7)
Glucose, Bld: 237 mg/dL — ABNORMAL HIGH (ref 65–99)
Potassium: 4.8 mmol/L (ref 3.5–5.3)
Sodium: 141 mmol/L (ref 135–146)
Total Bilirubin: 0.7 mg/dL (ref 0.2–1.2)
Total Protein: 6.7 g/dL (ref 6.1–8.1)

## 2020-07-10 LAB — CBC WITH DIFFERENTIAL/PLATELET
Abs Immature Granulocytes: 0.03 10*3/uL (ref 0.00–0.07)
Basophils Absolute: 0 10*3/uL (ref 0.0–0.1)
Basophils Relative: 0 %
Eosinophils Absolute: 0 10*3/uL (ref 0.0–0.5)
Eosinophils Relative: 0 %
HCT: 32.1 % — ABNORMAL LOW (ref 36.0–46.0)
Hemoglobin: 9.3 g/dL — ABNORMAL LOW (ref 12.0–15.0)
Immature Granulocytes: 0 %
Lymphocytes Relative: 5 %
Lymphs Abs: 0.4 10*3/uL — ABNORMAL LOW (ref 0.7–4.0)
MCH: 25.6 pg — ABNORMAL LOW (ref 26.0–34.0)
MCHC: 29 g/dL — ABNORMAL LOW (ref 30.0–36.0)
MCV: 88.4 fL (ref 80.0–100.0)
Monocytes Absolute: 0.6 10*3/uL (ref 0.1–1.0)
Monocytes Relative: 6 %
Neutro Abs: 7.7 10*3/uL (ref 1.7–7.7)
Neutrophils Relative %: 89 %
Platelets: 350 10*3/uL (ref 150–400)
RBC: 3.63 MIL/uL — ABNORMAL LOW (ref 3.87–5.11)
RDW: 15.6 % — ABNORMAL HIGH (ref 11.5–15.5)
WBC: 8.7 10*3/uL (ref 4.0–10.5)
nRBC: 0 % (ref 0.0–0.2)

## 2020-07-10 LAB — CBC
HCT: 32.3 % — ABNORMAL LOW (ref 35.0–45.0)
Hemoglobin: 10 g/dL — ABNORMAL LOW (ref 11.7–15.5)
MCH: 27 pg (ref 27.0–33.0)
MCHC: 31 g/dL — ABNORMAL LOW (ref 32.0–36.0)
MCV: 87.3 fL (ref 80.0–100.0)
MPV: 11.7 fL (ref 7.5–12.5)
Platelets: 358 10*3/uL (ref 140–400)
RBC: 3.7 10*6/uL — ABNORMAL LOW (ref 3.80–5.10)
RDW: 14.1 % (ref 11.0–15.0)
WBC: 10.9 10*3/uL — ABNORMAL HIGH (ref 3.8–10.8)

## 2020-07-10 LAB — T4: T4, Total: 9.5 ug/dL (ref 5.1–11.9)

## 2020-07-10 LAB — URINALYSIS, ROUTINE W REFLEX MICROSCOPIC
Bacteria, UA: NONE SEEN
Bilirubin Urine: NEGATIVE
Glucose, UA: 50 mg/dL — AB
Hgb urine dipstick: NEGATIVE
Ketones, ur: NEGATIVE mg/dL
Leukocytes,Ua: NEGATIVE
Nitrite: NEGATIVE
Protein, ur: 30 mg/dL — AB
Specific Gravity, Urine: 1.016 (ref 1.005–1.030)
pH: 5 (ref 5.0–8.0)

## 2020-07-10 LAB — TSH: TSH: 1.74 mIU/L (ref 0.40–4.50)

## 2020-07-10 LAB — T3, FREE: T3, Free: 1.8 pg/mL — ABNORMAL LOW (ref 2.3–4.2)

## 2020-07-10 LAB — RESPIRATORY PANEL BY RT PCR (FLU A&B, COVID)
Influenza A by PCR: NEGATIVE
Influenza B by PCR: NEGATIVE
SARS Coronavirus 2 by RT PCR: NEGATIVE

## 2020-07-10 LAB — LACTIC ACID, PLASMA: Lactic Acid, Venous: 1.2 mmol/L (ref 0.5–1.9)

## 2020-07-10 LAB — CBG MONITORING, ED: Glucose-Capillary: 238 mg/dL — ABNORMAL HIGH (ref 70–99)

## 2020-07-10 LAB — MAGNESIUM: Magnesium: 2.3 mg/dL (ref 1.7–2.4)

## 2020-07-10 MED ORDER — SODIUM CHLORIDE 0.9 % IV BOLUS
500.0000 mL | Freq: Once | INTRAVENOUS | Status: AC
  Administered 2020-07-10: 500 mL via INTRAVENOUS

## 2020-07-10 NOTE — H&P (Signed)
History and Physical    Amy Hoffman HDQ:[id number] DOB: December 02, 1940 DOA: 07/10/2020  PCP: [name], NP  Patient coming from: Home.  I have personally briefly reviewed patient's old medical records in [place]  Chief Complaint: Bilateral leg swelling.  HPI: Amy Hoffman is a 79 y.o. female with medical history significant of abnormal uterine bleeding, allergic rhinitis, anxiety, depression, dementia, right breast cancer, type 2 diabetes mellitus, hypertension, hyperlipidemia, obesity, GERD, osteoarthritis who was recently admitted from October 17 through October 21, who today is brought to the emergency department due to bilateral lower extremities.  Her primary care provider ordered labs on her yesterday, which show worsening renal function and her family was asked to bring the patient to the emergency department for further evaluation.  Patient was confused and unable to provide further information.  ED Course: Initial vital signs were temperature 97.9 F, pulse 70, respirations 16, BP 162/74 mmHg O2 sat was 96% on nasal cannula oxygen.The patient received 500 mL bolus in the emergency department.  Labs: Urinalysis showed glucosuria 50 and proteinuria 30 mg/dL, but was otherwise normal.  CBC showed a white count 8.7, hemoglobin 9.3 g/dL and platelets 350.  Lactic acid was normal.  SARS coronavirus 2 and influenza PCR was negative. BNP was 92.0 pg/mL.CMP shows normal electrolytes when calcium is corrected to albumin.  Glucose 260, BUN 49 creatinine 1.54 mg/dL.  Her creatinine level was 1.08 mg/dL 6 days ago.  Imaging: 2 view chest radiograph shows bilateral pleural effusions, left greater than right and bibasilar atelectasis or infiltrates, which increased since the prior exam.  Review of Systems: As per HPI otherwise all other systems reviewed and are negative.  Past Medical History:  Diagnosis Date  . Abnormal uterine and vaginal bleeding, unspecified   . Allergic  rhinitis due to pollen   . Anxiety   . Asthma   . Cancer [facility])    breast cancer - right  . Dementia ([place])   . Depression   . Diabetes mellitus without complication ([place])   . Essential (primary) hypertension   . Hyperlipidemia, unspecified   . Hypertension   . Major depressive disorder, single episode, unspecified   . Obesity, unspecified   . Pain in joint involving shoulder region   . Reflux esophagitis   . Trigger finger, acquired   . Type 2 diabetes mellitus with hyperglycemia ([place])   . Unspecified asthma with (acute) exacerbation   . 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 ([place])   . Unspecified osteoarthritis, unspecified site     Past Surgical History:  Procedure Laterality Date  . CHOLECYSTECTOMY    . MASTECTOMY Right   . RE-EXCISION OF BREAST LUMPECTOMY      Social History  reports that she has never smoked. She has never used smokeless tobacco. She reports that she does not drink alcohol and does not use drugs.  Allergies  Allergen Reactions  . Codeine   . Zantac [Ranitidine Hcl]     Family History  Family history unknown: Yes   Unable to obtain family history at this time.  History of dementia.  Prior to Admission medications   Medication Sig Start Date End Date Taking? Authorizing Provider  acetaminophen (TYLENOL) 325 MG tablet Take 2 tablets (650 mg total) by mouth every 6 (six) hours as needed for mild pain (or Fever >/= 101). 02/14/20   Roxan Hockey, MD  amiodarone (PACERONE) 200 MG tablet Take 1 tablet (200 mg total) by mouth daily. 06/27/20   Nyoka Cowden,  Phylis Bougie, NP  amLODipine (NORVASC) 5 MG tablet Take 1 tablet (5 mg total) by mouth daily. 06/27/20   Gerlene Fee, NP  anastrozole (ARIMIDEX) 1 MG tablet Take 1 tablet (1 mg total) by mouth daily. 06/27/20   Gerlene Fee, NP  apixaban (ELIQUIS) 2.5 MG TABS tablet Take 1 tablet (2.5 mg total) by mouth 2 (two) times daily. 07/04/20   Nita Sells, MD  Janne Lab Oil  [facility]) OINT Special Instructions: Apply to sacrum, bilateral buttocks and coccyx qshift for prevention. Every Shift Day, Evening, Night 03/01/20   [provider]  busPIRone (BUSPAR) 5 MG tablet Take 1 tablet (5 mg total) by mouth 2 (two) times daily. 06/27/20   Gerlene Fee, NP  Multiple Vitamin (MULTIVITAMIN ADULT PO) Take 1 tablet by mouth daily.    [provider]  NON FORMULARY Diet Change: Regular with nectar thick liquids 02/26/20   [provider]  NON FORMULARY Magic Cup qhs daily 05/10/20   [provider]  simvastatin (ZOCOR) 20 MG tablet Take 1 tablet (20 mg total) by mouth daily. 06/27/20   Gerlene Fee, NP  sitaGLIPtin (JANUVIA) 100 MG tablet Take 1 tablet (100 mg total) by mouth daily. 06/27/20   Gerlene Fee, NP   Physical Exam: Vitals:   07/10/20 1519 07/10/20 2032  BP:  (!) 162/74  Pulse:  63  Resp:  18  Temp:  97.9 F (36.6 C)  TempSrc:  Oral  SpO2:  100%  Weight: 68 kg   Height: 5\' 5"  (1.651 m)    Constitutional: Chronically ill-appearing, but in NAD, calm, comfortable Eyes: PERRL, lids and conjunctivae normal ENMT: Mucous membranes are dry. Posterior pharynx clear of any exudate or lesions. Neck: normal, supple, no masses, no thyromegaly Respiratory: Decreased breath sounds on bases, otherwise clear to auscultation bilaterally, no wheezing, no crackles. Normal respiratory effort. No accessory muscle use.  Cardiovascular: Regular rate and rhythm, no murmurs / rubs / gallops. No extremity edema. 2+ pedal pulses. No carotid bruits.  Abdomen: Nondistended.  BS positive.  Soft, no tenderness, no masses palpated. No hepatosplenomegaly.  Musculoskeletal: no clubbing / cyanosis. Good ROM, no contractures. Normal muscle tone.  Skin: no rashes, lesions, ulcers on limited dermatological examination. Neurologic: CN 2-12 grossly intact. Sensation intact, DTR normal. Strength 5/5 in all 4.  Psychiatric: Normal judgment and  insight. Alert and oriented x 3. Normal mood.   Labs on Admission: I have personally reviewed following labs and imaging studies  CBC: Recent Labs  Lab 07/04/20 0439 07/09/20 0828 07/10/20 1534  WBC 7.5 10.9* 8.7  NEUTROABS 5.8  --  7.7  HGB 8.9* 10.0* 9.3*  HCT 30.3* 32.3* 32.1*  MCV 87.8 87.3 88.4  PLT 300 358 465    Basic Metabolic Panel: Recent Labs  Lab 07/04/20 0439 07/09/20 0828 07/10/20 1534  NA 139 141 136  K 4.5 4.8 4.2  CL 103 102 101  CO2 25 26 26   GLUCOSE 182* 237* 260*  BUN 44* 41* 49*  CREATININE 1.08* 1.38* 1.54*  CALCIUM 8.7* 9.2 8.5*  MG 2.5*  --  2.3    GFR: Estimated Creatinine Clearance: 26.7 mL/min (A) (by C-G formula based on SCr of 1.54 mg/dL (H)).  Liver Function Tests: Recent Labs  Lab 07/04/20 0439 07/09/20 0828 07/10/20 1534  AST 22 28 22   ALT 36 41* 40  ALKPHOS 97  --  98  BILITOT 0.8 0.7 0.6  PROT 6.3* 6.7 6.6  ALBUMIN 2.8*  --  3.0*    Urine analysis:    Component Value Date/Time   COLORURINE YELLOW 07/10/2020 1915   APPEARANCEUR CLEAR 07/10/2020 1915   LABSPEC 1.016 07/10/2020 1915   PHURINE 5.0 07/10/2020 1915   GLUCOSEU 50 (A) 07/10/2020 1915   HGBUR NEGATIVE 07/10/2020 1915   BILIRUBINUR NEGATIVE 07/10/2020 [place] NEGATIVE 07/10/2020 1915   PROTEINUR 30 (A) 07/10/2020 1915   UROBILINOGEN 0.2 01/23/2013 2317   NITRITE NEGATIVE 07/10/2020 1915   LEUKOCYTESUR NEGATIVE 07/10/2020 1915    Radiological Exams on Admission: DG Chest 2 View  Result Date: 07/10/2020 CLINICAL DATA:  79 year old female with altered mental status. EXAM: CHEST - 2 VIEW COMPARISON:  Chest radiograph dated 06/30/2020. FINDINGS: There are bilateral pleural effusions, left greater right, which have increased in size compared to the prior radiograph. Bibasilar atelectasis or infiltrate, also increased since the prior radiograph. No pneumothorax. Stable cardiomegaly. Atherosclerotic calcification of the aorta. No acute osseous  pathology. Surgical clips over the right chest as well as sclerotic lesion of the proximal left humeral diaphysis similar to prior radiograph. IMPRESSION: Bilateral pleural effusions, left greater right, and bibasilar atelectasis or infiltrate, increased since the prior radiograph. Electronically Signed   By: Anner Crete M.D.   On: 07/10/2020 15:51    EKG: Independently reviewed. Poor data quality on tracings.  Assessment/Plan Principal Problem:   Failure to thrive in adult Admit to MedSurg/inpatient. Consult nutritional services. Consult PT/OT. Consult TOC team. Consider palliative care evaluation.  Active Problems:   AKI (acute kidney injury) ([place]) Likely due to decreased oral intake. Overnight IV hydration. Follow renal function electrolytes.    Chronic respiratory failure with hypoxia (HCC) Has not been using oxygen at home. Resume supplemental oxygen.    Hyperlipidemia, unspecified Continue simvastatin 20 mg p.o. daily.    Type 2 diabetes Mellitus (HCC) Carbohydrate modified diet. Continue Januvia. CBG monitoring before meals and bedtime.    Paroxysmal atrial fibrillation (HCC) CHA?DS?-VASc Score of at least 6. Continue amiodarone 200 mg p.o. twice daily. Continue apixaban 2.5 mg p.o. twice daily.    Dementia with behavioral disturbance (HCC) Continue BuSpar p.o. twice daily.    Microcytic anemia Monitor H&H.    Mild protein malnutrition ([place]) Consider consult by nutritional services.    Breast cancer Continue Arimidex    DVT prophylaxis:  On apixaban. Code Status:   Full code. Family Communication: Disposition Plan:   Patient is from:  Home.  Anticipated DC to:  To be determined.  Anticipated DC date:  07/12/2020 or 07/13/2020.  Anticipated DC barriers: Clinical and social status.  Consults called:  TOC team. Admission status:  Observation/telemetry.   Severity of Illness: High due to worsening mental status with inability to do basic daily  activities, decreased oral intake and malnutrition.  Reubin Milan MD [facility]  How to contact the [facility] Attending or Consulting provider [place] or covering provider during after hours [place], for this patient?   1. Check the care team in [facility] and look for a) attending/consulting TR[facility] provider listed and b) the [facility] team listed 2. Log into [email or web address] and use [place]'s universal password to access. If you do not have the password, please contact the hospital operator. 3. Locate the [facility] provider you are looking for under [facility] and [place] to a number that you can be directly reached. 4. If you still have difficulty reaching the provider, please [place] the [facility] (Director on Call) for the Hospitalists listed on amion for assistance.  07/10/2020, 11:58 PM   This document was prepared using Dragon voice recognition software and may contain some unintended transcription errors.

## 2020-07-10 NOTE — ED Triage Notes (Signed)
Pt brought to ED via [facility] for bilateral pitting edema. O2 sats 92-93%. Pt suppose to be on O2 at home but does not use it.

## 2020-07-10 NOTE — ED Provider Notes (Signed)
[facility] EMERGENCY DEPARTMENT Provider Note   CSN: [id number] Arrival date & time: 07/10/20  1509     History Chief Complaint  Patient presents with  . Leg Swelling    Amy Hoffman is a 79 y.o. female.  Pt presents to the ED today with failure to thrive.  Pt went to her pcp office yesterday as a follow up from an admission from 10/17-21.  She was admitted for tachy-brady syndrome.  Pt also had a UTI.  Since d/c, pt has not been eating or drinking much.  She was d/c with oxygen, but has not been wanting to wear it.  Pt's pcp did labs yesterday which showed her bun/cr elevating, so wanted family to bring her to the ER for possible admission.  Pt has no complaints.          Past Medical History:  Diagnosis Date  . Abnormal uterine and vaginal bleeding, unspecified   . Allergic rhinitis due to pollen   . Anxiety   . Asthma   . Cancer [facility])    breast cancer - right  . Dementia ([place])   . Depression   . Diabetes mellitus without complication ([place])   . Essential (primary) hypertension   . Hyperlipidemia, unspecified   . Hypertension   . Major depressive disorder, single episode, unspecified   . Obesity, unspecified   . Pain in joint involving shoulder region   . Reflux esophagitis   . Trigger finger, acquired   . Type 2 diabetes mellitus with hyperglycemia ([place])   . Unspecified asthma with (acute) exacerbation   . 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 ([place])   . Unspecified osteoarthritis, unspecified site     Patient Active Problem List   Diagnosis Date Noted  . Failure to thrive in adult 07/10/2020  . Anticoagulated 07/08/2020  . Tachycardia-bradycardia syndrome ([place])   . Acute lower UTI 07/01/2020  . Bradycardia 06/30/2020  . 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 Chronic constipation 04/23/2020  . Chronic respiratory failure with hypoxia ([place]) 03/09/2020  . AKI (acute kidney injury) ([place]) 03/01/2020  . Controlled type 2 diabetes mellitus  without complication, without long-term current use of insulin ([place]) 02/26/2020  . 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 Hyperlipidemia associated with type 2 diabetes mellitus ([place]) 02/16/2020  . 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 Do not resuscitate status   . CHF (congestive heart failure) ([place]) 02/05/2020  . Hypokalemia   . Medicare annual wellness visit, subsequent   . Dementia with behavioral disturbance ([place])   . Atrial fibrillation ([place]) 12/16/2019  . Hyperlipidemia, unspecified   . Allergic rhinitis due to pollen   . Essential hypertension   . Major depressive disorder, single episode, unspecified   . Unspecified asthma, uncomplicated   . Dysphagia 07/10/2019  . Breast CA ([place]) 01/30/2014    Past Surgical History:  Procedure Laterality Date  . CHOLECYSTECTOMY    . MASTECTOMY Right   . RE-EXCISION OF BREAST LUMPECTOMY       OB History   No obstetric history on file.     Family History  Family history unknown: Yes    Social History   Tobacco Use  . Smoking status: Never Smoker  . Smokeless tobacco: Never Used  Vaping Use  . Vaping Use: Never used  Substance Use Topics  . Alcohol use: No  . Drug use: No    Home Medications Prior to Admission medications   Medication  Sig Start Date End Date Taking? Authorizing Provider  acetaminophen (TYLENOL) 325 MG tablet Take 2 tablets (650 mg total) by mouth every 6 (six) hours as needed for mild pain (or Fever >/= 101). 02/14/20   Roxan Hockey, MD  amiodarone (PACERONE) 200 MG tablet Take 1 tablet (200 mg total) by mouth daily. 06/27/20   Gerlene Fee, NP  amLODipine (NORVASC) 5 MG tablet Take 1 tablet (5 mg total) by mouth daily. 06/27/20   Gerlene Fee, NP  anastrozole (ARIMIDEX) 1 MG tablet Take 1 tablet (1 mg total) by mouth daily. 06/27/20   Gerlene Fee, NP  apixaban (ELIQUIS) 2.5 MG TABS tablet Take 1 tablet  (2.5 mg total) by mouth 2 (two) times daily. 07/04/20   Nita Sells, MD  Janne Lab Oil [facility]) OINT Special Instructions: Apply to sacrum, bilateral buttocks and coccyx qshift for prevention. Every Shift Day, Evening, Night 03/01/20   [provider]  busPIRone (BUSPAR) 5 MG tablet Take 1 tablet (5 mg total) by mouth 2 (two) times daily. 06/27/20   Gerlene Fee, NP  Multiple Vitamin (MULTIVITAMIN ADULT PO) Take 1 tablet by mouth daily.    [provider]  NON FORMULARY Diet Change: Regular with nectar thick liquids 02/26/20   [provider]  NON FORMULARY Magic Cup qhs daily 05/10/20   [provider]  simvastatin (ZOCOR) 20 MG tablet Take 1 tablet (20 mg total) by mouth daily. 06/27/20   Gerlene Fee, NP  sitaGLIPtin (JANUVIA) 100 MG tablet Take 1 tablet (100 mg total) by mouth daily. 06/27/20   Gerlene Fee, NP    Allergies    Codeine and Zantac [ranitidine hcl]  Review of Systems   Review of Systems  Unable to perform ROS: Dementia    Physical Exam Updated Vital Signs BP (!) 162/74 (BP Location: Right Arm)   Pulse 63   Temp 97.9 F (36.6 C) (Oral)   Resp 18   Ht 5\' 5"  (1.651 m)   Wt 68 kg   SpO2 100%   BMI 24.96 kg/m   Physical Exam Vitals and nursing note reviewed.  Constitutional:      Appearance: Normal appearance.  HENT:     Head: Normocephalic and atraumatic.     Right Ear: External ear normal.     Left Ear: External ear normal.     Nose: Nose normal.     Mouth/Throat:     Mouth: Mucous membranes are dry.  Eyes:     Extraocular Movements: Extraocular movements intact.     Conjunctiva/sclera: Conjunctivae normal.     Pupils: Pupils are equal, round, and reactive to light.  Cardiovascular:     Rate and Rhythm: Normal rate and regular rhythm.     Pulses: Normal pulses.     Heart sounds: Normal heart sounds.  Pulmonary:     Effort: Pulmonary effort is normal.     Breath sounds: Normal breath  sounds.  Abdominal:     General: Abdomen is flat. Bowel sounds are normal.     Palpations: Abdomen is soft.  Musculoskeletal:     Cervical back: Normal range of motion and neck supple.     Right lower leg: Edema present.     Left lower leg: Edema present.  Skin:    General: Skin is warm.     Capillary Refill: Capillary refill takes less than 2 seconds.  Neurological:     General: No focal deficit present.     Mental  Status: She is alert. Mental status is at baseline.  Psychiatric:        Mood and Affect: Mood normal.        Behavior: Behavior normal.        Thought Content: Thought content normal.        Judgment: Judgment normal.     ED Results / Procedures / Treatments   Labs (all labs ordered are listed, but only abnormal results are displayed) Labs Reviewed  CBC WITH DIFFERENTIAL/PLATELET - Abnormal; Notable for the following components:      Result Value   RBC 3.63 (*)    Hemoglobin 9.3 (*)    HCT 32.1 (*)    MCH 25.6 (*)    MCHC 29.0 (*)    RDW 15.6 (*)    Lymphs Abs 0.4 (*)    All other components within normal limits  COMPREHENSIVE METABOLIC PANEL - Abnormal; Notable for the following components:   Glucose, Bld 260 (*)    BUN 49 (*)    Creatinine, Ser 1.54 (*)    Calcium 8.5 (*)    Albumin 3.0 (*)    GFR, Estimated 34 (*)    All other components within normal limits  URINALYSIS, ROUTINE W REFLEX MICROSCOPIC - Abnormal; Notable for the following components:   Glucose, UA 50 (*)    Protein, ur 30 (*)    All other components within normal limits  CBG MONITORING, ED - Abnormal; Notable for the following components:   Glucose-Capillary 238 (*)    All other components within normal limits  RESPIRATORY PANEL BY RT PCR (FLU A&B, COVID)  URINE CULTURE  LACTIC ACID, PLASMA  MAGNESIUM    EKG EKG Interpretation  Date/Time:  Wednesday July 10 2020 16:07:56 EDT Ventricular Rate:  62 PR Interval:  164 QRS Duration: 84 QT Interval:  440 QTC  Calculation: 446 R Axis:   35 Text Interpretation: Normal sinus rhythm Confirmed by Isla Pence [phone]) on 07/10/2020 5:03:37 PM   Radiology DG Chest 2 View  Result Date: 07/10/2020 CLINICAL DATA:  79 year old female with altered mental status. EXAM: CHEST - 2 VIEW COMPARISON:  Chest radiograph dated 06/30/2020. FINDINGS: There are bilateral pleural effusions, left greater right, which have increased in size compared to the prior radiograph. Bibasilar atelectasis or infiltrate, also increased since the prior radiograph. No pneumothorax. Stable cardiomegaly. Atherosclerotic calcification of the aorta. No acute osseous pathology. Surgical clips over the right chest as well as sclerotic lesion of the proximal left humeral diaphysis similar to prior radiograph. IMPRESSION: Bilateral pleural effusions, left greater right, and bibasilar atelectasis or infiltrate, increased since the prior radiograph. Electronically Signed   By: Anner Crete M.D.   On: 07/10/2020 15:51    Procedures Procedures (including critical care time)  Medications Ordered in ED Medications  sodium chloride 0.9 % bolus 500 mL (0 mLs Intravenous Stopped 07/10/20 1812)    ED Course  I have reviewed the triage vital signs and the nursing notes.  Pertinent labs & imaging results that were available during my care of the patient were reviewed by me and considered in my medical decision making (see chart for details).    MDM Rules/Calculators/A&P                          Pt's BUN/Cr is trending upwards.  Pt has not been interested in anything to drink or eat here.    Pt given gentle hydration b/c she does have pleural  effusions and peripheral edema along with the elevation in her bun/cr.  Pt d/w Dr. Olevia Bowens (triad) for admission.   Final Clinical Impression(s) / ED Diagnoses Final diagnoses:  Peripheral edema  Failure to thrive in adult  Pleural effusion, bilateral  AKI (acute kidney injury) [facility])    Rx / DC  Orders ED Discharge Orders    None       Isla Pence, MD 07/10/20 2209

## 2020-07-10 NOTE — Progress Notes (Signed)
Please call the patient's home and let the caregiver know that the hemoglobin is actually improving now which is good news but her renal function is worse.  She needs to make sure she drinks lots of water.Follow-up as scheduled.

## 2020-07-10 NOTE — Progress Notes (Signed)
In this case, please ask the caregiver to take the patient to the emergency room for possible admission.

## 2020-07-10 NOTE — Progress Notes (Signed)
Amy Hoffman, Caretaker is going to call 911. She will have them to transport her to ER. Pt sounds like a "failure to thrive" base on information caregiver stated. Pt is not drinking, not eatting, sore on legs, ble swollen and weeping fluid. Pt can't hardly hold her head up.

## 2020-07-11 ENCOUNTER — Inpatient Hospital Stay (HOSPITAL_COMMUNITY): Payer: Medicare Other

## 2020-07-11 ENCOUNTER — Encounter (HOSPITAL_COMMUNITY): Payer: Self-pay | Admitting: Family Medicine

## 2020-07-11 DIAGNOSIS — I130 Hypertensive heart and chronic kidney disease with heart failure and stage 1 through stage 4 chronic kidney disease, or unspecified chronic kidney disease: Secondary | ICD-10-CM | POA: Diagnosis present

## 2020-07-11 DIAGNOSIS — R627 Adult failure to thrive: Secondary | ICD-10-CM | POA: Diagnosis not present

## 2020-07-11 DIAGNOSIS — Z853 Personal history of malignant neoplasm of breast: Secondary | ICD-10-CM | POA: Diagnosis not present

## 2020-07-11 DIAGNOSIS — J90 Pleural effusion, not elsewhere classified: Secondary | ICD-10-CM | POA: Diagnosis not present

## 2020-07-11 DIAGNOSIS — E1122 Type 2 diabetes mellitus with diabetic chronic kidney disease: Secondary | ICD-10-CM | POA: Diagnosis present

## 2020-07-11 DIAGNOSIS — D638 Anemia in other chronic diseases classified elsewhere: Secondary | ICD-10-CM | POA: Diagnosis present

## 2020-07-11 DIAGNOSIS — Z7901 Long term (current) use of anticoagulants: Secondary | ICD-10-CM | POA: Diagnosis not present

## 2020-07-11 DIAGNOSIS — F329 Major depressive disorder, single episode, unspecified: Secondary | ICD-10-CM | POA: Diagnosis present

## 2020-07-11 DIAGNOSIS — J948 Other specified pleural conditions: Secondary | ICD-10-CM | POA: Diagnosis not present

## 2020-07-11 DIAGNOSIS — D509 Iron deficiency anemia, unspecified: Secondary | ICD-10-CM | POA: Diagnosis not present

## 2020-07-11 DIAGNOSIS — N179 Acute kidney failure, unspecified: Secondary | ICD-10-CM | POA: Diagnosis present

## 2020-07-11 DIAGNOSIS — I495 Sick sinus syndrome: Secondary | ICD-10-CM | POA: Diagnosis present

## 2020-07-11 DIAGNOSIS — F419 Anxiety disorder, unspecified: Secondary | ICD-10-CM | POA: Diagnosis present

## 2020-07-11 DIAGNOSIS — Z66 Do not resuscitate: Secondary | ICD-10-CM | POA: Diagnosis present

## 2020-07-11 DIAGNOSIS — Z6824 Body mass index (BMI) 24.0-24.9, adult: Secondary | ICD-10-CM | POA: Diagnosis not present

## 2020-07-11 DIAGNOSIS — C50911 Malignant neoplasm of unspecified site of right female breast: Secondary | ICD-10-CM | POA: Diagnosis not present

## 2020-07-11 DIAGNOSIS — K2100 Gastro-esophageal reflux disease with esophagitis, without bleeding: Secondary | ICD-10-CM | POA: Diagnosis present

## 2020-07-11 DIAGNOSIS — I480 Paroxysmal atrial fibrillation: Secondary | ICD-10-CM

## 2020-07-11 DIAGNOSIS — E860 Dehydration: Secondary | ICD-10-CM | POA: Diagnosis present

## 2020-07-11 DIAGNOSIS — Z20822 Contact with and (suspected) exposure to covid-19: Secondary | ICD-10-CM | POA: Diagnosis present

## 2020-07-11 DIAGNOSIS — E785 Hyperlipidemia, unspecified: Secondary | ICD-10-CM

## 2020-07-11 DIAGNOSIS — J9811 Atelectasis: Secondary | ICD-10-CM | POA: Diagnosis present

## 2020-07-11 DIAGNOSIS — J9611 Chronic respiratory failure with hypoxia: Secondary | ICD-10-CM | POA: Diagnosis present

## 2020-07-11 DIAGNOSIS — J45909 Unspecified asthma, uncomplicated: Secondary | ICD-10-CM | POA: Diagnosis present

## 2020-07-11 DIAGNOSIS — I5033 Acute on chronic diastolic (congestive) heart failure: Secondary | ICD-10-CM | POA: Diagnosis present

## 2020-07-11 DIAGNOSIS — J918 Pleural effusion in other conditions classified elsewhere: Secondary | ICD-10-CM | POA: Diagnosis present

## 2020-07-11 DIAGNOSIS — R609 Edema, unspecified: Secondary | ICD-10-CM | POA: Diagnosis not present

## 2020-07-11 DIAGNOSIS — E669 Obesity, unspecified: Secondary | ICD-10-CM | POA: Diagnosis present

## 2020-07-11 DIAGNOSIS — E440 Moderate protein-calorie malnutrition: Secondary | ICD-10-CM | POA: Diagnosis present

## 2020-07-11 DIAGNOSIS — F0391 Unspecified dementia with behavioral disturbance: Secondary | ICD-10-CM | POA: Diagnosis present

## 2020-07-11 DIAGNOSIS — Z79899 Other long term (current) drug therapy: Secondary | ICD-10-CM | POA: Diagnosis not present

## 2020-07-11 DIAGNOSIS — E441 Mild protein-calorie malnutrition: Secondary | ICD-10-CM | POA: Diagnosis not present

## 2020-07-11 DIAGNOSIS — N1832 Chronic kidney disease, stage 3b: Secondary | ICD-10-CM | POA: Diagnosis present

## 2020-07-11 LAB — URINALYSIS W MICROSCOPIC + REFLEX CULTURE
Bacteria, UA: NONE SEEN /HPF
Bilirubin Urine: NEGATIVE
Glucose, UA: NEGATIVE
Hgb urine dipstick: NEGATIVE
Ketones, ur: NEGATIVE
Leukocyte Esterase: NEGATIVE
Nitrites, Initial: NEGATIVE
Specific Gravity, Urine: 1.017 (ref 1.001–1.03)
pH: <=5 (ref 5.0–8.0)

## 2020-07-11 LAB — COMPREHENSIVE METABOLIC PANEL
ALT: 38 U/L (ref 0–44)
AST: 23 U/L (ref 15–41)
Albumin: 3 g/dL — ABNORMAL LOW (ref 3.5–5.0)
Alkaline Phosphatase: 97 U/L (ref 38–126)
Anion gap: 9 (ref 5–15)
BUN: 46 mg/dL — ABNORMAL HIGH (ref 8–23)
CO2: 24 mmol/L (ref 22–32)
Calcium: 8.3 mg/dL — ABNORMAL LOW (ref 8.9–10.3)
Chloride: 104 mmol/L (ref 98–111)
Creatinine, Ser: 1.32 mg/dL — ABNORMAL HIGH (ref 0.44–1.00)
GFR, Estimated: 41 mL/min — ABNORMAL LOW (ref >60)
Glucose, Bld: 190 mg/dL — ABNORMAL HIGH (ref 70–99)
Potassium: 4.4 mmol/L (ref 3.5–5.1)
Sodium: 137 mmol/L (ref 135–145)
Total Bilirubin: 0.6 mg/dL (ref 0.3–1.2)
Total Protein: 6.5 g/dL (ref 6.5–8.1)

## 2020-07-11 LAB — GLUCOSE, CAPILLARY
Glucose-Capillary: 172 mg/dL — ABNORMAL HIGH (ref 70–99)
Glucose-Capillary: 154 mg/dL — ABNORMAL HIGH (ref 70–99)
Glucose-Capillary: 104 mg/dL — ABNORMAL HIGH (ref 70–99)

## 2020-07-11 LAB — LACTATE DEHYDROGENASE, PLEURAL OR PERITONEAL FLUID: LD, Fluid: 85 U/L — ABNORMAL HIGH (ref 3–23)

## 2020-07-11 LAB — GLUCOSE, PLEURAL OR PERITONEAL FLUID: Glucose, Fluid: 204 mg/dL

## 2020-07-11 LAB — BODY FLUID CELL COUNT WITH DIFFERENTIAL
Lymphs, Fluid: 60 %
Monocyte-Macrophage-Serous Fluid: 16 % — ABNORMAL LOW (ref 50–90)
Neutrophil Count, Fluid: 24 % (ref 0–25)
Other Cells, Fluid: REACTIVE %
Total Nucleated Cell Count, Fluid: 707 cu mm (ref 0–1000)

## 2020-07-11 LAB — CBC
HCT: 33.2 % — ABNORMAL LOW (ref 36.0–46.0)
Hemoglobin: 9.5 g/dL — ABNORMAL LOW (ref 12.0–15.0)
MCH: 25.8 pg — ABNORMAL LOW (ref 26.0–34.0)
MCHC: 28.6 g/dL — ABNORMAL LOW (ref 30.0–36.0)
MCV: 90.2 fL (ref 80.0–100.0)
Platelets: 326 10*3/uL (ref 150–400)
RBC: 3.68 MIL/uL — ABNORMAL LOW (ref 3.87–5.11)
RDW: 15.9 % — ABNORMAL HIGH (ref 11.5–15.5)
WBC: 8.3 10*3/uL (ref 4.0–10.5)
nRBC: 0 % (ref 0.0–0.2)

## 2020-07-11 LAB — GRAM STAIN: Gram Stain: NONE SEEN

## 2020-07-11 LAB — NO CULTURE INDICATED

## 2020-07-11 LAB — BRAIN NATRIURETIC PEPTIDE: B Natriuretic Peptide: 92 pg/mL (ref 0.0–100.0)

## 2020-07-11 LAB — PROTEIN, BODY FLUID (OTHER): Total Protein, Body Fluid Other: <3

## 2020-07-11 MED ORDER — ACETAMINOPHEN 325 MG PO TABS
650.0000 mg | ORAL_TABLET | Freq: Four times a day (QID) | ORAL | Status: DC | PRN
  Administered 2020-07-12: 650 mg via ORAL
  Filled 2020-07-11: qty 2

## 2020-07-11 MED ORDER — ANASTROZOLE 1 MG PO TABS
1.0000 mg | ORAL_TABLET | Freq: Every day | ORAL | Status: DC
  Administered 2020-07-12 – 2020-07-13 (×2): 1 mg via ORAL
  Filled 2020-07-11 (×5): qty 1

## 2020-07-11 MED ORDER — SODIUM CHLORIDE 0.9 % IV SOLN: INTRAVENOUS | Status: DC

## 2020-07-11 MED ORDER — AMLODIPINE BESYLATE 5 MG PO TABS
5.0000 mg | ORAL_TABLET | Freq: Every day | ORAL | Status: DC
  Administered 2020-07-11 – 2020-07-13 (×3): 5 mg via ORAL
  Filled 2020-07-11 (×3): qty 1

## 2020-07-11 MED ORDER — INSULIN ASPART 100 UNIT/ML ~~LOC~~ SOLN
2.0000 [IU] | Freq: Three times a day (TID) | SUBCUTANEOUS | Status: DC
  Administered 2020-07-11 – 2020-07-13 (×4): 2 [IU] via SUBCUTANEOUS

## 2020-07-11 MED ORDER — APIXABAN 2.5 MG PO TABS
2.5000 mg | ORAL_TABLET | Freq: Two times a day (BID) | ORAL | Status: DC
  Administered 2020-07-11 – 2020-07-13 (×5): 2.5 mg via ORAL
  Filled 2020-07-11 (×5): qty 1

## 2020-07-11 MED ORDER — AMIODARONE HCL 200 MG PO TABS
200.0000 mg | ORAL_TABLET | Freq: Every day | ORAL | Status: DC
  Administered 2020-07-11 – 2020-07-13 (×3): 200 mg via ORAL
  Filled 2020-07-11 (×3): qty 1

## 2020-07-11 MED ORDER — ONDANSETRON HCL 4 MG/2ML IJ SOLN: 4.0000 mg | Freq: Four times a day (QID) | INTRAMUSCULAR | Status: DC | PRN

## 2020-07-11 MED ORDER — LINAGLIPTIN 5 MG PO TABS
5.0000 mg | ORAL_TABLET | Freq: Every day | ORAL | Status: DC
  Administered 2020-07-11 – 2020-07-13 (×3): 5 mg via ORAL
  Filled 2020-07-11 (×5): qty 1

## 2020-07-11 MED ORDER — GLUCERNA SHAKE PO LIQD
237.0000 mL | Freq: Three times a day (TID) | ORAL | Status: DC
  Administered 2020-07-11: 237 mL via ORAL

## 2020-07-11 MED ORDER — ACETAMINOPHEN 650 MG RE SUPP: 650.0000 mg | Freq: Four times a day (QID) | RECTAL | Status: DC | PRN

## 2020-07-11 MED ORDER — ONDANSETRON HCL 4 MG PO TABS: 4.0000 mg | ORAL_TABLET | Freq: Four times a day (QID) | ORAL | Status: DC | PRN

## 2020-07-11 MED ORDER — SIMVASTATIN 20 MG PO TABS
20.0000 mg | ORAL_TABLET | Freq: Every day | ORAL | Status: DC
  Administered 2020-07-11 – 2020-07-13 (×3): 20 mg via ORAL
  Filled 2020-07-11 (×3): qty 1

## 2020-07-11 MED ORDER — INSULIN GLARGINE 100 UNIT/ML ~~LOC~~ SOLN
10.0000 [IU] | SUBCUTANEOUS | Status: DC
  Administered 2020-07-11 – 2020-07-13 (×3): 10 [IU] via SUBCUTANEOUS
  Filled 2020-07-11 (×5): qty 0.1

## 2020-07-11 MED ORDER — INSULIN ASPART 100 UNIT/ML ~~LOC~~ SOLN
0.0000 [IU] | Freq: Three times a day (TID) | SUBCUTANEOUS | Status: DC
  Administered 2020-07-11 – 2020-07-12 (×3): 2 [IU] via SUBCUTANEOUS
  Administered 2020-07-13: 1 [IU] via SUBCUTANEOUS

## 2020-07-11 MED ORDER — BUSPIRONE HCL 5 MG PO TABS
5.0000 mg | ORAL_TABLET | Freq: Two times a day (BID) | ORAL | Status: DC
  Administered 2020-07-11 – 2020-07-13 (×5): 5 mg via ORAL
  Filled 2020-07-11 (×5): qty 1

## 2020-07-11 NOTE — Progress Notes (Addendum)
PROGRESS NOTE   Amy Hoffman  GGE:[id number] DOB: 1940-12-13 DOA: 07/10/2020 PCP: [name], NP   Chief Complaint  Patient presents with  . Leg Swelling    Brief Admission History:  79 y/o female recently discharged for treatment of UTI, has dementia, recently discharged from [facility], presented with poor oral intake, chronic lower extremity edema, failure to thrive, dehydration and acute kidney injury with bilateral pleural effusions L>R.    Assessment & Plan:   Principal Problem:   Failure to thrive in adult Active Problems:   Breast CA (HCC)   Hyperlipidemia, unspecified   Paroxysmal atrial fibrillation (HCC)   Dementia with behavioral disturbance (HCC)   Do not resuscitate status   Type 2 diabetes mellitus (HCC)   AKI (acute kidney injury) (HCC)   Pleural effusion, bilateral   Chronic respiratory failure with hypoxia (HCC)   Anticoagulated   Acute on chronic diastolic CHF (HCC)   Microcytic anemia   Mild protein malnutrition ([place])  1. Adult FTT - Pt unfortunately is not eating and drinking well.  I have asked dietitian for a consultation. She is being rehydrated with IV fluids.  This likely is progression of underlying dementia.  I have asked for a palliative medicine consultation for goals of care discussions with caretakers.  2. Chronic hypoxic respiratory failure - Pt reportedly has not been fully compliant with wearing home oxygen. This has been restarted.  3. AKI - prerenal from poor oral intake, gently hydrating with IV fluids. Follow BMP.  4. Poorly controlled type 2 diabetes mellitus with hyperglycemia - I have ordered SSI, prandial coverage and CBG monitoring.  Ok to continue tradjenta for now.  Follow CBG.  5. Hyperlipidemia - continuing home simvastatin 20 mg daily.  6. 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  7. History of breast cancer - she remains on maintenance therapy  with arimidex.  8. Moderate protein calorie malnutrition - dietitian consultation requested.  9. Anemia of chronic disease - Hg 9.5, will follow.  Check stool for hemoccult.  10. Paroxysmal atrial fibrillation - She remains on home amiodarone 200 mg twice daily plus apixaban 2.5 mg twice daily for anticoagulation.   11. DNR - present on admission.  12. Bilateral pleural effusion - Left greater than right - US thoracentesis requested if possible and send fluid for studies as ordered.     DVT prophylaxis: apixaban  Code Status: DNR  Family Communication: t/c to Denny Peon, no answer 10/28 Disposition:  TBD (pending PT eval)  Status is: Inpatient  Remains inpatient appropriate because:Inpatient level of care appropriate due to severity of illness   Dispo: The patient is from: Home              Anticipated d/c is to: TBD              Anticipated d/c date is: 2 days              Patient currently is not medically stable to d/c. Consultants:   Palliative medicine   Procedures:     Antimicrobials:     Subjective: Pt says she is hungry and wants to eat, otherwise says she feels fine  Objective: Vitals:   07/11/20 0300 07/11/20 0804 07/11/20 1035 07/11/20 1043  BP: (!) 143/59 122/77 130/89 136/79  Pulse: 62 (!) 102 (!) 104 (!) 105  Resp: 12 14 16 16   Temp:  97.6 F (36.4 C) 97.9  F (36.6 C)   TempSrc:  Oral Oral   SpO2: 100%  100% 100%  Weight:      Height:        Intake/Output Summary (Last 24 hours) at 07/11/2020 1050 Last data filed at 07/10/2020 1812 Gross per 24 hour  Intake 500 ml  Output --  Net 500 ml   Filed Weights   07/10/20 1519  Weight: 68 kg    Examination:  General exam: frail elderly female with dementia, cooperative, pleasant, Appears calm and comfortable  Respiratory system: shallow BS bilateral with diminished BS LLL. Respiratory effort normal. Cardiovascular system: normal S1 & S2 heard. No JVD, murmurs, rubs, gallops or clicks.   Gastrointestinal system: Abdomen is nondistended, soft and nontender. No organomegaly or masses felt. Normal bowel sounds heard. Central nervous system: Alert and oriented. No focal neurological deficits. Extremities: 1+ edema BLEs.  Symmetric 5 x 5 power. Skin: No lesions or ulcers Psychiatry:  Mood & affect appropriate.   Data Reviewed: I have personally reviewed following labs and imaging studies  CBC: Recent Labs  Lab 07/09/20 0828 07/10/20 1534 07/11/20 0856  WBC 10.9* 8.7 8.3  NEUTROABS  --  7.7  --   HGB 10.0* 9.3* 9.5*  HCT 32.3* 32.1* 33.2*  MCV 87.3 88.4 90.2  PLT 358 350 287    Basic Metabolic Panel: Recent Labs  Lab 07/09/20 0828 07/10/20 1534 07/11/20 0856  NA 141 136 137  K 4.8 4.2 4.4  CL 102 101 104  CO2 26 26 24   GLUCOSE 237* 260* 190*  BUN 41* 49* 46*  CREATININE 1.38* 1.54* 1.32*  CALCIUM 9.2 8.5* 8.3*  MG  --  2.3  --     GFR: Estimated Creatinine Clearance: 31.1 mL/min (A) (by C-G formula based on SCr of 1.32 mg/dL (H)).  Liver Function Tests: Recent Labs  Lab 07/09/20 0828 07/10/20 1534 07/11/20 0856  AST 28 22 23   ALT 41* 40 38  ALKPHOS  --  98 97  BILITOT 0.7 0.6 0.6  PROT 6.7 6.6 6.5  ALBUMIN  --  3.0* 3.0*    CBG: Recent Labs  Lab 07/04/20 1120 07/10/20 1600 07/11/20 0823  GLUCAP 200* 238* 172*    Recent Results (from the past 240 hour(s))  C Difficile Quick Screen w PCR reflex     Status: None   Collection Time: 07/01/20  6:00 PM   Specimen: STOOL  Result Value Ref Range Status   C Diff antigen NEGATIVE NEGATIVE Final   C Diff toxin NEGATIVE NEGATIVE Final   C Diff interpretation No C. difficile detected.  Final    Comment: Performed at [facility], [address], [place], [place] [address]  Respiratory Panel by RT PCR (Flu A&B, Covid) - Nasopharyngeal Swab     Status: None   Collection Time: 07/10/20  3:15 PM   Specimen: Nasopharyngeal Swab  Result Value Ref Range Status   SARS Coronavirus 2 by RT PCR  NEGATIVE NEGATIVE Final    Comment: (NOTE) SARS-CoV-2 target nucleic acids are NOT DETECTED.  The SARS-CoV-2 RNA is generally detectable in upper respiratoy specimens during the acute phase of infection. The lowest concentration of SARS-CoV-2 viral copies this assay can detect is 131 copies/mL. A negative result does not preclude SARS-Cov-2 infection and should not be used as the sole basis for treatment or other patient management decisions. A negative result may occur with  improper specimen collection/handling, submission of specimen other than nasopharyngeal swab, presence of viral mutation(s) within  the areas targeted by this assay, and inadequate number of viral copies (<131 copies/mL). A negative result must be combined with clinical observations, patient history, and epidemiological information. The expected result is Negative.  Fact Sheet for Patients:  PinkCheek.be  Fact Sheet for Healthcare Providers:  GravelBags.it  This test is no t yet approved or cleared by the [place] FDA and  has been authorized for detection and/or diagnosis of SARS-CoV-2 by FDA under an Emergency Use Authorization (EUA). This EUA will remain  in effect (meaning this test can be used) for the duration of the COVID-19 declaration under Section 564(b)(1) of the Act, 21 U.S.C. section 360bbb-3(b)(1), unless the authorization is terminated or revoked sooner.     Influenza A by PCR NEGATIVE NEGATIVE Final   Influenza B by PCR NEGATIVE NEGATIVE Final    Comment: (NOTE) The Xpert Xpress SARS-CoV-2/FLU/RSV assay is intended as an aid in  the diagnosis of influenza from Nasopharyngeal swab specimens and  should not be used as a sole basis for treatment. Nasal washings and  aspirates are unacceptable for Xpert Xpress SARS-CoV-2/FLU/RSV  testing.  Fact Sheet for Patients: PinkCheek.be  Fact Sheet for  Healthcare Providers: GravelBags.it  This test is not yet approved or cleared by the [place] FDA and  has been authorized for detection and/or diagnosis of SARS-CoV-2 by  FDA under an Emergency Use Authorization (EUA). This EUA will remain  in effect (meaning this test can be used) for the duration of the  Covid-19 declaration under Section 564(b)(1) of the Act, 21  U.S.C. section 360bbb-3(b)(1), unless the authorization is  terminated or revoked. Performed at [facility], [address], [place], [place] [address]      Radiology Studies: DG Chest 2 View  Result Date: 07/10/2020 CLINICAL DATA:  79 year old female with altered mental status. EXAM: CHEST - 2 VIEW COMPARISON:  Chest radiograph dated 06/30/2020. FINDINGS: There are bilateral pleural effusions, left greater right, which have increased in size compared to the prior radiograph. Bibasilar atelectasis or infiltrate, also increased since the prior radiograph. No pneumothorax. Stable cardiomegaly. Atherosclerotic calcification of the aorta. No acute osseous pathology. Surgical clips over the right chest as well as sclerotic lesion of the proximal left humeral diaphysis similar to prior radiograph. IMPRESSION: Bilateral pleural effusions, left greater right, and bibasilar atelectasis or infiltrate, increased since the prior radiograph. Electronically Signed   By: Anner Crete M.D.   On: 07/10/2020 15:51   Scheduled Meds: . amiodarone  200 mg Oral Daily  . amLODipine  5 mg Oral Daily  . anastrozole  1 mg Oral Daily  . apixaban  2.5 mg Oral BID  . busPIRone  5 mg Oral BID  . insulin aspart  0-9 Units Subcutaneous TID WC  . insulin aspart  2 Units Subcutaneous TID WC  . insulin glargine  10 Units Subcutaneous [id number]  . linagliptin  5 mg Oral Daily  . simvastatin  20 mg Oral Daily   Continuous Infusions: . sodium chloride 60 mL/hr at 07/11/20 0826     LOS: 0 days   Time spent: 38 minutes    Eder Macek Wynetta Emery, MD How to contact the [facility] Attending or Consulting provider [place] or covering provider during after hours [place], for this patient?  1. Check the care team in [facility] and look for a) attending/consulting [facility] provider listed and b) the [facility] team listed 2. Log into [email or web address] and use [place]'s universal password to access. If you do not have the  password, please contact the hospital operator. 3. Locate the [facility] provider you are looking for under [facility] and [place] to a number that you can be directly reached. 4. If you still have difficulty reaching the provider, please Sheu the [facility] (Director on Call) for the Hospitalists listed on amion for assistance.  07/11/2020, 10:50 AM

## 2020-07-11 NOTE — Plan of Care (Signed)
Problem: Education: Goal: Knowledge of General Education information will improve Description: Including pain rating scale, medication(s)/side effects and non-pharmacologic comfort measures 07/11/2020 1750 by Cameron Ali, RN Outcome: Progressing 07/11/2020 1530 by Cameron Ali, RN Outcome: Progressing   Problem: Health Behavior/Discharge Planning: Goal: Ability to manage health-related needs will improve 07/11/2020 1750 by Cameron Ali, RN Outcome: Progressing 07/11/2020 1530 by Cameron Ali, RN Outcome: Progressing   Problem: Clinical Measurements: Goal: Ability to maintain clinical measurements within normal limits will improve 07/11/2020 1750 by Cameron Ali, RN Outcome: Progressing 07/11/2020 1530 by Cameron Ali, RN Outcome: Progressing Goal: Will remain free from infection 07/11/2020 1750 by Cameron Ali, RN Outcome: Progressing 07/11/2020 1530 by Cameron Ali, RN Outcome: Progressing Goal: Diagnostic test results will improve 07/11/2020 1750 by Cameron Ali, RN Outcome: Progressing 07/11/2020 1530 by Cameron Ali, RN Outcome: Progressing Goal: Respiratory complications will improve 07/11/2020 1750 by Cameron Ali, RN Outcome: Progressing 07/11/2020 1530 by Cameron Ali, RN Outcome: Progressing Goal: Cardiovascular complication will be avoided 07/11/2020 1750 by Cameron Ali, RN Outcome: Progressing 07/11/2020 1530 by Cameron Ali, RN Outcome: Progressing   Problem: Activity: Goal: Risk for activity intolerance will decrease 07/11/2020 1750 by Cameron Ali, RN Outcome: Progressing 07/11/2020 1530 by Cameron Ali, RN Outcome: Progressing   Problem: Nutrition: Goal: Adequate nutrition will be maintained 07/11/2020 1750 by Cameron Ali, RN Outcome: Progressing 07/11/2020 1530 by Cameron Ali, RN Outcome: Progressing   Problem: Coping: Goal: Level of anxiety will  decrease 07/11/2020 1750 by Cameron Ali, RN Outcome: Progressing 07/11/2020 1530 by Cameron Ali, RN Outcome: Progressing   Problem: Elimination: Goal: Will not experience complications related to bowel motility 07/11/2020 1750 by Cameron Ali, RN Outcome: Progressing 07/11/2020 1530 by Cameron Ali, RN Outcome: Progressing Goal: Will not experience complications related to urinary retention 07/11/2020 1750 by Cameron Ali, RN Outcome: Progressing 07/11/2020 1530 by Cameron Ali, RN Outcome: Progressing   Problem: Pain Managment: Goal: General experience of comfort will improve 07/11/2020 1750 by Cameron Ali, RN Outcome: Progressing 07/11/2020 1530 by Cameron Ali, RN Outcome: Progressing   Problem: Safety: Goal: Ability to remain free from injury will improve 07/11/2020 1750 by Cameron Ali, RN Outcome: Progressing 07/11/2020 1530 by Cameron Ali, RN Outcome: Progressing   Problem: Skin Integrity: Goal: Risk for impaired skin integrity will decrease 07/11/2020 1750 by Cameron Ali, RN Outcome: Progressing 07/11/2020 1530 by Cameron Ali, RN Outcome: Progressing   Problem: Education: Goal: Knowledge of General Education information will improve Description: Including pain rating scale, medication(s)/side effects and non-pharmacologic comfort measures 07/11/2020 1750 by Cameron Ali, RN Outcome: Progressing 07/11/2020 1530 by Cameron Ali, RN Outcome: Progressing   Problem: Health Behavior/Discharge Planning: Goal: Ability to manage health-related needs will improve 07/11/2020 1750 by Cameron Ali, RN Outcome: Progressing 07/11/2020 1530 by Cameron Ali, RN Outcome: Progressing   Problem: Clinical Measurements: Goal: Ability to maintain clinical measurements within normal limits will improve 07/11/2020 1750 by Cameron Ali, RN Outcome: Progressing 07/11/2020 1530 by Cameron Ali, RN Outcome: Progressing Goal: Will remain free from infection 07/11/2020 1750 by Cameron Ali, RN Outcome: Progressing 07/11/2020 1530 by Cameron Ali, RN Outcome: Progressing Goal: Diagnostic test results will improve 07/11/2020 1750 by Cameron Ali, RN Outcome: Progressing 07/11/2020 1530 by Cameron Ali, RN Outcome: Progressing Goal: Respiratory complications will improve 07/11/2020 1750 by  Cameron Ali, RN Outcome: Progressing 07/11/2020 1530 by Cameron Ali, RN Outcome: Progressing Goal: Cardiovascular complication will be avoided 07/11/2020 1750 by Cameron Ali, RN Outcome: Progressing 07/11/2020 1530 by Cameron Ali, RN Outcome: Progressing   Problem: Activity: Goal: Risk for activity intolerance will decrease 07/11/2020 1750 by Cameron Ali, RN Outcome: Progressing 07/11/2020 1530 by Cameron Ali, RN Outcome: Progressing   Problem: Nutrition: Goal: Adequate nutrition will be maintained 07/11/2020 1750 by Cameron Ali, RN Outcome: Progressing 07/11/2020 1530 by Cameron Ali, RN Outcome: Progressing   Problem: Coping: Goal: Level of anxiety will decrease 07/11/2020 1750 by Cameron Ali, RN Outcome: Progressing 07/11/2020 1530 by Cameron Ali, RN Outcome: Progressing   Problem: Elimination: Goal: Will not experience complications related to bowel motility 07/11/2020 1750 by Cameron Ali, RN Outcome: Progressing 07/11/2020 1530 by Cameron Ali, RN Outcome: Progressing Goal: Will not experience complications related to urinary retention 07/11/2020 1750 by Cameron Ali, RN Outcome: Progressing 07/11/2020 1530 by Cameron Ali, RN Outcome: Progressing   Problem: Pain Managment: Goal: General experience of comfort will improve 07/11/2020 1750 by Cameron Ali, RN Outcome: Progressing 07/11/2020 1530 by Cameron Ali, RN Outcome: Progressing    Problem: Safety: Goal: Ability to remain free from injury will improve 07/11/2020 1750 by Cameron Ali, RN Outcome: Progressing 07/11/2020 1530 by Cameron Ali, RN Outcome: Progressing   Problem: Skin Integrity: Goal: Risk for impaired skin integrity will decrease 07/11/2020 1750 by Cameron Ali, RN Outcome: Progressing 07/11/2020 1530 by Cameron Ali, RN Outcome: Progressing

## 2020-07-11 NOTE — Progress Notes (Signed)
Initial Nutrition Assessment  DOCUMENTATION CODES:   Not applicable  INTERVENTION:  Glucerna Shake po TID, each supplement provides 220 kcal and 10 grams of protein (vanilla)  Feeding assistance with meals to encourage po intake  Daily weights  Consider appetite stimulant  Monitor for GOC  NUTRITION DIAGNOSIS:   Inadequate oral intake related to lethargy/confusion as evidenced by meal completion < 50%.    GOAL:   Patient will meet greater than or equal to 90% of their needs    MONITOR:   PO intake, Supplement acceptance, Weight trends, Labs, I & O's  REASON FOR ASSESSMENT:   Malnutrition Screening Tool    ASSESSMENT:  RD working remotely.  79 year old female with history significant of abnormal uterine bleeding, allergic rhinitis, anxiety, depression, dementia, right breast cancer, DM2, HTN, HLD, osteoarthritis who was recently discharged from nursing facility presented with poor po intake, chronic bilateral LE edema, failure to thrive, dehydration with AKI and bilateral pleural effusions.  Pt underwent US guided left thoracentesis this morning - yielded 630 ml fluid.  Patient awake and pleasantly confused this afternoon, unable to obtain any meaningful history. No family present in room. Per notes, she has not been eating or drinking much since returning home from recent 10/17-10/21 hospitalization, noted 25% po intake of lunch tray this afternoon. Patient with mild/moderate fat and muscle depletions and does not meet criteria for malnutrition at this time, however she is at risk for worsening nutritional status given poor oral intake and dementia. Observed opened Glucerna on bedside tray, pt says she thinks she likes to drink them and reports her favorite flavor is vanilla. Will order Glucerna TID. Patient would likely benefit from po encouragement at meal times, will request feeding assistance. Consider trial of appetite stimulant.   Current wt 68.04 kg (149.69 lb)  Per chart, weights have been trending up in the past month, likely secondary to fluid shifts given chronic BLE edema. Unsure of accuracy of recent history, given pt weighed 61.1 kg (134.42 lb) on 10/25, weighed 60.3 kg (132.66 lb) on 10/26, then weights increased ~17 lbs on 10/27. Per flowsheets, weight on 10/27 was a bed wt, unsure of how previous weights were obtained. Will order daily weights to assess trends.   Medications reviewed and include: SSI, Lantus 10 units daily, Tradjenta IVF: NaCl @ 60 ml/hr  Labs: CBGs 154,172,238 x 24 hrs, BUN 46 (H), Cr 1.32 (H) 06/30/20 A1c 7.7  Per notes: -progression of underlying dementia likely -palliative care following, pending GOC discussions with HCPOA (daughter) -Hgb 9.5, check stool for hemoccult -AKI prerenal d/t poor oral intake, gently hydrating with IVF NUTRITION - FOCUSED PHYSICAL EXAM: Mild orbital, upper arm; moderate buccal fat depletion; Mild temple, dorsal hand; moderate clavicle, clavicle and acromion muscle depletion; Non-pitting BLE   Diet Order:   Diet Order            Diet heart healthy/carb modified Room service appropriate? Yes; Fluid consistency: Thin  Diet effective now                 EDUCATION NEEDS:   Not appropriate for education at this time  Skin:  Skin Assessment: Reviewed RN Assessment  Last BM:  pta  Height:   Ht Readings from Last 1 Encounters:  07/10/20 5\' 5"  (1.651 m)    Weight:   Wt Readings from Last 1 Encounters:  07/10/20 68 kg    BMI:  Body mass index is 24.96 kg/m.  Estimated Nutritional Needs:   Kcal:  1700-1900  Protein:  65-80  Fluid:  >/= 1.5 L/day    Lajuan Lines, RD, LDN Clinical Nutrition After Hours/Weekend Pager # in [place]

## 2020-07-11 NOTE — TOC Initial Note (Signed)
Transition of Care [facility]) - Initial/Assessment Note   Patient Details  Name: Amy Hoffman MRN: [id number] Date of Birth: 08-Jun-1941  Transition of Care [facility]) CM/SW Contact:    Sherie Don, LCSW Phone Number: 07/11/2020, 11:28 AM  Clinical Narrative: Patient is a 79 year old female who was admitted for failure to thrive. Patient has a history of breast cancer, hyperlipidemia, atrial fibrillation, dementia with behavioral disturbance, Type 2 diabetes mellitus, AKI, pleural effusion, chronic respiratory failure with hypoxia, and acute on chronic diastolic CHF. Readmission checklist completed due to high readmission score.  CSW attempted to call patient's daughter, Denny Peon, but was unable to reach her so CSW left HIPAA compliant VM. CSW called patient's husband, Sahar Ryback, to complete assessment. Per husband, patient lives at home with him and has a Systems analyst 7 days a week for 12 hours/day. Her current DME includes O2, walker, wheelchair, and 3N1. Husband reported the patient does not like to wear the O2, but the private duty nurse makes sure the patient wears it as needed. Husband reported the patient requires extensive assistance with ADLs. Patient takes her medications as prescribed as the family and nurse crush the patient's medications in applesauce for her. CSW asked about SNF if it is recommended by PT. Husband stated the patient did not like [facility] for SNF in June, but would defer that decision to his daughter.  CSW confirmed with Vaughan Basta with [facility] that patient is active for PT and RN. TOC to follow pending PT evaluation.  Expected Discharge Plan: [place] Barriers to Discharge: Continued Medical Work up  Patient Goals and CMS Choice Patient states their goals for this hospitalization and ongoing recovery are:: Discharge with [facility] if SNF is not recommended CMS Medicare.gov Compare Post Acute Care list provided to:: Patient Represenative (must comment) (Husband,  daughter) Choice offered to / list presented to : Spouse, Adult Children  Expected Discharge Plan and Services Expected Discharge Plan: [place] In-house Referral: Clinical Social Work, Hospice / Palliative Care Discharge Planning Services: NA Post Acute Care Choice: Resumption of Svcs/PTA Provider Living arrangements for the past 2 months: Single Family Home             DME Arranged: N/A DME Agency: NA HH Arranged: RN, PT [place] Agency: [place] Date [place]: 07/11/20 Time [place]: 7564 Representative spoke with at [place]: Vaughan Basta  Prior Living Arrangements/Services Living arrangements for the past 2 months: [place] Lives with:: Spouse Patient language and need for interpreter reviewed:: Yes Do you feel safe going back to the place where you live?: Yes      Need for Family Participation in Patient Care: Yes (Comment) (Patient has dementia) Care giver support system in place?: Yes (comment) Current home services: DME, Home PT, Sitter (O2, walker, wheelchair, 3N1) Criminal Activity/Legal Involvement Pertinent to Current Situation/Hospitalization: No - Comment as needed  Permission Sought/Granted Permission sought to share information with : Facility Sport and exercise psychologist, Family Supports Permission granted to share information with : Yes, Verbal Permission Granted Permission granted to share info w AGENCY: [facility], SNFs Permission granted to share info w Relationship: Husband, daughter  Emotional Assessment Appearance:: Appears stated age Attitude/Demeanor/Rapport: Unable to Assess Affect (typically observed): Unable to Assess Orientation: : Oriented to Place Alcohol / Substance Use: Not Applicable Psych Involvement: No (comment)  Admission diagnosis:  Peripheral edema [R60.9] Failure to thrive in adult [R62.7] Pleural effusion, bilateral [J90] Pleural effusion  on left [J90] AKI (acute kidney injury)  ([place]) [N17.9] Patient Active Problem List   Diagnosis Date Noted  . Mild protein malnutrition ([place]) 07/11/2020  . Failure to thrive in adult 07/10/2020  . Acute on chronic diastolic CHF ([place]) 70/62/3762  . Microcytic anemia 07/10/2020  . Anticoagulated 07/08/2020  . Tachycardia-bradycardia syndrome ([place])   . Acute lower UTI 07/01/2020  . Bradycardia 06/30/2020  . 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 Chronic constipation 04/23/2020  . Pleural effusion, bilateral 03/09/2020  . Chronic respiratory failure with hypoxia ([place]) 03/09/2020  . AKI (acute kidney injury) ([place]) 03/01/2020  . Type 2 diabetes mellitus ([place]) 02/26/2020  . Hypertensive heart disease with chronic diastolic congestive heart failure ([place]) 02/16/2020  . Hyperlipidemia associated with type 2 diabetes mellitus ([place]) 02/16/2020  . 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 Do not resuscitate status   . CHF (congestive heart failure) ([place]) 02/05/2020  . Hypokalemia   . Medicare annual wellness visit, subsequent   . Dementia with behavioral disturbance ([place])   . Paroxysmal atrial fibrillation ([place]) 12/16/2019  . Hyperlipidemia, unspecified   . Allergic rhinitis due to pollen   . Essential hypertension   . Major depressive disorder, single episode, unspecified   . Unspecified asthma, uncomplicated   . Dysphagia 07/10/2019  . Breast CA ([place]) 01/30/2014   PCP:  [name], NP Pharmacy:   [facility], [place] [address] Phone: [phone] Fax: [phone]  [facility] [phone] - [place], [place] AT [place] [address] [place] [address] Phone: [phone] Fax: [phone]  Readmission Risk Interventions Readmission Risk Prevention Plan 07/11/2020 07/01/2020 02/13/2020  Transportation Screening Complete Complete Complete  Medication Review (RN Care Manager)  Complete Complete Complete  PCP or Specialist appointment within 3-5 days of discharge - - Not Complete  HRI or Home Care Consult Complete Complete Not Complete  SW Recovery Care/Counseling Consult Complete Complete Complete  Palliative Care Screening Complete Not Applicable Complete  Skilled Nursing Facility Complete Not Applicable Complete  Some recent data might be hidden

## 2020-07-11 NOTE — Plan of Care (Signed)

## 2020-07-11 NOTE — Procedures (Signed)
PreOperative Dx: LEFT pleural effusion Postoperative Dx: LEFT pleural effusion Procedure:   US guided LEFT thoracentesis Radiologist:  Thornton Papas Anesthesia:  10 ml of 1% lidocaine Specimen:  630 mL of yellow colored fluid EBL:   < 1 ml Complications: None

## 2020-07-11 NOTE — Progress Notes (Signed)
PMT consult received and chart reviewed. Patient with baseline dementia. Documented HCPOA is daughter, Denny Peon. VM left for daughter to arrange [place] discussion today when she is available. Discussed with Dr. Wynetta Emery.   NO CHARGE  Ihor Dow, [place], FNP-C Palliative Medicine Team  Phone: [phone] Fax: [phone]

## 2020-07-11 NOTE — Sedation Documentation (Signed)
PT tolerated left thoracentesis well today and 630 mL clear yellow fluid removed. Post chest xray completed and read by radiologist and pt transported back to inpatient room. No acute distress noted and labs collected and sent for processing.

## 2020-07-12 LAB — BASIC METABOLIC PANEL
Anion gap: 10 (ref 5–15)
BUN: 43 mg/dL — ABNORMAL HIGH (ref 8–23)
CO2: 23 mmol/L (ref 22–32)
Calcium: 8.2 mg/dL — ABNORMAL LOW (ref 8.9–10.3)
Chloride: 105 mmol/L (ref 98–111)
Creatinine, Ser: 1.1 mg/dL — ABNORMAL HIGH (ref 0.44–1.00)
GFR, Estimated: 51 mL/min — ABNORMAL LOW (ref >60)
Glucose, Bld: 124 mg/dL — ABNORMAL HIGH (ref 70–99)
Potassium: 4.2 mmol/L (ref 3.5–5.1)
Sodium: 138 mmol/L (ref 135–145)

## 2020-07-12 LAB — URINE CULTURE: Culture: NO GROWTH

## 2020-07-12 LAB — GLUCOSE, CAPILLARY
Glucose-Capillary: 94 mg/dL (ref 70–99)
Glucose-Capillary: 96 mg/dL (ref 70–99)
Glucose-Capillary: 181 mg/dL — ABNORMAL HIGH (ref 70–99)
Glucose-Capillary: 70 mg/dL (ref 70–99)
Glucose-Capillary: 96 mg/dL (ref 70–99)

## 2020-07-12 LAB — CYTOLOGY - NON PAP

## 2020-07-12 LAB — CBC
HCT: 33.3 % — ABNORMAL LOW (ref 36.0–46.0)
Hemoglobin: 9.6 g/dL — ABNORMAL LOW (ref 12.0–15.0)
MCH: 25.8 pg — ABNORMAL LOW (ref 26.0–34.0)
MCHC: 28.8 g/dL — ABNORMAL LOW (ref 30.0–36.0)
MCV: 89.5 fL (ref 80.0–100.0)
Platelets: 358 10*3/uL (ref 150–400)
RBC: 3.72 MIL/uL — ABNORMAL LOW (ref 3.87–5.11)
RDW: 15.8 % — ABNORMAL HIGH (ref 11.5–15.5)
WBC: 11.7 10*3/uL — ABNORMAL HIGH (ref 4.0–10.5)
nRBC: 0 % (ref 0.0–0.2)

## 2020-07-12 LAB — MAGNESIUM: Magnesium: 2.3 mg/dL (ref 1.7–2.4)

## 2020-07-12 MED ORDER — MEGESTROL ACETATE 400 MG/10ML PO SUSP: 400.0000 mg | Freq: Every day | ORAL | Status: DC

## 2020-07-12 NOTE — Care Management Important Message (Signed)
Important Message  Patient Details  Name: Amy Hoffman MRN: [id number] Date of Birth: 12-09-1940   Medicare Important Message Given:  Yes - Important Message mailed due to current National Emergency     Tommy Medal 07/12/2020, 5:28 PM

## 2020-07-12 NOTE — Progress Notes (Signed)
PROGRESS NOTE   Amy Hoffman  LGX:[id number] DOB: [birth year] DOA: 07/10/2020 PCP: [name], NP   Chief Complaint  Patient presents with  . Leg Swelling   Brief Admission History:  79 y/o female recently discharged for treatment of UTI, has dementia, recently discharged from [facility], presented with poor oral intake, chronic lower extremity edema, failure to thrive, dehydration and acute kidney injury with bilateral pleural effusions L>R.    Assessment & Plan:   Principal Problem:   Failure to thrive in adult Active Problems:   Breast CA (HCC)   Hyperlipidemia, unspecified   Paroxysmal atrial fibrillation (HCC)   Dementia with behavioral disturbance (HCC)   Do not resuscitate status   Type 2 diabetes mellitus (HCC)   AKI (acute kidney injury) (HCC)   Pleural effusion, bilateral   Chronic respiratory failure with hypoxia (HCC)   Anticoagulated   Acute on chronic diastolic CHF (HCC)   Microcytic anemia   Mild protein malnutrition ([place])  1. Adult FTT - Pt unfortunately is not eating and drinking well.  I have asked dietitian for a consultation. She is being rehydrated with IV fluids.  This likely is progression of underlying dementia.  I have asked for a palliative medicine consultation for goals of care discussions with caretakers.  Daughter would like to receive outpatient palliative medicine services.   2. Chronic hypoxic respiratory failure - Pt reportedly has not been fully compliant with wearing home oxygen. This has been restarted.  3. AKI - prerenal from poor oral intake, gently hydrating with IV fluids. Follow BMP.  Creatinine is improving.  4. Poorly controlled type 2 diabetes mellitus with hyperglycemia - I have ordered SSI, prandial coverage and CBG monitoring.  Ok to continue tradjenta for now.  Follow CBG.  Family requested outpatient endocrinology referral.  5. Hyperlipidemia - continuing home simvastatin 20 mg daily.  6. Dementia - continue home meds,  aricept was discontinued due to cardiac issues with bradycardia and tachybrady syndrome.  7. History of breast cancer - she remains on maintenance therapy with arimidex.  8. Moderate protein calorie malnutrition - dietitian consultation requested.  9. Anemia of chronic disease - Hg 9.5, will follow.  Check stool for hemoccult.  10. Paroxysmal atrial fibrillation - She remains on home amiodarone 200 mg twice daily plus apixaban 2.5 mg twice daily for anticoagulation.  Outpatient cardiology follow up.   11. DNR - present on admission.  12. Bilateral pleural effusion - Left greater than right - US thoracentesis completed and 650 cc removed.  fluid sent for studies but consistent with pleural fluid.      DVT prophylaxis: apixaban  Code Status: DNR  Family Communication: t/c to Denny Peon, 10/29 Disposition:  Home with [place] (family declined SNF)  Status is: Inpatient  Remains inpatient appropriate because:Inpatient level of care appropriate due to severity of illness  Dispo: The patient is from: Home              Anticipated d/c is to: Home (family declined SNF)              Anticipated d/c date is: 1 day              Patient currently is not medically stable to d/c. Consultants:   Palliative medicine   Procedures:     Antimicrobials:     Subjective: Pt without complaint today.  She is eating good at times and not good at times.    Objective: Vitals:   07/11/20  2042 07/11/20 2124 07/12/20 0652 07/12/20 1500  BP:  126/75 (!) 108/91 138/69  Pulse:  100 (!) 53 83  Resp:  19 19 20   Temp:  98.1 F (36.7 C) 97.8 F (36.6 C) 98.3 F (36.8 C)  TempSrc:  Oral Oral Axillary  SpO2: 98% 100% 100% 98%  Weight:      Height:        Intake/Output Summary (Last 24 hours) at 07/12/2020 1715 Last data filed at 07/12/2020 1000 Gross per 24 hour  Intake 240 ml  Output --  Net 240 ml   Filed Weights   07/10/20 1519  Weight: 68 kg    Examination:  General exam: frail elderly  female with dementia, cooperative, pleasant, Appears calm and comfortable  Respiratory system: shallow BS bilateral with diminished BS LLL. Respiratory effort normal. Cardiovascular system: normal S1 & S2 heard. No JVD, murmurs, rubs, gallops or clicks.  Gastrointestinal system: Abdomen is nondistended, soft and nontender. No organomegaly or masses felt. Normal bowel sounds heard. Central nervous system: Alert and oriented. No focal neurological deficits. Extremities: 1+ edema BLEs.  Symmetric 5 x 5 power. Skin: No lesions or ulcers Psychiatry:  Mood & affect appropriate.   Data Reviewed: I have personally reviewed following labs and imaging studies  CBC: Recent Labs  Lab 07/09/20 0828 07/10/20 1534 07/11/20 0856 07/12/20 0441  WBC 10.9* 8.7 8.3 11.7*  NEUTROABS  --  7.7  --   --   HGB 10.0* 9.3* 9.5* 9.6*  HCT 32.3* 32.1* 33.2* 33.3*  MCV 87.3 88.4 90.2 89.5  PLT 358 350 326 361    Basic Metabolic Panel: Recent Labs  Lab 07/09/20 0828 07/10/20 1534 07/11/20 0856 07/12/20 0441  NA 141 136 137 138  K 4.8 4.2 4.4 4.2  CL 102 101 104 105  CO2 26 26 24 23   GLUCOSE 237* 260* 190* 124*  BUN 41* 49* 46* 43*  CREATININE 1.38* 1.54* 1.32* 1.10*  CALCIUM 9.2 8.5* 8.3* 8.2*  MG  --  2.3  --  2.3    GFR: Estimated Creatinine Clearance: 37.3 mL/min (A) (by C-G formula based on SCr of 1.1 mg/dL (H)).  Liver Function Tests: Recent Labs  Lab 07/09/20 0828 07/10/20 1534 07/11/20 0856  AST 28 22 23   ALT 41* 40 38  ALKPHOS  --  98 97  BILITOT 0.7 0.6 0.6  PROT 6.7 6.6 6.5  ALBUMIN  --  3.0* 3.0*    CBG: Recent Labs  Lab 07/11/20 1144 07/11/20 1624 07/12/20 0641 07/12/20 0807 07/12/20 1209  GLUCAP 154* 104* 94 96 181*    Recent Results (from the past 240 hour(s))  Respiratory Panel by RT PCR (Flu A&B, Covid) - Nasopharyngeal Swab     Status: None   Collection Time: 07/10/20  3:15 PM   Specimen: Nasopharyngeal Swab  Result Value Ref Range Status   SARS  Coronavirus 2 by RT PCR NEGATIVE NEGATIVE Final    Comment: (NOTE) SARS-CoV-2 target nucleic acids are NOT DETECTED.  The SARS-CoV-2 RNA is generally detectable in upper respiratoy specimens during the acute phase of infection. The lowest concentration of SARS-CoV-2 viral copies this assay can detect is 131 copies/mL. A negative result does not preclude SARS-Cov-2 infection and should not be used as the sole basis for treatment or other patient management decisions. A negative result may occur with  improper specimen collection/handling, submission of specimen other than nasopharyngeal swab, presence of viral mutation(s) within the areas targeted by this assay, and inadequate number  of viral copies (<131 copies/mL). A negative result must be combined with clinical observations, patient history, and epidemiological information. The expected result is Negative.  Fact Sheet for Patients:  PinkCheek.be  Fact Sheet for Healthcare Providers:  GravelBags.it  This test is no t yet approved or cleared by the [place] FDA and  has been authorized for detection and/or diagnosis of SARS-CoV-2 by FDA under an Emergency Use Authorization (EUA). This EUA will remain  in effect (meaning this test can be used) for the duration of the COVID-19 declaration under Section 564(b)(1) of the Act, 21 U.S.C. section 360bbb-3(b)(1), unless the authorization is terminated or revoked sooner.     Influenza A by PCR NEGATIVE NEGATIVE Final   Influenza B by PCR NEGATIVE NEGATIVE Final    Comment: (NOTE) The Xpert Xpress SARS-CoV-2/FLU/RSV assay is intended as an aid in  the diagnosis of influenza from Nasopharyngeal swab specimens and  should not be used as a sole basis for treatment. Nasal washings and  aspirates are unacceptable for Xpert Xpress SARS-CoV-2/FLU/RSV  testing.  Fact Sheet for  Patients: PinkCheek.be  Fact Sheet for Healthcare Providers: GravelBags.it  This test is not yet approved or cleared by the [place] FDA and  has been authorized for detection and/or diagnosis of SARS-CoV-2 by  FDA under an Emergency Use Authorization (EUA). This EUA will remain  in effect (meaning this test can be used) for the duration of the  Covid-19 declaration under Section 564(b)(1) of the Act, 21  U.S.C. section 360bbb-3(b)(1), unless the authorization is  terminated or revoked. Performed at [facility], [address], [place], [place] [address]   Urine Culture     Status: None   Collection Time: 07/10/20  7:15 PM   Specimen: Urine, Clean Catch  Result Value Ref Range Status   Specimen Description   Final    URINE, CLEAN CATCH Performed at [facility], [address], [place], [place] [address]    Special Requests   Final    NONE Performed at [facility], [address], [place], [place] [address]    Culture   Final    NO GROWTH Performed at [facility], [place] [address], [place], [place] [address]    Report Status 07/12/2020 FINAL  Final  Gram stain     Status: None   Collection Time: 07/11/20 11:06 AM   Specimen: Pleura; Body Fluid  Result Value Ref Range Status   Specimen Description PLEURAL  Final   Special Requests NONE  Final   Gram Stain   Final    NO ORGANISMS SEEN WBC PRESENT, PREDOMINANTLY MONONUCLEAR Performed at [facility], [address], [place], [place] [address]    Report Status 07/11/2020 FINAL  Final     Radiology Studies: DG Chest 1 View  Result Date: 07/11/2020 CLINICAL DATA:  LEFT pleural effusion post thoracentesis EXAM: CHEST  1 VIEW COMPARISON:  07/10/2020 FINDINGS: Enlargement of cardiac silhouette with pulmonary vascular congestion. Atherosclerotic calcification aorta. Small bibasilar pleural effusions and atelectasis. Improved aeration and decreased pleural  effusions/atelectasis at LEFT base since prior exam. No pneumothorax. Bones demineralized. IMPRESSION: No pneumothorax following LEFT thoracentesis. Electronically Signed   By: Lavonia Dana M.D.   On: 07/11/2020 11:36   US THORACENTESIS ASP PLEURAL SPACE W/IMG GUIDE  Result Date: 07/11/2020 INDICATION: LEFT pleural effusion EXAM: ULTRASOUND GUIDED DIAGNOSTIC AND THERAPEUTIC THORACENTESIS MEDICATIONS: None COMPLICATIONS: None immediate PROCEDURE: An ultrasound guided thoracentesis was thoroughly discussed with the patient and questions answered. The benefits,  risks, alternatives and complications were also discussed. The patient understands and wishes to proceed with the procedure. Written consent was obtained. Ultrasound was performed to localize and mark an adequate pocket of fluid in the LEFT chest. The area was then prepped and draped in the normal sterile fashion. 1% Lidocaine was used for local anesthesia. Under ultrasound guidance a 8 French thoracentesis catheter was introduced. Thoracentesis was performed. The catheter was removed and a dressing applied. FINDINGS: A total of approximately 630 mL of yellow LEFT pleural fluid was removed. Samples were sent to the laboratory as requested by the clinical team. IMPRESSION: Successful ultrasound guided LEFT thoracentesis yielding 630 mL of pleural fluid. Electronically Signed   By: Lavonia Dana M.D.   On: 07/11/2020 11:35   Scheduled Meds: . amiodarone  200 mg Oral Daily  . amLODipine  5 mg Oral Daily  . anastrozole  1 mg Oral Daily  . apixaban  2.5 mg Oral BID  . busPIRone  5 mg Oral BID  . feeding supplement (GLUCERNA SHAKE)  237 mL Oral TID BM  . insulin aspart  0-9 Units Subcutaneous TID WC  . insulin aspart  2 Units Subcutaneous TID WC  . insulin glargine  10 Units Subcutaneous [id number]  . linagliptin  5 mg Oral Daily  . simvastatin  20 mg Oral Daily   Continuous Infusions: . sodium chloride 60 mL/hr at 07/11/20 0826    LOS: 1 day   Time  spent: 33 minutes   Adhira Jamil Wynetta Emery, MD How to contact the [facility] Attending or Consulting provider [place] or covering provider during after hours [place], for this patient?  1. Check the care team in [facility] and look for a) attending/consulting TRH provider listed and b) the [facility] team listed 2. Log into [email or web address] and use [place]'s universal password to access. If you do not have the password, please contact the hospital operator. 3. Locate the [facility] provider you are looking for under [facility] and [place] to a number that you can be directly reached. 4. If you still have difficulty reaching the provider, please Bi the [facility] (Director on Call) for the Hospitalists listed on amion for assistance.  07/12/2020, 5:15 PM

## 2020-07-12 NOTE — Plan of Care (Signed)
  Problem: Acute Rehab PT Goals(only PT should resolve) Goal: Pt Will Go Supine/Side To Sit Outcome: Progressing Flowsheets (Taken 07/12/2020 1125) Pt will go Supine/Side to Sit:  with min guard assist  with minimal assist Goal: Patient Will Transfer Sit To/From Stand Outcome: Progressing Flowsheets (Taken 07/12/2020 1125) Patient will transfer sit to/from stand: with minimal assist Goal: Pt Will Transfer Bed To Chair/Chair To Bed Outcome: Progressing Flowsheets (Taken 07/12/2020 1125) Pt will Transfer Bed to Chair/Chair to Bed: with min assist Goal: Pt Will Ambulate Outcome: Progressing Flowsheets (Taken 07/12/2020 1125) Pt will Ambulate:  25 feet  with minimal assist  with rolling walker   11:26 AM, 07/12/20 Lonell Grandchild, MPT Physical Therapist with [facility] [phone] office [phone] mobile phone

## 2020-07-12 NOTE — [place] FL2 (Signed)
[place] MEDICAID FL2 LEVEL OF CARE SCREENING TOOL     IDENTIFICATION  Patient Name: Amy Hoffman Birthdate: [birth year] Sex: female Admission Date (Current Location): 07/10/2020  [facility] and [place] Number:  [employer] and Address:  [facility] [address] [place]      Provider Number: [id number]  Attending Physician Name and Address:  Murlean Iba, MD  Relative Name and Phone Number:  Denny Peon - daughter [phone]    Current Level of Care: Hospital Recommended Level of Care: [place] Prior Approval Number:    Date Approved/Denied:   PASRR Number:    Discharge Plan: SNF    Current Diagnoses: Patient Active Problem List   Diagnosis Date Noted  . Mild protein malnutrition ([facility] [place]) 07/11/2020  . Failure to thrive in adult 07/10/2020  . Acute on chronic diastolic CHF ([place]) 00/17/4944  . Microcytic anemia 07/10/2020  . Anticoagulated 07/08/2020  . Tachycardia-bradycardia syndrome ([place])   . Acute lower UTI 07/01/2020  . Bradycardia 06/30/2020  . 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 Chronic constipation 04/23/2020  . Pleural effusion, bilateral 03/09/2020  . Chronic respiratory failure with hypoxia ([place]) 03/09/2020  . AKI (acute kidney injury) ([place]) 03/01/2020  . Type 2 diabetes mellitus ([place]) 02/26/2020  . Hypertensive heart disease with chronic diastolic congestive heart failure ([place]) 02/16/2020  . Hyperlipidemia associated with type 2 diabetes mellitus ([place]) 02/16/2020  . 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 Do not resuscitate status   . CHF (congestive heart failure) ([place]) 02/05/2020  . Hypokalemia   . Medicare annual wellness visit, subsequent   . Dementia with behavioral disturbance ([place])   . Paroxysmal atrial fibrillation ([place]) 12/16/2019  . Hyperlipidemia, unspecified   . Allergic rhinitis due to pollen   . Essential hypertension   . Major  depressive disorder, single episode, unspecified   . Unspecified asthma, uncomplicated   . Dysphagia 07/10/2019  . Breast CA ([place]) 01/30/2014    Orientation RESPIRATION BLADDER Height & Weight     Self, Place  O2 (3L) External catheter Weight: 68 kg Height:  5\' 5"  (165.1 cm)  BEHAVIORAL SYMPTOMS/MOOD NEUROLOGICAL BOWEL NUTRITION STATUS      Continent Diet (DC summary)  AMBULATORY STATUS COMMUNICATION OF NEEDS Skin   Extensive Assist Verbally Bruising (generalized)                       Personal Care Assistance Level of Assistance  Bathing, Feeding, Dressing Bathing Assistance: Maximum assistance Feeding assistance: Limited assistance Dressing Assistance: Limited assistance     Functional Limitations Info  Sight, Hearing, Speech Sight Info: Adequate Hearing Info: Adequate Speech Info: Adequate    SPECIAL CARE FACTORS FREQUENCY  PT (By licensed PT)     PT Frequency: 5 times a week              Contractures Contractures Info: Not present    Additional Factors Info  Code Status, Allergies Code Status Info: DNR Allergies Info: codeine, zantac           Current Medications (07/12/2020):  This is the current hospital active medication list Current Facility-Administered Medications  Medication Dose Route Frequency Provider Last Rate Last Admin  . 0.9 %  sodium chloride infusion   Intravenous Continuous Irwin Brakeman L, MD 60 mL/hr at 07/11/20 0826 New Bag at 07/11/20 0826  . acetaminophen (TYLENOL) tablet 650 mg  650 mg Oral Q6H PRN Reubin Milan, MD  Or  . acetaminophen (TYLENOL) suppository 650 mg  650 mg Rectal Q6H PRN Reubin Milan, MD      . amiodarone (PACERONE) tablet 200 mg  200 mg Oral Daily Reubin Milan, MD   200 mg at 07/12/20 0910  . amLODipine (NORVASC) tablet 5 mg  5 mg Oral Daily Reubin Milan, MD   5 mg at 07/12/20 0910  . anastrozole (ARIMIDEX) tablet 1 mg  1 mg Oral Daily Reubin Milan, MD   1 mg at  07/12/20 1025  . apixaban (ELIQUIS) tablet 2.5 mg  2.5 mg Oral BID Reubin Milan, MD   2.5 mg at 07/12/20 8527  . busPIRone (BUSPAR) tablet 5 mg  5 mg Oral BID Reubin Milan, MD   5 mg at 07/12/20 0910  . feeding supplement (GLUCERNA SHAKE) (GLUCERNA SHAKE) liquid 237 mL  237 mL Oral TID BM Johnson, Clanford L, MD   237 mL at 07/11/20 2123  . insulin aspart (novoLOG) injection 0-9 Units  0-9 Units Subcutaneous TID WC Reubin Milan, MD   2 Units at 07/12/20 1212  . insulin aspart (novoLOG) injection 2 Units  2 Units Subcutaneous TID WC Johnson, Clanford L, MD   2 Units at 07/12/20 1213  . insulin glargine (LANTUS) injection 10 Units  10 Units Subcutaneous Marvia Pickles, Clanford L, MD   10 Units at 07/12/20 [phone]  . linagliptin (TRADJENTA) tablet 5 mg  5 mg Oral Daily Reubin Milan, MD   5 mg at 07/12/20 0910  . ondansetron (ZOFRAN) tablet 4 mg  4 mg Oral Q6H PRN Reubin Milan, MD       Or  . ondansetron [facility]) injection 4 mg  4 mg Intravenous Q6H PRN Reubin Milan, MD      . simvastatin (ZOCOR) tablet 20 mg  20 mg Oral Daily Reubin Milan, MD   20 mg at 07/12/20 2353     Discharge Medications: Please see discharge summary for a list of discharge medications.  Relevant Imaging Results:  Relevant Lab Results:   Additional Information [id number]  Boneta Lucks, RN

## 2020-07-12 NOTE — Plan of Care (Signed)

## 2020-07-12 NOTE — TOC Progression Note (Signed)
Transition of Care [facility]) - Progression Note    Patient Details  Name: Amy Hoffman MRN: [id number] Date of Birth: 03/19/1941  [facility] CM/SW Contact  Boneta Lucks, RN Phone Number: 07/12/2020, 1:26 PM  Clinical Narrative:   PT recommending SNF,  Daughter [name] agreed with SNF. Choices given ,[facility] is first choice. FL2 completed and sent out. [facility] does not have any beds.  [name] called back to say she had been review other facilities and if [facility] does not have a bed they will take her home with [facility]. Patient is active with [facility].  MD updated    Expected Discharge Plan: [place] Barriers to Discharge: Continued Medical Work up  Expected Discharge Plan and Services Expected Discharge Plan: [place] In-house Referral: Clinical Social Work, Hospice / Palliative Care Discharge Planning Services: NA Post Acute Care Choice: Resumption of Svcs/PTA Provider Living arrangements for the past 2 months: Single Family Home                 DME Arranged: N/A DME Agency: NA       HH Arranged: RN, PT [place] Agency: [place] ([place]) Date [place]: 07/11/20 [place]: 1124 Representative spoke with at [place]: [place]  Readmission Risk Interventions Readmission Risk Prevention Plan 07/11/2020 07/01/2020 02/13/2020  Transportation Screening Complete Complete Complete  Medication Review Press photographer) Complete Complete Complete  PCP or Specialist appointment within 3-5 days of discharge - - Not Complete  HRI or Home Care Consult Complete Complete Not Complete  SW Recovery Care/Counseling Consult Complete Complete Complete  Palliative Care Screening Complete Not Applicable Complete  Skilled Nursing Facility Complete Not Applicable Complete  Some recent data might be hidden

## 2020-07-12 NOTE — Evaluation (Signed)
Physical Therapy Evaluation Patient Details Name: Amy Hoffman MRN: [id number] DOB: [birth year] Today's Date: 07/12/2020   History of Present Illness  Amy Hoffman is a 79 y.o. female with medical history significant of abnormal uterine bleeding, allergic rhinitis, anxiety, depression, dementia, right breast cancer, type 2 diabetes mellitus, hypertension, hyperlipidemia, obesity, GERD, osteoarthritis who was recently admitted from October 17 through October 21, who today is brought to the emergency department due to bilateral lower extremities.  Her primary care provider ordered labs on her yesterday, which show worsening renal function and her family was asked to bring the patient to the emergency department for further evaluation.  Patient was confused and unable to provide further information.    Clinical Impression  Patient appears confused but cooperative with much verbal/tactile cueing.  Patient demonstrates slow labored movement for sitting up at bedside, limited to few unsteady labored steps at bedside due to fall risk and fatigue and tolerated sitting up in chair after therapy - RN/NT notified.  Patient will benefit from continued physical therapy in hospital and recommended venue below to increase strength, balance, endurance for safe ADLs and gait.     Follow Up Recommendations SNF;Supervision for mobility/OOB;Supervision/Assistance - 24 hour    Equipment Recommendations  None recommended by PT    Recommendations for Other Services       Precautions / Restrictions Precautions Precautions: Fall Restrictions Weight Bearing Restrictions: No      Mobility  Bed Mobility Overal bed mobility: Needs Assistance Bed Mobility: Supine to Sit     Supine to sit: Min assist;Mod assist     General bed mobility comments: increased time, labored movement    Transfers Overall transfer level: Needs assistance Equipment used: Rolling walker (2 wheeled) Transfers: Sit to/from  [facility] Sit to Stand: Mod assist Stand pivot transfers: Mod assist       General transfer comment: slow labored movement  Ambulation/Gait Ambulation/Gait assistance: Mod assist;Max assist Gait Distance (Feet): 4 Feet Assistive device: Rolling walker (2 wheeled) Gait Pattern/deviations: Decreased step length - right;Decreased step length - left;Decreased stride length Gait velocity: decreased   General Gait Details: limited to 3-4 slow labored steps at bedside due to fall risk and c/o fatigue  Stairs            Wheelchair Mobility    Modified Rankin (Stroke Patients Only)       Balance Overall balance assessment: Needs assistance Sitting-balance support: Feet supported;No upper extremity supported Sitting balance-Leahy Scale: Fair Sitting balance - Comments: fair/good seated at EOB   Standing balance support: During functional activity;Bilateral upper extremity supported Standing balance-Leahy Scale: Poor Standing balance comment: fair/poor using RW                             Pertinent Vitals/Pain Pain Assessment: No/denies pain    Home Living Family/patient expects to be discharged to:: Private residence Living Arrangements: Spouse/significant other Available Help at Discharge: Family;Available PRN/intermittently Type of Home: House Home Access: Ramped entrance     Home Layout: Two level;Able to live on main level with bedroom/bathroom Home Equipment: Kasandra Knudsen - single point;Wheelchair - [employer];Shower seat;Walker - 2 wheels      Prior Function Level of Independence: Independent with assistive device(s)         Comments: household ambulator using RW     Hand Dominance        Extremity/Trunk Assessment   Upper Extremity Assessment Upper Extremity Assessment: Generalized  weakness    Lower Extremity Assessment Lower Extremity Assessment: Generalized weakness    Cervical / Trunk  Assessment Cervical / Trunk Assessment: Normal  Communication   Communication: HOH (appears confused)  Cognition Arousal/Alertness: Awake/alert Behavior During Therapy: Anxious;Flat affect Overall Cognitive Status: History of cognitive impairments - at baseline                                        General Comments      Exercises     Assessment/Plan    PT Assessment Patient needs continued PT services  PT Problem List Decreased strength;Decreased activity tolerance;Decreased balance;Decreased mobility       PT Treatment Interventions Balance training;DME instruction;Gait training;Stair training;Functional mobility training;Therapeutic activities;Therapeutic exercise;Patient/family education    PT Goals (Current goals can be found in the Care Plan section)  Acute Rehab PT Goals Patient Stated Goal: return home with family to assist PT Goal Formulation: With patient Time For Goal Achievement: 07/26/20 Potential to Achieve Goals: Good    Frequency Min 3X/week   Barriers to discharge        Co-evaluation               AM-PAC PT "6 Clicks" Mobility  Outcome Measure Help needed turning from your back to your side while in a flat bed without using bedrails?: A Little Help needed moving from lying on your back to sitting on the side of a flat bed without using bedrails?: A Lot Help needed moving to and from a bed to a chair (including a wheelchair)?: A Lot Help needed standing up from a chair using your arms (e.g., wheelchair or bedside chair)?: A Lot Help needed to walk in hospital room?: A Lot Help needed climbing 3-5 steps with a railing? : Total 6 Click Score: 12    End of Session Equipment Utilized During Treatment: Oxygen Activity Tolerance: Patient tolerated treatment well;Patient limited by fatigue Patient left: in chair;with call bell/phone within reach;with chair alarm set Nurse Communication: Mobility status PT Visit Diagnosis: Other  abnormalities of gait and mobility (R26.89);Unsteadiness on feet (R26.81);Muscle weakness (generalized) (M62.81)    Time: 0071-2197 PT Time Calculation (min) (ACUTE ONLY): 24 min   Charges:   PT Evaluation $PT Eval Moderate Complexity: 1 Mod PT Treatments $Therapeutic Activity: 23-37 mins        11:24 AM, 07/12/20 Lonell Grandchild, MPT Physical Therapist with [facility] 336 [phone] office [phone] mobile phone

## 2020-07-13 DIAGNOSIS — J90 Pleural effusion, not elsewhere classified: Secondary | ICD-10-CM

## 2020-07-13 DIAGNOSIS — Z7901 Long term (current) use of anticoagulants: Secondary | ICD-10-CM

## 2020-07-13 LAB — BASIC METABOLIC PANEL
Anion gap: 9 (ref 5–15)
BUN: 50 mg/dL — ABNORMAL HIGH (ref 8–23)
CO2: 24 mmol/L (ref 22–32)
Calcium: 8.5 mg/dL — ABNORMAL LOW (ref 8.9–10.3)
Chloride: 106 mmol/L (ref 98–111)
Creatinine, Ser: 1.37 mg/dL — ABNORMAL HIGH (ref 0.44–1.00)
GFR, Estimated: 39 mL/min — ABNORMAL LOW (ref >60)
Glucose, Bld: 88 mg/dL (ref 70–99)
Potassium: 5.1 mmol/L (ref 3.5–5.1)
Sodium: 139 mmol/L (ref 135–145)

## 2020-07-13 LAB — GLUCOSE, CAPILLARY
Glucose-Capillary: 91 mg/dL (ref 70–99)
Glucose-Capillary: 62 mg/dL — ABNORMAL LOW (ref 70–99)
Glucose-Capillary: 163 mg/dL — ABNORMAL HIGH (ref 70–99)
Glucose-Capillary: 148 mg/dL — ABNORMAL HIGH (ref 70–99)
Glucose-Capillary: 34 mg/dL — CL (ref 70–99)
Glucose-Capillary: 54 mg/dL — ABNORMAL LOW (ref 70–99)
Glucose-Capillary: 86 mg/dL (ref 70–99)

## 2020-07-13 MED ORDER — GLUCERNA SHAKE PO LIQD: 237.0000 mL | Freq: Three times a day (TID) | ORAL | 0 refills | Status: AC

## 2020-07-13 NOTE — Progress Notes (Signed)
Patient initial BG check was 36, repeat 54, and final check is 86. Patient ate peanutbutter crackers, a Glucerna shake and grape juice.

## 2020-07-13 NOTE — Plan of Care (Signed)

## 2020-07-13 NOTE — Discharge Instructions (Signed)
NO CONCENTRATED SWEETS OR FRUIT  JUICES EXCEPT TO TREAT A LOW BLOOD SUGAR    Thoracentesis, Care After This sheet gives you information about how to care for yourself after your procedure. Your health care provider may also give you more specific instructions. If you have problems or questions, contact your health care provider. What can I expect after the procedure? After your procedure, it is common to have some pain at the site where the needle was inserted (puncture site). Follow these instructions at home:  Care of the puncture site  Follow instructions from your health care provider about how to take care of your puncture site. Make sure you: ? Wash your hands with soap and water before you change your bandage (dressing). If soap and water are not available, use hand sanitizer. ? Change your dressing as told by your health care provider.  Check the puncture site every day for signs of infection. Check for: ? Redness, swelling, or pain. ? Fluid or blood. ? Warmth. ? Pus or a bad smell.  Do not take baths, swim, or use a hot tub until your health care provider approves. General instructions  Take over-the-counter and prescription medicines only as told by your health care provider.  Do not drive for 24 hours if you were given a medicine to help you relax (sedative) during your procedure.  Drink enough fluid to keep your urine pale yellow.  You may return to your normal diet and normal activities as told by your health care provider.  Keep all follow-up visits as told by your health care provider. This is important. Contact a health care provider if you:  Have redness, swelling, or pain at your puncture site.  Have fluid or blood coming from your puncture site.  Notice that your puncture site feels warm to the touch.  Have pus or a bad smell coming from your puncture site.  Have a fever.  Have chills.  Have nausea or vomiting.  Have trouble breathing.  Develop a  worsening cough. Get help right away if you:  Have extreme shortness of breath.  Develop chest pain.  Faint or feel light-headed. Summary  After your procedure, it is common to have some pain at the site where the needle was inserted (puncture site).  Wash your hands with soap and water before you change your bandage (dressing).  Check your puncture site every day for signs of infection.  Take over-the-counter and prescription medicines only as told by your health care provider. This information is not intended to replace advice given to you by your health care provider. Make sure you discuss any questions you have with your health care provider. Document Revised: 08/13/2017 Document Reviewed: 07/26/2017 Elsevier Patient Education  [place].     Dementia Dementia is a condition that affects the way the brain works. It often affects memory and thinking. There are many types of dementia. Some types get worse with time and cannot be reversed. Some types of dementia include:  Alzheimer's disease. This is the most common type.  Vascular dementia. This type may happen due to a stroke.  Lewy body dementia. This type may happen to people who have Parkinson's disease.  Frontotemporal dementia. This type is caused by damage to nerve cells in certain parts of the brain. Some people may have more than one type, and this is called mixed dementia. What are the causes? This condition is caused by damage to cells in the brain. Some causes that cannot  be reversed include:  Having a condition that affects the blood vessels of the brain, such as diabetes, heart disease, or blood vessel disease.  Changes to genes. Some causes that can be reversed or slowed include:  Injury to the brain.  Certain medicines.  Infection.  Not having enough vitamin B12 in the body, or thyroid problems.  A tumor or blood clot in the brain. What are the signs or symptoms? Symptoms depend on the  type of dementia. This may include:  Problems remembering things.  Having trouble taking a bath or putting clothes on.  Forgetting appointments.  Forgetting to pay bills.  Trouble planning and making meals.  Having trouble speaking.  Getting lost easily. How is this treated? Treatment depends on the cause of the dementia. It might include taking medicines that help:  To control the dementia.  To slow down the dementia.  To manage symptoms. In some cases, treating the cause of your dementia can improve symptoms, reverse symptoms, or slow down how quickly it gets worse. Your doctor can help you find support groups and other doctors who can help with your care. Follow these instructions at home: Medicines  Take over-the-counter and prescription medicines only as told by your doctor.  Use a pill organizer to help you manage your medicines.  Avoidtaking medicines for pain or for sleep. Lifestyle  Make healthy choices: ? Be active as told by your doctor. ? Do not use any products that contain nicotine or tobacco, such as cigarettes, e-cigarettes, and chewing tobacco. If you need help quitting, ask your doctor. ? Do not drink alcohol. ? When you get stressed, do something that will help you to relax. Your doctor can give you tips. ? Spend time with other people.  Make sure you get good sleep. To get good sleep: ? Try not to take naps during the day. ? Keep your bedroom dark and cool. ? In the few hours before you go to bed, try not to do any exercise. ? Do not have foods and drinks with caffeine at night. Eating and drinking  Drink enough fluid to keep your pee (urine) pale yellow.  Eat a healthy diet. General instructions   Talk with your doctor to figure out: ? What you need help with. ? What your safety needs are.  Ask your doctor if it is safe for you to drive.  If told, wear a bracelet that tracks where you are or shows that you are a person with memory  loss.  Work with your family to make big decisions.  Keep all follow-up visits as told by your doctor. This is important. Contact a doctor if:  You have any new symptoms.  Your symptoms get worse.  You have problems with swallowing or choking. Get help right away if:  You feel very sad, or feel that you want to harm yourself.  You or your family members are worried for your safety. If you ever feel like you may hurt yourself or others, or have thoughts about taking your own life, get help right away. You can go to your nearest emergency department or call:  Your local emergency services (911 in the [place]  A suicide crisis helpline, such as the [place] at 520-772-6896. This is open 24 hours a day. Summary  Dementia often affects memory and thinking.  Some types of dementia get worse with time and cannot be reversed.  Treatment for this condition depends on the cause.  Talk with  your doctor to figure out what you need help with.  Your doctor can help you find support groups and other doctors who can help with your care. This information is not intended to replace advice given to you by your health care provider. Make sure you discuss any questions you have with your health care provider. Document Revised: 11/15/2018 Document Reviewed: 11/15/2018 Elsevier Patient Education  [place].

## 2020-07-13 NOTE — Discharge Summary (Signed)
Physician Discharge Summary  Amy Hoffman KXF:[id number] DOB: [birth year] DOA: 07/10/2020  PCP: [name], NP  Admit date: 07/10/2020 Discharge date: 07/13/2020  Admitted From:  HOME  Disposition: [facility] (Family declined SNF)  Recommendations for Outpatient Follow-up:  1. Follow up with PCP in 1 weeks 2. Establish care with endocrinology 3. Establish care with neurology  4. Ambulatory referral for outpatient palliative medicine made   Home Health: PT,  RN    Discharge Condition: STABLE   CODE STATUS: DNR    Brief Hospitalization Summary: Please see all hospital notes, images, labs for full details of the hospitalization. ADMISSION HPI: Amy Hoffman is a 79 y.o. female with medical history significant of abnormal uterine bleeding, allergic rhinitis, anxiety, depression, dementia, right breast cancer, type 2 diabetes mellitus, hypertension, hyperlipidemia, obesity, GERD, osteoarthritis who was recently admitted from October 17 through October 21, who today is brought to the emergency department due to bilateral lower extremities.  Her primary care provider ordered labs on her yesterday, which show worsening renal function and her family was asked to bring the patient to the emergency department for further evaluation.  Patient was confused and unable to provide further information.  ED Course: Initial vital signs were temperature 97.9 F, pulse 70, respirations 16, BP 162/74 mmHg O2 sat was 96% on nasal cannula oxygen.The patient received 500 mL bolus in the emergency department.  Labs: Urinalysis showed glucosuria 50 and proteinuria 30 mg/dL, but was otherwise normal.  CBC showed a white count 8.7, hemoglobin 9.3 g/dL and platelets 350.  Lactic acid was normal.  SARS coronavirus 2 and influenza PCR was negative. BNP was 92.0 pg/mL.CMP shows normal electrolytes when calcium is corrected to albumin.  Glucose 260, BUN 49 creatinine 1.54 mg/dL.  Her creatinine level was  1.08 mg/dL 6 days ago.  Imaging: 2 view chest radiograph shows bilateral pleural effusions, left greater than right and bibasilar atelectasis or infiltrates, which increased since the prior exam.   Brief Admission History:  79 y/o female recently discharged for treatment of UTI, has dementia, recently discharged from [facility], presented with poor oral intake, chronic lower extremity edema, failure to thrive, dehydration and acute kidney injury with bilateral pleural effusions L>R.    Hospital Course and Assessment & Plan:   1. Adult FTT - Pt unfortunately had not been eating and drinking well prior to admission and was dehydrated.  I have asked dietitian for a consultation. Pt was started on Glucerna Shakes. She was rehydrated with IV fluids.  This increasingly poor appetite is likely progression of underlying dementia.  I have asked for a palliative medicine consultation for goals of care discussions with caretakers.  Daughter would like to receive outpatient palliative medicine services.  I made ambulatory referral for outpatient palliative medicine.  2. Chronic hypoxic respiratory failure - Pt reportedly has not been fully compliant with wearing home oxygen. This was restarted and used as needed per family.  3. AKI on CKD stage 3b - prerenal from poor oral intake, gently hydrated with IV fluids. Follow BMP.  Creatinine is improved.  4. Poorly controlled type 2 diabetes mellitus with hyperglycemia - Pt was treated with SSI, prandial coverage and CBG monitoring.  Ok to resume [place] at discharge. Family not interested in discharging on insulin at this time.  Family requested endocrinology referral which was ordered at discharge.  CBG have been mostly 140-180.  Family requested outpatient endocrinology referral which was completed.  5. Hyperlipidemia - continuing home  simvastatin 20 mg daily.  6. Dementia - continue home meds, aricept was discontinued due to cardiac issues with bradycardia and  tachybrady syndrome.  Dementia seems to be more progressive.  Ambulatory referral to neurology made at discharge.  7. History of breast cancer - she remains on maintenance therapy with arimidex.  8. Moderate protein calorie malnutrition - dietitian consultation requested.  9. Anemia of chronic disease - Hg 9.5,Follow outpatient with her PCP.    10. Paroxysmal atrial fibrillation - She remains on home amiodarone 200 mg twice daily plus apixaban 2.5 mg twice daily for anticoagulation.  Outpatient cardiology follow up in December 2021 scheduled.    11. DNR - present on admission.  12. Bilateral pleural effusion - Left greater than right - US thoracentesis completed and 650 cc removed.  fluid sent for studies but consistent with pleural fluid.      DVT prophylaxis: apixaban  Code Status: DNR  Family Communication: t/c to Denny Peon, 10/29 Disposition:  Home with HH (family declined SNF)  Discharge Diagnoses:  Principal Problem:   Failure to thrive in adult Active Problems:   Breast CA ([place])   Hyperlipidemia, unspecified   Paroxysmal atrial fibrillation (HCC)   Dementia with behavioral disturbance ([place])   Do not resuscitate status   Type 2 diabetes mellitus (HCC)   AKI (acute kidney injury) ([place])   Pleural effusion, bilateral   Chronic respiratory failure with hypoxia (HCC)   Anticoagulated   Acute on chronic diastolic CHF ([place])   Microcytic anemia   Mild protein malnutrition ([place])  Discharge Instructions: Discharge Instructions    Amb Referral to Palliative Care   Complete by: As directed    Ambulatory referral to Endocrinology   Complete by: As directed    Ambulatory referral to Neurology   Complete by: As directed    An appointment is requested in approximately: 2 weeks     Allergies as of 07/13/2020      Reactions   Codeine    Zantac [ranitidine Hcl]       Medication List    TAKE these medications   acetaminophen 325 MG tablet Commonly known as: TYLENOL Take 2  tablets (650 mg total) by mouth every 6 (six) hours as needed for mild pain (or Fever >/= 101).   amiodarone 200 MG tablet Commonly known as: PACERONE Take 1 tablet (200 mg total) by mouth daily.   amLODipine 5 MG tablet Commonly known as: NORVASC Take 1 tablet (5 mg total) by mouth daily.   anastrozole 1 MG tablet Commonly known as: ARIMIDEX Take 1 tablet (1 mg total) by mouth daily.   apixaban 2.5 MG Tabs tablet Commonly known as: Eliquis Take 1 tablet (2.5 mg total) by mouth 2 (two) times daily.   busPIRone 5 MG tablet Commonly known as: BUSPAR Take 1 tablet (5 mg total) by mouth 2 (two) times daily.   feeding supplement (GLUCERNA SHAKE) Liqd Take 237 mLs by mouth 3 (three) times daily between meals.   simvastatin 20 MG tablet Commonly known as: ZOCOR Take 1 tablet (20 mg total) by mouth daily.   sitaGLIPtin 100 MG tablet Commonly known as: JANUVIA Take 1 tablet (100 mg total) by mouth daily.       Follow-up Information    [name], NP. Schedule an appointment as soon as possible for a visit in 1 week(s).   Specialty: Nurse Practitioner Contact information: [address] [place] [address] [phone]        Harl Bowie,  Alphonse Guild, MD. Schedule an appointment as soon as possible for a visit in 1 month(s).   Specialty: Cardiology Contact information: [place] [address] [phone]        Doree Albee, MD. Schedule an appointment as soon as possible for a visit in 1 week(s).   Specialty: Internal Medicine Contact information: [place] [address] [phone]        Cassandria Anger, MD. Schedule an appointment as soon as possible for a visit in 2 week(s).   Specialty: Endocrinology Contact information: [place] [address] [phone]              Allergies  Allergen Reactions  . Codeine   . Zantac [Ranitidine Hcl]    Allergies as of 07/13/2020      Reactions    Codeine    Zantac [ranitidine Hcl]       Medication List    TAKE these medications   acetaminophen 325 MG tablet Commonly known as: TYLENOL Take 2 tablets (650 mg total) by mouth every 6 (six) hours as needed for mild pain (or Fever >/= 101).   amiodarone 200 MG tablet Commonly known as: PACERONE Take 1 tablet (200 mg total) by mouth daily.   amLODipine 5 MG tablet Commonly known as: NORVASC Take 1 tablet (5 mg total) by mouth daily.   anastrozole 1 MG tablet Commonly known as: ARIMIDEX Take 1 tablet (1 mg total) by mouth daily.   apixaban 2.5 MG Tabs tablet Commonly known as: Eliquis Take 1 tablet (2.5 mg total) by mouth 2 (two) times daily.   busPIRone 5 MG tablet Commonly known as: BUSPAR Take 1 tablet (5 mg total) by mouth 2 (two) times daily.   feeding supplement (GLUCERNA SHAKE) Liqd Take 237 mLs by mouth 3 (three) times daily between meals.   simvastatin 20 MG tablet Commonly known as: ZOCOR Take 1 tablet (20 mg total) by mouth daily.   sitaGLIPtin 100 MG tablet Commonly known as: JANUVIA Take 1 tablet (100 mg total) by mouth daily.       Procedures/Studies: DG Chest 1 View  Result Date: 07/11/2020 CLINICAL DATA:  LEFT pleural effusion post thoracentesis EXAM: CHEST  1 VIEW COMPARISON:  07/10/2020 FINDINGS: Enlargement of cardiac silhouette with pulmonary vascular congestion. Atherosclerotic calcification aorta. Small bibasilar pleural effusions and atelectasis. Improved aeration and decreased pleural effusions/atelectasis at LEFT base since prior exam. No pneumothorax. Bones demineralized. IMPRESSION: No pneumothorax following LEFT thoracentesis. Electronically Signed   By: Lavonia Dana M.D.   On: 07/11/2020 11:36   DG Chest 2 View  Result Date: 07/10/2020 CLINICAL DATA:  79 year old female with altered mental status. EXAM: CHEST - 2 VIEW COMPARISON:  Chest radiograph dated 06/30/2020. FINDINGS: There are bilateral pleural effusions, left greater right,  which have increased in size compared to the prior radiograph. Bibasilar atelectasis or infiltrate, also increased since the prior radiograph. No pneumothorax. Stable cardiomegaly. Atherosclerotic calcification of the aorta. No acute osseous pathology. Surgical clips over the right chest as well as sclerotic lesion of the proximal left humeral diaphysis similar to prior radiograph. IMPRESSION: Bilateral pleural effusions, left greater right, and bibasilar atelectasis or infiltrate, increased since the prior radiograph. Electronically Signed   By: Anner Crete M.D.   On: 07/10/2020 15:51   DG Chest 2 View  Result Date: 06/30/2020 CLINICAL DATA:  Foul-smelling urine and abnormal EKG EXAM: CHEST - 2 VIEW COMPARISON:  02/13/2020 FINDINGS: Cardiac shadow is enlarged.  Aortic calcifications are noted. Lungs are well aerated bilaterally. Chronic blunting of the right costophrenic angle is noted. No new focal infiltrate is seen. Mild central vascular congestion is noted. IMPRESSION: Changes of mild CHF without edema. Chronic changes in the right base. Electronically Signed   By: Inez Catalina M.D.   On: 06/30/2020 15:32   CT HEAD WO CONTRAST  Result Date: 07/01/2020 CLINICAL DATA:  Altered mental status on top of baseline dementia. Chronic anticoagulation. EXAM: CT HEAD WITHOUT CONTRAST TECHNIQUE: Contiguous axial images were obtained from the base of the skull through the vertex without intravenous contrast. COMPARISON:  None. FINDINGS: Brain: Examination is technically limited by motion artifact. There is evidence of diffuse cerebral atrophy. Ventricular dilatation is likely due to central atrophy. Low-attenuation changes in the deep white matter likely represent small vessel ischemic change. No obvious mass effect or midline shift. No acute intracranial hemorrhage is demonstrated. No abnormal extra-axial fluid collections. Vascular: No acute vascular abnormality is identified. Skull: Visualized calvarium  appears intact. Sinuses/Orbits: Visualized paranasal sinuses and mastoid air cells are clear. Other: None. IMPRESSION: 1. Technically limited study due to motion artifact. 2. No acute intracranial abnormalities are demonstrated. 3. Chronic atrophy and small vessel ischemic changes. Electronically Signed   By: Lucienne Capers M.D.   On: 07/01/2020 00:43   US THORACENTESIS ASP PLEURAL SPACE W/IMG GUIDE  Result Date: 07/11/2020 INDICATION: LEFT pleural effusion EXAM: ULTRASOUND GUIDED DIAGNOSTIC AND THERAPEUTIC THORACENTESIS MEDICATIONS: None COMPLICATIONS: None immediate PROCEDURE: An ultrasound guided thoracentesis was thoroughly discussed with the patient and questions answered. The benefits, risks, alternatives and complications were also discussed. The patient understands and wishes to proceed with the procedure. Written consent was obtained. Ultrasound was performed to localize and mark an adequate pocket of fluid in the LEFT chest. The area was then prepped and draped in the normal sterile fashion. 1% Lidocaine was used for local anesthesia. Under ultrasound guidance a 8 French thoracentesis catheter was introduced. Thoracentesis was performed. The catheter was removed and a dressing applied. FINDINGS: A total of approximately 630 mL of yellow LEFT pleural fluid was removed. Samples were sent to the laboratory as requested by the clinical team. IMPRESSION: Successful ultrasound guided LEFT thoracentesis yielding 630 mL of pleural fluid. Electronically Signed   By: Lavonia Dana M.D.   On: 07/11/2020 11:35      Subjective: Pt without any complaints today.  She says she would eat breakfast and wants something to drink. She is able to verbalize her needs at this time.   Discharge Exam: Vitals:   07/13/20 0611 07/13/20 1013  BP: (!) 126/98   Pulse: 69   Resp: 20   Temp: 97.8 F (36.6 C)   SpO2: 100% 90%   Vitals:   07/12/20 1500 07/12/20 2139 07/13/20 0611 07/13/20 1013  BP: 138/69 (!) 128/95  (!) 126/98   Pulse: 83 64 69   Resp: 20 20 20    Temp: 98.3 F (36.8 C) 97.6 F (36.4 C) 97.8 F (36.6 C)   TempSrc: Axillary Axillary    SpO2: 98% 95% 100% 90%  Weight:      Height:       General: Pt is alert, awake, not in acute distress, frail, elderly with dementia.  Cardiovascular: normal S1/S2 +, no rubs, no gallops Respiratory: CTA bilaterally, no wheezing, no rhonchi Abdominal: Soft, NT, ND, bowel sounds + Extremities: 1+ edema unchanged, no cyanosis Neuro: nonfocal exam.    The results of significant diagnostics from this hospitalization (including imaging, microbiology,  ancillary and laboratory) are listed below for reference.     Microbiology: Recent Results (from the past 240 hour(s))  Respiratory Panel by RT PCR (Flu A&B, Covid) - Nasopharyngeal Swab     Status: None   Collection Time: 07/10/20  3:15 PM   Specimen: Nasopharyngeal Swab  Result Value Ref Range Status   SARS Coronavirus 2 by RT PCR NEGATIVE NEGATIVE Final    Comment: (NOTE) SARS-CoV-2 target nucleic acids are NOT DETECTED.  The SARS-CoV-2 RNA is generally detectable in upper respiratoy specimens during the acute phase of infection. The lowest concentration of SARS-CoV-2 viral copies this assay can detect is 131 copies/mL. A negative result does not preclude SARS-Cov-2 infection and should not be used as the sole basis for treatment or other patient management decisions. A negative result may occur with  improper specimen collection/handling, submission of specimen other than nasopharyngeal swab, presence of viral mutation(s) within the areas targeted by this assay, and inadequate number of viral copies (<131 copies/mL). A negative result must be combined with clinical observations, patient history, and epidemiological information. The expected result is Negative.  Fact Sheet for Patients:  PinkCheek.be  Fact Sheet for Healthcare Providers:   GravelBags.it  This test is no t yet approved or cleared by the [place] FDA and  has been authorized for detection and/or diagnosis of SARS-CoV-2 by FDA under an Emergency Use Authorization (EUA). This EUA will remain  in effect (meaning this test can be used) for the duration of the COVID-19 declaration under Section 564(b)(1) of the Act, 21 U.S.C. section 360bbb-3(b)(1), unless the authorization is terminated or revoked sooner.     Influenza A by PCR NEGATIVE NEGATIVE Final   Influenza B by PCR NEGATIVE NEGATIVE Final    Comment: (NOTE) The Xpert Xpress SARS-CoV-2/FLU/RSV assay is intended as an aid in  the diagnosis of influenza from Nasopharyngeal swab specimens and  should not be used as a sole basis for treatment. Nasal washings and  aspirates are unacceptable for Xpert Xpress SARS-CoV-2/FLU/RSV  testing.  Fact Sheet for Patients: PinkCheek.be  Fact Sheet for Healthcare Providers: GravelBags.it  This test is not yet approved or cleared by the [place] FDA and  has been authorized for detection and/or diagnosis of SARS-CoV-2 by  FDA under an Emergency Use Authorization (EUA). This EUA will remain  in effect (meaning this test can be used) for the duration of the  Covid-19 declaration under Section 564(b)(1) of the Act, 21  U.S.C. section 360bbb-3(b)(1), unless the authorization is  terminated or revoked. Performed at [facility], [address] [place], [place] [address]   Urine Culture     Status: None   Collection Time: 07/10/20  7:15 PM   Specimen: Urine, Clean Catch  Result Value Ref Range Status   Specimen Description   Final    URINE, CLEAN CATCH Performed at [facility], [address], [place], [place] [address]    Special Requests   Final    NONE Performed at [facility], [address], [place], [place] [address]    Culture   Final    NO GROWTH Performed  at [facility], [place] [address], [place], [place] [address]    Report Status 07/12/2020 FINAL  Final  Gram stain     Status: None   Collection Time: 07/11/20 11:06 AM   Specimen: Pleura; Body Fluid  Result Value Ref Range Status   Specimen Description PLEURAL  Final   Special Requests NONE  Final  Gram Stain   Final    NO ORGANISMS SEEN WBC PRESENT, PREDOMINANTLY MONONUCLEAR Performed at [facility], [address] [place], [place] [address]    Report Status 07/11/2020 FINAL  Final     Labs: BNP (last 3 results) Recent Labs    02/06/20 0533 06/30/20 1443 07/11/20 0005  BNP 172.0* 1,208.0* 58.5   Basic Metabolic Panel: Recent Labs  Lab 07/09/20 0828 07/10/20 1534 07/11/20 0856 07/12/20 0441 07/13/20 0609  NA 141 136 137 138 139  K 4.8 4.2 4.4 4.2 5.1  CL 102 101 104 105 106  CO2 26 26 24 23 24   GLUCOSE 237* 260* 190* 124* 88  BUN 41* 49* 46* 43* 50*  CREATININE 1.38* 1.54* 1.32* 1.10* 1.37*  CALCIUM 9.2 8.5* 8.3* 8.2* 8.5*  MG  --  2.3  --  2.3  --    Liver Function Tests: Recent Labs  Lab 07/09/20 0828 07/10/20 1534 07/11/20 0856  AST 28 22 23   ALT 41* 40 38  ALKPHOS  --  98 97  BILITOT 0.7 0.6 0.6  PROT 6.7 6.6 6.5  ALBUMIN  --  3.0* 3.0*   No results for input(s): LIPASE, AMYLASE in the last 168 hours. No results for input(s): AMMONIA in the last 168 hours. CBC: Recent Labs  Lab 07/09/20 0828 07/10/20 1534 07/11/20 0856 07/12/20 0441  WBC 10.9* 8.7 8.3 11.7*  NEUTROABS  --  7.7  --   --   HGB 10.0* 9.3* 9.5* 9.6*  HCT 32.3* 32.1* 33.2* 33.3*  MCV 87.3 88.4 90.2 89.5  PLT 358 350 326 358   Cardiac Enzymes: No results for input(s): CKTOTAL, CKMB, CKMBINDEX, TROPONINI in the last 168 hours. BNP: Invalid input(s): POCBNP CBG: Recent Labs  Lab 07/12/20 1734 07/12/20 2117 07/13/20 0250 07/13/20 0731 07/13/20 1001  GLUCAP 70 96 91 62* 163*   D-Dimer No results for input(s): DDIMER in the last 72 hours. Hgb A1c No results for  input(s): HGBA1C in the last 72 hours. Lipid Profile No results for input(s): CHOL, HDL, LDLCALC, TRIG, CHOLHDL, LDLDIRECT in the last 72 hours. Thyroid function studies No results for input(s): TSH, T4TOTAL, T3FREE, THYROIDAB in the last 72 hours.  Invalid input(s): FREET3 Anemia work up No results for input(s): VITAMINB12, FOLATE, FERRITIN, TIBC, IRON, RETICCTPCT in the last 72 hours. Urinalysis    Component Value Date/Time   COLORURINE YELLOW 07/10/2020 1915   APPEARANCEUR CLEAR 07/10/2020 1915   LABSPEC 1.016 07/10/2020 1915   PHURINE 5.0 07/10/2020 1915   GLUCOSEU 50 (A) 07/10/2020 1915   HGBUR NEGATIVE 07/10/2020 [place] NEGATIVE 07/10/2020 [place] NEGATIVE 07/10/2020 1915   PROTEINUR 30 (A) 07/10/2020 1915   UROBILINOGEN 0.2 01/23/2013 2317   NITRITE NEGATIVE 07/10/2020 1915   LEUKOCYTESUR NEGATIVE 07/10/2020 1915   Sepsis Labs Invalid input(s): PROCALCITONIN,  WBC,  LACTICIDVEN Microbiology Recent Results (from the past 240 hour(s))  Respiratory Panel by RT PCR (Flu A&B, Covid) - Nasopharyngeal Swab     Status: None   Collection Time: 07/10/20  3:15 PM   Specimen: Nasopharyngeal Swab  Result Value Ref Range Status   SARS Coronavirus 2 by RT PCR NEGATIVE NEGATIVE Final    Comment: (NOTE) SARS-CoV-2 target nucleic acids are NOT DETECTED.  The SARS-CoV-2 RNA is generally detectable in upper respiratoy specimens during the acute phase of infection. The lowest concentration of SARS-CoV-2 viral copies this assay can detect is 131 copies/mL. A negative result does not preclude SARS-Cov-2 infection and should  not be used as the sole basis for treatment or other patient management decisions. A negative result may occur with  improper specimen collection/handling, submission of specimen other than nasopharyngeal swab, presence of viral mutation(s) within the areas targeted by this assay, and inadequate number of viral copies (<131 copies/mL). A negative  result must be combined with clinical observations, patient history, and epidemiological information. The expected result is Negative.  Fact Sheet for Patients:  PinkCheek.be  Fact Sheet for Healthcare Providers:  GravelBags.it  This test is no t yet approved or cleared by the [place] FDA and  has been authorized for detection and/or diagnosis of SARS-CoV-2 by FDA under an Emergency Use Authorization (EUA). This EUA will remain  in effect (meaning this test can be used) for the duration of the COVID-19 declaration under Section 564(b)(1) of the Act, 21 U.S.C. section 360bbb-3(b)(1), unless the authorization is terminated or revoked sooner.     Influenza A by PCR NEGATIVE NEGATIVE Final   Influenza B by PCR NEGATIVE NEGATIVE Final    Comment: (NOTE) The Xpert Xpress SARS-CoV-2/FLU/RSV assay is intended as an aid in  the diagnosis of influenza from Nasopharyngeal swab specimens and  should not be used as a sole basis for treatment. Nasal washings and  aspirates are unacceptable for Xpert Xpress SARS-CoV-2/FLU/RSV  testing.  Fact Sheet for Patients: PinkCheek.be  Fact Sheet for Healthcare Providers: GravelBags.it  This test is not yet approved or cleared by the [place] FDA and  has been authorized for detection and/or diagnosis of SARS-CoV-2 by  FDA under an Emergency Use Authorization (EUA). This EUA will remain  in effect (meaning this test can be used) for the duration of the  Covid-19 declaration under Section 564(b)(1) of the Act, 21  U.S.C. section 360bbb-3(b)(1), unless the authorization is  terminated or revoked. Performed at [facility], [address] [place], [place] [address]   Urine Culture     Status: None   Collection Time: 07/10/20  7:15 PM   Specimen: Urine, Clean Catch  Result Value Ref Range Status   Specimen Description   Final     URINE, CLEAN CATCH Performed at [facility], [address], [place], [place] [address]    Special Requests   Final    NONE Performed at [facility], [address], [place], [place] [address]    Culture   Final    NO GROWTH Performed at [facility], [place] [address] [place], [place] [address]    Report Status 07/12/2020 FINAL  Final  Gram stain     Status: None   Collection Time: 07/11/20 11:06 AM   Specimen: Pleura; Body Fluid  Result Value Ref Range Status   Specimen Description PLEURAL  Final   Special Requests NONE  Final   Gram Stain   Final    NO ORGANISMS SEEN WBC PRESENT, PREDOMINANTLY MONONUCLEAR Performed at [facility], [address] [place], [place] [address]    Report Status 07/11/2020 FINAL  Final   Time coordinating discharge: 38 mins   SIGNED:  Irwin Brakeman, MD  [facility] 07/13/2020, 10:31 AM How to contact the [facility] Attending or Consulting provider [place] or covering provider during after hours [place], for this patient?  1. Check the care team in [facility] and look for a) attending/consulting [facility] provider listed and b) the [facility] team listed 2. Log into [email or web address] and use [place]'s universal password to access. If you do not have the password,  please contact the hospital operator. 3. Locate the [facility] provider you are looking for under [facility] and [place] to a number that you can be directly reached. 4. If you still have difficulty reaching the provider, please [place] the [facility] (Director on Call) for the Hospitalists listed on amion for assistance.

## 2020-07-13 NOTE — Progress Notes (Addendum)
Patient unable to ambulate for oxygen desat test. Patient was currently on 2 liters O2 sat at 100%.  Room air for 30 min patient sat 90%  Dr. Wynetta Emery notified.

## 2020-07-13 NOTE — Progress Notes (Signed)
[name] [place] to pick up patient at discharge. Informed husband she will have home health. Discharge instructions given. All belongings sent with patient. No new questions at this time.

## 2020-07-13 NOTE — Plan of Care (Signed)
  Problem: Education: Goal: Knowledge of General Education information will improve Description: Including pain rating scale, medication(s)/side effects and non-pharmacologic comfort measures 07/13/2020 0948 by Cameron Ali, RN Outcome: Completed/Met 07/13/2020 0824 by Cameron Ali, RN Outcome: Progressing   Problem: Health Behavior/Discharge Planning: Goal: Ability to manage health-related needs will improve 07/13/2020 0948 by Cameron Ali, RN Outcome: Completed/Met 07/13/2020 0824 by Cameron Ali, RN Outcome: Progressing   Problem: Clinical Measurements: Goal: Ability to maintain clinical measurements within normal limits will improve 07/13/2020 0948 by Cameron Ali, RN Outcome: Completed/Met 07/13/2020 0824 by Cameron Ali, RN Outcome: Progressing Goal: Will remain free from infection 07/13/2020 0948 by Cameron Ali, RN Outcome: Completed/Met 07/13/2020 0824 by Cameron Ali, RN Outcome: Progressing Goal: Diagnostic test results will improve 07/13/2020 0948 by Cameron Ali, RN Outcome: Completed/Met 07/13/2020 0824 by Cameron Ali, RN Outcome: Progressing Goal: Respiratory complications will improve 07/13/2020 0948 by Cameron Ali, RN Outcome: Completed/Met 07/13/2020 0824 by Cameron Ali, RN Outcome: Progressing Goal: Cardiovascular complication will be avoided 07/13/2020 0948 by Cameron Ali, RN Outcome: Completed/Met 07/13/2020 0824 by Cameron Ali, RN Outcome: Progressing   Problem: Activity: Goal: Risk for activity intolerance will decrease 07/13/2020 0948 by Cameron Ali, RN Outcome: Completed/Met 07/13/2020 0824 by Cameron Ali, RN Outcome: Progressing   Problem: Nutrition: Goal: Adequate nutrition will be maintained 07/13/2020 0948 by Cameron Ali, RN Outcome: Completed/Met 07/13/2020 0824 by Cameron Ali, RN Outcome: Progressing   Problem: Coping: Goal: Level  of anxiety will decrease 07/13/2020 0948 by Cameron Ali, RN Outcome: Completed/Met 07/13/2020 0824 by Cameron Ali, RN Outcome: Progressing   Problem: Elimination: Goal: Will not experience complications related to bowel motility 07/13/2020 0948 by Cameron Ali, RN Outcome: Completed/Met 07/13/2020 0824 by Cameron Ali, RN Outcome: Progressing Goal: Will not experience complications related to urinary retention 07/13/2020 0948 by Cameron Ali, RN Outcome: Completed/Met 07/13/2020 0824 by Cameron Ali, RN Outcome: Progressing   Problem: Pain Managment: Goal: General experience of comfort will improve 07/13/2020 0948 by Cameron Ali, RN Outcome: Completed/Met 07/13/2020 0824 by Cameron Ali, RN Outcome: Progressing   Problem: Safety: Goal: Ability to remain free from injury will improve 07/13/2020 0948 by Cameron Ali, RN Outcome: Completed/Met 07/13/2020 0824 by Cameron Ali, RN Outcome: Progressing   Problem: Skin Integrity: Goal: Risk for impaired skin integrity will decrease 07/13/2020 0948 by Cameron Ali, RN Outcome: Completed/Met 07/13/2020 0824 by Cameron Ali, RN Outcome: Progressing

## 2020-07-13 NOTE — TOC Transition Note (Signed)
Transition of Care [facility]) - CM/SW Discharge Note   Patient Details  Name: Amy Hoffman MRN: [id number] Date of Birth: December 25, 1940  Transition of [facility]) CM/SW Contact:  Natasha Bence, LCSW Phone Number: 07/13/2020, 3:22 PM   Clinical Narrative:    CSW notified Corene Cornea with [facility] of patient's discharge. Corene Cornea agreeable to resume HH. TOC signing off.   Final next level of care: [place] Barriers to Discharge: Continued Medical Work up   Patient Goals and CMS Choice Patient states their goals for this hospitalization and ongoing recovery are:: Discharge with [facility] if SNF is not recommended CMS Medicare.gov Compare Post Acute Care list provided to:: Patient Represenative (must comment) (Husband, daughter) Choice offered to / list presented to : Spouse, Adult Children  Discharge Placement                    Patient and family notified of of transfer: 07/13/20  Discharge Plan and Services In-house Referral: Clinical Social Work, Hospice / Palliative Care Discharge Planning Services: NA Post Acute Care Choice: Resumption of Svcs/PTA Provider          DME Arranged: N/A DME Agency: NA       HH Arranged: RN, PT [place] Agency: [place] ([place]) Date [place]: 07/13/20 Time [place]: 1522 Representative spoke with at [place]: [place] Determinants of Health ([place]) Interventions     Readmission Risk Interventions Readmission Risk Prevention Plan 07/11/2020 07/01/2020 02/13/2020  Transportation Screening Complete Complete Complete  Medication Review Press photographer) Complete Complete Complete  PCP or Specialist appointment within 3-5 days of discharge - - Not Complete  HRI or Home Care Consult Complete Complete Not Complete  SW Recovery Care/Counseling Consult Complete Complete Complete  Palliative Care Screening Complete Not Applicable Complete  Skilled Nursing Facility Complete Not Applicable Complete  Some  recent data might be hidden

## 2020-07-16 ENCOUNTER — Telehealth (INDEPENDENT_AMBULATORY_CARE_PROVIDER_SITE_OTHER): Payer: Medicare Other | Admitting: Internal Medicine

## 2020-07-16 ENCOUNTER — Telehealth (INDEPENDENT_AMBULATORY_CARE_PROVIDER_SITE_OTHER): Payer: Self-pay

## 2020-07-16 ENCOUNTER — Encounter (INDEPENDENT_AMBULATORY_CARE_PROVIDER_SITE_OTHER): Payer: Self-pay | Admitting: Internal Medicine

## 2020-07-16 DIAGNOSIS — F039 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: Secondary | ICD-10-CM | POA: Diagnosis not present

## 2020-07-16 DIAGNOSIS — E1165 Type 2 diabetes mellitus with hyperglycemia: Secondary | ICD-10-CM | POA: Diagnosis not present

## 2020-07-16 NOTE — Telephone Encounter (Signed)
I was able to call Christy back from [facility] and I was able to give her verbal orders to go ahead with all requested home health orders.  She tells me she will fax this to our office so that we can sign off on it.  Awaiting fax to come through.

## 2020-07-16 NOTE — Telephone Encounter (Signed)
Received a VM from [place] from [place] and requesting [place] for patient:   3 times weekly for 1 week 2 times weekly for 3 weeks 1 time weekly for 3 weeks  Also requesting approval for OT, Speech Therapy, Social Worker to [place], and [place] for 1 time weekly for 4 weeks  Christy cell number: [phone] Office number: [phone]  Please advise if OK for All orders.

## 2020-07-16 NOTE — Progress Notes (Signed)
Metrics: Intervention Frequency ACO  Documented Smoking Status Yearly  Screened one or more times in 24 months  Cessation Counseling or  Active cessation medication Past 24 months  Past 24 months   Guideline developer: UpToDate (See UpToDate for funding source) Date Released: 2014       Wellness Office Visit  Subjective:  Patient ID: Amy Hoffman, female    DOB: 03/26/1941  Age: 79 y.o. MRN: [id number]  CC: This is an audio telemedicine visit with the patient's husband. The patient herself is demented and is unable to give a good history. I was able to identify the patient's voice from previous visits. HPI  The patient was recently hospitalized and was discharged from the hospital 2 days ago. It was felt that she was failing to thrive and required rehydration with intravenous fluids. The husband was wondering if she could receive any further help at home, thereby possibly preventing further hospitalizations in the future. Past Medical History:  Diagnosis Date  . Abnormal uterine and vaginal bleeding, unspecified   . Allergic rhinitis due to pollen   . Anxiety   . Asthma   . Cancer [facility])    breast cancer - right  . Dementia ([place])   . Depression   . Diabetes mellitus without complication ([place])   . Essential (primary) hypertension   . Hyperlipidemia, unspecified   . Hypertension   . Major depressive disorder, single episode, unspecified   . Obesity, unspecified   . Pain in joint involving shoulder region   . Reflux esophagitis   . Trigger finger, acquired   . Type 2 diabetes mellitus with hyperglycemia ([place])   . Unspecified asthma with (acute) exacerbation   . 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 ([place])   . Unspecified osteoarthritis, unspecified site    Past Surgical History:  Procedure Laterality Date  . CHOLECYSTECTOMY    . MASTECTOMY Right   . RE-EXCISION OF BREAST LUMPECTOMY       Family History  Family history unknown: Yes    Social History    Social History Narrative   Long term resident of [facility]    Social History   Tobacco Use  . Smoking status: Never Smoker  . Smokeless tobacco: Never Used  Substance Use Topics  . Alcohol use: No    Current Meds  Medication Sig  . acetaminophen (TYLENOL) 325 MG tablet Take 2 tablets (650 mg total) by mouth every 6 (six) hours as needed for mild pain (or Fever >/= 101).  Marland Kitchen amiodarone (PACERONE) 200 MG tablet Take 1 tablet (200 mg total) by mouth daily.  Marland Kitchen amLODipine (NORVASC) 5 MG tablet Take 1 tablet (5 mg total) by mouth daily.  Marland Kitchen anastrozole (ARIMIDEX) 1 MG tablet Take 1 tablet (1 mg total) by mouth daily.  Marland Kitchen apixaban (ELIQUIS) 2.5 MG TABS tablet Take 1 tablet (2.5 mg total) by mouth 2 (two) times daily.  . busPIRone (BUSPAR) 5 MG tablet Take 1 tablet (5 mg total) by mouth 2 (two) times daily.  . feeding supplement, GLUCERNA SHAKE, (GLUCERNA SHAKE) LIQD Take 237 mLs by mouth 3 (three) times daily between meals.  . simvastatin (ZOCOR) 20 MG tablet Take 1 tablet (20 mg total) by mouth daily.  . sitaGLIPtin (JANUVIA) 100 MG tablet Take 1 tablet (100 mg total) by mouth daily.      Depression screen PHQ 2/9 05/15/2020  Decreased Interest 0  Down, Depressed, Hopeless 0  PHQ - 2 Score 0     Objective:   Today's Vitals:  There were no vitals taken for this visit. Vitals with BMI 07/16/2020 07/13/2020 07/12/2020  Height (No Data) - -  Weight (No Data) - -  BMI - - -  Systolic (No Data) 659 935  Diastolic (No Data) 98 95  Pulse - 69 64     Physical Exam  Virtual visit.     Assessment   1. Uncontrolled type 2 diabetes mellitus with hyperglycemia ([place])   2. Dementia without behavioral disturbance, unspecified dementia type ([place])       Tests ordered No orders of the defined types were placed in this encounter.    Plan: 1. We will try and arrange home health care to help this lady. 2. The husband also mentions some sort of heel ulcer and I have made an appointment  for her to be seen in the office next Monday with Sarah for a more extensive evaluation. 3. This phone call lasted 5 minutes.   No orders of the defined types were placed in this encounter.   Doree Albee, MD

## 2020-07-17 ENCOUNTER — Telehealth (INDEPENDENT_AMBULATORY_CARE_PROVIDER_SITE_OTHER): Payer: Self-pay

## 2020-07-17 DIAGNOSIS — Z515 Encounter for palliative care: Secondary | ICD-10-CM | POA: Diagnosis not present

## 2020-07-17 DIAGNOSIS — B370 Candidal stomatitis: Secondary | ICD-10-CM | POA: Diagnosis not present

## 2020-07-17 DIAGNOSIS — R410 Disorientation, unspecified: Secondary | ICD-10-CM

## 2020-07-17 DIAGNOSIS — F0151 Vascular dementia with behavioral disturbance: Secondary | ICD-10-CM | POA: Diagnosis not present

## 2020-07-17 DIAGNOSIS — I5032 Chronic diastolic (congestive) heart failure: Secondary | ICD-10-CM | POA: Diagnosis not present

## 2020-07-17 DIAGNOSIS — R062 Wheezing: Secondary | ICD-10-CM

## 2020-07-17 NOTE — Telephone Encounter (Signed)
Amy Hoffman with [facility] called and left a VM and stated that the patient has a sore throat, very weak, confusion that is more than normal and they are not sure what to do? Amy Hoffman would like a call back at [phone].  Please advise.

## 2020-07-17 NOTE — Telephone Encounter (Signed)
Called Amy Hoffman and gave her the message. Amy Hoffman stated that the patient does have some wheezing but she did listen to her lungs and did not hear any fluid or congestion.  They will come by to pick up the xray order and UA supplies.  Amy Hoffman did request to place order for chest xray and UA with culture if needed.

## 2020-07-17 NOTE — Telephone Encounter (Signed)
I placed orders in the system for urinalysis with reflex to culture and chest xray. Also placed orders for CBC with diff and CMP in the system.  I forgot to verify with you and the [facility] RN, but please verify that patient's oxygen saturations are in the 90s. If patient seems to worsen in any way over the weekend and/or while we await diagnostic test results she should proceed to the emergency department.

## 2020-07-17 NOTE — Telephone Encounter (Signed)
Received a VM from [facility] with Palliative Care for patient and she is asking if Judson Roch is OK with this service? Patient would like a call back at [phone].   Please advise if OK to provide?

## 2020-07-17 NOTE — Addendum Note (Signed)
Addended by: [name] on: 07/17/2020 05:12 PM   Modules accepted: Orders

## 2020-07-17 NOTE — Telephone Encounter (Signed)
Yes, I think palliative care would be appropriate for this patient at this time.  I am not sure if this referral has been discussed with the patient and the family, suggest make sure the team is aware that I am not sure how the family may react to the referral.  I have not seen this patient in the office in quite some time, she is scheduled to see me on Monday I believe.  I do plan on discussing this with them at that visit.

## 2020-07-17 NOTE — Telephone Encounter (Signed)
Please call them back and let them know that the patient appears to be hemodynamically stable meaning she has stable vital signs and her changes are only mild then I can order chest x-ray and urinalysis.  If the nurse is able to collect urine at the house that would be great, if not the patient can come to the office or feeling or can come to the office to get the urine cup and hat and bring the sample back as soon as possible.    If the patient's symptoms seem to be moderate to severe especially if she has hypotension, fever, or tachycardia she needs to be evaluated in the emergency department.    If the patient and the family elected to keep the patient at home and try to work her up in the outpatient setting they need to monitor over the weekend.  If her symptoms worsen over the weekend then she needs to be brought to the emergency department.  Please let me know the outcome of your discussion so I can place orders if necessary.  Thank you.

## 2020-07-17 NOTE — Telephone Encounter (Signed)
Called [place] and gave her the message from Judson Roch. Langley Gauss verbalized an understanding.

## 2020-07-18 ENCOUNTER — Emergency Department (HOSPITAL_COMMUNITY): Payer: Medicare Other

## 2020-07-18 ENCOUNTER — Encounter (HOSPITAL_COMMUNITY): Payer: Self-pay

## 2020-07-18 ENCOUNTER — Ambulatory Visit: Payer: Medicare Other | Admitting: Nurse Practitioner

## 2020-07-18 ENCOUNTER — Telehealth (INDEPENDENT_AMBULATORY_CARE_PROVIDER_SITE_OTHER): Payer: Self-pay

## 2020-07-18 ENCOUNTER — Other Ambulatory Visit: Payer: Self-pay

## 2020-07-18 ENCOUNTER — Inpatient Hospital Stay (HOSPITAL_COMMUNITY)
Admission: EM | Admit: 2020-07-18 | Discharge: 2020-08-14 | DRG: 291 | Disposition: E | Payer: Medicare Other | Attending: Internal Medicine | Admitting: Internal Medicine

## 2020-07-18 DIAGNOSIS — I484 Atypical atrial flutter: Secondary | ICD-10-CM | POA: Diagnosis present

## 2020-07-18 DIAGNOSIS — Z79899 Other long term (current) drug therapy: Secondary | ICD-10-CM | POA: Diagnosis not present

## 2020-07-18 DIAGNOSIS — E8809 Other disorders of plasma-protein metabolism, not elsewhere classified: Secondary | ICD-10-CM

## 2020-07-18 DIAGNOSIS — R0600 Dyspnea, unspecified: Secondary | ICD-10-CM

## 2020-07-18 DIAGNOSIS — N136 Pyonephrosis: Secondary | ICD-10-CM | POA: Diagnosis present

## 2020-07-18 DIAGNOSIS — Q6211 Congenital occlusion of ureteropelvic junction: Secondary | ICD-10-CM

## 2020-07-18 DIAGNOSIS — E860 Dehydration: Secondary | ICD-10-CM | POA: Diagnosis present

## 2020-07-18 DIAGNOSIS — Z20822 Contact with and (suspected) exposure to covid-19: Secondary | ICD-10-CM | POA: Diagnosis not present

## 2020-07-18 DIAGNOSIS — I10 Essential (primary) hypertension: Secondary | ICD-10-CM

## 2020-07-18 DIAGNOSIS — J9621 Acute and chronic respiratory failure with hypoxia: Secondary | ICD-10-CM | POA: Diagnosis not present

## 2020-07-18 DIAGNOSIS — R0902 Hypoxemia: Secondary | ICD-10-CM | POA: Diagnosis not present

## 2020-07-18 DIAGNOSIS — Z79811 Long term (current) use of aromatase inhibitors: Secondary | ICD-10-CM | POA: Diagnosis not present

## 2020-07-18 DIAGNOSIS — E43 Unspecified severe protein-calorie malnutrition: Secondary | ICD-10-CM | POA: Diagnosis present

## 2020-07-18 DIAGNOSIS — J90 Pleural effusion, not elsewhere classified: Secondary | ICD-10-CM | POA: Diagnosis not present

## 2020-07-18 DIAGNOSIS — I517 Cardiomegaly: Secondary | ICD-10-CM | POA: Diagnosis not present

## 2020-07-18 DIAGNOSIS — Z66 Do not resuscitate: Secondary | ICD-10-CM | POA: Diagnosis present

## 2020-07-18 DIAGNOSIS — Z7189 Other specified counseling: Secondary | ICD-10-CM

## 2020-07-18 DIAGNOSIS — Z7984 Long term (current) use of oral hypoglycemic drugs: Secondary | ICD-10-CM

## 2020-07-18 DIAGNOSIS — R410 Disorientation, unspecified: Secondary | ICD-10-CM | POA: Diagnosis not present

## 2020-07-18 DIAGNOSIS — B370 Candidal stomatitis: Secondary | ICD-10-CM | POA: Diagnosis not present

## 2020-07-18 DIAGNOSIS — R5381 Other malaise: Secondary | ICD-10-CM | POA: Diagnosis not present

## 2020-07-18 DIAGNOSIS — Z90710 Acquired absence of both cervix and uterus: Secondary | ICD-10-CM

## 2020-07-18 DIAGNOSIS — R609 Edema, unspecified: Secondary | ICD-10-CM | POA: Diagnosis not present

## 2020-07-18 DIAGNOSIS — E1165 Type 2 diabetes mellitus with hyperglycemia: Secondary | ICD-10-CM | POA: Diagnosis present

## 2020-07-18 DIAGNOSIS — L89623 Pressure ulcer of left heel, stage 3: Secondary | ICD-10-CM | POA: Diagnosis present

## 2020-07-18 DIAGNOSIS — Z515 Encounter for palliative care: Secondary | ICD-10-CM | POA: Diagnosis not present

## 2020-07-18 DIAGNOSIS — Z7901 Long term (current) use of anticoagulants: Secondary | ICD-10-CM

## 2020-07-18 DIAGNOSIS — L8990 Pressure ulcer of unspecified site, unspecified stage: Secondary | ICD-10-CM | POA: Insufficient documentation

## 2020-07-18 DIAGNOSIS — I130 Hypertensive heart and chronic kidney disease with heart failure and stage 1 through stage 4 chronic kidney disease, or unspecified chronic kidney disease: Principal | ICD-10-CM | POA: Diagnosis present

## 2020-07-18 DIAGNOSIS — I509 Heart failure, unspecified: Secondary | ICD-10-CM

## 2020-07-18 DIAGNOSIS — I495 Sick sinus syndrome: Secondary | ICD-10-CM | POA: Diagnosis present

## 2020-07-18 DIAGNOSIS — Z9889 Other specified postprocedural states: Secondary | ICD-10-CM

## 2020-07-18 DIAGNOSIS — J9811 Atelectasis: Secondary | ICD-10-CM

## 2020-07-18 DIAGNOSIS — R4182 Altered mental status, unspecified: Secondary | ICD-10-CM | POA: Diagnosis not present

## 2020-07-18 DIAGNOSIS — N1832 Chronic kidney disease, stage 3b: Secondary | ICD-10-CM | POA: Diagnosis present

## 2020-07-18 DIAGNOSIS — R0602 Shortness of breath: Secondary | ICD-10-CM | POA: Diagnosis not present

## 2020-07-18 DIAGNOSIS — I5032 Chronic diastolic (congestive) heart failure: Secondary | ICD-10-CM | POA: Diagnosis not present

## 2020-07-18 DIAGNOSIS — E870 Hyperosmolality and hypernatremia: Secondary | ICD-10-CM | POA: Diagnosis not present

## 2020-07-18 DIAGNOSIS — R062 Wheezing: Secondary | ICD-10-CM | POA: Diagnosis not present

## 2020-07-18 DIAGNOSIS — R9431 Abnormal electrocardiogram [ECG] [EKG]: Secondary | ICD-10-CM | POA: Diagnosis not present

## 2020-07-18 DIAGNOSIS — N1330 Unspecified hydronephrosis: Secondary | ICD-10-CM | POA: Diagnosis not present

## 2020-07-18 DIAGNOSIS — F0280 Dementia in other diseases classified elsewhere without behavioral disturbance: Secondary | ICD-10-CM | POA: Diagnosis not present

## 2020-07-18 DIAGNOSIS — F03918 Unspecified dementia, unspecified severity, with other behavioral disturbance: Secondary | ICD-10-CM | POA: Diagnosis present

## 2020-07-18 DIAGNOSIS — E1169 Type 2 diabetes mellitus with other specified complication: Secondary | ICD-10-CM

## 2020-07-18 DIAGNOSIS — E785 Hyperlipidemia, unspecified: Secondary | ICD-10-CM | POA: Diagnosis present

## 2020-07-18 DIAGNOSIS — I5033 Acute on chronic diastolic (congestive) heart failure: Secondary | ICD-10-CM | POA: Diagnosis present

## 2020-07-18 DIAGNOSIS — N179 Acute kidney failure, unspecified: Secondary | ICD-10-CM | POA: Diagnosis present

## 2020-07-18 DIAGNOSIS — R680 Hypothermia, not associated with low environmental temperature: Secondary | ICD-10-CM | POA: Diagnosis not present

## 2020-07-18 DIAGNOSIS — J9601 Acute respiratory failure with hypoxia: Secondary | ICD-10-CM | POA: Diagnosis not present

## 2020-07-18 DIAGNOSIS — E875 Hyperkalemia: Secondary | ICD-10-CM | POA: Diagnosis not present

## 2020-07-18 DIAGNOSIS — I4892 Unspecified atrial flutter: Secondary | ICD-10-CM | POA: Diagnosis not present

## 2020-07-18 DIAGNOSIS — I313 Pericardial effusion (noninflammatory): Principal | ICD-10-CM | POA: Diagnosis present

## 2020-07-18 DIAGNOSIS — F0391 Unspecified dementia with behavioral disturbance: Secondary | ICD-10-CM | POA: Diagnosis present

## 2020-07-18 DIAGNOSIS — F0151 Vascular dementia with behavioral disturbance: Secondary | ICD-10-CM | POA: Diagnosis not present

## 2020-07-18 DIAGNOSIS — J918 Pleural effusion in other conditions classified elsewhere: Secondary | ICD-10-CM | POA: Diagnosis not present

## 2020-07-18 DIAGNOSIS — Z9049 Acquired absence of other specified parts of digestive tract: Secondary | ICD-10-CM

## 2020-07-18 DIAGNOSIS — I959 Hypotension, unspecified: Secondary | ICD-10-CM | POA: Diagnosis not present

## 2020-07-18 DIAGNOSIS — Z48813 Encounter for surgical aftercare following surgery on the respiratory system: Secondary | ICD-10-CM | POA: Diagnosis not present

## 2020-07-18 DIAGNOSIS — I3139 Other pericardial effusion (noninflammatory): Secondary | ICD-10-CM

## 2020-07-18 DIAGNOSIS — R627 Adult failure to thrive: Secondary | ICD-10-CM | POA: Diagnosis present

## 2020-07-18 DIAGNOSIS — R7989 Other specified abnormal findings of blood chemistry: Secondary | ICD-10-CM | POA: Diagnosis not present

## 2020-07-18 DIAGNOSIS — E1122 Type 2 diabetes mellitus with diabetic chronic kidney disease: Secondary | ICD-10-CM | POA: Diagnosis present

## 2020-07-18 DIAGNOSIS — Z853 Personal history of malignant neoplasm of breast: Secondary | ICD-10-CM

## 2020-07-18 DIAGNOSIS — I4819 Other persistent atrial fibrillation: Secondary | ICD-10-CM | POA: Diagnosis present

## 2020-07-18 DIAGNOSIS — J45909 Unspecified asthma, uncomplicated: Secondary | ICD-10-CM | POA: Diagnosis present

## 2020-07-18 HISTORY — DX: Personal history of other mental and behavioral disorders: Z86.59

## 2020-07-18 HISTORY — DX: Malignant neoplasm of unspecified site of unspecified female breast: C50.919

## 2020-07-18 HISTORY — DX: Type 2 diabetes mellitus without complications: E11.9

## 2020-07-18 HISTORY — DX: Essential (primary) hypertension: I10

## 2020-07-18 HISTORY — DX: Unspecified osteoarthritis, unspecified site: M19.90

## 2020-07-18 HISTORY — DX: Unspecified atrial fibrillation: I48.91

## 2020-07-18 HISTORY — DX: Hyperlipidemia, unspecified: E78.5

## 2020-07-18 HISTORY — DX: Sick sinus syndrome: I49.5

## 2020-07-18 LAB — URINALYSIS, ROUTINE W REFLEX MICROSCOPIC
Bilirubin Urine: NEGATIVE
Glucose, UA: 150 mg/dL — AB
Hgb urine dipstick: NEGATIVE
Ketones, ur: NEGATIVE mg/dL
Leukocytes,Ua: NEGATIVE
Nitrite: NEGATIVE
Protein, ur: NEGATIVE mg/dL
Specific Gravity, Urine: 1.031 — ABNORMAL HIGH (ref 1.005–1.030)
pH: 5 (ref 5.0–8.0)

## 2020-07-18 LAB — RESP PANEL BY RT PCR (RSV, FLU A&B, COVID)
Influenza A by PCR: NEGATIVE
Influenza B by PCR: NEGATIVE
Respiratory Syncytial Virus by PCR: NEGATIVE
SARS Coronavirus 2 by RT PCR: NEGATIVE

## 2020-07-18 LAB — CBC WITH DIFFERENTIAL/PLATELET
Abs Immature Granulocytes: 0.04 10*3/uL (ref 0.00–0.07)
Basophils Absolute: 0 10*3/uL (ref 0.0–0.1)
Basophils Relative: 0 %
Eosinophils Absolute: 0.1 10*3/uL (ref 0.0–0.5)
Eosinophils Relative: 1 %
HCT: 35.1 % — ABNORMAL LOW (ref 36.0–46.0)
Hemoglobin: 10.4 g/dL — ABNORMAL LOW (ref 12.0–15.0)
Immature Granulocytes: 0 %
Lymphocytes Relative: 9 %
Lymphs Abs: 0.8 10*3/uL (ref 0.7–4.0)
MCH: 25.9 pg — ABNORMAL LOW (ref 26.0–34.0)
MCHC: 29.6 g/dL — ABNORMAL LOW (ref 30.0–36.0)
MCV: 87.5 fL (ref 80.0–100.0)
Monocytes Absolute: 0.8 10*3/uL (ref 0.1–1.0)
Monocytes Relative: 8 %
Neutro Abs: 8.1 10*3/uL — ABNORMAL HIGH (ref 1.7–7.7)
Neutrophils Relative %: 82 %
Platelets: 315 10*3/uL (ref 150–400)
RBC: 4.01 MIL/uL (ref 3.87–5.11)
RDW: 16.2 % — ABNORMAL HIGH (ref 11.5–15.5)
WBC: 9.9 10*3/uL (ref 4.0–10.5)
nRBC: 0 % (ref 0.0–0.2)

## 2020-07-18 LAB — COMPREHENSIVE METABOLIC PANEL
ALT: 35 U/L (ref 0–44)
AST: 22 U/L (ref 15–41)
Albumin: 2.9 g/dL — ABNORMAL LOW (ref 3.5–5.0)
Alkaline Phosphatase: 106 U/L (ref 38–126)
Anion gap: 5 (ref 5–15)
BUN: 22 mg/dL (ref 8–23)
CO2: 30 mmol/L (ref 22–32)
Calcium: 8.8 mg/dL — ABNORMAL LOW (ref 8.9–10.3)
Chloride: 107 mmol/L (ref 98–111)
Creatinine, Ser: 0.8 mg/dL (ref 0.44–1.00)
GFR, Estimated: >60 mL/min (ref >60)
Glucose, Bld: 183 mg/dL — ABNORMAL HIGH (ref 70–99)
Potassium: 4.6 mmol/L (ref 3.5–5.1)
Sodium: 142 mmol/L (ref 135–145)
Total Bilirubin: 0.9 mg/dL (ref 0.3–1.2)
Total Protein: 6.4 g/dL — ABNORMAL LOW (ref 6.5–8.1)

## 2020-07-18 LAB — BRAIN NATRIURETIC PEPTIDE: B Natriuretic Peptide: 375 pg/mL — ABNORMAL HIGH (ref 0.0–100.0)

## 2020-07-18 LAB — LIPASE, BLOOD: Lipase: 44 U/L (ref 11–51)

## 2020-07-18 MED ORDER — IOHEXOL 350 MG/ML SOLN
80.0000 mL | Freq: Once | INTRAVENOUS | Status: AC | PRN
  Administered 2020-07-18: 80 mL via INTRAVENOUS

## 2020-07-18 NOTE — Telephone Encounter (Signed)
Megan, I know you have already told the family that if the patient gets worse over the weekend that she needs to go to the ER, but please ensure that they understand reduced level of consciousness and responsiveness would also count as getting worse. So if there is any concern that she is not responding as well they should seriously consider emergent evaluation. It is hard for me to gauge how sick she may be as I have not been able to lay eyes on her. I just want the family to understand that "worse" can mean any of the following: spiking a fever, breathing quickly or too slowly, failing to respond/difficult to wake up, worsening confusion, high or low blood pressure (<50 systolically or >093 systolically), high or low heart rate (<50 or >100), reduced urinary output, low pulse oxygen saturation (<90%), etc. Please make sure they are aware of this

## 2020-07-18 NOTE — ED Notes (Signed)
Placed external catheter on patient.

## 2020-07-18 NOTE — ED Notes (Signed)
Patient transported to CT 

## 2020-07-18 NOTE — ED Provider Notes (Signed)
[facility] EMERGENCY DEPARTMENT Provider Note   CSN: [id number] Arrival date & time: 08/07/2020  1436     History Chief Complaint  Patient presents with  . Shortness of Breath    Patient complaining of SHOB and edema to BLE for the past two days. Recently discharged with new DX of CHF. Did not obtain RX for diuretic at discharge. Did not follow-up for this on Monday as planned. Reports weeping to BLE.     Amy Hoffman is a 79 y.o. female with PMh/o abnormal uterine bleeding, asthma, CM, HTN, HLD, dementia who presents for evaluation of SOB and abdominal pain. Patient states she is here because she her stomach was hurting earlier today. She does not think she was throwing up but can't specifically recall.   EM LEVEL 5 CAVEAT DUE TO DEMENTIA  The history is provided by the patient.       Past Medical History:  Diagnosis Date  . Abnormal uterine and vaginal bleeding, unspecified   . Allergic rhinitis due to pollen   . Anxiety   . Asthma   . Cancer [facility])    breast cancer - right  . Dementia ([place])   . Depression   . Diabetes mellitus without complication ([place])   . Essential (primary) hypertension   . Hyperlipidemia, unspecified   . Hypertension   . Major depressive disorder, single episode, unspecified   . Obesity, unspecified   . Pain in joint involving shoulder region   . Reflux esophagitis   . Trigger finger, acquired   . Type 2 diabetes mellitus with hyperglycemia ([place])   . Unspecified asthma with (acute) exacerbation   . 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 ([place])   . Unspecified osteoarthritis, unspecified site     Patient Active Problem List   Diagnosis Date Noted  . Bilateral pleural effusion 07/23/2020  . Mild protein malnutrition ([place]) 07/11/2020  . Failure to thrive in adult 07/10/2020  . Acute on chronic diastolic CHF ([place]) 16/60/6301  . Microcytic anemia 07/10/2020  . Anticoagulated 07/08/2020  . Tachycardia-bradycardia syndrome ([place])     . Acute lower UTI 07/01/2020  . Bradycardia 06/30/2020  . 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 Chronic constipation 04/23/2020  . Pleural effusion, bilateral 03/09/2020  . Chronic respiratory failure with hypoxia ([place]) 03/09/2020  . AKI (acute kidney injury) ([place]) 03/01/2020  . Type 2 diabetes mellitus ([place]) 02/26/2020  . 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 Hyperlipidemia associated with type 2 diabetes mellitus ([place]) 02/16/2020  . 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 Do not resuscitate status   . CHF (congestive heart failure) ([place]) 02/05/2020  . Hypokalemia   . Medicare annual wellness visit, subsequent   . Dementia with behavioral disturbance ([place])   . Paroxysmal atrial fibrillation ([place]) 12/16/2019  . Hyperlipidemia, unspecified   . Allergic rhinitis due to pollen   . Essential hypertension   . Major depressive disorder, single episode, unspecified   . Unspecified asthma, uncomplicated   . Dysphagia 07/10/2019  . Breast CA ([place]) 01/30/2014    Past Surgical History:  Procedure Laterality Date  . CHOLECYSTECTOMY    . MASTECTOMY Right   . RE-EXCISION OF BREAST LUMPECTOMY       OB History   No obstetric history on file.     Family History  Family history unknown: Yes    Social History   Tobacco Use  . Smoking status: Never Smoker  . Smokeless tobacco: Never  Used  Vaping Use  . Vaping Use: Never used  Substance Use Topics  . Alcohol use: No  . Drug use: No    Home Medications Prior to Admission medications   Medication Sig Start Date End Date Taking? Authorizing Provider  acetaminophen (TYLENOL) 325 MG tablet Take 2 tablets (650 mg total) by mouth every 6 (six) hours as needed for mild pain (or Fever >/= 101). 02/14/20   Roxan Hockey, MD  amiodarone (PACERONE) 200 MG tablet Take 1 tablet (200 mg total) by mouth daily. 06/27/20   Gerlene Fee, NP   amLODipine (NORVASC) 5 MG tablet Take 1 tablet (5 mg total) by mouth daily. 06/27/20   Gerlene Fee, NP  anastrozole (ARIMIDEX) 1 MG tablet Take 1 tablet (1 mg total) by mouth daily. 06/27/20   Gerlene Fee, NP  apixaban (ELIQUIS) 2.5 MG TABS tablet Take 1 tablet (2.5 mg total) by mouth 2 (two) times daily. 07/04/20   Nita Sells, MD  busPIRone (BUSPAR) 5 MG tablet Take 1 tablet (5 mg total) by mouth 2 (two) times daily. 06/27/20   Gerlene Fee, NP  feeding supplement, GLUCERNA SHAKE, (GLUCERNA SHAKE) LIQD Take 237 mLs by mouth 3 (three) times daily between meals. 07/13/20 08/12/20  Johnson, Clanford L, MD  simvastatin (ZOCOR) 20 MG tablet Take 1 tablet (20 mg total) by mouth daily. 06/27/20   Gerlene Fee, NP  sitaGLIPtin (JANUVIA) 100 MG tablet Take 1 tablet (100 mg total) by mouth daily. 06/27/20   Gerlene Fee, NP    Allergies    Codeine and Zantac [ranitidine hcl]  Review of Systems   Review of Systems  Unable to perform ROS: Dementia    Physical Exam Updated Vital Signs BP (!) 137/114   Pulse 97   Temp 97.7 F (36.5 C) (Oral)   Resp 19   Ht 5\' 5"  (1.651 m)   Wt 68 kg   SpO2 100%   BMI 24.95 kg/m   Physical Exam Vitals and nursing note reviewed.  Constitutional:      Appearance: Normal appearance. She is well-developed.     Comments: Frail and elderly appearing  HENT:     Head: Normocephalic and atraumatic.     Mouth/Throat:     Comments: Posterior oropharynx is clear without any signs of erythema, edema. Eyes:     General: Lids are normal.     Conjunctiva/sclera: Conjunctivae normal.     Pupils: Pupils are equal, round, and reactive to light.  Cardiovascular:     Rate and Rhythm: Normal rate and regular rhythm.     Pulses: Normal pulses.     Heart sounds: Normal heart sounds. No murmur heard.  No friction rub. No gallop.      Comments: 2+ pitting edema noted to BLE Pulmonary:     Effort: Pulmonary effort is normal. Tachypnea  present.     Comments: Decreased breath sounds noted to the left lower lung fields. Tachypnea noted.  Abdominal:     Palpations: Abdomen is soft. Abdomen is not rigid.     Tenderness: There is abdominal tenderness. There is no guarding.     Comments: Abdomen is soft, non-distended. Generalized tenderness noted. No rigidity, No guarding. No peritoneal signs.  Musculoskeletal:        General: Normal range of motion.     Cervical back: Full passive range of motion without pain.     Right lower leg: 2+ Edema present.     Left lower leg:  2+ Pitting Edema present.  Skin:    General: Skin is warm and dry.     Capillary Refill: Capillary refill takes less than 2 seconds.  Neurological:     Mental Status: She is alert and oriented to person, place, and time.  Psychiatric:        Speech: Speech normal.     ED Results / Procedures / Treatments   Labs (all labs ordered are listed, but only abnormal results are displayed) Labs Reviewed  COMPREHENSIVE METABOLIC PANEL - Abnormal; Notable for the following components:      Result Value   Glucose, Bld 183 (*)    Calcium 8.8 (*)    Total Protein 6.4 (*)    Albumin 2.9 (*)    All other components within normal limits  CBC WITH DIFFERENTIAL/PLATELET - Abnormal; Notable for the following components:   Hemoglobin 10.4 (*)    HCT 35.1 (*)    MCH 25.9 (*)    MCHC 29.6 (*)    RDW 16.2 (*)    Neutro Abs 8.1 (*)    All other components within normal limits  URINALYSIS, ROUTINE W REFLEX MICROSCOPIC - Abnormal; Notable for the following components:   Specific Gravity, Urine 1.031 (*)    Glucose, UA 150 (*)    All other components within normal limits  BRAIN NATRIURETIC PEPTIDE - Abnormal; Notable for the following components:   B Natriuretic Peptide 375.0 (*)    All other components within normal limits  RESP PANEL BY RT PCR (RSV, FLU A&B, COVID)  LIPASE, BLOOD    EKG None  Radiology CT Angio Chest PE W and/or Wo Contrast  Result Date:  08/12/2020 CLINICAL DATA:  Dementia patient with shortness of breath and abdominal pain. EXAM: CT ANGIOGRAPHY CHEST CT ABDOMEN AND PELVIS WITH CONTRAST TECHNIQUE: Multidetector CT imaging of the chest was performed using the standard protocol during bolus administration of intravenous contrast. Multiplanar CT image reconstructions and MIPs were obtained to evaluate the vascular anatomy. Multidetector CT imaging of the abdomen and pelvis was performed using the standard protocol during bolus administration of intravenous contrast. CONTRAST:  56mL OMNIPAQUE IOHEXOL 350 MG/ML SOLN COMPARISON:  Same day chest radiograph, CT chest dated 02/06/2020, CT abdomen pelvis dated 02/04/2019. FINDINGS: CTA CHEST FINDINGS Cardiovascular: Satisfactory opacification of the pulmonary arteries to the segmental level. No evidence of pulmonary embolism. Vascular calcifications are seen in the coronary arteries and aortic arch. There heart is enlarged and there is a moderate to large pericardial effusion. Mediastinum/Nodes: No enlarged mediastinal, hilar, or axillary lymph nodes. Aspirated debris is seen in the trachea (series 8 images 30 3-38). Thyroid gland and esophagus demonstrate no significant findings. Lungs/Pleura: There is a large left and a small right pleural effusion with associated atelectasis. There are mild scattered bilateral ground-glass opacities. There is no pneumothorax. Musculoskeletal: Degenerative changes are seen in the spine. Review of the MIP images confirms the above findings. CT ABDOMEN and PELVIS FINDINGS Hepatobiliary: No focal liver abnormality is seen. The patient is status post cholecystectomy. There is moderate intra and extrahepatic biliary ductal dilatation with the common bile duct measuring up to 17 mm in size. Pancreas: Unremarkable. No pancreatic ductal dilatation or surrounding inflammatory changes. Spleen: Normal in size without focal abnormality. Adrenals/Urinary Tract: Adrenal glands are  unremarkable. There is mild-to-moderate right hydronephrosis with a nondilated ureter which may reflect ureteropelvic junction obstruction. There is no hydronephrosis on the left. No renal calculi or focal renal lesion is identified on either side. Bladder is  unremarkable. Stomach/Bowel: Stomach is within normal limits. No pericecal inflammatory changes are noted to suggest acute appendicitis. There is colonic diverticulosis without evidence of diverticulitis. No evidence of bowel wall thickening, distention, or inflammatory changes. Vascular/Lymphatic: Aortic atherosclerosis. No enlarged abdominal or pelvic lymph nodes. Reproductive: Status post hysterectomy. No adnexal masses. Other: No abdominal wall hernia or abnormality. No abdominopelvic ascites. Anasarca is noted. Musculoskeletal: Degenerative changes are seen in the spine. There is 6 mm anterolisthesis of L4 on L5, not significantly changed since 12/05/2018. Review of the MIP images confirms the above findings. IMPRESSION: 1. No evidence of pulmonary embolism. 2. Large left and small right pleural effusions with associated atelectasis. 3. Mild scattered bilateral ground-glass opacities likely represent atelectasis and/or pneumonia. 4. Cardiomegaly with a moderate to large pericardial effusion. 5. Moderate intra and extrahepatic biliary ductal dilatation with the common bile duct measuring up to 17 mm in size. This may reflect post cholecystectomy state and patient age. Correlation with liver function tests is recommended. 6. Mild-to-moderate right hydronephrosis with a nondilated ureter which may reflect ureteropelvic junction obstruction. Aortic Atherosclerosis (ICD10-I70.0). Electronically Signed   By: Zerita Boers M.D.   On: 07/30/2020 18:08   CT ABDOMEN PELVIS W CONTRAST  Result Date: 08/07/2020 CLINICAL DATA:  Dementia patient with shortness of breath and abdominal pain. EXAM: CT ANGIOGRAPHY CHEST CT ABDOMEN AND PELVIS WITH CONTRAST TECHNIQUE:  Multidetector CT imaging of the chest was performed using the standard protocol during bolus administration of intravenous contrast. Multiplanar CT image reconstructions and MIPs were obtained to evaluate the vascular anatomy. Multidetector CT imaging of the abdomen and pelvis was performed using the standard protocol during bolus administration of intravenous contrast. CONTRAST:  28mL OMNIPAQUE IOHEXOL 350 MG/ML SOLN COMPARISON:  Same day chest radiograph, CT chest dated 02/06/2020, CT abdomen pelvis dated 02/04/2019. FINDINGS: CTA CHEST FINDINGS Cardiovascular: Satisfactory opacification of the pulmonary arteries to the segmental level. No evidence of pulmonary embolism. Vascular calcifications are seen in the coronary arteries and aortic arch. There heart is enlarged and there is a moderate to large pericardial effusion. Mediastinum/Nodes: No enlarged mediastinal, hilar, or axillary lymph nodes. Aspirated debris is seen in the trachea (series 8 images 30 3-38). Thyroid gland and esophagus demonstrate no significant findings. Lungs/Pleura: There is a large left and a small right pleural effusion with associated atelectasis. There are mild scattered bilateral ground-glass opacities. There is no pneumothorax. Musculoskeletal: Degenerative changes are seen in the spine. Review of the MIP images confirms the above findings. CT ABDOMEN and PELVIS FINDINGS Hepatobiliary: No focal liver abnormality is seen. The patient is status post cholecystectomy. There is moderate intra and extrahepatic biliary ductal dilatation with the common bile duct measuring up to 17 mm in size. Pancreas: Unremarkable. No pancreatic ductal dilatation or surrounding inflammatory changes. Spleen: Normal in size without focal abnormality. Adrenals/Urinary Tract: Adrenal glands are unremarkable. There is mild-to-moderate right hydronephrosis with a nondilated ureter which may reflect ureteropelvic junction obstruction. There is no hydronephrosis on  the left. No renal calculi or focal renal lesion is identified on either side. Bladder is unremarkable. Stomach/Bowel: Stomach is within normal limits. No pericecal inflammatory changes are noted to suggest acute appendicitis. There is colonic diverticulosis without evidence of diverticulitis. No evidence of bowel wall thickening, distention, or inflammatory changes. Vascular/Lymphatic: Aortic atherosclerosis. No enlarged abdominal or pelvic lymph nodes. Reproductive: Status post hysterectomy. No adnexal masses. Other: No abdominal wall hernia or abnormality. No abdominopelvic ascites. Anasarca is noted. Musculoskeletal: Degenerative changes are seen in the spine. There is  6 mm anterolisthesis of L4 on L5, not significantly changed since 12/05/2018. Review of the MIP images confirms the above findings. IMPRESSION: 1. No evidence of pulmonary embolism. 2. Large left and small right pleural effusions with associated atelectasis. 3. Mild scattered bilateral ground-glass opacities likely represent atelectasis and/or pneumonia. 4. Cardiomegaly with a moderate to large pericardial effusion. 5. Moderate intra and extrahepatic biliary ductal dilatation with the common bile duct measuring up to 17 mm in size. This may reflect post cholecystectomy state and patient age. Correlation with liver function tests is recommended. 6. Mild-to-moderate right hydronephrosis with a nondilated ureter which may reflect ureteropelvic junction obstruction. Aortic Atherosclerosis (ICD10-I70.0). Electronically Signed   By: Zerita Boers M.D.   On: 07/23/2020 18:08   DG Chest Port 1 View  Result Date: 07/17/2020 CLINICAL DATA:  Shortness of breath. EXAM: PORTABLE CHEST 1 VIEW COMPARISON:  Radiograph 07/11/2020.  CT 02/06/2020 FINDINGS: Stable cardiomegaly. Unchanged mediastinal contours. Bilateral pleural effusions, not significantly changed on the right and increased on the left. Increased left basilar airspace disease typically  atelectasis. Vascular congestion is similar. There is no pneumothorax. Stable osseous structures. Surgical clips in the right axilla. IMPRESSION: 1. Increased left pleural effusion with progressive left basilar airspace disease over the past week, typically atelectasis. 2. No significant change in right pleural effusion. 3. Stable cardiomegaly and vascular congestion. Electronically Signed   By: Keith Rake M.D.   On: 07/17/2020 16:03    Procedures Procedures (including critical care time)  Medications Ordered in ED Medications  iohexol (OMNIPAQUE) 350 MG/ML injection 80 mL (80 mLs Intravenous Contrast Given 07/22/2020 1730)    ED Course  I have reviewed the triage vital signs and the nursing notes.  Pertinent labs & imaging results that were available during my care of the patient were reviewed by me and considered in my medical decision making (see chart for details).    MDM Rules/Calculators/A&P                          79 year old female with past medical history of abnormal uterine bleeding, asthma, CM, HTN, HLD, dementia presents today for concerns for shortness of breath abdominal pain.  Unfortunately, patient with history of dementia and is a very poor historian.  EMS reported that patient been complaining of some shortness of breath and edema to bilateral lower extremities.  Patient states she has been having some abdominal pain but does not think she has been vomiting.  On initially arrival, she is afebrile, tachypneic but in no acute distress.  She thinks she is on oxygen but does not know how much she is on or requires.  On exam, she has 2+ pitting edema in bilateral lower extremities.  She has some decreased breath sounds in the left lower lung fields.  She also has some generalized abdominal tenderness with no focal point.  No rigidity, guarding.  Will obtain blood work and attempt to call family.  I attempted to call her daughter x2 as well as her husband but was unable to get  any answer.  I reviewed previous notes.  Patient had had home health care placement after discharge on 07/13/2020.  They were concerned about some worsening shortness of breath and had requested that a chest x-ray ordered.  I was able to contact patient's daughter Tomma Lightning). She states that patient was recently discharged from the hospital and they have been working with homehealth and palliative care. One of the home health  nurses noted that patient was having a little bit more trouble breathing and thought she may have heard some wheezing which is why they contacted the provider about potentially getting a CXR done. Additionally, patient wears oxygen PRN and they had noted that her O2 sats had dropped as low as the 70s in the last few days so she had to wear her oxygen more consistently. The nurse was also wondering if maybe she had thrush inside her mouth. Patient was not wanting to eat because her throat hurt. Additionally, there was some question if patient had a UTI. Daughter states she hadn't known about patient complaining of any abdominal pain.   Covid negative.  Lipase unremarkable.  BNP is 375.0.  BUN and creatinine within normal limits.  CBC shows no leukocytosis.  Hemoglobin is 10.4. CXR shows increased left pleural effusion with progressive left basilar airspace disease over the past week. No significant change in right pleural effusion. Given that patient has been more hypoxic and SOB and has had a recent admission, will plan for CTA of chest to eval for PE. Additionally will oibtain a CT abd/pelvis to evaluate for any intra-abdominal pathology.  UA negative for any infectious etiology.   CTA of chest and CT abd/pelvis show no evidence of PE. There is large left and small right pleural effusions with associated atelectasis. She has mild scattered bilateral groundglass opacities. Cardiomegaly with moderate to large pericardial effusion. She has moderate intra and extrahepatic biliary  ductal dilatation with the common bile duct measuring up to 17 mm. There is mild to moderate right hydronephrosis with a nondilated ureter which may reflect ureteropelvic junction obstruction.  Discussed patient with Dr. Margaretann Loveless (cardiology) regarding patient's pericardial effusion.  At this time, patient is hemodynamically stable.  She doubts that this is cardiac tamponade given patient's overall hemodynamic stability.  She feels that if patient is stable appearing, it may be reasonable for her to keep her overnight any pain have cardiology see her in the morning with a stat echo in the morning.  If she changes overnight, they can consult cardiology back and can discussed transfer.  She does state that the only reason that she transfers if she would need an emergent pericardiocentesis.  She recommends discussing with family to see if this is something that they would want.  I attempted to call daughter back to discuss with her results but was unable to get in contact with anybody.  I am unable to contact her husband.  At this time patient with continued increase in her oxygen requirement as well as shortness of breath.  Feel like she would most likely benefit from thoracentesis during mission.  Additionally, she will need an echo for her pericardial effusion seen today.  We will plan for admission.  Discussed patient with Dr. Josephine Cables (hospitalist) who accepts patient for admission.   Portions of this note were generated with [employer]. Dictation errors may occur despite best attempts at proofreading.   Final Clinical Impression(s) / ED Diagnoses Final diagnoses:  Pericardial effusion  Pleural effusion  Hypoxia    Rx / DC Orders ED Discharge Orders    None       Desma Mcgregor 07/17/2020 2336    Milton Ferguson, MD 07/19/20 2236

## 2020-07-18 NOTE — Progress Notes (Signed)
Was contacted at 7:46 pm by Ria Comment PA at [place] about Ms. Shibley. She has a new oxygen requirement, 3 L. CT chest showed moderate-large pericardial effusion and pleural effusion. Patient vitals reported by PA are HR 90 and BP 931P-216K systolic.   Patient is DNR and palliative care per report.   Based on current vitals and description of clinical status, patient is reportedly stable. I recommended calling the cardiology fellow on call if patient deteriorates, however a conversation about goals of treatment should be had prior to transferring for pericardiocentesis, if that is in line with patient's wishes. Recommend echocardiogram in AM and can review with cardiology.

## 2020-07-18 NOTE — Telephone Encounter (Signed)
Drusilla Kanner at [phone] and gave her the verbal OK per Dr. Anastasio Champion.   Bethena Roys verbalized an understanding.

## 2020-07-18 NOTE — Telephone Encounter (Signed)
Amy Hoffman from [facility] called today and left a detailed VM to request verbal orders for a mobile xray for patient. I contacted Amy Hoffman and she gave the verbal OK for this order. Amy Hoffman stated that the family did not think they could get the patient to Amy Hoffman to have this done but they may try to take her and have her labs done also. Husband brought the urine by today and this has been sent to the lab.  Amy Hoffman did not know the O2 Sat and will check and call us back.  Family aware to proceed to ER if condition worsens.   Dr. Anastasio Champion, please keep an eye out for lab results and/or xray results for patient as Amy Hoffman will try to check also. Thank you!

## 2020-07-18 NOTE — Telephone Encounter (Signed)
Sure, that would be fine.

## 2020-07-18 NOTE — Telephone Encounter (Signed)
Shirlee Latch from Speech Therapy called and left a detailed voice message requesting to push a visit to next week due to visit today and patient was very sleepy and not responding well and therapist was not sure if due to not feeling well. Therapist did state that the patient looked like she had thrush in her mouth.  Also OK to put OT and PT back to next week due to patient not able to complete therapy?  Please advise.

## 2020-07-18 NOTE — H&P (Signed)
History and Physical  Amy Hoffman STM:[id number] DOB: Aug 02, 1941 DOA: 07/20/2020  Referring physician: Volanda Napoleon, PA-C PCP: [name], NP  Patient coming from: Home  Chief Complaint: Shortness of breath  HPI: Amy Hoffman is a 79 y.o. female with medical history significant for atrial fibrillation, diastolic CHF, abnormal uterine bleeding, allergic rhinitis, anxiety, depression, dementia, right breast cancer, type 2 diabetes mellitus, hypertension, hyperlipidemia, obesity, GERD and osteoarthritis who presents to the emergency department due to 2 day onset of increased shortness of breath.  Patient was unable to provide a history possibly due to underlying dementia and confusion, history was obtained from ED PA and ED medical record.  Per report, patient was complaining of shortness of breath and increased bilateral leg swelling since last 2 days, she also complained of abdominal pain which started earlier today and she did not think that she vomited, though cannot specifically recall. Patient was recently admitted from 10/27-10/30 due to failure to thrive in adult and chronic hypoxic respiratory failure as well as acute kidney injury on CKD stage IIIb. At bedside, patient states that she does not know why she was brought to the ED.  ED Course:  In the emergency department, patient was hemodynamically stable and O2 sat was 95-100% on 3 LPM of oxygen.  Work-up in the ED showed normocytic anemia, hyperglycemia, urinalysis was negative, BNP 375 and hypoalbuminemia.  CT angiography of chest showed no evidence of pulmonary embolism, but showed large left and small right pleural effusions with associated atelectasis and mild scattered bilateral groundglass opacities which likely represent atelectasis and/or pneumonia.  Cardiomegaly with a moderate to large pericardial effusion was also noted.  CT abdomen and pelvis showed moderate intra and extrahepatic biliary ductal dilatation with a  common bile duct measuring up to 17 mm in size, mild to moderate right hydronephrosis with a nondilated ureter which may reflect ureteropelvic junction obstruction noted.  Chest x-ray showed increased left pleural effusion with progressive left basilar airspace disease, typically atelectasis and no significant change in right pleural effusion noted.  Cardiologist (Dr. Margaretann Loveless) was consulted by ED PA who recommended admitting patient here at any pain since patient was reportedly stable and to consult with cardiology fellow on call if patient deteriorates overnight.  Hospitalist was asked to admit patient for further evaluation and management.  Review of Systems: This cannot be obtained at this time due to patient's current condition  Past Medical History:  Diagnosis Date  . Abnormal uterine and vaginal bleeding, unspecified   . Allergic rhinitis due to pollen   . Anxiety   . Asthma   . Cancer [facility])    breast cancer - right  . Dementia ([place])   . Depression   . Diabetes mellitus without complication ([place])   . Essential (primary) hypertension   . Hyperlipidemia, unspecified   . Hypertension   . Major depressive disorder, single episode, unspecified   . Obesity, unspecified   . Pain in joint involving shoulder region   . Reflux esophagitis   . Trigger finger, acquired   . Type 2 diabetes mellitus with hyperglycemia ([place])   . Unspecified asthma with (acute) exacerbation   . 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 ([place])   . Unspecified osteoarthritis, unspecified site    Past Surgical History:  Procedure Laterality Date  . CHOLECYSTECTOMY    . MASTECTOMY Right   . RE-EXCISION OF BREAST LUMPECTOMY      Social History:  reports that she has never smoked. She has never  used smokeless tobacco. She reports that she does not drink alcohol and does not use drugs.   Allergies  Allergen Reactions  . Codeine   . Zantac [Ranitidine Hcl]     Family History  Family history  unknown: Yes    Prior to Admission medications   Medication Sig Start Date End Date Taking? Authorizing Provider  acetaminophen (TYLENOL) 325 MG tablet Take 2 tablets (650 mg total) by mouth every 6 (six) hours as needed for mild pain (or Fever >/= 101). 02/14/20   Roxan Hockey, MD  amiodarone (PACERONE) 200 MG tablet Take 1 tablet (200 mg total) by mouth daily. 06/27/20   Gerlene Fee, NP  amLODipine (NORVASC) 5 MG tablet Take 1 tablet (5 mg total) by mouth daily. 06/27/20   Gerlene Fee, NP  anastrozole (ARIMIDEX) 1 MG tablet Take 1 tablet (1 mg total) by mouth daily. 06/27/20   Gerlene Fee, NP  apixaban (ELIQUIS) 2.5 MG TABS tablet Take 1 tablet (2.5 mg total) by mouth 2 (two) times daily. 07/04/20   Nita Sells, MD  busPIRone (BUSPAR) 5 MG tablet Take 1 tablet (5 mg total) by mouth 2 (two) times daily. 06/27/20   Gerlene Fee, NP  feeding supplement, GLUCERNA SHAKE, (GLUCERNA SHAKE) LIQD Take 237 mLs by mouth 3 (three) times daily between meals. 07/13/20 08/12/20  Johnson, Clanford L, MD  simvastatin (ZOCOR) 20 MG tablet Take 1 tablet (20 mg total) by mouth daily. 06/27/20   Gerlene Fee, NP  sitaGLIPtin (JANUVIA) 100 MG tablet Take 1 tablet (100 mg total) by mouth daily. 06/27/20   Gerlene Fee, NP    Physical Exam: BP (!) 137/114   Pulse 97   Temp 97.7 F (36.5 C) (Oral)   Resp 19   Ht 5\' 5"  (1.651 m)   Wt 68 kg   SpO2 100%   BMI 24.95 kg/m   . General: 79 y.o. year-old female with normal appearance and in no acute distress.   Marland Kitchen HEENT: NCAT, EOMI . Neck: Supple, trachea medial . Cardiovascular: Regular rate and rhythm with no rubs or gallops.  No thyromegaly or JVD noted. 2/4 pulses in all 4 extremities. Marland Kitchen Respiratory: Decreased breath sounds in LLL.  No wheezing.   . Abdomen: Soft nontender nondistended with normal bowel sounds x4 quadrants. . Muskuloskeletal: Bilateral lower extremity edema.  No cyanosis or clubbing noted  bilaterally . Neuro: CN II-XII intact, strength, sensation, reflexes . Skin: No ulcerative lesions noted or rashes . Psychiatry: Judgement and insight appear normal. Mood is appropriate for condition and setting          Labs on Admission:  Basic Metabolic Panel: Recent Labs  Lab 07/12/20 0441 07/13/20 0609 07/22/2020 1500  NA 138 139 142  K 4.2 5.1 4.6  CL 105 106 107  CO2 23 24 30   GLUCOSE 124* 88 183*  BUN 43* 50* 22  CREATININE 1.10* 1.37* 0.80  CALCIUM 8.2* 8.5* 8.8*  MG 2.3  --   --    Liver Function Tests: Recent Labs  Lab 07/23/2020 1500  AST 22  ALT 35  ALKPHOS 106  BILITOT 0.9  PROT 6.4*  ALBUMIN 2.9*   Recent Labs  Lab 07/29/2020 1500  LIPASE 44   No results for input(s): AMMONIA in the last 168 hours. CBC: Recent Labs  Lab 07/12/20 0441 08/05/2020 1500  WBC 11.7* 9.9  NEUTROABS  --  8.1*  HGB 9.6* 10.4*  HCT 33.3* 35.1*  MCV 89.5 87.5  PLT 358 315   Cardiac Enzymes: No results for input(s): CKTOTAL, CKMB, CKMBINDEX, TROPONINI in the last 168 hours.  BNP (last 3 results) Recent Labs    06/30/20 1443 07/11/20 0005 08/06/2020 1500  BNP 1,208.0* 92.0 375.0*    ProBNP (last 3 results) No results for input(s): PROBNP in the last 8760 hours.  CBG: Recent Labs  Lab 07/13/20 1001 07/13/20 1113 07/13/20 1618 07/13/20 1646 07/13/20 1716  GLUCAP 163* 148* 34* 54* 86    Radiological Exams on Admission: CT Angio Chest PE W and/or Wo Contrast  Result Date: 08/06/2020 CLINICAL DATA:  Dementia patient with shortness of breath and abdominal pain. EXAM: CT ANGIOGRAPHY CHEST CT ABDOMEN AND PELVIS WITH CONTRAST TECHNIQUE: Multidetector CT imaging of the chest was performed using the standard protocol during bolus administration of intravenous contrast. Multiplanar CT image reconstructions and MIPs were obtained to evaluate the vascular anatomy. Multidetector CT imaging of the abdomen and pelvis was performed using the standard protocol during bolus  administration of intravenous contrast. CONTRAST:  23mL OMNIPAQUE IOHEXOL 350 MG/ML SOLN COMPARISON:  Same day chest radiograph, CT chest dated 02/06/2020, CT abdomen pelvis dated 02/04/2019. FINDINGS: CTA CHEST FINDINGS Cardiovascular: Satisfactory opacification of the pulmonary arteries to the segmental level. No evidence of pulmonary embolism. Vascular calcifications are seen in the coronary arteries and aortic arch. There heart is enlarged and there is a moderate to large pericardial effusion. Mediastinum/Nodes: No enlarged mediastinal, hilar, or axillary lymph nodes. Aspirated debris is seen in the trachea (series 8 images 30 3-38). Thyroid gland and esophagus demonstrate no significant findings. Lungs/Pleura: There is a large left and a small right pleural effusion with associated atelectasis. There are mild scattered bilateral ground-glass opacities. There is no pneumothorax. Musculoskeletal: Degenerative changes are seen in the spine. Review of the MIP images confirms the above findings. CT ABDOMEN and PELVIS FINDINGS Hepatobiliary: No focal liver abnormality is seen. The patient is status post cholecystectomy. There is moderate intra and extrahepatic biliary ductal dilatation with the common bile duct measuring up to 17 mm in size. Pancreas: Unremarkable. No pancreatic ductal dilatation or surrounding inflammatory changes. Spleen: Normal in size without focal abnormality. Adrenals/Urinary Tract: Adrenal glands are unremarkable. There is mild-to-moderate right hydronephrosis with a nondilated ureter which may reflect ureteropelvic junction obstruction. There is no hydronephrosis on the left. No renal calculi or focal renal lesion is identified on either side. Bladder is unremarkable. Stomach/Bowel: Stomach is within normal limits. No pericecal inflammatory changes are noted to suggest acute appendicitis. There is colonic diverticulosis without evidence of diverticulitis. No evidence of bowel wall thickening,  distention, or inflammatory changes. Vascular/Lymphatic: Aortic atherosclerosis. No enlarged abdominal or pelvic lymph nodes. Reproductive: Status post hysterectomy. No adnexal masses. Other: No abdominal wall hernia or abnormality. No abdominopelvic ascites. Anasarca is noted. Musculoskeletal: Degenerative changes are seen in the spine. There is 6 mm anterolisthesis of L4 on L5, not significantly changed since 12/05/2018. Review of the MIP images confirms the above findings. IMPRESSION: 1. No evidence of pulmonary embolism. 2. Large left and small right pleural effusions with associated atelectasis. 3. Mild scattered bilateral ground-glass opacities likely represent atelectasis and/or pneumonia. 4. Cardiomegaly with a moderate to large pericardial effusion. 5. Moderate intra and extrahepatic biliary ductal dilatation with the common bile duct measuring up to 17 mm in size. This may reflect post cholecystectomy state and patient age. Correlation with liver function tests is recommended. 6. Mild-to-moderate right hydronephrosis with a nondilated ureter which may reflect ureteropelvic junction obstruction. Aortic Atherosclerosis (ICD10-I70.0).  Electronically Signed   By: Zerita Boers M.D.   On: 07/21/2020 18:08   CT ABDOMEN PELVIS W CONTRAST  Result Date: 07/22/2020 CLINICAL DATA:  Dementia patient with shortness of breath and abdominal pain. EXAM: CT ANGIOGRAPHY CHEST CT ABDOMEN AND PELVIS WITH CONTRAST TECHNIQUE: Multidetector CT imaging of the chest was performed using the standard protocol during bolus administration of intravenous contrast. Multiplanar CT image reconstructions and MIPs were obtained to evaluate the vascular anatomy. Multidetector CT imaging of the abdomen and pelvis was performed using the standard protocol during bolus administration of intravenous contrast. CONTRAST:  8mL OMNIPAQUE IOHEXOL 350 MG/ML SOLN COMPARISON:  Same day chest radiograph, CT chest dated 02/06/2020, CT abdomen pelvis  dated 02/04/2019. FINDINGS: CTA CHEST FINDINGS Cardiovascular: Satisfactory opacification of the pulmonary arteries to the segmental level. No evidence of pulmonary embolism. Vascular calcifications are seen in the coronary arteries and aortic arch. There heart is enlarged and there is a moderate to large pericardial effusion. Mediastinum/Nodes: No enlarged mediastinal, hilar, or axillary lymph nodes. Aspirated debris is seen in the trachea (series 8 images 30 3-38). Thyroid gland and esophagus demonstrate no significant findings. Lungs/Pleura: There is a large left and a small right pleural effusion with associated atelectasis. There are mild scattered bilateral ground-glass opacities. There is no pneumothorax. Musculoskeletal: Degenerative changes are seen in the spine. Review of the MIP images confirms the above findings. CT ABDOMEN and PELVIS FINDINGS Hepatobiliary: No focal liver abnormality is seen. The patient is status post cholecystectomy. There is moderate intra and extrahepatic biliary ductal dilatation with the common bile duct measuring up to 17 mm in size. Pancreas: Unremarkable. No pancreatic ductal dilatation or surrounding inflammatory changes. Spleen: Normal in size without focal abnormality. Adrenals/Urinary Tract: Adrenal glands are unremarkable. There is mild-to-moderate right hydronephrosis with a nondilated ureter which may reflect ureteropelvic junction obstruction. There is no hydronephrosis on the left. No renal calculi or focal renal lesion is identified on either side. Bladder is unremarkable. Stomach/Bowel: Stomach is within normal limits. No pericecal inflammatory changes are noted to suggest acute appendicitis. There is colonic diverticulosis without evidence of diverticulitis. No evidence of bowel wall thickening, distention, or inflammatory changes. Vascular/Lymphatic: Aortic atherosclerosis. No enlarged abdominal or pelvic lymph nodes. Reproductive: Status post hysterectomy. No  adnexal masses. Other: No abdominal wall hernia or abnormality. No abdominopelvic ascites. Anasarca is noted. Musculoskeletal: Degenerative changes are seen in the spine. There is 6 mm anterolisthesis of L4 on L5, not significantly changed since 12/05/2018. Review of the MIP images confirms the above findings. IMPRESSION: 1. No evidence of pulmonary embolism. 2. Large left and small right pleural effusions with associated atelectasis. 3. Mild scattered bilateral ground-glass opacities likely represent atelectasis and/or pneumonia. 4. Cardiomegaly with a moderate to large pericardial effusion. 5. Moderate intra and extrahepatic biliary ductal dilatation with the common bile duct measuring up to 17 mm in size. This may reflect post cholecystectomy state and patient age. Correlation with liver function tests is recommended. 6. Mild-to-moderate right hydronephrosis with a nondilated ureter which may reflect ureteropelvic junction obstruction. Aortic Atherosclerosis (ICD10-I70.0). Electronically Signed   By: Zerita Boers M.D.   On: 07/16/2020 18:08   DG Chest Port 1 View  Result Date: 08/13/2020 CLINICAL DATA:  Shortness of breath. EXAM: PORTABLE CHEST 1 VIEW COMPARISON:  Radiograph 07/11/2020.  CT 02/06/2020 FINDINGS: Stable cardiomegaly. Unchanged mediastinal contours. Bilateral pleural effusions, not significantly changed on the right and increased on the left. Increased left basilar airspace disease typically atelectasis. Vascular congestion is similar. There  is no pneumothorax. Stable osseous structures. Surgical clips in the right axilla. IMPRESSION: 1. Increased left pleural effusion with progressive left basilar airspace disease over the past week, typically atelectasis. 2. No significant change in right pleural effusion. 3. Stable cardiomegaly and vascular congestion. Electronically Signed   By: Keith Rake M.D.   On: 07/23/2020 16:03    EKG: I independently viewed the EKG done and my findings are  as followed: Atrial fibrillation with RVR and prolonged QTc (563ms)  Assessment/Plan Present on Admission: . Bilateral pleural effusion . Essential hypertension . Hyperlipidemia associated with type 2 diabetes mellitus ([place])  Principal Problem:   Acute respiratory failure with hypoxia (HCC) Active Problems:   Essential hypertension   CHF (congestive heart failure) (HCC)   Hyperlipidemia associated with type 2 diabetes mellitus (HCC)   Bilateral pleural effusion   Hypoalbuminemia   Prolonged QT interval   Pericardial effusion   Hydronephrosis   Hyperglycemia due to diabetes mellitus (HCC)   Elevated brain natriuretic peptide (BNP) level  Acute on chronic respiratory failure with hypoxia possibly secondary to bilateral pleural effusion and left-sided atelectasis CT angiography of chest ruled out pulmonary embolism, but showed bilateral pleural effusion (L > R) IR will be consulted for possible thoracentesis CT was suggestive of atelectasis/pneumonia (patient was afebrile and presents with no cough or chest congestion).  Procalcitonin will be checked prior to starting antibiotics Continue incentive spirometry Continue supplemental oxygen with plan to wean patient off supplemental oxygen via [place] as tolerated  Pericardial effusion CT angiography of chest showed cardiomegaly with a moderate to large pericardial effusion  Cardiology (Dr. Margaretann Loveless) was consulted and recommended monitoring patient overnight since she was stable and do an echocardiogram and consult cardiology in the morning  Hydronephrosis CT abdomen and pelvis showed mild to moderate right hydronephrosis with a nondilated ureter which may reflect ureteropelvic junction obstruction  Patient did complain of some abdominal discomfort earlier today Urology will be consulted for further evaluation and recommendation.  Prolonged QTc QTc 516ms Avoid QT prolonging drugs Magnesium level will be checked Continue telemetry  A.  fib with RVR CHADsVASc Score of at least 6  Continue home Eliquis Amiodarone will be temporarily held due to prolonged QT interval  Hyperglycemia secondary to type 2 diabetes mellitus Continue insulin sliding scale and hypoglycemia protocol  Hyperlipidemia Continue simvastatin per home regimen  Essential hypertension (controlled) Amlodipine will be temporarily held at this time due to soft BP  CHF/elevated BNP BNP 375; this was 1,208 about 2 weeks ago Continue total input/output, daily weights and fluid restriction Last echocardiogram done on 12/17/2019 showed LVEF of 50-55% Echocardiogram will be repeated in the morning  DVT prophylaxis: Eliquis  Code Status: DNR  Family Communication: None at bedside  Disposition Plan:  Patient is from:                        home Anticipated DC to:                   SNF or family members home Anticipated DC date:               2-3 days Anticipated DC barriers:           Patient is unstable to be discharged home at this time due to increased shortness of breath in the setting of bilateral pleural effusion (L>R) and pericardial effusion that requires IR and cardiology consult and recommendation.   Consults called: IR, cardiology, urology  Admission status: Inpatient    Bernadette Hoit MD [facility]  07/19/2020, 1:21 AM

## 2020-07-19 ENCOUNTER — Other Ambulatory Visit (INDEPENDENT_AMBULATORY_CARE_PROVIDER_SITE_OTHER): Payer: Self-pay | Admitting: Nurse Practitioner

## 2020-07-19 ENCOUNTER — Inpatient Hospital Stay (HOSPITAL_COMMUNITY): Payer: Medicare Other

## 2020-07-19 ENCOUNTER — Other Ambulatory Visit: Payer: Self-pay

## 2020-07-19 DIAGNOSIS — E8809 Other disorders of plasma-protein metabolism, not elsewhere classified: Secondary | ICD-10-CM

## 2020-07-19 DIAGNOSIS — J9601 Acute respiratory failure with hypoxia: Secondary | ICD-10-CM | POA: Diagnosis not present

## 2020-07-19 DIAGNOSIS — I313 Pericardial effusion (noninflammatory): Principal | ICD-10-CM

## 2020-07-19 DIAGNOSIS — N1330 Unspecified hydronephrosis: Secondary | ICD-10-CM

## 2020-07-19 DIAGNOSIS — E1165 Type 2 diabetes mellitus with hyperglycemia: Secondary | ICD-10-CM

## 2020-07-19 DIAGNOSIS — N390 Urinary tract infection, site not specified: Secondary | ICD-10-CM

## 2020-07-19 DIAGNOSIS — I3139 Other pericardial effusion (noninflammatory): Secondary | ICD-10-CM

## 2020-07-19 DIAGNOSIS — J9811 Atelectasis: Secondary | ICD-10-CM

## 2020-07-19 DIAGNOSIS — R7989 Other specified abnormal findings of blood chemistry: Secondary | ICD-10-CM

## 2020-07-19 DIAGNOSIS — R9431 Abnormal electrocardiogram [ECG] [EKG]: Secondary | ICD-10-CM

## 2020-07-19 LAB — COMPREHENSIVE METABOLIC PANEL
ALT: 33 U/L (ref 0–44)
AST: 20 U/L (ref 15–41)
Albumin: 2.7 g/dL — ABNORMAL LOW (ref 3.5–5.0)
Alkaline Phosphatase: 95 U/L (ref 38–126)
Anion gap: 8 (ref 5–15)
BUN: 18 mg/dL (ref 8–23)
CO2: 29 mmol/L (ref 22–32)
Calcium: 8.9 mg/dL (ref 8.9–10.3)
Chloride: 108 mmol/L (ref 98–111)
Creatinine, Ser: 0.7 mg/dL (ref 0.44–1.00)
GFR, Estimated: >60 mL/min (ref >60)
Glucose, Bld: 167 mg/dL — ABNORMAL HIGH (ref 70–99)
Potassium: 4.8 mmol/L (ref 3.5–5.1)
Sodium: 145 mmol/L (ref 135–145)
Total Bilirubin: 0.7 mg/dL (ref 0.3–1.2)
Total Protein: 6.1 g/dL — ABNORMAL LOW (ref 6.5–8.1)

## 2020-07-19 LAB — ECHOCARDIOGRAM COMPLETE
AR max vel: 1.44 cm2
AV Area VTI: 1.43 cm2
AV Area mean vel: 1.4 cm2
AV Mean grad: 4.1 mmHg
AV Peak grad: 6.6 mmHg
Ao pk vel: 1.29 m/s
Area-P 1/2: 2.9 cm2
Height: 65 in
S' Lateral: 3.08 cm
Weight: 2398.6 oz

## 2020-07-19 LAB — CBC
HCT: 35.7 % — ABNORMAL LOW (ref 36.0–46.0)
Hemoglobin: 10 g/dL — ABNORMAL LOW (ref 12.0–15.0)
MCH: 25.4 pg — ABNORMAL LOW (ref 26.0–34.0)
MCHC: 28 g/dL — ABNORMAL LOW (ref 30.0–36.0)
MCV: 90.6 fL (ref 80.0–100.0)
Platelets: 330 10*3/uL (ref 150–400)
RBC: 3.94 MIL/uL (ref 3.87–5.11)
RDW: 16.3 % — ABNORMAL HIGH (ref 11.5–15.5)
WBC: 9.5 10*3/uL (ref 4.0–10.5)
nRBC: 0 % (ref 0.0–0.2)

## 2020-07-19 LAB — MAGNESIUM: Magnesium: 2 mg/dL (ref 1.7–2.4)

## 2020-07-19 LAB — PROTIME-INR
INR: 1.3 — ABNORMAL HIGH (ref 0.8–1.2)
Prothrombin Time: 15.6 seconds — ABNORMAL HIGH (ref 11.4–15.2)

## 2020-07-19 LAB — CBG MONITORING, ED
Glucose-Capillary: 139 mg/dL — ABNORMAL HIGH (ref 70–99)
Glucose-Capillary: 133 mg/dL — ABNORMAL HIGH (ref 70–99)
Glucose-Capillary: 89 mg/dL (ref 70–99)
Glucose-Capillary: 96 mg/dL (ref 70–99)
Glucose-Capillary: 99 mg/dL (ref 70–99)

## 2020-07-19 LAB — APTT: aPTT: 38 seconds — ABNORMAL HIGH (ref 24–36)

## 2020-07-19 LAB — GLUCOSE, CAPILLARY
Glucose-Capillary: 104 mg/dL — ABNORMAL HIGH (ref 70–99)
Glucose-Capillary: 115 mg/dL — ABNORMAL HIGH (ref 70–99)

## 2020-07-19 LAB — PROCALCITONIN: Procalcitonin: <0.1 ng/mL

## 2020-07-19 LAB — PHOSPHORUS: Phosphorus: 2.9 mg/dL (ref 2.5–4.6)

## 2020-07-19 MED ORDER — NYSTATIN 100000 UNIT/ML MT SUSP
5.0000 mL | Freq: Four times a day (QID) | OROMUCOSAL | Status: DC
  Administered 2020-07-19 – 2020-07-24 (×8): 500000 [IU] via ORAL
  Filled 2020-07-19 (×11): qty 5

## 2020-07-19 MED ORDER — SODIUM CHLORIDE 0.9 % IV SOLN
1.0000 g | INTRAVENOUS | Status: AC
  Administered 2020-07-19 – 2020-07-21 (×3): 1 g via INTRAVENOUS
  Filled 2020-07-19 (×3): qty 10

## 2020-07-19 MED ORDER — TAMSULOSIN HCL 0.4 MG PO CAPS
0.4000 mg | ORAL_CAPSULE | Freq: Every day | ORAL | Status: DC
  Administered 2020-07-19 – 2020-07-21 (×3): 0.4 mg via ORAL
  Filled 2020-07-19 (×3): qty 1

## 2020-07-19 MED ORDER — CHLORHEXIDINE GLUCONATE CLOTH 2 % EX PADS
6.0000 | MEDICATED_PAD | Freq: Every day | CUTANEOUS | Status: DC
  Administered 2020-07-19 – 2020-07-23 (×2): 6 via TOPICAL

## 2020-07-19 MED ORDER — INSULIN ASPART 100 UNIT/ML ~~LOC~~ SOLN
0.0000 [IU] | SUBCUTANEOUS | Status: DC
  Administered 2020-07-19 – 2020-07-20 (×3): 2 [IU] via SUBCUTANEOUS
  Administered 2020-07-20: 5 [IU] via SUBCUTANEOUS
  Administered 2020-07-20: 3 [IU] via SUBCUTANEOUS
  Administered 2020-07-21: 2 [IU] via SUBCUTANEOUS
  Administered 2020-07-21: 3 [IU] via SUBCUTANEOUS
  Administered 2020-07-21 – 2020-07-22 (×3): 2 [IU] via SUBCUTANEOUS
  Administered 2020-07-23 – 2020-07-24 (×6): 3 [IU] via SUBCUTANEOUS
  Filled 2020-07-19: qty 1

## 2020-07-19 MED ORDER — APIXABAN 2.5 MG PO TABS
2.5000 mg | ORAL_TABLET | Freq: Two times a day (BID) | ORAL | Status: DC
  Administered 2020-07-19 – 2020-07-21 (×5): 2.5 mg via ORAL
  Filled 2020-07-19 (×7): qty 1

## 2020-07-19 MED ORDER — SIMVASTATIN 20 MG PO TABS
20.0000 mg | ORAL_TABLET | Freq: Every day | ORAL | Status: DC
  Administered 2020-07-19 – 2020-07-21 (×3): 20 mg via ORAL
  Filled 2020-07-19 (×3): qty 1
  Filled 2020-07-19: qty 2
  Filled 2020-07-19: qty 1

## 2020-07-19 NOTE — ED Notes (Signed)
Pt awake and confused. Yelling for help. But when asked what is wrong she states she doesn't know

## 2020-07-19 NOTE — ED Notes (Signed)
Messaged hospitalist for medication for anxiety for pt.  Restraints ordered instead. restraints d/c'd due to not being appropriate. Pt is not puling at lines.

## 2020-07-19 NOTE — Progress Notes (Signed)
I called this patient's daughter to let her know that patient was positive for a UTI, she informed me the patient is currently in the hospital. She believe the patient will be admitted. Will not send an antibiotic to patient's pharmacy as she should be treated while in the hospital. Will continue to monitor patient's chart and encouraged daughter to call us if we can be of further assistance for the time being.

## 2020-07-19 NOTE — Progress Notes (Signed)
*  PRELIMINARY RESULTS* Echocardiogram 2D Echocardiogram has been performed.  Leavy Cella 07/19/2020, 11:36 AM

## 2020-07-19 NOTE — Progress Notes (Signed)
Patient Demographics:    Amy Hoffman, is a 79 y.o. female, DOB - 12-14-1940, CHY:[id number]  Admit date - 07/19/2020   Admitting Physician Bernadette Hoit, DO  Outpatient Primary MD for the patient is [name], NP  LOS - 1   Chief Complaint  Patient presents with  . Shortness of Breath    Patient complaining of SHOB and edema to BLE for the past two days. Recently discharged with new DX of CHF. Did not obtain RX for diuretic at discharge. Did not follow-up for this on Monday as planned. Reports weeping to BLE.         Subjective:    Amy Hoffman today has no fevers, no emesis,  No chest pain,   --Fatigue malaise persist, dyspnea and hypoxia persist -Extensive conversations with patient's POA/daughter  Assessment  & Plan :    Principal Problem:   Acute respiratory failure with hypoxia ([place]) Active Problems:   Essential hypertension   CHF (congestive heart failure) (HCC)   Hyperlipidemia associated with type 2 diabetes mellitus (HCC)   Bilateral pleural effusion   Hypoalbuminemia   Prolonged QT interval   Pericardial effusion   Hydronephrosis   Hyperglycemia due to diabetes mellitus (HCC)   Elevated brain natriuretic peptide (BNP) level   Atelectasis of left lung  Brief Summary:-  79 y.o. female with medical history significant for atrial fibrillation, dementia, chronic diastolic CHF, DM2, abnormal uterine bleeding, allergic rhinitis, anxiety, depression, dementia, right breast cancer, HTN, HLD, obesity, GERD and osteoarthritis admitted on 07/17/2020 with fatigue and worsening shortness of breath with oxygen requirement and found to have recurrent left-sided pleural effusion as well as possible UTI  A/p 1) acute on chronic hypoxic respiratory failure--as per patient's daughter patient likely used oxygen on and off previously -Currently requiring 3 L of oxygen via nasal cannula in the  setting of recurrent left-sided pleural effusion and diastolic dysfunction CHF -Procalcitonin is less than 0.10 patient is afebrile, and without leukocytosis, doubt pneumonia-- --Patient's healthcare power of attorney/daughter reluctant to try Lasix at this time -We will consider repeat therapeutic thoracentesis if patient becomes more symptomatic  2)HFpEF-patient with chronic diastolic dysfunction CHF with recurrent left-sided pleural effusion, last thoracentesis was 07/11/2020 with 630 mils of fluid removed-please see #1 above - -Initially there was concern for possible pericardial effusion due to CT chest finding, however echocardiogram on 07/19/2020 without significant pericardial effusion -Echo does show EF of 50 to 55% with regional wall motion normalities, along with severely dilated left atrium and small pericardial effusion -Per cardiology service no further intervention or work-up needed with regards to the small pericardial effusion  3) recurrent UTI--in the setting of hydronephrosis-CT abdomen and pelvis showed mild to moderate right hydronephrosis with a nondilated ureter which may reflect ureteropelvic junction obstruction -IV Rocephin pending urine culture results (patient has Klebsiella UTI few weeks ago) -Give Flomax and consider outpatient follow-up with urology  4) chronic atrial fibrillation--Echo as above #2, with severely dilated left atrium -CHA2DS2-VASc: 6 -Continue Eliquis  -QTc is prolonged so amiodarone is on hold  5)DM2-A1c 7.7 reflecting uncontrolled DM with hyperglycemia -Use Novolog/Humalog Sliding scale insulin with Accu-Cheks/Fingersticks as ordered   6)HTN--hold amlodipine due to soft BP  7)Dementia with behavioral disturbance -Use Ativan as needed  for agitation.  8)Social/Ethics--extensive conversation with patient's daughter and POA about current condition, plan of care and goals of care -Patient is a DNR/DNI without any limitations to treatment at  this time -She remains full scope of treatment -Apparently advanced home health previously recommended to patient's daughter and family that they should consider long-term placement and possibly palliative care/hospice involvement -Patient's daughter and family are not ready to transition to palliative or hospice care at this time -Ideally they would like patient to return home with home health services upon discharge   Disposition/Need for in-Hospital Stay- patient unable to be discharged at this time due to acute hypoxic respiratory failure secondary to CHF exacerbation and fatigue and malaise secondary to UTI IV antibiotics-  Status is: Inpatient  Remains inpatient appropriate because:acute hypoxic respiratory failure secondary to CHF exacerbation and fatigue and malaise secondary to UTI IV antibiotics-  Disposition: The patient is from: Home              Anticipated d/c is to: Home with [facility]              Anticipated d/c date is: 2 days              Patient currently is not medically stable to d/c. Barriers: Not Clinically Stable- acute hypoxic respiratory failure secondary to CHF exacerbation and fatigue and malaise secondary to UTI IV antibiotics-  Code Status : DNR  Family Communication:  Discussed with  daughter [name] by phone  Consults  :  na  DVT Prophylaxis  : Apixaban  Lab Results  Component Value Date   PLT 330 07/19/2020    Inpatient Medications  Scheduled Meds: . apixaban  2.5 mg Oral BID  . insulin aspart  0-15 Units Subcutaneous Q4H  . simvastatin  20 mg Oral Daily   Continuous Infusions: . cefTRIAXone (ROCEPHIN)  IV     PRN Meds:.    Anti-infectives (From admission, onward)   Start     Dose/Rate Route Frequency Ordered Stop   07/19/20 1600  cefTRIAXone (ROCEPHIN) 1 g in sodium chloride 0.9 % 100 mL IVPB        1 g 200 mL/hr over 30 Minutes Intravenous Every 24 hours 07/19/20 1536          Objective:   Vitals:   07/19/20  1300 07/19/20 1330 07/19/20 1453 07/19/20 1532  BP: (!) 143/72 140/69 126/66 (!) 139/98  Pulse: 76 69 69 76  Resp: 18 13 15  (!) 22  Temp:    97.8 F (36.6 C)  TempSrc:    Oral  SpO2: 96% 100% 100% 100%  Weight:      Height:        Wt Readings from Last 3 Encounters:  08/06/2020 68 kg  07/10/20 68 kg  07/09/20 60.3 kg    No intake or output data in the 24 hours ending 07/19/20 1542   Physical Exam  Gen:- Awake Alert,  In no apparent distress  HEENT:- [place].AT, No sclera icterus Nose- [place] 3L/min Neck-Supple Neck,No JVD,.  Lungs-diminished breath sounds on the left, faint bibasilar rales,  CV- S1, S2 normal, irregular  Abd-  +ve B.Sounds, Abd Soft, No tenderness,    Extremity/Skin:-  pedal pulses present  Psych-affect is flat, episodes of disorientation and confusion  Neuro-generalized weakness, no new focal deficits, no tremors   Data Review:   Micro Results Recent Results (from the past 240 hour(s))  Respiratory Panel by RT PCR (Flu A&B, Covid) -  Nasopharyngeal Swab     Status: None   Collection Time: 07/10/20  3:15 PM   Specimen: Nasopharyngeal Swab  Result Value Ref Range Status   SARS Coronavirus 2 by RT PCR NEGATIVE NEGATIVE Final    Comment: (NOTE) SARS-CoV-2 target nucleic acids are NOT DETECTED.  The SARS-CoV-2 RNA is generally detectable in upper respiratoy specimens during the acute phase of infection. The lowest concentration of SARS-CoV-2 viral copies this assay can detect is 131 copies/mL. A negative result does not preclude SARS-Cov-2 infection and should not be used as the sole basis for treatment or other patient management decisions. A negative result may occur with  improper specimen collection/handling, submission of specimen other than nasopharyngeal swab, presence of viral mutation(s) within the areas targeted by this assay, and inadequate number of viral copies (<131 copies/mL). A negative result must be combined with clinical observations,  patient history, and epidemiological information. The expected result is Negative.  Fact Sheet for Patients:  PinkCheek.be  Fact Sheet for Healthcare Providers:  GravelBags.it  This test is no t yet approved or cleared by the [place] FDA and  has been authorized for detection and/or diagnosis of SARS-CoV-2 by FDA under an Emergency Use Authorization (EUA). This EUA will remain  in effect (meaning this test can be used) for the duration of the COVID-19 declaration under Section 564(b)(1) of the Act, 21 U.S.C. section 360bbb-3(b)(1), unless the authorization is terminated or revoked sooner.     Influenza A by PCR NEGATIVE NEGATIVE Final   Influenza B by PCR NEGATIVE NEGATIVE Final    Comment: (NOTE) The Xpert Xpress SARS-CoV-2/FLU/RSV assay is intended as an aid in  the diagnosis of influenza from Nasopharyngeal swab specimens and  should not be used as a sole basis for treatment. Nasal washings and  aspirates are unacceptable for Xpert Xpress SARS-CoV-2/FLU/RSV  testing.  Fact Sheet for Patients: PinkCheek.be  Fact Sheet for Healthcare Providers: GravelBags.it  This test is not yet approved or cleared by the [place] FDA and  has been authorized for detection and/or diagnosis of SARS-CoV-2 by  FDA under an Emergency Use Authorization (EUA). This EUA will remain  in effect (meaning this test can be used) for the duration of the  Covid-19 declaration under Section 564(b)(1) of the Act, 21  U.S.C. section 360bbb-3(b)(1), unless the authorization is  terminated or revoked. Performed at [facility], [address] [place], [place] [address]   Urine Culture     Status: None   Collection Time: 07/10/20  7:15 PM   Specimen: Urine, Clean Catch  Result Value Ref Range Status   Specimen Description   Final    URINE, CLEAN CATCH Performed at [facility], [address], [place], [place] [address]    Special Requests   Final    NONE Performed at [facility], [address] [place], [place] [address]    Culture   Final    NO GROWTH Performed at [facility], [place] [address], [place], [place] [address]    Report Status 07/12/2020 FINAL  Final  Gram stain     Status: None   Collection Time: 07/11/20 11:06 AM   Specimen: Pleura; Body Fluid  Result Value Ref Range Status   Specimen Description PLEURAL  Final   Special Requests NONE  Final   Gram Stain   Final    NO ORGANISMS SEEN WBC PRESENT, PREDOMINANTLY MONONUCLEAR Performed at [facility], [address] [place], [place] [address]  Report Status 07/11/2020 FINAL  Final  Resp Panel by RT PCR (RSV, Flu A&B, Covid) - Nasopharyngeal Swab     Status: None   Collection Time: 07/31/2020  3:25 PM   Specimen: Nasopharyngeal Swab  Result Value Ref Range Status   SARS Coronavirus 2 by RT PCR NEGATIVE NEGATIVE Final    Comment: (NOTE) SARS-CoV-2 target nucleic acids are NOT DETECTED.  The SARS-CoV-2 RNA is generally detectable in upper respiratoy specimens during the acute phase of infection. The lowest concentration of SARS-CoV-2 viral copies this assay can detect is 131 copies/mL. A negative result does not preclude SARS-Cov-2 infection and should not be used as the sole basis for treatment or other patient management decisions. A negative result may occur with  improper specimen collection/handling, submission of specimen other than nasopharyngeal swab, presence of viral mutation(s) within the areas targeted by this assay, and inadequate number of viral copies (<131 copies/mL). A negative result must be combined with clinical observations, patient history, and epidemiological information. The expected result is Negative.  Fact Sheet for Patients:  PinkCheek.be  Fact Sheet for Healthcare Providers:   GravelBags.it  This test is no t yet approved or cleared by the [place] FDA and  has been authorized for detection and/or diagnosis of SARS-CoV-2 by FDA under an Emergency Use Authorization (EUA). This EUA will remain  in effect (meaning this test can be used) for the duration of the COVID-19 declaration under Section 564(b)(1) of the Act, 21 U.S.C. section 360bbb-3(b)(1), unless the authorization is terminated or revoked sooner.     Influenza A by PCR NEGATIVE NEGATIVE Final   Influenza B by PCR NEGATIVE NEGATIVE Final    Comment: (NOTE) The Xpert Xpress SARS-CoV-2/FLU/RSV assay is intended as an aid in  the diagnosis of influenza from Nasopharyngeal swab specimens and  should not be used as a sole basis for treatment. Nasal washings and  aspirates are unacceptable for Xpert Xpress SARS-CoV-2/FLU/RSV  testing.  Fact Sheet for Patients: PinkCheek.be  Fact Sheet for Healthcare Providers: GravelBags.it  This test is not yet approved or cleared by the [place] FDA and  has been authorized for detection and/or diagnosis of SARS-CoV-2 by  FDA under an Emergency Use Authorization (EUA). This EUA will remain  in effect (meaning this test can be used) for the duration of the  Covid-19 declaration under Section 564(b)(1) of the Act, 21  U.S.C. section 360bbb-3(b)(1), unless the authorization is  terminated or revoked.    Respiratory Syncytial Virus by PCR NEGATIVE NEGATIVE Final    Comment: (NOTE) Fact Sheet for Patients: PinkCheek.be  Fact Sheet for Healthcare Providers: GravelBags.it  This test is not yet approved or cleared by the [place] FDA and  has been authorized for detection and/or diagnosis of SARS-CoV-2 by  FDA under an Emergency Use Authorization (EUA). This EUA will remain  in effect (meaning this test can be  used) for the duration of the  COVID-19 declaration under Section 564(b)(1) of the Act, 21 U.S.C.  section 360bbb-3(b)(1), unless the authorization is terminated or  revoked. Performed at [facility], [address], [place], [place] [address]     Radiology Reports DG Chest 1 View  Result Date: 07/11/2020 CLINICAL DATA:  LEFT pleural effusion post thoracentesis EXAM: CHEST  1 VIEW COMPARISON:  07/10/2020 FINDINGS: Enlargement of cardiac silhouette with pulmonary vascular congestion. Atherosclerotic calcification aorta. Small bibasilar pleural effusions and atelectasis. Improved aeration and decreased pleural effusions/atelectasis at LEFT base since prior exam. No pneumothorax. Bones demineralized.  IMPRESSION: No pneumothorax following LEFT thoracentesis. Electronically Signed   By: Lavonia Dana M.D.   On: 07/11/2020 11:36   DG Chest 2 View  Result Date: 07/10/2020 CLINICAL DATA:  79 year old female with altered mental status. EXAM: CHEST - 2 VIEW COMPARISON:  Chest radiograph dated 06/30/2020. FINDINGS: There are bilateral pleural effusions, left greater right, which have increased in size compared to the prior radiograph. Bibasilar atelectasis or infiltrate, also increased since the prior radiograph. No pneumothorax. Stable cardiomegaly. Atherosclerotic calcification of the aorta. No acute osseous pathology. Surgical clips over the right chest as well as sclerotic lesion of the proximal left humeral diaphysis similar to prior radiograph. IMPRESSION: Bilateral pleural effusions, left greater right, and bibasilar atelectasis or infiltrate, increased since the prior radiograph. Electronically Signed   By: Anner Crete M.D.   On: 07/10/2020 15:51   DG Chest 2 View  Result Date: 06/30/2020 CLINICAL DATA:  Foul-smelling urine and abnormal EKG EXAM: CHEST - 2 VIEW COMPARISON:  02/13/2020 FINDINGS: Cardiac shadow is enlarged. Aortic calcifications are noted. Lungs are well aerated bilaterally.  Chronic blunting of the right costophrenic angle is noted. No new focal infiltrate is seen. Mild central vascular congestion is noted. IMPRESSION: Changes of mild CHF without edema. Chronic changes in the right base. Electronically Signed   By: Inez Catalina M.D.   On: 06/30/2020 15:32   CT HEAD WO CONTRAST  Result Date: 07/01/2020 CLINICAL DATA:  Altered mental status on top of baseline dementia. Chronic anticoagulation. EXAM: CT HEAD WITHOUT CONTRAST TECHNIQUE: Contiguous axial images were obtained from the base of the skull through the vertex without intravenous contrast. COMPARISON:  None. FINDINGS: Brain: Examination is technically limited by motion artifact. There is evidence of diffuse cerebral atrophy. Ventricular dilatation is likely due to central atrophy. Low-attenuation changes in the deep white matter likely represent small vessel ischemic change. No obvious mass effect or midline shift. No acute intracranial hemorrhage is demonstrated. No abnormal extra-axial fluid collections. Vascular: No acute vascular abnormality is identified. Skull: Visualized calvarium appears intact. Sinuses/Orbits: Visualized paranasal sinuses and mastoid air cells are clear. Other: None. IMPRESSION: 1. Technically limited study due to motion artifact. 2. No acute intracranial abnormalities are demonstrated. 3. Chronic atrophy and small vessel ischemic changes. Electronically Signed   By: Lucienne Capers M.D.   On: 07/01/2020 00:43   CT Angio Chest PE W and/or Wo Contrast  Result Date: 07/20/2020 CLINICAL DATA:  Dementia patient with shortness of breath and abdominal pain. EXAM: CT ANGIOGRAPHY CHEST CT ABDOMEN AND PELVIS WITH CONTRAST TECHNIQUE: Multidetector CT imaging of the chest was performed using the standard protocol during bolus administration of intravenous contrast. Multiplanar CT image reconstructions and MIPs were obtained to evaluate the vascular anatomy. Multidetector CT imaging of the abdomen and pelvis  was performed using the standard protocol during bolus administration of intravenous contrast. CONTRAST:  53mL OMNIPAQUE IOHEXOL 350 MG/ML SOLN COMPARISON:  Same day chest radiograph, CT chest dated 02/06/2020, CT abdomen pelvis dated 02/04/2019. FINDINGS: CTA CHEST FINDINGS Cardiovascular: Satisfactory opacification of the pulmonary arteries to the segmental level. No evidence of pulmonary embolism. Vascular calcifications are seen in the coronary arteries and aortic arch. There heart is enlarged and there is a moderate to large pericardial effusion. Mediastinum/Nodes: No enlarged mediastinal, hilar, or axillary lymph nodes. Aspirated debris is seen in the trachea (series 8 images 30 3-38). Thyroid gland and esophagus demonstrate no significant findings. Lungs/Pleura: There is a large left and a small right pleural effusion with associated atelectasis. There  are mild scattered bilateral ground-glass opacities. There is no pneumothorax. Musculoskeletal: Degenerative changes are seen in the spine. Review of the MIP images confirms the above findings. CT ABDOMEN and PELVIS FINDINGS Hepatobiliary: No focal liver abnormality is seen. The patient is status post cholecystectomy. There is moderate intra and extrahepatic biliary ductal dilatation with the common bile duct measuring up to 17 mm in size. Pancreas: Unremarkable. No pancreatic ductal dilatation or surrounding inflammatory changes. Spleen: Normal in size without focal abnormality. Adrenals/Urinary Tract: Adrenal glands are unremarkable. There is mild-to-moderate right hydronephrosis with a nondilated ureter which may reflect ureteropelvic junction obstruction. There is no hydronephrosis on the left. No renal calculi or focal renal lesion is identified on either side. Bladder is unremarkable. Stomach/Bowel: Stomach is within normal limits. No pericecal inflammatory changes are noted to suggest acute appendicitis. There is colonic diverticulosis without evidence  of diverticulitis. No evidence of bowel wall thickening, distention, or inflammatory changes. Vascular/Lymphatic: Aortic atherosclerosis. No enlarged abdominal or pelvic lymph nodes. Reproductive: Status post hysterectomy. No adnexal masses. Other: No abdominal wall hernia or abnormality. No abdominopelvic ascites. Anasarca is noted. Musculoskeletal: Degenerative changes are seen in the spine. There is 6 mm anterolisthesis of L4 on L5, not significantly changed since 12/05/2018. Review of the MIP images confirms the above findings. IMPRESSION: 1. No evidence of pulmonary embolism. 2. Large left and small right pleural effusions with associated atelectasis. 3. Mild scattered bilateral ground-glass opacities likely represent atelectasis and/or pneumonia. 4. Cardiomegaly with a moderate to large pericardial effusion. 5. Moderate intra and extrahepatic biliary ductal dilatation with the common bile duct measuring up to 17 mm in size. This may reflect post cholecystectomy state and patient age. Correlation with liver function tests is recommended. 6. Mild-to-moderate right hydronephrosis with a nondilated ureter which may reflect ureteropelvic junction obstruction. Aortic Atherosclerosis (ICD10-I70.0). Electronically Signed   By: Zerita Boers M.D.   On: 07/30/2020 18:08   CT ABDOMEN PELVIS W CONTRAST  Result Date: 07/28/2020 CLINICAL DATA:  Dementia patient with shortness of breath and abdominal pain. EXAM: CT ANGIOGRAPHY CHEST CT ABDOMEN AND PELVIS WITH CONTRAST TECHNIQUE: Multidetector CT imaging of the chest was performed using the standard protocol during bolus administration of intravenous contrast. Multiplanar CT image reconstructions and MIPs were obtained to evaluate the vascular anatomy. Multidetector CT imaging of the abdomen and pelvis was performed using the standard protocol during bolus administration of intravenous contrast. CONTRAST:  24mL OMNIPAQUE IOHEXOL 350 MG/ML SOLN COMPARISON:  Same day chest  radiograph, CT chest dated 02/06/2020, CT abdomen pelvis dated 02/04/2019. FINDINGS: CTA CHEST FINDINGS Cardiovascular: Satisfactory opacification of the pulmonary arteries to the segmental level. No evidence of pulmonary embolism. Vascular calcifications are seen in the coronary arteries and aortic arch. There heart is enlarged and there is a moderate to large pericardial effusion. Mediastinum/Nodes: No enlarged mediastinal, hilar, or axillary lymph nodes. Aspirated debris is seen in the trachea (series 8 images 30 3-38). Thyroid gland and esophagus demonstrate no significant findings. Lungs/Pleura: There is a large left and a small right pleural effusion with associated atelectasis. There are mild scattered bilateral ground-glass opacities. There is no pneumothorax. Musculoskeletal: Degenerative changes are seen in the spine. Review of the MIP images confirms the above findings. CT ABDOMEN and PELVIS FINDINGS Hepatobiliary: No focal liver abnormality is seen. The patient is status post cholecystectomy. There is moderate intra and extrahepatic biliary ductal dilatation with the common bile duct measuring up to 17 mm in size. Pancreas: Unremarkable. No pancreatic ductal dilatation or surrounding inflammatory  changes. Spleen: Normal in size without focal abnormality. Adrenals/Urinary Tract: Adrenal glands are unremarkable. There is mild-to-moderate right hydronephrosis with a nondilated ureter which may reflect ureteropelvic junction obstruction. There is no hydronephrosis on the left. No renal calculi or focal renal lesion is identified on either side. Bladder is unremarkable. Stomach/Bowel: Stomach is within normal limits. No pericecal inflammatory changes are noted to suggest acute appendicitis. There is colonic diverticulosis without evidence of diverticulitis. No evidence of bowel wall thickening, distention, or inflammatory changes. Vascular/Lymphatic: Aortic atherosclerosis. No enlarged abdominal or pelvic  lymph nodes. Reproductive: Status post hysterectomy. No adnexal masses. Other: No abdominal wall hernia or abnormality. No abdominopelvic ascites. Anasarca is noted. Musculoskeletal: Degenerative changes are seen in the spine. There is 6 mm anterolisthesis of L4 on L5, not significantly changed since 12/05/2018. Review of the MIP images confirms the above findings. IMPRESSION: 1. No evidence of pulmonary embolism. 2. Large left and small right pleural effusions with associated atelectasis. 3. Mild scattered bilateral ground-glass opacities likely represent atelectasis and/or pneumonia. 4. Cardiomegaly with a moderate to large pericardial effusion. 5. Moderate intra and extrahepatic biliary ductal dilatation with the common bile duct measuring up to 17 mm in size. This may reflect post cholecystectomy state and patient age. Correlation with liver function tests is recommended. 6. Mild-to-moderate right hydronephrosis with a nondilated ureter which may reflect ureteropelvic junction obstruction. Aortic Atherosclerosis (ICD10-I70.0). Electronically Signed   By: Zerita Boers M.D.   On: 07/26/2020 18:08   DG Chest Port 1 View  Result Date: 07/28/2020 CLINICAL DATA:  Shortness of breath. EXAM: PORTABLE CHEST 1 VIEW COMPARISON:  Radiograph 07/11/2020.  CT 02/06/2020 FINDINGS: Stable cardiomegaly. Unchanged mediastinal contours. Bilateral pleural effusions, not significantly changed on the right and increased on the left. Increased left basilar airspace disease typically atelectasis. Vascular congestion is similar. There is no pneumothorax. Stable osseous structures. Surgical clips in the right axilla. IMPRESSION: 1. Increased left pleural effusion with progressive left basilar airspace disease over the past week, typically atelectasis. 2. No significant change in right pleural effusion. 3. Stable cardiomegaly and vascular congestion. Electronically Signed   By: Keith Rake M.D.   On: 08/12/2020 16:03    ECHOCARDIOGRAM COMPLETE  Result Date: 07/19/2020    ECHOCARDIOGRAM REPORT   Patient Name:   Amy Hoffman Throne Date of Exam: 07/19/2020 Medical Rec #:  [id number]       Height:       65.0 in Accession #:    [id number]      Weight:       149.9 lb Date of Birth:  1941/08/20       BSA:          1.750 m Patient Age:    86 years        BP:           125/75 mmHg Patient Gender: F               HR:           107 bpm. Exam Location:  [facility] Procedure: 2D Echo Indications:    Pericardial effusion 423.9 / I31.3  History:        Patient has prior history of Echocardiogram examinations, most                 recent 12/17/2019. CHF, Pericardial Disease; Risk                 Factors:Hypertension, Dyslipidemia, Non-Smoker and Diabetes.  Dementia, Bradycardia.  Sonographer:    Leavy Cella RDCS (AE) Referring Phys: [id number] OLADAPO ADEFESO IMPRESSIONS  1. Left ventricular ejection fraction, by estimation, is 50 to 55%. The left ventricle has low normal function. The left ventricle demonstrates regional wall motion abnormalities (see scoring diagram/findings for description). Left ventricular diastolic  parameters are indeterminate.  2. Right ventricular systolic function is low normal. The right ventricular size is normal.  3. Left atrial size was severely dilated.  4. A small pericardial effusion is present. The pericardial effusion is circumferential. There is no evidence of cardiac tamponade.  5. The mitral valve is normal in structure. Trivial mitral valve regurgitation. No evidence of mitral stenosis.  6. The aortic valve is tricuspid. Aortic valve regurgitation is not visualized. No aortic stenosis is present. FINDINGS  Left Ventricle: Left ventricular ejection fraction, by estimation, is 50 to 55%. The left ventricle has low normal function. The left ventricle demonstrates regional wall motion abnormalities. The left ventricular internal cavity size was normal in size. There is no left ventricular  hypertrophy. Left ventricular diastolic parameters are indeterminate. Right Ventricle: The right ventricular size is normal. No increase in right ventricular wall thickness. Right ventricular systolic function is low normal. Left Atrium: Left atrial size was severely dilated. Right Atrium: Right atrial size was normal in size. Pericardium: A small pericardial effusion is present. The pericardial effusion is circumferential. There is no evidence of cardiac tamponade. Mitral Valve: The mitral valve is normal in structure. Trivial mitral valve regurgitation. No evidence of mitral valve stenosis. Tricuspid Valve: The tricuspid valve is normal in structure. Tricuspid valve regurgitation is mild . No evidence of tricuspid stenosis. Aortic Valve: The aortic valve is tricuspid. Aortic valve regurgitation is not visualized. No aortic stenosis is present. Aortic valve mean gradient measures 4.1 mmHg. Aortic valve peak gradient measures 6.6 mmHg. Aortic valve area, by VTI measures 1.43 cm. Pulmonic Valve: The pulmonic valve was not well visualized. Pulmonic valve regurgitation is not visualized. No evidence of pulmonic stenosis. Aorta: The aortic root is normal in size and structure. Pulmonary Artery: 34.  Venous: The inferior vena cava was not well visualized. IAS/Shunts: The interatrial septum was not well visualized.  LEFT VENTRICLE PLAX 2D LVIDd:         3.91 cm  Diastology LVIDs:         3.08 cm  LV e' medial:    5.22 cm/s LV PW:         0.90 cm  LV E/e' medial:  17.4 LV IVS:        0.88 cm  LV e' lateral:   5.15 cm/s LVOT diam:     1.90 cm  LV E/e' lateral: 17.6 LV SV:         45 LV SV Index:   26 LVOT Area:     2.84 cm  RIGHT VENTRICLE RV S prime:     6.18 cm/s TAPSE (M-mode): 1.6 cm LEFT ATRIUM             Index       RIGHT ATRIUM           Index LA diam:        4.40 cm 2.51 cm/m  RA Area:     15.80 cm LA Vol (A2C):   61.0 ml 34.86 ml/m RA Volume:   39.80 ml  22.74 ml/m LA Vol (A4C):   72.0 ml 41.14 ml/m LA  Biplane Vol: 68.8 ml 39.31 ml/m  AORTIC VALVE AV Area (  Vmax):    1.44 cm AV Area (Vmean):   1.40 cm AV Area (VTI):     1.43 cm AV Vmax:           128.76 cm/s AV Vmean:          95.477 cm/s AV VTI:            0.313 m AV Peak Grad:      6.6 mmHg AV Mean Grad:      4.1 mmHg LVOT Vmax:         65.55 cm/s LVOT Vmean:        47.156 cm/s LVOT VTI:          0.158 m LVOT/AV VTI ratio: 0.50  AORTA Ao Root diam: 2.60 cm MITRAL VALVE               TRICUSPID VALVE MV Area (PHT): 2.90 cm    TR Peak grad:   33.6 mmHg MV Decel Time: 262 msec    TR Vmax:        290.00 cm/s MV E velocity: 90.80 cm/s MV A velocity: 66.00 cm/s  SHUNTS MV E/A ratio:  1.38        Systemic VTI:  0.16 m                            Systemic Diam: 1.90 cm Carlyle Dolly MD Electronically signed by Carlyle Dolly MD Signature Date/Time: 07/19/2020/12:39:52 PM    Final    US THORACENTESIS ASP PLEURAL SPACE W/IMG GUIDE  Result Date: 07/11/2020 INDICATION: LEFT pleural effusion EXAM: ULTRASOUND GUIDED DIAGNOSTIC AND THERAPEUTIC THORACENTESIS MEDICATIONS: None COMPLICATIONS: None immediate PROCEDURE: An ultrasound guided thoracentesis was thoroughly discussed with the patient and questions answered. The benefits, risks, alternatives and complications were also discussed. The patient understands and wishes to proceed with the procedure. Written consent was obtained. Ultrasound was performed to localize and mark an adequate pocket of fluid in the LEFT chest. The area was then prepped and draped in the normal sterile fashion. 1% Lidocaine was used for local anesthesia. Under ultrasound guidance a 8 French thoracentesis catheter was introduced. Thoracentesis was performed. The catheter was removed and a dressing applied. FINDINGS: A total of approximately 630 mL of yellow LEFT pleural fluid was removed. Samples were sent to the laboratory as requested by the clinical team. IMPRESSION: Successful ultrasound guided LEFT thoracentesis yielding 630 mL of  pleural fluid. Electronically Signed   By: Lavonia Dana M.D.   On: 07/11/2020 11:35     CBC Recent Labs  Lab 07/30/2020 1500 07/19/20 0351  WBC 9.9 9.5  HGB 10.4* 10.0*  HCT 35.1* 35.7*  PLT 315 330  MCV 87.5 90.6  MCH 25.9* 25.4*  MCHC 29.6* 28.0*  RDW 16.2* 16.3*  LYMPHSABS 0.8  --   MONOABS 0.8  --   EOSABS 0.1  --   BASOSABS 0.0  --     Chemistries  Recent Labs  Lab 07/13/20 0609 07/28/2020 1500 07/19/20 0351  NA 139 142 145  K 5.1 4.6 4.8  CL 106 107 108  CO2 24 30 29   GLUCOSE 88 183* 167*  BUN 50* 22 18  CREATININE 1.37* 0.80 0.70  CALCIUM 8.5* 8.8* 8.9  MG  --   --  2.0  AST  --  22 20  ALT  --  35 33  ALKPHOS  --  106 95  BILITOT  --  0.9 0.7   ------------------------------------------------------------------------------------------------------------------  No results for input(s): CHOL, HDL, LDLCALC, TRIG, CHOLHDL, LDLDIRECT in the last 72 hours.  Lab Results  Component Value Date   HGBA1C 7.7 (H) 06/30/2020   ------------------------------------------------------------------------------------------------------------------ No results for input(s): TSH, T4TOTAL, T3FREE, THYROIDAB in the last 72 hours.  Invalid input(s): FREET3 ------------------------------------------------------------------------------------------------------------------ No results for input(s): VITAMINB12, FOLATE, FERRITIN, TIBC, IRON, RETICCTPCT in the last 72 hours.  Coagulation profile Recent Labs  Lab 07/19/20 0351  INR 1.3*    No results for input(s): DDIMER in the last 72 hours.  Cardiac Enzymes No results for input(s): CKMB, TROPONINI, MYOGLOBIN in the last 168 hours.  Invalid input(s): CK ------------------------------------------------------------------------------------------------------------------    Component Value Date/Time   BNP 375.0 (H) 07/26/2020 1500     Roxan Hockey M.D on 07/19/2020 at 3:42 PM  Go to [email or web address] - for contact info  [facility] - Office  [phone]

## 2020-07-19 NOTE — ED Notes (Signed)
Pt clean dry; purewick in tact

## 2020-07-19 NOTE — TOC Initial Note (Addendum)
Transition of Care [facility]) - Initial/Assessment Note    Patient Details  Name: Amy Hoffman MRN: [id number] Date of Birth: 09-09-1941  Transition of [facility]) CM/SW Contact:    Shade Flood, LCSW Phone Number: 07/19/2020, 12:34 PM  Clinical Narrative:                  Patient is a 79 year old female who was admitted from home. Patient was recently discharged from [facility]. She went home with [facility] RN and PT at previous dc. Patient has a history of breast cancer, hyperlipidemia, atrial fibrillation, dementia with behavioral disturbance, Type 2 diabetes mellitus, AKI, pleural effusion, chronic respiratory failure with hypoxia, and acute on chronic diastolic CHF. Readmission checklist completed due to high readmission score.  During pt's previous admission, pt's husband informed TOC that patient lives at home with him and has a Systems analyst 7 days a week for 12 hours/day. Her current DME includes O2, walker, wheelchair, and 3N1. Husband reported the patient does not like to wear the O2, but the private duty nurse makes sure the patient wears it as needed. Husband reported the patient requires extensive assistance with ADLs. Patient takes her medications as prescribed as the family and nurse crush the patient's medications in applesauce for her.  Confirmed with Vaughan Basta with [facility] that patient is active for PT and RN. TOC will follow.  1550: Linda from [facility] updated this LCSW that her director stated that they will not take pt back for [facility] at dc because they feel she needs long term nursing home or hospice. Updated MD.   Expected Discharge Plan: [place] Barriers to Discharge: Continued Medical Work up   Patient Goals and CMS Choice        Expected Discharge Plan and Services Expected Discharge Plan: [place] In-house Referral: Clinical Social Work     Living arrangements for the past 2 months: [place]                                       Prior Living Arrangements/Services Living arrangements for the past 2 months: Single Family Home Lives with:: Spouse Patient language and need for interpreter reviewed:: Yes Do you feel safe going back to the place where you live?: Yes      Need for Family Participation in Patient Care: Yes (Comment) Care giver support system in place?: Yes (comment) Current home services: DME, Home PT, Home RN Criminal Activity/Legal Involvement Pertinent to Current Situation/Hospitalization: No - Comment as needed  Activities of Daily Living      Permission Sought/Granted                  Emotional Assessment         Alcohol / Substance Use: Not Applicable Psych Involvement: No (comment)  Admission diagnosis:  Bilateral pleural effusion [J90] Patient Active Problem List   Diagnosis Date Noted  . Hypoalbuminemia 07/19/2020  . Prolonged QT interval 07/19/2020  . Pericardial effusion 07/19/2020  . Hydronephrosis 07/19/2020  . Hyperglycemia due to diabetes mellitus ([place]) 07/19/2020  . Elevated brain natriuretic peptide (BNP) level 07/19/2020  . Atelectasis of left lung 07/19/2020  . Bilateral pleural effusion 07/23/2020  . Mild protein malnutrition ([place]) 07/11/2020  . Failure to thrive in adult 07/10/2020  . Acute on chronic diastolic CHF ([place]) 93/73/4287  . Microcytic anemia 07/10/2020  .  Anticoagulated 07/08/2020  . Tachycardia-bradycardia syndrome ([place])   . Acute lower UTI 07/01/2020  . Bradycardia 06/30/2020  . 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 Chronic constipation 04/23/2020  . Pleural effusion, bilateral 03/09/2020  . Chronic respiratory failure with hypoxia ([place]) 03/09/2020  . AKI (acute kidney injury) ([place]) 03/01/2020  . Type 2 diabetes mellitus ([place]) 02/26/2020  . Hypertensive heart disease with chronic diastolic congestive heart failure ([place]) 02/16/2020  . Hyperlipidemia associated with type 2 diabetes mellitus ([place]) 02/16/2020  .  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 Do not resuscitate status   . CHF (congestive heart failure) ([place]) 02/05/2020  . Hypokalemia   . Medicare annual wellness visit, subsequent   . Acute respiratory failure with hypoxia ([place])   . Dementia with behavioral disturbance ([place])   . Paroxysmal atrial fibrillation ([place]) 12/16/2019  . Hyperlipidemia, unspecified   . Allergic rhinitis due to pollen   . Essential hypertension   . Major depressive disorder, single episode, unspecified   . Unspecified asthma, uncomplicated   . Dysphagia 07/10/2019  . Breast CA ([place]) 01/30/2014   PCP:  [name], NP Pharmacy:   [place], [place] [address] [place] [address] Phone: [phone] Fax: [phone]  [facility] [phone] - [place], [place] [address] [place] [address] Phone: [phone] Fax: [phone]     Social Determinants of Health (SDOH) Interventions    Readmission Risk Interventions Readmission Risk Prevention Plan 07/19/2020 07/11/2020 07/01/2020  Transportation Screening Complete Complete Complete  Medication Review Press photographer) Complete Complete Complete  PCP or Specialist appointment within 3-5 days of discharge - - -  [place] or [place] Complete Complete Complete  SW Recovery Care/Counseling Consult - Complete Complete  Palliative Care Screening - Complete Not [place] Not Applicable Complete Not Applicable  Some recent data might be hidden

## 2020-07-19 NOTE — ED Notes (Addendum)
Pt did not sleep any all night. Has been yelling all night. Did not have any medications to give her

## 2020-07-20 DIAGNOSIS — J9601 Acute respiratory failure with hypoxia: Secondary | ICD-10-CM | POA: Diagnosis not present

## 2020-07-20 LAB — GLUCOSE, CAPILLARY
Glucose-Capillary: 122 mg/dL — ABNORMAL HIGH (ref 70–99)
Glucose-Capillary: 133 mg/dL — ABNORMAL HIGH (ref 70–99)
Glucose-Capillary: 102 mg/dL — ABNORMAL HIGH (ref 70–99)
Glucose-Capillary: 217 mg/dL — ABNORMAL HIGH (ref 70–99)
Glucose-Capillary: 200 mg/dL — ABNORMAL HIGH (ref 70–99)

## 2020-07-20 MED ORDER — ALPRAZOLAM 0.5 MG PO TABS
0.5000 mg | ORAL_TABLET | Freq: Two times a day (BID) | ORAL | Status: DC | PRN
  Administered 2020-07-21: 0.5 mg via ORAL
  Filled 2020-07-20: qty 1

## 2020-07-20 MED ORDER — ACETAMINOPHEN 325 MG PO TABS: 650.0000 mg | ORAL_TABLET | Freq: Four times a day (QID) | ORAL | Status: DC | PRN

## 2020-07-20 NOTE — Consult Note (Addendum)
WOC Nurse Consult Note: Reason for Consult: Pressure injury to left heel, medial aspect (Unstageable) > posterior aspect (Stage 3). Photos uploaded to the EMR are appreciated. Wound type: Pressure Pressure Injury POA: Yes Measurement: To be obtained by Bedside RN prior to application of first dressing change Wound bed: Medial heel Unstageable is with 15% red tissue at periphery of 75% necrotic tissue (black eschar). Posterior heel wound is punctate and with pale pink wound bed  Drainage (amount, consistency, odor) None Periwound: mild erythema Dressing procedure/placement/frequency:I have provided Nursing with conservative guidance for the care of these wounds with the cornerstone of intervention being offloading. Topical care will be twice daily and with an application of an antiseptic/astringent (betadine) swabstick and after the solution dries, the wound can be dressed with  Dry gauze and secured to avoid traumatic removal of the eschar. The feet are to be placed into pressure redistribution heel boots (Prevalon).  The patient is to be turned from side to side and a prophylactic foam dressing used at the sacrum.  [place] nursing team will not follow, but will remain available to this patient, the nursing and medical teams.  Please re-consult if needed. Thanks, Maudie Flakes, MSN, RN, [place], Arther Abbott  Pager# [phone]

## 2020-07-20 NOTE — Plan of Care (Signed)
  Problem: Education: Goal: Knowledge of General Education information will improve Description Including pain rating scale, medication(s)/side effects and non-pharmacologic comfort measures Outcome: Progressing   Problem: Health Behavior/Discharge Planning: Goal: Ability to manage health-related needs will improve Outcome: Progressing   

## 2020-07-20 NOTE — Progress Notes (Signed)
Patient Demographics:    Amy Hoffman, is a 79 y.o. female, DOB - 06/07/1941, PQZ:[id number]  Admit date - 08/02/2020   Admitting Physician Bernadette Hoit, DO  Outpatient Primary MD for the patient is [name], NP  LOS - 2   Chief Complaint  Patient presents with  . Shortness of Breath    Patient complaining of SHOB and edema to BLE for the past two days. Recently discharged with new DX of CHF. Did not obtain RX for diuretic at discharge. Did not follow-up for this on Monday as planned. Reports weeping to BLE.         Subjective:    Amy Hoffman today has no fevers, no emesis,  No chest pain,   -Patient is disoriented, yelling and calling out from time to time -Able to tolerate full liquid diet  Assessment  & Plan :    Principal Problem:   Acute respiratory failure with hypoxia ([place]) Active Problems:   Essential hypertension   CHF (congestive heart failure) (HCC)   Hyperlipidemia associated with type 2 diabetes mellitus (HCC)   Bilateral pleural effusion   Hypoalbuminemia   Prolonged QT interval   Pericardial effusion   Hydronephrosis   Hyperglycemia due to diabetes mellitus (HCC)   Elevated brain natriuretic peptide (BNP) level   Atelectasis of left lung  Brief Summary:-  79 y.o. female with medical history significant for atrial fibrillation, dementia, chronic diastolic CHF, DM2, abnormal uterine bleeding, allergic rhinitis, anxiety, depression, dementia, right breast cancer, HTN, HLD, obesity, GERD and osteoarthritis admitted on 08/02/2020 with fatigue and worsening shortness of breath with oxygen requirement and found to have recurrent left-sided pleural effusion as well as possible UTI  A/p 1) acute on chronic hypoxic respiratory failure--as per patient's daughter patient likely used oxygen on and off previously -Currently requiring 3 L of oxygen via nasal cannula in the setting  of recurrent left-sided pleural effusion and diastolic dysfunction CHF -Procalcitonin is less than 0.10 patient is afebrile, and without leukocytosis, doubt pneumonia-- --Patient's healthcare power of attorney/daughter reluctant to try Lasix at this time -We will consider repeat therapeutic thoracentesis if patient becomes more symptomatic  2)HFpEF-patient with chronic diastolic dysfunction CHF with recurrent left-sided pleural effusion, last thoracentesis was 07/11/2020 with 630 mils of fluid removed-please see #1 above - -Initially there was concern for possible pericardial effusion due to CT chest finding, however echocardiogram on 07/19/2020 without significant pericardial effusion -Echo does show EF of 50 to 55% with regional wall motion normalities, along with severely dilated left atrium and small pericardial effusion -Per cardiology service no further intervention or work-up needed with regards to the small pericardial effusion  3) recurrent UTI--in the setting of hydronephrosis-CT abdomen and pelvis showed mild to moderate right hydronephrosis with a nondilated ureter which may reflect ureteropelvic junction obstruction -c/n IV Rocephin pending urine culture results (patient has Klebsiella UTI few weeks ago) -Give Flomax and consider outpatient follow-up with urology  4) chronic atrial fibrillation--Echo as above #2, with severely dilated left atrium -CHA2DS2-VASc: 6 -Continue Eliquis  -QTc is prolonged so amiodarone is on hold  5)DM2-A1c 7.7 reflecting uncontrolled DM with hyperglycemia -Use Novolog/Humalog Sliding scale insulin with Accu-Cheks/Fingersticks as ordered   6)HTN--hold amlodipine due to soft BP  7)Dementia with  behavioral disturbance -Use Ativan as needed for agitation.  8)Social/Ethics--extensive conversation with patient's daughter and POA about current condition, plan of care and goals of care -Patient is a DNR/DNI without any limitations to treatment at this  time -She remains full scope of treatment -Apparently advanced home health previously recommended to patient's daughter and family that they should consider long-term placement and possibly palliative care/hospice involvement -Patient's daughter and family are not ready to transition to palliative or hospice care at this time -Ideally they would like patient to return home with home health services upon discharge  9)Lt Heel pressure ulcer/wounds-- POA--please see photos in epic, wound care consult requested  Disposition/Need for in-Hospital Stay- patient unable to be discharged at this time due to acute hypoxic respiratory failure secondary to CHF exacerbation and fatigue and malaise secondary to UTI IV antibiotics-  Status is: Inpatient  Remains inpatient appropriate because:acute hypoxic respiratory failure secondary to CHF exacerbation and fatigue and malaise secondary to UTI IV antibiotics-  Disposition: The patient is from: Home              Anticipated d/c is to: Home with [facility]              Anticipated d/c date is: 2 days              Patient currently is not medically stable to d/c. Barriers: Not Clinically Stable- acute hypoxic respiratory failure secondary to CHF exacerbation and fatigue and malaise secondary to UTI IV antibiotics-  Code Status : DNR  Family Communication:  Discussed with  daughter [name] by phone  Consults  :  na  DVT Prophylaxis  : Apixaban  Lab Results  Component Value Date   PLT 330 07/19/2020    Inpatient Medications  Scheduled Meds: . apixaban  2.5 mg Oral BID  . Chlorhexidine Gluconate Cloth  6 each Topical Daily  . insulin aspart  0-15 Units Subcutaneous Q4H  . nystatin  5 mL Oral QID  . simvastatin  20 mg Oral Daily  . tamsulosin  0.4 mg Oral QPC supper   Continuous Infusions: . cefTRIAXone (ROCEPHIN)  IV Stopped (07/19/20 1646)   PRN Meds:.    Anti-infectives (From admission, onward)   Start     Dose/Rate Route  Frequency Ordered Stop   07/19/20 1600  cefTRIAXone (ROCEPHIN) 1 g in sodium chloride 0.9 % 100 mL IVPB        1 g 200 mL/hr over 30 Minutes Intravenous Every 24 hours 07/19/20 1536          Objective:   Vitals:   07/20/20 0600 07/20/20 0700 07/20/20 0852 07/20/20 1427  BP: (!) 135/49 (!) 118/47 (!) 123/57 (!) 107/43  Pulse: 79 73 74 (!) 51  Resp: (!) 22 19 16 18   Temp:   97.6 F (36.4 C) 97.8 F (36.6 C)  TempSrc:   Oral   SpO2: 94% 97% 100% 98%  Weight:      Height:        Wt Readings from Last 3 Encounters:  07/31/2020 68 kg  07/10/20 68 kg  07/09/20 60.3 kg     Intake/Output Summary (Last 24 hours) at 07/20/2020 1549 Last data filed at 07/20/2020 0600 Gross per 24 hour  Intake 100 ml  Output 450 ml  Net -350 ml     Physical Exam  Gen:- Awake Alert,  In no apparent distress  HEENT:- [place].AT, No sclera icterus Nose- [place] 3L/min Neck-Supple Neck,No JVD,.  Lungs-diminished  breath sounds on the left, faint bibasilar rales,  CV- S1, S2 normal, irregular  Abd-  +ve B.Sounds, Abd Soft, No tenderness,    Extremity/Skin:-  pedal pulses present  Psych-affect is flat, episodes of disorientation and confusion  Neuro-generalized weakness, no new focal deficits, no tremors Msk:-Lt Heel pressure ulcer/wounds, necrotic eschar noted-please see photos in epic   Data Review:   Micro Results Recent Results (from the past 240 hour(s))  Urine Culture     Status: None   Collection Time: 07/10/20  7:15 PM   Specimen: Urine, Clean Catch  Result Value Ref Range Status   Specimen Description   Final    URINE, CLEAN CATCH Performed at [facility], [address], [place], [place] [address]    Special Requests   Final    NONE Performed at [facility], [address], [place], [place] [address]    Culture   Final    NO GROWTH Performed at [facility], [place] [address] [place], [place] [address]    Report Status 07/12/2020 FINAL  Final  Gram stain     Status: None    Collection Time: 07/11/20 11:06 AM   Specimen: Pleura; Body Fluid  Result Value Ref Range Status   Specimen Description PLEURAL  Final   Special Requests NONE  Final   Gram Stain   Final    NO ORGANISMS SEEN WBC PRESENT, PREDOMINANTLY MONONUCLEAR Performed at [facility], [address], [place], [place] [address]    Report Status 07/11/2020 FINAL  Final  Urine Culture     Status: None (Preliminary result)   Collection Time: 07/28/2020 11:30 AM  Result Value Ref Range Status   Source: URINE  Preliminary   Status: PRELIMINARY  Preliminary   Result: Culture in progress  Preliminary  Resp Panel by RT PCR (RSV, Flu A&B, Covid) - Nasopharyngeal Swab     Status: None   Collection Time: 07/30/2020  3:25 PM   Specimen: Nasopharyngeal Swab  Result Value Ref Range Status   SARS Coronavirus 2 by RT PCR NEGATIVE NEGATIVE Final    Comment: (NOTE) SARS-CoV-2 target nucleic acids are NOT DETECTED.  The SARS-CoV-2 RNA is generally detectable in upper respiratoy specimens during the acute phase of infection. The lowest concentration of SARS-CoV-2 viral copies this assay can detect is 131 copies/mL. A negative result does not preclude SARS-Cov-2 infection and should not be used as the sole basis for treatment or other patient management decisions. A negative result may occur with  improper specimen collection/handling, submission of specimen other than nasopharyngeal swab, presence of viral mutation(s) within the areas targeted by this assay, and inadequate number of viral copies (<131 copies/mL). A negative result must be combined with clinical observations, patient history, and epidemiological information. The expected result is Negative.  Fact Sheet for Patients:  PinkCheek.be  Fact Sheet for Healthcare Providers:  GravelBags.it  This test is no t yet approved or cleared by the [place] FDA and  has been authorized for detection  and/or diagnosis of SARS-CoV-2 by FDA under an Emergency Use Authorization (EUA). This EUA will remain  in effect (meaning this test can be used) for the duration of the COVID-19 declaration under Section 564(b)(1) of the Act, 21 U.S.C. section 360bbb-3(b)(1), unless the authorization is terminated or revoked sooner.     Influenza A by PCR NEGATIVE NEGATIVE Final   Influenza B by PCR NEGATIVE NEGATIVE Final    Comment: (NOTE) The Xpert Xpress SARS-CoV-2/FLU/RSV assay is intended  as an aid in  the diagnosis of influenza from Nasopharyngeal swab specimens and  should not be used as a sole basis for treatment. Nasal washings and  aspirates are unacceptable for Xpert Xpress SARS-CoV-2/FLU/RSV  testing.  Fact Sheet for Patients: PinkCheek.be  Fact Sheet for Healthcare Providers: GravelBags.it  This test is not yet approved or cleared by the [place] FDA and  has been authorized for detection and/or diagnosis of SARS-CoV-2 by  FDA under an Emergency Use Authorization (EUA). This EUA will remain  in effect (meaning this test can be used) for the duration of the  Covid-19 declaration under Section 564(b)(1) of the Act, 21  U.S.C. section 360bbb-3(b)(1), unless the authorization is  terminated or revoked.    Respiratory Syncytial Virus by PCR NEGATIVE NEGATIVE Final    Comment: (NOTE) Fact Sheet for Patients: PinkCheek.be  Fact Sheet for Healthcare Providers: GravelBags.it  This test is not yet approved or cleared by the [place] FDA and  has been authorized for detection and/or diagnosis of SARS-CoV-2 by  FDA under an Emergency Use Authorization (EUA). This EUA will remain  in effect (meaning this test can be used) for the duration of the  COVID-19 declaration under Section 564(b)(1) of the Act, 21 U.S.C.  section 360bbb-3(b)(1), unless the authorization is  terminated or  revoked. Performed at [facility], [address], [place], [place] [address]     Radiology Reports DG Chest 1 View  Result Date: 07/11/2020 CLINICAL DATA:  LEFT pleural effusion post thoracentesis EXAM: CHEST  1 VIEW COMPARISON:  07/10/2020 FINDINGS: Enlargement of cardiac silhouette with pulmonary vascular congestion. Atherosclerotic calcification aorta. Small bibasilar pleural effusions and atelectasis. Improved aeration and decreased pleural effusions/atelectasis at LEFT base since prior exam. No pneumothorax. Bones demineralized. IMPRESSION: No pneumothorax following LEFT thoracentesis. Electronically Signed   By: Lavonia Dana M.D.   On: 07/11/2020 11:36   DG Chest 2 View  Result Date: 07/10/2020 CLINICAL DATA:  79 year old female with altered mental status. EXAM: CHEST - 2 VIEW COMPARISON:  Chest radiograph dated 06/30/2020. FINDINGS: There are bilateral pleural effusions, left greater right, which have increased in size compared to the prior radiograph. Bibasilar atelectasis or infiltrate, also increased since the prior radiograph. No pneumothorax. Stable cardiomegaly. Atherosclerotic calcification of the aorta. No acute osseous pathology. Surgical clips over the right chest as well as sclerotic lesion of the proximal left humeral diaphysis similar to prior radiograph. IMPRESSION: Bilateral pleural effusions, left greater right, and bibasilar atelectasis or infiltrate, increased since the prior radiograph. Electronically Signed   By: Anner Crete M.D.   On: 07/10/2020 15:51   DG Chest 2 View  Result Date: 06/30/2020 CLINICAL DATA:  Foul-smelling urine and abnormal EKG EXAM: CHEST - 2 VIEW COMPARISON:  02/13/2020 FINDINGS: Cardiac shadow is enlarged. Aortic calcifications are noted. Lungs are well aerated bilaterally. Chronic blunting of the right costophrenic angle is noted. No new focal infiltrate is seen. Mild central vascular congestion is noted. IMPRESSION: Changes of  mild CHF without edema. Chronic changes in the right base. Electronically Signed   By: Inez Catalina M.D.   On: 06/30/2020 15:32   CT HEAD WO CONTRAST  Result Date: 07/01/2020 CLINICAL DATA:  Altered mental status on top of baseline dementia. Chronic anticoagulation. EXAM: CT HEAD WITHOUT CONTRAST TECHNIQUE: Contiguous axial images were obtained from the base of the skull through the vertex without intravenous contrast. COMPARISON:  None. FINDINGS: Brain: Examination is technically limited by motion artifact. There is evidence of diffuse cerebral  atrophy. Ventricular dilatation is likely due to central atrophy. Low-attenuation changes in the deep white matter likely represent small vessel ischemic change. No obvious mass effect or midline shift. No acute intracranial hemorrhage is demonstrated. No abnormal extra-axial fluid collections. Vascular: No acute vascular abnormality is identified. Skull: Visualized calvarium appears intact. Sinuses/Orbits: Visualized paranasal sinuses and mastoid air cells are clear. Other: None. IMPRESSION: 1. Technically limited study due to motion artifact. 2. No acute intracranial abnormalities are demonstrated. 3. Chronic atrophy and small vessel ischemic changes. Electronically Signed   By: Lucienne Capers M.D.   On: 07/01/2020 00:43   CT Angio Chest PE W and/or Wo Contrast  Result Date: 07/20/2020 CLINICAL DATA:  Dementia patient with shortness of breath and abdominal pain. EXAM: CT ANGIOGRAPHY CHEST CT ABDOMEN AND PELVIS WITH CONTRAST TECHNIQUE: Multidetector CT imaging of the chest was performed using the standard protocol during bolus administration of intravenous contrast. Multiplanar CT image reconstructions and MIPs were obtained to evaluate the vascular anatomy. Multidetector CT imaging of the abdomen and pelvis was performed using the standard protocol during bolus administration of intravenous contrast. CONTRAST:  87mL OMNIPAQUE IOHEXOL 350 MG/ML SOLN COMPARISON:   Same day chest radiograph, CT chest dated 02/06/2020, CT abdomen pelvis dated 02/04/2019. FINDINGS: CTA CHEST FINDINGS Cardiovascular: Satisfactory opacification of the pulmonary arteries to the segmental level. No evidence of pulmonary embolism. Vascular calcifications are seen in the coronary arteries and aortic arch. There heart is enlarged and there is a moderate to large pericardial effusion. Mediastinum/Nodes: No enlarged mediastinal, hilar, or axillary lymph nodes. Aspirated debris is seen in the trachea (series 8 images 30 3-38). Thyroid gland and esophagus demonstrate no significant findings. Lungs/Pleura: There is a large left and a small right pleural effusion with associated atelectasis. There are mild scattered bilateral ground-glass opacities. There is no pneumothorax. Musculoskeletal: Degenerative changes are seen in the spine. Review of the MIP images confirms the above findings. CT ABDOMEN and PELVIS FINDINGS Hepatobiliary: No focal liver abnormality is seen. The patient is status post cholecystectomy. There is moderate intra and extrahepatic biliary ductal dilatation with the common bile duct measuring up to 17 mm in size. Pancreas: Unremarkable. No pancreatic ductal dilatation or surrounding inflammatory changes. Spleen: Normal in size without focal abnormality. Adrenals/Urinary Tract: Adrenal glands are unremarkable. There is mild-to-moderate right hydronephrosis with a nondilated ureter which may reflect ureteropelvic junction obstruction. There is no hydronephrosis on the left. No renal calculi or focal renal lesion is identified on either side. Bladder is unremarkable. Stomach/Bowel: Stomach is within normal limits. No pericecal inflammatory changes are noted to suggest acute appendicitis. There is colonic diverticulosis without evidence of diverticulitis. No evidence of bowel wall thickening, distention, or inflammatory changes. Vascular/Lymphatic: Aortic atherosclerosis. No enlarged  abdominal or pelvic lymph nodes. Reproductive: Status post hysterectomy. No adnexal masses. Other: No abdominal wall hernia or abnormality. No abdominopelvic ascites. Anasarca is noted. Musculoskeletal: Degenerative changes are seen in the spine. There is 6 mm anterolisthesis of L4 on L5, not significantly changed since 12/05/2018. Review of the MIP images confirms the above findings. IMPRESSION: 1. No evidence of pulmonary embolism. 2. Large left and small right pleural effusions with associated atelectasis. 3. Mild scattered bilateral ground-glass opacities likely represent atelectasis and/or pneumonia. 4. Cardiomegaly with a moderate to large pericardial effusion. 5. Moderate intra and extrahepatic biliary ductal dilatation with the common bile duct measuring up to 17 mm in size. This may reflect post cholecystectomy state and patient age. Correlation with liver function tests is recommended. 6.  Mild-to-moderate right hydronephrosis with a nondilated ureter which may reflect ureteropelvic junction obstruction. Aortic Atherosclerosis (ICD10-I70.0). Electronically Signed   By: Zerita Boers M.D.   On: 08/09/2020 18:08   CT ABDOMEN PELVIS W CONTRAST  Result Date: 08/04/2020 CLINICAL DATA:  Dementia patient with shortness of breath and abdominal pain. EXAM: CT ANGIOGRAPHY CHEST CT ABDOMEN AND PELVIS WITH CONTRAST TECHNIQUE: Multidetector CT imaging of the chest was performed using the standard protocol during bolus administration of intravenous contrast. Multiplanar CT image reconstructions and MIPs were obtained to evaluate the vascular anatomy. Multidetector CT imaging of the abdomen and pelvis was performed using the standard protocol during bolus administration of intravenous contrast. CONTRAST:  38mL OMNIPAQUE IOHEXOL 350 MG/ML SOLN COMPARISON:  Same day chest radiograph, CT chest dated 02/06/2020, CT abdomen pelvis dated 02/04/2019. FINDINGS: CTA CHEST FINDINGS Cardiovascular: Satisfactory opacification of  the pulmonary arteries to the segmental level. No evidence of pulmonary embolism. Vascular calcifications are seen in the coronary arteries and aortic arch. There heart is enlarged and there is a moderate to large pericardial effusion. Mediastinum/Nodes: No enlarged mediastinal, hilar, or axillary lymph nodes. Aspirated debris is seen in the trachea (series 8 images 30 3-38). Thyroid gland and esophagus demonstrate no significant findings. Lungs/Pleura: There is a large left and a small right pleural effusion with associated atelectasis. There are mild scattered bilateral ground-glass opacities. There is no pneumothorax. Musculoskeletal: Degenerative changes are seen in the spine. Review of the MIP images confirms the above findings. CT ABDOMEN and PELVIS FINDINGS Hepatobiliary: No focal liver abnormality is seen. The patient is status post cholecystectomy. There is moderate intra and extrahepatic biliary ductal dilatation with the common bile duct measuring up to 17 mm in size. Pancreas: Unremarkable. No pancreatic ductal dilatation or surrounding inflammatory changes. Spleen: Normal in size without focal abnormality. Adrenals/Urinary Tract: Adrenal glands are unremarkable. There is mild-to-moderate right hydronephrosis with a nondilated ureter which may reflect ureteropelvic junction obstruction. There is no hydronephrosis on the left. No renal calculi or focal renal lesion is identified on either side. Bladder is unremarkable. Stomach/Bowel: Stomach is within normal limits. No pericecal inflammatory changes are noted to suggest acute appendicitis. There is colonic diverticulosis without evidence of diverticulitis. No evidence of bowel wall thickening, distention, or inflammatory changes. Vascular/Lymphatic: Aortic atherosclerosis. No enlarged abdominal or pelvic lymph nodes. Reproductive: Status post hysterectomy. No adnexal masses. Other: No abdominal wall hernia or abnormality. No abdominopelvic ascites.  Anasarca is noted. Musculoskeletal: Degenerative changes are seen in the spine. There is 6 mm anterolisthesis of L4 on L5, not significantly changed since 12/05/2018. Review of the MIP images confirms the above findings. IMPRESSION: 1. No evidence of pulmonary embolism. 2. Large left and small right pleural effusions with associated atelectasis. 3. Mild scattered bilateral ground-glass opacities likely represent atelectasis and/or pneumonia. 4. Cardiomegaly with a moderate to large pericardial effusion. 5. Moderate intra and extrahepatic biliary ductal dilatation with the common bile duct measuring up to 17 mm in size. This may reflect post cholecystectomy state and patient age. Correlation with liver function tests is recommended. 6. Mild-to-moderate right hydronephrosis with a nondilated ureter which may reflect ureteropelvic junction obstruction. Aortic Atherosclerosis (ICD10-I70.0). Electronically Signed   By: Zerita Boers M.D.   On: 07/28/2020 18:08   DG Chest Port 1 View  Result Date: 08/07/2020 CLINICAL DATA:  Shortness of breath. EXAM: PORTABLE CHEST 1 VIEW COMPARISON:  Radiograph 07/11/2020.  CT 02/06/2020 FINDINGS: Stable cardiomegaly. Unchanged mediastinal contours. Bilateral pleural effusions, not significantly changed on the right and  increased on the left. Increased left basilar airspace disease typically atelectasis. Vascular congestion is similar. There is no pneumothorax. Stable osseous structures. Surgical clips in the right axilla. IMPRESSION: 1. Increased left pleural effusion with progressive left basilar airspace disease over the past week, typically atelectasis. 2. No significant change in right pleural effusion. 3. Stable cardiomegaly and vascular congestion. Electronically Signed   By: Keith Rake M.D.   On: 07/31/2020 16:03   ECHOCARDIOGRAM COMPLETE  Result Date: 07/19/2020    ECHOCARDIOGRAM REPORT   Patient Name:   ADALENA ABDULLA Dara Date of Exam: 07/19/2020 Medical Rec #:   [id number]       Height:       65.0 in Accession #:    [id number]      Weight:       149.9 lb Date of Birth:  [birth year]       BSA:          1.750 m Patient Age:    25 years        BP:           125/75 mmHg Patient Gender: F               HR:           107 bpm. Exam Location:  [facility] Procedure: 2D Echo Indications:    Pericardial effusion 423.9 / I31.3  History:        Patient has prior history of Echocardiogram examinations, most                 recent 12/17/2019. CHF, Pericardial Disease; Risk                 Factors:Hypertension, Dyslipidemia, Non-Smoker and Diabetes.                 Dementia, Bradycardia.  Sonographer:    Leavy Cella RDCS (AE) Referring Phys: [id number] OLADAPO ADEFESO IMPRESSIONS  1. Left ventricular ejection fraction, by estimation, is 50 to 55%. The left ventricle has low normal function. The left ventricle demonstrates regional wall motion abnormalities (see scoring diagram/findings for description). Left ventricular diastolic  parameters are indeterminate.  2. Right ventricular systolic function is low normal. The right ventricular size is normal.  3. Left atrial size was severely dilated.  4. A small pericardial effusion is present. The pericardial effusion is circumferential. There is no evidence of cardiac tamponade.  5. The mitral valve is normal in structure. Trivial mitral valve regurgitation. No evidence of mitral stenosis.  6. The aortic valve is tricuspid. Aortic valve regurgitation is not visualized. No aortic stenosis is present. FINDINGS  Left Ventricle: Left ventricular ejection fraction, by estimation, is 50 to 55%. The left ventricle has low normal function. The left ventricle demonstrates regional wall motion abnormalities. The left ventricular internal cavity size was normal in size. There is no left ventricular hypertrophy. Left ventricular diastolic parameters are indeterminate. Right Ventricle: The right ventricular size is normal. No increase in right ventricular wall  thickness. Right ventricular systolic function is low normal. Left Atrium: Left atrial size was severely dilated. Right Atrium: Right atrial size was normal in size. Pericardium: A small pericardial effusion is present. The pericardial effusion is circumferential. There is no evidence of cardiac tamponade. Mitral Valve: The mitral valve is normal in structure. Trivial mitral valve regurgitation. No evidence of mitral valve stenosis. Tricuspid Valve: The tricuspid valve is normal in structure. Tricuspid valve regurgitation is mild . No evidence of tricuspid stenosis. Aortic Valve:  The aortic valve is tricuspid. Aortic valve regurgitation is not visualized. No aortic stenosis is present. Aortic valve mean gradient measures 4.1 mmHg. Aortic valve peak gradient measures 6.6 mmHg. Aortic valve area, by VTI measures 1.43 cm. Pulmonic Valve: The pulmonic valve was not well visualized. Pulmonic valve regurgitation is not visualized. No evidence of pulmonic stenosis. Aorta: The aortic root is normal in size and structure. Pulmonary Artery: 34.  Venous: The inferior vena cava was not well visualized. IAS/Shunts: The interatrial septum was not well visualized.  LEFT VENTRICLE PLAX 2D LVIDd:         3.91 cm  Diastology LVIDs:         3.08 cm  LV e' medial:    5.22 cm/s LV PW:         0.90 cm  LV E/e' medial:  17.4 LV IVS:        0.88 cm  LV e' lateral:   5.15 cm/s LVOT diam:     1.90 cm  LV E/e' lateral: 17.6 LV SV:         45 LV SV Index:   26 LVOT Area:     2.84 cm  RIGHT VENTRICLE RV S prime:     6.18 cm/s TAPSE (M-mode): 1.6 cm LEFT ATRIUM             Index       RIGHT ATRIUM           Index LA diam:        4.40 cm 2.51 cm/m  RA Area:     15.80 cm LA Vol (A2C):   61.0 ml 34.86 ml/m RA Volume:   39.80 ml  22.74 ml/m LA Vol (A4C):   72.0 ml 41.14 ml/m LA Biplane Vol: 68.8 ml 39.31 ml/m  AORTIC VALVE AV Area (Vmax):    1.44 cm AV Area (Vmean):   1.40 cm AV Area (VTI):     1.43 cm AV Vmax:           128.76 cm/s AV  Vmean:          95.477 cm/s AV VTI:            0.313 m AV Peak Grad:      6.6 mmHg AV Mean Grad:      4.1 mmHg LVOT Vmax:         65.55 cm/s LVOT Vmean:        47.156 cm/s LVOT VTI:          0.158 m LVOT/AV VTI ratio: 0.50  AORTA Ao Root diam: 2.60 cm MITRAL VALVE               TRICUSPID VALVE MV Area (PHT): 2.90 cm    TR Peak grad:   33.6 mmHg MV Decel Time: 262 msec    TR Vmax:        290.00 cm/s MV E velocity: 90.80 cm/s MV A velocity: 66.00 cm/s  SHUNTS MV E/A ratio:  1.38        Systemic VTI:  0.16 m                            Systemic Diam: 1.90 cm Carlyle Dolly MD Electronically signed by Carlyle Dolly MD Signature Date/Time: 07/19/2020/12:39:52 PM    Final    US THORACENTESIS ASP PLEURAL SPACE W/IMG GUIDE  Result Date: 07/11/2020 INDICATION: LEFT pleural effusion EXAM: ULTRASOUND GUIDED DIAGNOSTIC AND THERAPEUTIC THORACENTESIS MEDICATIONS:  None COMPLICATIONS: None immediate PROCEDURE: An ultrasound guided thoracentesis was thoroughly discussed with the patient and questions answered. The benefits, risks, alternatives and complications were also discussed. The patient understands and wishes to proceed with the procedure. Written consent was obtained. Ultrasound was performed to localize and mark an adequate pocket of fluid in the LEFT chest. The area was then prepped and draped in the normal sterile fashion. 1% Lidocaine was used for local anesthesia. Under ultrasound guidance a 8 French thoracentesis catheter was introduced. Thoracentesis was performed. The catheter was removed and a dressing applied. FINDINGS: A total of approximately 630 mL of yellow LEFT pleural fluid was removed. Samples were sent to the laboratory as requested by the clinical team. IMPRESSION: Successful ultrasound guided LEFT thoracentesis yielding 630 mL of pleural fluid. Electronically Signed   By: Lavonia Dana M.D.   On: 07/11/2020 11:35     CBC Recent Labs  Lab 07/25/2020 1500 07/19/20 0351  WBC 9.9 9.5  HGB 10.4*  10.0*  HCT 35.1* 35.7*  PLT 315 330  MCV 87.5 90.6  MCH 25.9* 25.4*  MCHC 29.6* 28.0*  RDW 16.2* 16.3*  LYMPHSABS 0.8  --   MONOABS 0.8  --   EOSABS 0.1  --   BASOSABS 0.0  --     Chemistries  Recent Labs  Lab 07/30/2020 1500 07/19/20 0351  NA 142 145  K 4.6 4.8  CL 107 108  CO2 30 29  GLUCOSE 183* 167*  BUN 22 18  CREATININE 0.80 0.70  CALCIUM 8.8* 8.9  MG  --  2.0  AST 22 20  ALT 35 33  ALKPHOS 106 95  BILITOT 0.9 0.7   ------------------------------------------------------------------------------------------------------------------ No results for input(s): CHOL, HDL, LDLCALC, TRIG, CHOLHDL, LDLDIRECT in the last 72 hours.  Lab Results  Component Value Date   HGBA1C 7.7 (H) 06/30/2020   ------------------------------------------------------------------------------------------------------------------ No results for input(s): TSH, T4TOTAL, T3FREE, THYROIDAB in the last 72 hours.  Invalid input(s): FREET3 ------------------------------------------------------------------------------------------------------------------ No results for input(s): VITAMINB12, FOLATE, FERRITIN, TIBC, IRON, RETICCTPCT in the last 72 hours.  Coagulation profile Recent Labs  Lab 07/19/20 0351  INR 1.3*    No results for input(s): DDIMER in the last 72 hours.  Cardiac Enzymes No results for input(s): CKMB, TROPONINI, MYOGLOBIN in the last 168 hours.  Invalid input(s): CK ------------------------------------------------------------------------------------------------------------------    Component Value Date/Time   BNP 375.0 (H) 08/10/2020 1500     Roxan Hockey M.D on 07/20/2020 at 3:49 PM  Go to [email or web address] - for contact info  [facility] - Office  [phone]

## 2020-07-21 ENCOUNTER — Inpatient Hospital Stay (HOSPITAL_COMMUNITY): Payer: Medicare Other

## 2020-07-21 DIAGNOSIS — J9601 Acute respiratory failure with hypoxia: Secondary | ICD-10-CM | POA: Diagnosis not present

## 2020-07-21 LAB — URINALYSIS W MICROSCOPIC + REFLEX CULTURE
Bilirubin Urine: NEGATIVE
Hgb urine dipstick: NEGATIVE
Ketones, ur: NEGATIVE
Nitrites, Initial: NEGATIVE
Protein, ur: NEGATIVE
Specific Gravity, Urine: 1.017 (ref 1.001–1.03)
pH: 5.5 (ref 5.0–8.0)

## 2020-07-21 LAB — CULTURE INDICATED

## 2020-07-21 LAB — BASIC METABOLIC PANEL
Anion gap: 8 (ref 5–15)
BUN: 27 mg/dL — ABNORMAL HIGH (ref 8–23)
CO2: 29 mmol/L (ref 22–32)
Calcium: 8.6 mg/dL — ABNORMAL LOW (ref 8.9–10.3)
Chloride: 107 mmol/L (ref 98–111)
Creatinine, Ser: 1.34 mg/dL — ABNORMAL HIGH (ref 0.44–1.00)
GFR, Estimated: 40 mL/min — ABNORMAL LOW (ref >60)
Glucose, Bld: 143 mg/dL — ABNORMAL HIGH (ref 70–99)
Potassium: 4.8 mmol/L (ref 3.5–5.1)
Sodium: 144 mmol/L (ref 135–145)

## 2020-07-21 LAB — CBC
HCT: 31 % — ABNORMAL LOW (ref 36.0–46.0)
Hemoglobin: 8.6 g/dL — ABNORMAL LOW (ref 12.0–15.0)
MCH: 25.9 pg — ABNORMAL LOW (ref 26.0–34.0)
MCHC: 27.7 g/dL — ABNORMAL LOW (ref 30.0–36.0)
MCV: 93.4 fL (ref 80.0–100.0)
Platelets: 220 10*3/uL (ref 150–400)
RBC: 3.32 MIL/uL — ABNORMAL LOW (ref 3.87–5.11)
RDW: 16.4 % — ABNORMAL HIGH (ref 11.5–15.5)
WBC: 11.5 10*3/uL — ABNORMAL HIGH (ref 4.0–10.5)
nRBC: 0 % (ref 0.0–0.2)

## 2020-07-21 LAB — BLOOD GAS, ARTERIAL
Acid-Base Excess: 1.7 mmol/L (ref 0.0–2.0)
Bicarbonate: 25.2 mmol/L (ref 20.0–28.0)
FIO2: 40
O2 Saturation: 97.8 %
Patient temperature: 37
pCO2 arterial: 65.9 mmHg (ref 32.0–48.0)
pH, Arterial: 7.255 — ABNORMAL LOW (ref 7.350–7.450)
pO2, Arterial: 107 mmHg (ref 83.0–108.0)

## 2020-07-21 LAB — URINE CULTURE

## 2020-07-21 LAB — GLUCOSE, CAPILLARY
Glucose-Capillary: 149 mg/dL — ABNORMAL HIGH (ref 70–99)
Glucose-Capillary: 138 mg/dL — ABNORMAL HIGH (ref 70–99)
Glucose-Capillary: 111 mg/dL — ABNORMAL HIGH (ref 70–99)
Glucose-Capillary: 170 mg/dL — ABNORMAL HIGH (ref 70–99)
Glucose-Capillary: 106 mg/dL — ABNORMAL HIGH (ref 70–99)
Glucose-Capillary: 101 mg/dL — ABNORMAL HIGH (ref 70–99)

## 2020-07-21 MED ORDER — ATROPINE SULFATE 1 MG/ML IJ SOLN
0.4000 mg | Freq: Once | INTRAMUSCULAR | Status: AC
  Administered 2020-07-21: 0.4 mg via INTRAVENOUS
  Filled 2020-07-21: qty 1

## 2020-07-21 MED ORDER — SODIUM CHLORIDE 0.9 % IV BOLUS
250.0000 mL | Freq: Once | INTRAVENOUS | Status: AC
  Administered 2020-07-21: 250 mL via INTRAVENOUS

## 2020-07-21 MED ORDER — RESOURCE THICKENUP CLEAR PO POWD
ORAL | Status: DC | PRN
  Filled 2020-07-21: qty 125

## 2020-07-21 MED ORDER — SODIUM CHLORIDE 0.9 % IV SOLN: INTRAVENOUS | Status: AC

## 2020-07-21 MED ORDER — AMIODARONE HCL 200 MG PO TABS
100.0000 mg | ORAL_TABLET | Freq: Every day | ORAL | Status: DC
  Administered 2020-07-21: 100 mg via ORAL
  Filled 2020-07-21: qty 1

## 2020-07-21 NOTE — Progress Notes (Signed)
°  Pt's husband who lives locally is Now at pt's bedside - Pt's heart rate and BP has improved after earlier interventions, however hypoxia persist and her mentation has not improved much   -Husband trying to talk to pt, husband reminding pt that today is her 79th birthday -- Pt is without significant verbal response at this time  -Husband's had questions for me,  questions answered Husband remains at bedside - Will continue to monitor  Roxan Hockey, MD

## 2020-07-21 NOTE — Progress Notes (Signed)
° °  I had tried previously to reach patient's daughter Amy Hoffman to give update on patient's condition today - I left a couple of messages for her a couple of hours apart earlier today  I just called Ms Amy Hoffman (daughter/HCPOA)  again now at [phone] to Notify her of patients condition today, Pt's HCPOA acknowledges that she did receive Both of my voicemail messages earlier today  She stated that she was in and out of [facility] hospital earlier today to see her mother and that she in back in [employer] now  -  I had previously d/w her that her mother was very sick and that palliative and hospice consult will be helpful---  - She states that she is now open to further palliative and hospice conversation, however requested that we continue Full scope of treatment at this time  -She stated that she intends to talk to Hospice on Monday 07/22/20 - I explained to her that her mother was very sick and I am Not sure how much time we have   - Amy Hoffman this evening has become more bradycardic leading to hypotension and hypoxia  Please see EKGs done today  I have given pt IVF (NS) boluses for Hypotension (systolic in the 75Q) Gave Atropine 0.4 mg ix x 2 doses for symptomatic bradycardia Increased supplemental oxygen for worsening Hypoxia - Plan was for image guided Thoracentesis in am (orders previously placed) -- We will continue to monitor patient and treat   - Please see full progress note from earlier today  Overall Prognosis remains poor -- Total care time including  phoneconference with daughter and daughter's husband is >>> 43 minutes  Roxan Hockey, MD       -

## 2020-07-21 NOTE — Progress Notes (Signed)
   07/21/20 1758  Assess: MEWS Score  BP (!) 75/47  Pulse Rate (!) 35  Resp 17  Level of Consciousness Responds to Pain  SpO2 91 %  O2 Device Nasal Cannula  O2 Flow Rate (L/min) 3 L/min  Assess: MEWS Score  MEWS Temp 0  MEWS Systolic 2  MEWS Pulse 2  MEWS RR 0  MEWS LOC 2  MEWS Score 6  MEWS Score Color Red  Assess: if the MEWS score is Yellow or Red  Were vital signs taken at a resting state? Yes  Focused Assessment Change from prior assessment (see assessment flowsheet)  Early Detection of Sepsis Score *See Row Information* Low  MEWS guidelines implemented *See Row Information* Yes  Treat  MEWS Interventions Other (Comment) (MD paged )  Take Vital Signs  Increase Vital Sign Frequency  Red: Q 1hr X 4 then Q 4hr X 4, if remains red, continue Q 4hrs  Escalate  MEWS: Escalate Red: discuss with charge nurse/RN and provider, consider discussing with RRT  Notify: Charge Nurse/RN  Name of Charge Nurse/RN Notified [place]   Date Charge Nurse/RN Notified 07/21/20  Time Charge Nurse/RN Notified 3295  Notify: Provider  Provider Name/Title Dr. Joesph Fillers  Date Provider Notified 07/21/20  Time Provider Notified 1758  Notification Type [place]  Notification Reason Change in status  Response See new orders  Date of Provider Response 07/21/20  Time of Provider Response 1759

## 2020-07-21 NOTE — Progress Notes (Addendum)
Patient Demographics:    Ruthetta Koopmann, is a 79 y.o. female, DOB - 24-Jul-1941, ZOX:[id number]  Admit date - 08/11/2020   Admitting Physician Bernadette Hoit, DO  Outpatient Primary MD for the patient is [name], NP  LOS - 3   Chief Complaint  Patient presents with  . Shortness of Breath    Patient complaining of SHOB and edema to BLE for the past two days. Recently discharged with new DX of CHF. Did not obtain RX for diuretic at discharge. Did not follow-up for this on Monday as planned. Reports weeping to BLE.         Subjective:    Tashina Cuthrell today has no fevers, no emesis,  No chest pain,   Remains confused  -calling out often (says Help me) 07/21/20 -Attempted to call daughter x 2---left voicemail twice  Assessment  & Plan :    Principal Problem:   Acute respiratory failure with hypoxia ([place]) Active Problems:   Essential hypertension   CHF (congestive heart failure) (HCC)   Hyperlipidemia associated with type 2 diabetes mellitus (HCC)   Bilateral pleural effusion   Hypoalbuminemia   Prolonged QT interval   Pericardial effusion   Hydronephrosis   Hyperglycemia due to diabetes mellitus (HCC)   Elevated brain natriuretic peptide (BNP) level   Atelectasis of left lung  Brief Summary:-  79 y.o. female with medical history significant for atrial fibrillation, dementia, chronic diastolic CHF, DM2, abnormal uterine bleeding, allergic rhinitis, anxiety, depression, dementia, right breast cancer, HTN, HLD, obesity, GERD and osteoarthritis admitted on 07/16/2020 with fatigue and worsening shortness of breath with oxygen requirement and found to have recurrent left-sided pleural effusion as well as possible UTI  A/p 1) acute on chronic hypoxic respiratory failure--as per patient's daughter patient likely used oxygen on and off previously -Currently requiring 3 L of oxygen via nasal cannula  in the setting of recurrent left-sided pleural effusion and diastolic dysfunction CHF -Procalcitonin is less than 0.10 patient is afebrile, and without leukocytosis, doubt pneumonia-- --Patient's healthcare power of attorney/daughter reluctant to try Lasix at this time -Check chest x-ray on 07/22/2020 and if enough fluid we will Plan for repeat therapeutic thoracentesis on 07/22/20  2)HFpEF-patient with chronic diastolic dysfunction CHF with recurrent left-sided pleural effusion, last thoracentesis was 07/11/2020 with 630 mils of fluid removed-please see #1 above - -Initially there was concern for possible pericardial effusion due to CT chest finding, however echocardiogram on 07/19/2020 without significant pericardial effusion -Echo does show EF of 50 to 55% with regional wall motion normalities, along with severely dilated left atrium and small pericardial effusion -Per cardiology service no further intervention or work-up needed with regards to the small pericardial effusion  3) recurrent UTI--in the setting of hydronephrosis-CT abdomen and pelvis showed mild to moderate right hydronephrosis with a nondilated ureter which may reflect ureteropelvic junction obstruction -There was suspicion of recurrent UTI this admission -patient has Klebsiella UTI few weeks ago -Urine culture from 07/30/2020 without growth, probably contaminated sample, -Patient has empirically been treated with IV Rocephin for at least 3 days okay to discontinue Rocephin on 07/21/2020 -Continue Flomax and consider outpatient follow-up with urology  4) chronic atrial fibrillation--Echo as above #2, with severely dilated left atrium -CHA2DS2-VASc: 6 -Continue Eliquis  -  EKG on 07/27/2020 with a QTC of 588, after stopping BuSpar, and previously stopping Aricept QTC appears to have improved - repeat EKG today with a QTC of 430, okay to restart amiodarone at 100 mg daily  5)DM2-A1c 7.7 reflecting uncontrolled DM with  hyperglycemia -Use Novolog/Humalog Sliding scale insulin with Accu-Cheks/Fingersticks as ordered   6)HTN--hold amlodipine due to soft BP  7)Dementia with behavioral disturbance -Use Ativan as needed for agitation. --EKG on 07/19/2020 with a QTC of 588, after stopping BuSpar, and previously stopping Aricept QTC appears to have improved -Will not restart Aricept or BuSpar due to concerns about QT prolongation  8)Social/Ethics--extensive conversation with patient's daughter and POA about current condition, plan of care and goals of care -Patient is a DNR/DNI without any limitations to treatment at this time -She remains full scope of treatment -Apparently advanced home health previously recommended to patient's daughter and family that they should consider long-term placement and possibly palliative care/hospice involvement -Patient's daughter and family are not ready to transition to palliative or hospice care at this time -Ideally they would like patient to return home with home health services upon discharge  9)Lt Heel pressure ulcer/wounds-- POA--please see photos in epic, wound care consult appreciated--- Pressure Injury 07/19/20 Heel Left Unstageable - Full thickness tissue loss in which the base of the injury is covered by slough (yellow, tan, gray, green or brown) and/or eschar (tan, brown or black) in the wound bed. (Active)  Date First Assessed/Time First Assessed: 07/19/20 1700   Location: Heel  Location Orientation: Left  Staging: Unstageable - Full thickness tissue loss in which the base of the injury is covered by slough (yellow, tan, gray, green or brown) and/or esch...    Assessments 07/19/2020  5:59 PM 07/20/2020 10:15 PM  Dressing Type Foam - Lift dressing to assess site every shift Foam - Lift dressing to assess site every shift  Dressing Changed --     No Linked orders to display     Pressure Injury 07/20/20 Heel Left Stage 3 -  Full thickness tissue loss. Subcutaneous fat  may be visible but bone, tendon or muscle are NOT exposed. (Active)  Date First Assessed/Time First Assessed: 07/20/20 1011   Location: Heel  Location Orientation: Left  Staging: Stage 3 -  Full thickness tissue loss. Subcutaneous fat may be visible but bone, tendon or muscle are NOT exposed.  Present on Admission: Yes    Assessments 07/20/2020  5:30 PM  Wound Length (cm) 1 cm  Wound Width (cm) 0.5 cm  Wound Surface Area (cm^2) 0.5 cm^2     No Linked orders to display     Disposition/Need for in-Hospital Stay- patient unable to be discharged at this time due to acute hypoxic respiratory failure secondary to CHF exacerbation with pleural effusion requiring thoracentesis  Status is: Inpatient  Remains inpatient appropriate because:acute hypoxic respiratory failure secondary to CHF exacerbation with pleural effusion requiring thoracentesis  Disposition: The patient is from: Home              Anticipated d/c is to: Home with [facility]              Anticipated d/c date is: 2 days              Patient currently is not medically stable to d/c. Barriers: Not Clinically Stable- acute hypoxic respiratory failure secondary to CHF exacerbation with pleural effusion requiring thoracentesis  Code Status : DNR  Family Communication:  Previously discussed with  daughter  [name] by phone Left voicemail for patient's daughter/POA on 07/21/2020  Consults  :  na  DVT Prophylaxis  : Apixaban  Lab Results  Component Value Date   PLT 220 07/21/2020    Inpatient Medications  Scheduled Meds: . amiodarone  100 mg Oral Daily  . apixaban  2.5 mg Oral BID  . Chlorhexidine Gluconate Cloth  6 each Topical Daily  . insulin aspart  0-15 Units Subcutaneous Q4H  . nystatin  5 mL Oral QID  . simvastatin  20 mg Oral Daily  . tamsulosin  0.4 mg Oral QPC supper   Continuous Infusions: . sodium chloride 50 mL/hr at 07/21/20 0938  . cefTRIAXone (ROCEPHIN)  IV 1 g (07/20/20 1631)   PRN  Meds:.    Anti-infectives (From admission, onward)   Start     Dose/Rate Route Frequency Ordered Stop   07/19/20 1600  cefTRIAXone (ROCEPHIN) 1 g in sodium chloride 0.9 % 100 mL IVPB        1 g 200 mL/hr over 30 Minutes Intravenous Every 24 hours 07/19/20 1536          Objective:   Vitals:   07/20/20 1427 07/20/20 2215 07/21/20 0458 07/21/20 0500  BP: (!) 107/43 (!) 125/42 (!) 104/39   Pulse: (!) 51 (!) 47 (!) 42   Resp: 18 18 18    Temp: 97.8 F (36.6 C) (!) 97.5 F (36.4 C) 97.6 F (36.4 C)   TempSrc:  Axillary Axillary   SpO2: 98% 97% 100%   Weight:    61.1 kg  Height:        Wt Readings from Last 3 Encounters:  07/21/20 61.1 kg  07/10/20 68 kg  07/09/20 60.3 kg    No intake or output data in the 24 hours ending 07/21/20 1347   Physical Exam  Gen:- Awake Alert,  In no apparent distress  HEENT:- [place].AT, No sclera icterus Nose- [place] 3L/min Neck-Supple Neck,No JVD,.  Lungs-diminished breath sounds on the left, faint bibasilar rales,  CV- S1, S2 normal, irregular  Abd-  +ve B.Sounds, Abd Soft, No tenderness,    Extremity/Skin:-  pedal pulses present  Psych---patient with cognitive and memory deficits,, episodes of disorientation and confusion  Neuro-generalized weakness, no new focal deficits, no tremors Msk:-Lt Heel pressure ulcer/wounds, necrotic eschar noted-please see photos in epic   Data Review:   Micro Results Recent Results (from the past 240 hour(s))  Urine Culture     Status: None   Collection Time: 08/01/2020 11:30 AM  Result Value Ref Range Status   Source: URINE  Final   Status: FINAL  Final   Result:   Final    Growth of mixed flora was isolated, suggesting probable contamination. No further testing will be performed. If clinically indicated, recollection using a method to minimize contamination, with prompt transfer to Urine Culture Transport Tube, is  recommended.   Resp Panel by RT PCR (RSV, Flu A&B, Covid) - Nasopharyngeal Swab     Status:  None   Collection Time: 07/30/2020  3:25 PM   Specimen: Nasopharyngeal Swab  Result Value Ref Range Status   SARS Coronavirus 2 by RT PCR NEGATIVE NEGATIVE Final    Comment: (NOTE) SARS-CoV-2 target nucleic acids are NOT DETECTED.  The SARS-CoV-2 RNA is generally detectable in upper respiratoy specimens during the acute phase of infection. The lowest concentration of SARS-CoV-2 viral copies this assay can detect is 131 copies/mL. A negative result does not preclude SARS-Cov-2 infection and should not be  used as the sole basis for treatment or other patient management decisions. A negative result may occur with  improper specimen collection/handling, submission of specimen other than nasopharyngeal swab, presence of viral mutation(s) within the areas targeted by this assay, and inadequate number of viral copies (<131 copies/mL). A negative result must be combined with clinical observations, patient history, and epidemiological information. The expected result is Negative.  Fact Sheet for Patients:  PinkCheek.be  Fact Sheet for Healthcare Providers:  GravelBags.it  This test is no t yet approved or cleared by the [place] FDA and  has been authorized for detection and/or diagnosis of SARS-CoV-2 by FDA under an Emergency Use Authorization (EUA). This EUA will remain  in effect (meaning this test can be used) for the duration of the COVID-19 declaration under Section 564(b)(1) of the Act, 21 U.S.C. section 360bbb-3(b)(1), unless the authorization is terminated or revoked sooner.     Influenza A by PCR NEGATIVE NEGATIVE Final   Influenza B by PCR NEGATIVE NEGATIVE Final    Comment: (NOTE) The Xpert Xpress SARS-CoV-2/FLU/RSV assay is intended as an aid in  the diagnosis of influenza from Nasopharyngeal swab specimens and  should not be used as a sole basis for treatment. Nasal washings and  aspirates are unacceptable for  Xpert Xpress SARS-CoV-2/FLU/RSV  testing.  Fact Sheet for Patients: PinkCheek.be  Fact Sheet for Healthcare Providers: GravelBags.it  This test is not yet approved or cleared by the [place] FDA and  has been authorized for detection and/or diagnosis of SARS-CoV-2 by  FDA under an Emergency Use Authorization (EUA). This EUA will remain  in effect (meaning this test can be used) for the duration of the  Covid-19 declaration under Section 564(b)(1) of the Act, 21  U.S.C. section 360bbb-3(b)(1), unless the authorization is  terminated or revoked.    Respiratory Syncytial Virus by PCR NEGATIVE NEGATIVE Final    Comment: (NOTE) Fact Sheet for Patients: PinkCheek.be  Fact Sheet for Healthcare Providers: GravelBags.it  This test is not yet approved or cleared by the [place] FDA and  has been authorized for detection and/or diagnosis of SARS-CoV-2 by  FDA under an Emergency Use Authorization (EUA). This EUA will remain  in effect (meaning this test can be used) for the duration of the  COVID-19 declaration under Section 564(b)(1) of the Act, 21 U.S.C.  section 360bbb-3(b)(1), unless the authorization is terminated or  revoked. Performed at [facility], [address], [place], [place] [address]     Radiology Reports DG Chest 1 View  Result Date: 07/11/2020 CLINICAL DATA:  LEFT pleural effusion post thoracentesis EXAM: CHEST  1 VIEW COMPARISON:  07/10/2020 FINDINGS: Enlargement of cardiac silhouette with pulmonary vascular congestion. Atherosclerotic calcification aorta. Small bibasilar pleural effusions and atelectasis. Improved aeration and decreased pleural effusions/atelectasis at LEFT base since prior exam. No pneumothorax. Bones demineralized. IMPRESSION: No pneumothorax following LEFT thoracentesis. Electronically Signed   By: Lavonia Dana M.D.   On:  07/11/2020 11:36   DG Chest 2 View  Result Date: 07/10/2020 CLINICAL DATA:  79 year old female with altered mental status. EXAM: CHEST - 2 VIEW COMPARISON:  Chest radiograph dated 06/30/2020. FINDINGS: There are bilateral pleural effusions, left greater right, which have increased in size compared to the prior radiograph. Bibasilar atelectasis or infiltrate, also increased since the prior radiograph. No pneumothorax. Stable cardiomegaly. Atherosclerotic calcification of the aorta. No acute osseous pathology. Surgical clips over the right chest as well as sclerotic lesion of the proximal left humeral diaphysis similar  to prior radiograph. IMPRESSION: Bilateral pleural effusions, left greater right, and bibasilar atelectasis or infiltrate, increased since the prior radiograph. Electronically Signed   By: Anner Crete M.D.   On: 07/10/2020 15:51   DG Chest 2 View  Result Date: 06/30/2020 CLINICAL DATA:  Foul-smelling urine and abnormal EKG EXAM: CHEST - 2 VIEW COMPARISON:  02/13/2020 FINDINGS: Cardiac shadow is enlarged. Aortic calcifications are noted. Lungs are well aerated bilaterally. Chronic blunting of the right costophrenic angle is noted. No new focal infiltrate is seen. Mild central vascular congestion is noted. IMPRESSION: Changes of mild CHF without edema. Chronic changes in the right base. Electronically Signed   By: Inez Catalina M.D.   On: 06/30/2020 15:32   CT HEAD WO CONTRAST  Result Date: 07/01/2020 CLINICAL DATA:  Altered mental status on top of baseline dementia. Chronic anticoagulation. EXAM: CT HEAD WITHOUT CONTRAST TECHNIQUE: Contiguous axial images were obtained from the base of the skull through the vertex without intravenous contrast. COMPARISON:  None. FINDINGS: Brain: Examination is technically limited by motion artifact. There is evidence of diffuse cerebral atrophy. Ventricular dilatation is likely due to central atrophy. Low-attenuation changes in the deep white matter  likely represent small vessel ischemic change. No obvious mass effect or midline shift. No acute intracranial hemorrhage is demonstrated. No abnormal extra-axial fluid collections. Vascular: No acute vascular abnormality is identified. Skull: Visualized calvarium appears intact. Sinuses/Orbits: Visualized paranasal sinuses and mastoid air cells are clear. Other: None. IMPRESSION: 1. Technically limited study due to motion artifact. 2. No acute intracranial abnormalities are demonstrated. 3. Chronic atrophy and small vessel ischemic changes. Electronically Signed   By: Lucienne Capers M.D.   On: 07/01/2020 00:43   CT Angio Chest PE W and/or Wo Contrast  Result Date: 08/10/2020 CLINICAL DATA:  Dementia patient with shortness of breath and abdominal pain. EXAM: CT ANGIOGRAPHY CHEST CT ABDOMEN AND PELVIS WITH CONTRAST TECHNIQUE: Multidetector CT imaging of the chest was performed using the standard protocol during bolus administration of intravenous contrast. Multiplanar CT image reconstructions and MIPs were obtained to evaluate the vascular anatomy. Multidetector CT imaging of the abdomen and pelvis was performed using the standard protocol during bolus administration of intravenous contrast. CONTRAST:  76mL OMNIPAQUE IOHEXOL 350 MG/ML SOLN COMPARISON:  Same day chest radiograph, CT chest dated 02/06/2020, CT abdomen pelvis dated 02/04/2019. FINDINGS: CTA CHEST FINDINGS Cardiovascular: Satisfactory opacification of the pulmonary arteries to the segmental level. No evidence of pulmonary embolism. Vascular calcifications are seen in the coronary arteries and aortic arch. There heart is enlarged and there is a moderate to large pericardial effusion. Mediastinum/Nodes: No enlarged mediastinal, hilar, or axillary lymph nodes. Aspirated debris is seen in the trachea (series 8 images 30 3-38). Thyroid gland and esophagus demonstrate no significant findings. Lungs/Pleura: There is a large left and a small right pleural  effusion with associated atelectasis. There are mild scattered bilateral ground-glass opacities. There is no pneumothorax. Musculoskeletal: Degenerative changes are seen in the spine. Review of the MIP images confirms the above findings. CT ABDOMEN and PELVIS FINDINGS Hepatobiliary: No focal liver abnormality is seen. The patient is status post cholecystectomy. There is moderate intra and extrahepatic biliary ductal dilatation with the common bile duct measuring up to 17 mm in size. Pancreas: Unremarkable. No pancreatic ductal dilatation or surrounding inflammatory changes. Spleen: Normal in size without focal abnormality. Adrenals/Urinary Tract: Adrenal glands are unremarkable. There is mild-to-moderate right hydronephrosis with a nondilated ureter which may reflect ureteropelvic junction obstruction. There is no hydronephrosis on the  left. No renal calculi or focal renal lesion is identified on either side. Bladder is unremarkable. Stomach/Bowel: Stomach is within normal limits. No pericecal inflammatory changes are noted to suggest acute appendicitis. There is colonic diverticulosis without evidence of diverticulitis. No evidence of bowel wall thickening, distention, or inflammatory changes. Vascular/Lymphatic: Aortic atherosclerosis. No enlarged abdominal or pelvic lymph nodes. Reproductive: Status post hysterectomy. No adnexal masses. Other: No abdominal wall hernia or abnormality. No abdominopelvic ascites. Anasarca is noted. Musculoskeletal: Degenerative changes are seen in the spine. There is 6 mm anterolisthesis of L4 on L5, not significantly changed since 12/05/2018. Review of the MIP images confirms the above findings. IMPRESSION: 1. No evidence of pulmonary embolism. 2. Large left and small right pleural effusions with associated atelectasis. 3. Mild scattered bilateral ground-glass opacities likely represent atelectasis and/or pneumonia. 4. Cardiomegaly with a moderate to large pericardial effusion. 5.  Moderate intra and extrahepatic biliary ductal dilatation with the common bile duct measuring up to 17 mm in size. This may reflect post cholecystectomy state and patient age. Correlation with liver function tests is recommended. 6. Mild-to-moderate right hydronephrosis with a nondilated ureter which may reflect ureteropelvic junction obstruction. Aortic Atherosclerosis (ICD10-I70.0). Electronically Signed   By: Zerita Boers M.D.   On: 08/04/2020 18:08   CT ABDOMEN PELVIS W CONTRAST  Result Date: 07/26/2020 CLINICAL DATA:  Dementia patient with shortness of breath and abdominal pain. EXAM: CT ANGIOGRAPHY CHEST CT ABDOMEN AND PELVIS WITH CONTRAST TECHNIQUE: Multidetector CT imaging of the chest was performed using the standard protocol during bolus administration of intravenous contrast. Multiplanar CT image reconstructions and MIPs were obtained to evaluate the vascular anatomy. Multidetector CT imaging of the abdomen and pelvis was performed using the standard protocol during bolus administration of intravenous contrast. CONTRAST:  35mL OMNIPAQUE IOHEXOL 350 MG/ML SOLN COMPARISON:  Same day chest radiograph, CT chest dated 02/06/2020, CT abdomen pelvis dated 02/04/2019. FINDINGS: CTA CHEST FINDINGS Cardiovascular: Satisfactory opacification of the pulmonary arteries to the segmental level. No evidence of pulmonary embolism. Vascular calcifications are seen in the coronary arteries and aortic arch. There heart is enlarged and there is a moderate to large pericardial effusion. Mediastinum/Nodes: No enlarged mediastinal, hilar, or axillary lymph nodes. Aspirated debris is seen in the trachea (series 8 images 30 3-38). Thyroid gland and esophagus demonstrate no significant findings. Lungs/Pleura: There is a large left and a small right pleural effusion with associated atelectasis. There are mild scattered bilateral ground-glass opacities. There is no pneumothorax. Musculoskeletal: Degenerative changes are seen in  the spine. Review of the MIP images confirms the above findings. CT ABDOMEN and PELVIS FINDINGS Hepatobiliary: No focal liver abnormality is seen. The patient is status post cholecystectomy. There is moderate intra and extrahepatic biliary ductal dilatation with the common bile duct measuring up to 17 mm in size. Pancreas: Unremarkable. No pancreatic ductal dilatation or surrounding inflammatory changes. Spleen: Normal in size without focal abnormality. Adrenals/Urinary Tract: Adrenal glands are unremarkable. There is mild-to-moderate right hydronephrosis with a nondilated ureter which may reflect ureteropelvic junction obstruction. There is no hydronephrosis on the left. No renal calculi or focal renal lesion is identified on either side. Bladder is unremarkable. Stomach/Bowel: Stomach is within normal limits. No pericecal inflammatory changes are noted to suggest acute appendicitis. There is colonic diverticulosis without evidence of diverticulitis. No evidence of bowel wall thickening, distention, or inflammatory changes. Vascular/Lymphatic: Aortic atherosclerosis. No enlarged abdominal or pelvic lymph nodes. Reproductive: Status post hysterectomy. No adnexal masses. Other: No abdominal wall hernia or abnormality. No  abdominopelvic ascites. Anasarca is noted. Musculoskeletal: Degenerative changes are seen in the spine. There is 6 mm anterolisthesis of L4 on L5, not significantly changed since 12/05/2018. Review of the MIP images confirms the above findings. IMPRESSION: 1. No evidence of pulmonary embolism. 2. Large left and small right pleural effusions with associated atelectasis. 3. Mild scattered bilateral ground-glass opacities likely represent atelectasis and/or pneumonia. 4. Cardiomegaly with a moderate to large pericardial effusion. 5. Moderate intra and extrahepatic biliary ductal dilatation with the common bile duct measuring up to 17 mm in size. This may reflect post cholecystectomy state and patient  age. Correlation with liver function tests is recommended. 6. Mild-to-moderate right hydronephrosis with a nondilated ureter which may reflect ureteropelvic junction obstruction. Aortic Atherosclerosis (ICD10-I70.0). Electronically Signed   By: Zerita Boers M.D.   On: 07/17/2020 18:08   DG Chest Port 1 View  Result Date: 08/12/2020 CLINICAL DATA:  Shortness of breath. EXAM: PORTABLE CHEST 1 VIEW COMPARISON:  Radiograph 07/11/2020.  CT 02/06/2020 FINDINGS: Stable cardiomegaly. Unchanged mediastinal contours. Bilateral pleural effusions, not significantly changed on the right and increased on the left. Increased left basilar airspace disease typically atelectasis. Vascular congestion is similar. There is no pneumothorax. Stable osseous structures. Surgical clips in the right axilla. IMPRESSION: 1. Increased left pleural effusion with progressive left basilar airspace disease over the past week, typically atelectasis. 2. No significant change in right pleural effusion. 3. Stable cardiomegaly and vascular congestion. Electronically Signed   By: Keith Rake M.D.   On: 07/19/2020 16:03   ECHOCARDIOGRAM COMPLETE  Result Date: 07/19/2020    ECHOCARDIOGRAM REPORT   Patient Name:   MELITZA METHENY Formica Date of Exam: 07/19/2020 Medical Rec #:  [id number]       Height:       65.0 in Accession #:    [id number]      Weight:       149.9 lb Date of Birth:  [birth year]       BSA:          1.750 m Patient Age:    81 years        BP:           125/75 mmHg Patient Gender: F               HR:           107 bpm. Exam Location:  [facility] Procedure: 2D Echo Indications:    Pericardial effusion 423.9 / I31.3  History:        Patient has prior history of Echocardiogram examinations, most                 recent 12/17/2019. CHF, Pericardial Disease; Risk                 Factors:Hypertension, Dyslipidemia, Non-Smoker and Diabetes.                 Dementia, Bradycardia.  Sonographer:    Leavy Cella RDCS (AE) Referring Phys: [id number]  OLADAPO ADEFESO IMPRESSIONS  1. Left ventricular ejection fraction, by estimation, is 50 to 55%. The left ventricle has low normal function. The left ventricle demonstrates regional wall motion abnormalities (see scoring diagram/findings for description). Left ventricular diastolic  parameters are indeterminate.  2. Right ventricular systolic function is low normal. The right ventricular size is normal.  3. Left atrial size was severely dilated.  4. A small pericardial effusion is present. The pericardial effusion is circumferential. There is no evidence of cardiac tamponade.  5. The mitral valve is normal in structure. Trivial mitral valve regurgitation. No evidence of mitral stenosis.  6. The aortic valve is tricuspid. Aortic valve regurgitation is not visualized. No aortic stenosis is present. FINDINGS  Left Ventricle: Left ventricular ejection fraction, by estimation, is 50 to 55%. The left ventricle has low normal function. The left ventricle demonstrates regional wall motion abnormalities. The left ventricular internal cavity size was normal in size. There is no left ventricular hypertrophy. Left ventricular diastolic parameters are indeterminate. Right Ventricle: The right ventricular size is normal. No increase in right ventricular wall thickness. Right ventricular systolic function is low normal. Left Atrium: Left atrial size was severely dilated. Right Atrium: Right atrial size was normal in size. Pericardium: A small pericardial effusion is present. The pericardial effusion is circumferential. There is no evidence of cardiac tamponade. Mitral Valve: The mitral valve is normal in structure. Trivial mitral valve regurgitation. No evidence of mitral valve stenosis. Tricuspid Valve: The tricuspid valve is normal in structure. Tricuspid valve regurgitation is mild . No evidence of tricuspid stenosis. Aortic Valve: The aortic valve is tricuspid. Aortic valve regurgitation is not visualized. No aortic stenosis  is present. Aortic valve mean gradient measures 4.1 mmHg. Aortic valve peak gradient measures 6.6 mmHg. Aortic valve area, by VTI measures 1.43 cm. Pulmonic Valve: The pulmonic valve was not well visualized. Pulmonic valve regurgitation is not visualized. No evidence of pulmonic stenosis. Aorta: The aortic root is normal in size and structure. Pulmonary Artery: 34.  Venous: The inferior vena cava was not well visualized. IAS/Shunts: The interatrial septum was not well visualized.  LEFT VENTRICLE PLAX 2D LVIDd:         3.91 cm  Diastology LVIDs:         3.08 cm  LV e' medial:    5.22 cm/s LV PW:         0.90 cm  LV E/e' medial:  17.4 LV IVS:        0.88 cm  LV e' lateral:   5.15 cm/s LVOT diam:     1.90 cm  LV E/e' lateral: 17.6 LV SV:         45 LV SV Index:   26 LVOT Area:     2.84 cm  RIGHT VENTRICLE RV S prime:     6.18 cm/s TAPSE (M-mode): 1.6 cm LEFT ATRIUM             Index       RIGHT ATRIUM           Index LA diam:        4.40 cm 2.51 cm/m  RA Area:     15.80 cm LA Vol (A2C):   61.0 ml 34.86 ml/m RA Volume:   39.80 ml  22.74 ml/m LA Vol (A4C):   72.0 ml 41.14 ml/m LA Biplane Vol: 68.8 ml 39.31 ml/m  AORTIC VALVE AV Area (Vmax):    1.44 cm AV Area (Vmean):   1.40 cm AV Area (VTI):     1.43 cm AV Vmax:           128.76 cm/s AV Vmean:          95.477 cm/s AV VTI:            0.313 m AV Peak Grad:      6.6 mmHg AV Mean Grad:      4.1 mmHg LVOT Vmax:         65.55 cm/s LVOT Vmean:  47.156 cm/s LVOT VTI:          0.158 m LVOT/AV VTI ratio: 0.50  AORTA Ao Root diam: 2.60 cm MITRAL VALVE               TRICUSPID VALVE MV Area (PHT): 2.90 cm    TR Peak grad:   33.6 mmHg MV Decel Time: 262 msec    TR Vmax:        290.00 cm/s MV E velocity: 90.80 cm/s MV A velocity: 66.00 cm/s  SHUNTS MV E/A ratio:  1.38        Systemic VTI:  0.16 m                            Systemic Diam: 1.90 cm Carlyle Dolly MD Electronically signed by Carlyle Dolly MD Signature Date/Time: 07/19/2020/12:39:52 PM    Final    US  THORACENTESIS ASP PLEURAL SPACE W/IMG GUIDE  Result Date: 07/11/2020 INDICATION: LEFT pleural effusion EXAM: ULTRASOUND GUIDED DIAGNOSTIC AND THERAPEUTIC THORACENTESIS MEDICATIONS: None COMPLICATIONS: None immediate PROCEDURE: An ultrasound guided thoracentesis was thoroughly discussed with the patient and questions answered. The benefits, risks, alternatives and complications were also discussed. The patient understands and wishes to proceed with the procedure. Written consent was obtained. Ultrasound was performed to localize and mark an adequate pocket of fluid in the LEFT chest. The area was then prepped and draped in the normal sterile fashion. 1% Lidocaine was used for local anesthesia. Under ultrasound guidance a 8 French thoracentesis catheter was introduced. Thoracentesis was performed. The catheter was removed and a dressing applied. FINDINGS: A total of approximately 630 mL of yellow LEFT pleural fluid was removed. Samples were sent to the laboratory as requested by the clinical team. IMPRESSION: Successful ultrasound guided LEFT thoracentesis yielding 630 mL of pleural fluid. Electronically Signed   By: Lavonia Dana M.D.   On: 07/11/2020 11:35     CBC Recent Labs  Lab 07/28/2020 1500 07/19/20 0351 07/21/20 0543  WBC 9.9 9.5 11.5*  HGB 10.4* 10.0* 8.6*  HCT 35.1* 35.7* 31.0*  PLT 315 330 220  MCV 87.5 90.6 93.4  MCH 25.9* 25.4* 25.9*  MCHC 29.6* 28.0* 27.7*  RDW 16.2* 16.3* 16.4*  LYMPHSABS 0.8  --   --   MONOABS 0.8  --   --   EOSABS 0.1  --   --   BASOSABS 0.0  --   --     Chemistries  Recent Labs  Lab 07/17/2020 1500 07/19/20 0351 07/21/20 0543  NA 142 145 144  K 4.6 4.8 4.8  CL 107 108 107  CO2 30 29 29   GLUCOSE 183* 167* 143*  BUN 22 18 27*  CREATININE 0.80 0.70 1.34*  CALCIUM 8.8* 8.9 8.6*  MG  --  2.0  --   AST 22 20  --   ALT 35 33  --   ALKPHOS 106 95  --   BILITOT 0.9 0.7  --     ------------------------------------------------------------------------------------------------------------------ No results for input(s): CHOL, HDL, LDLCALC, TRIG, CHOLHDL, LDLDIRECT in the last 72 hours.  Lab Results  Component Value Date   HGBA1C 7.7 (H) 06/30/2020   No results for input(s): TSH, T4TOTAL, T3FREE, THYROIDAB in the last 72 hours.  Invalid input(s): FREET3 ------------------------------------------------------------------------------------------------------------------ No results for input(s): VITAMINB12, FOLATE, FERRITIN, TIBC, IRON, RETICCTPCT in the last 72 hours.  Coagulation profile Recent Labs  Lab 07/19/20 0351  INR 1.3*    No results for input(s):  DDIMER in the last 72 hours.  Cardiac Enzymes No results for input(s): CKMB, TROPONINI, MYOGLOBIN in the last 168 hours.  Invalid input(s): CK ------------------------------------------------------------------------------------------------------------------    Component Value Date/Time   BNP 375.0 (H) 08/03/2020 1500   Roxan Hockey M.D on 07/21/2020 at 1:47 PM  Go to [email or web address] - for contact info  [facility] - Office  [phone]

## 2020-07-21 NOTE — Progress Notes (Signed)
Gave patient prn xanax 0.5mg  po due to patient being anxious at 1716  Telemetry called at 1750 stating patients heart rate dropped to 25. This nurse went in to assess patient. Patient was lethargic rechecked heart rate. Reading was 35. MD paged. Vital signs obtained blood pressure 75/47 heart rate was 35 oxygen saturation 91 on 3 liters patient mouth breathing with shallow breaths.  12 lead EKG ordered by MD at 1802. Respiratory obtained EKG. MD placed order for 24ml bolus of Normal Saline at 1802. Bolus given. Vital signs rechecked at 1806 B/P 84/41 pulse 37. Patient still remains lethargic responding to pain stimulus. MD made aware.  MD placed order for atropine 0.4mg  IV one time order. This nurse called AC(Tim Goins) to be present when administered  to patient. administered atropine 0.4mg  IV one time order at 1821. Rechecked vital signs at 1825 88/48, pulse was 52. Patients pulse then began to brady down to 40's after atropine dose. MD called this nurse to give another dose of atropine 0.4mg  IV one time order. Order was placed by MD.  [facility] placed a 22 gauge IV to the right hand due to current IV infiltrated This nurse gave atropine 0.4mg  IV one time order at [facility] with [place] Octavia Bruckner Goins) present in room. Rechecked vital signs at 1852 reading was 96/45 pulse 50. Patient would respond to pain stimulus. MD updated family on patients status. Patients husband arrived to patients room around 1900. MD spoke to patients husband via telephone. Patient then began to talk some  to husband stating "help me" patient didn't open her eyes when she spoke at that time. Report given to oncoming nurse ([place]) who was in the room at shift change.

## 2020-07-21 NOTE — Progress Notes (Signed)
Pt'd daughter called, had 49min conversation with her. Daughter says evening doctor was difficult to understand, "jumped around a lot" in the conversation, and requested update from RN. Relayed that pt is stable now, O2 titrated down r/t MD interpretation of ABG results, pt closer to baseline, calling out "help me", though HR still in 40s. Daughter verbalized that pt was doing well at the [facility] as far as she knew and started doing poorly after they brought her home. When asked about medications daughter stated she did not know what pt had been on at [facility] and hoped they had sent pt home/written pt a script for everything she had been on there. Told daughter she would be notified of any changes in pt condition. Pt currently stable and comfortable, husband at bedside. Will continue to monitor.

## 2020-07-21 NOTE — Progress Notes (Addendum)
  Temp:  [97.5 F (36.4 C)-98.1 F (36.7 C)] 98.1 F (36.7 C) (11/07 1345) Pulse Rate:  [35-90] 48 (11/07 1932) Resp:  [17-20] 20 (11/07 1932) BP: (75-125)/(37-77) 97/48 (11/07 1932) SpO2:  [91 %-100 %] 98 % (11/07 1932) Weight:  [61.1 kg] 61.1 kg (11/07 0500)   Pt becoming a bit more responsive BP and HR improved, oxygen sats improving -- Labs, CXR and ABG pending -d/w day time RN Zenaida Deed again Also Discussed with on-coming night RN Fredrich Birks  -awaiting studies as above  Roxan Hockey, MD

## 2020-07-22 ENCOUNTER — Inpatient Hospital Stay (HOSPITAL_COMMUNITY): Payer: Medicare Other

## 2020-07-22 ENCOUNTER — Encounter (INDEPENDENT_AMBULATORY_CARE_PROVIDER_SITE_OTHER): Payer: Self-pay

## 2020-07-22 ENCOUNTER — Ambulatory Visit (INDEPENDENT_AMBULATORY_CARE_PROVIDER_SITE_OTHER): Payer: BLUE CROSS/BLUE SHIELD | Admitting: Nurse Practitioner

## 2020-07-22 ENCOUNTER — Encounter (HOSPITAL_COMMUNITY): Payer: Self-pay | Admitting: Internal Medicine

## 2020-07-22 ENCOUNTER — Telehealth (INDEPENDENT_AMBULATORY_CARE_PROVIDER_SITE_OTHER): Payer: Self-pay

## 2020-07-22 DIAGNOSIS — J90 Pleural effusion, not elsewhere classified: Secondary | ICD-10-CM

## 2020-07-22 DIAGNOSIS — I313 Pericardial effusion (noninflammatory): Secondary | ICD-10-CM

## 2020-07-22 DIAGNOSIS — J9601 Acute respiratory failure with hypoxia: Secondary | ICD-10-CM | POA: Diagnosis not present

## 2020-07-22 DIAGNOSIS — I495 Sick sinus syndrome: Secondary | ICD-10-CM

## 2020-07-22 DIAGNOSIS — Z515 Encounter for palliative care: Secondary | ICD-10-CM

## 2020-07-22 DIAGNOSIS — Z66 Do not resuscitate: Secondary | ICD-10-CM

## 2020-07-22 DIAGNOSIS — F0280 Dementia in other diseases classified elsewhere without behavioral disturbance: Secondary | ICD-10-CM | POA: Diagnosis not present

## 2020-07-22 DIAGNOSIS — Z7189 Other specified counseling: Secondary | ICD-10-CM

## 2020-07-22 DIAGNOSIS — I4892 Unspecified atrial flutter: Secondary | ICD-10-CM

## 2020-07-22 DIAGNOSIS — N179 Acute kidney failure, unspecified: Secondary | ICD-10-CM

## 2020-07-22 LAB — CBC
HCT: 32.4 % — ABNORMAL LOW (ref 36.0–46.0)
Hemoglobin: 8.7 g/dL — ABNORMAL LOW (ref 12.0–15.0)
MCH: 25.7 pg — ABNORMAL LOW (ref 26.0–34.0)
MCHC: 26.9 g/dL — ABNORMAL LOW (ref 30.0–36.0)
MCV: 95.9 fL (ref 80.0–100.0)
Platelets: 176 10*3/uL (ref 150–400)
RBC: 3.38 MIL/uL — ABNORMAL LOW (ref 3.87–5.11)
RDW: 16.5 % — ABNORMAL HIGH (ref 11.5–15.5)
WBC: 8.9 10*3/uL (ref 4.0–10.5)
nRBC: 0 % (ref 0.0–0.2)

## 2020-07-22 LAB — ECHOCARDIOGRAM LIMITED
Area-P 1/2: 2.74 cm2
Height: 65 in
S' Lateral: 2.34 cm
Weight: 2151.69 oz

## 2020-07-22 LAB — BASIC METABOLIC PANEL
Anion gap: 8 (ref 5–15)
Anion gap: 9 (ref 5–15)
BUN: 30 mg/dL — ABNORMAL HIGH (ref 8–23)
BUN: 34 mg/dL — ABNORMAL HIGH (ref 8–23)
CO2: 25 mmol/L (ref 22–32)
CO2: 28 mmol/L (ref 22–32)
Calcium: 8.2 mg/dL — ABNORMAL LOW (ref 8.9–10.3)
Calcium: 8.5 mg/dL — ABNORMAL LOW (ref 8.9–10.3)
Chloride: 110 mmol/L (ref 98–111)
Chloride: 107 mmol/L (ref 98–111)
Creatinine, Ser: 1.69 mg/dL — ABNORMAL HIGH (ref 0.44–1.00)
Creatinine, Ser: 1.8 mg/dL — ABNORMAL HIGH (ref 0.44–1.00)
GFR, Estimated: 30 mL/min — ABNORMAL LOW (ref >60)
GFR, Estimated: 28 mL/min — ABNORMAL LOW (ref >60)
Glucose, Bld: 137 mg/dL — ABNORMAL HIGH (ref 70–99)
Glucose, Bld: 155 mg/dL — ABNORMAL HIGH (ref 70–99)
Potassium: 5.1 mmol/L (ref 3.5–5.1)
Potassium: 5 mmol/L (ref 3.5–5.1)
Sodium: 143 mmol/L (ref 135–145)
Sodium: 144 mmol/L (ref 135–145)

## 2020-07-22 LAB — MAGNESIUM: Magnesium: 2.2 mg/dL (ref 1.7–2.4)

## 2020-07-22 LAB — GLUCOSE, CAPILLARY
Glucose-Capillary: 114 mg/dL — ABNORMAL HIGH (ref 70–99)
Glucose-Capillary: 114 mg/dL — ABNORMAL HIGH (ref 70–99)
Glucose-Capillary: 114 mg/dL — ABNORMAL HIGH (ref 70–99)
Glucose-Capillary: 127 mg/dL — ABNORMAL HIGH (ref 70–99)
Glucose-Capillary: 137 mg/dL — ABNORMAL HIGH (ref 70–99)
Glucose-Capillary: 99 mg/dL (ref 70–99)

## 2020-07-22 LAB — TROPONIN I (HIGH SENSITIVITY)
Troponin I (High Sensitivity): 11 ng/L (ref <18)
Troponin I (High Sensitivity): 11 ng/L (ref <18)

## 2020-07-22 LAB — TSH: TSH: 2.712 u[IU]/mL (ref 0.350–4.500)

## 2020-07-22 MED ORDER — DEXTROSE-NACL 5-0.45 % IV SOLN: INTRAVENOUS | Status: DC

## 2020-07-22 NOTE — Sedation Documentation (Signed)
Telephone consent verified for procedure today via telephone with daughter [name] prior to procedure. Patient tolerated left sided thoracentesis procedure well today and 1,100 mL fluid removed. Post chest xray completed and patient returned to inpatient bed on the 3rd floor.

## 2020-07-22 NOTE — Telephone Encounter (Signed)
Patient is currently admitted to the hospital. Please see other telephone messages for further follow up and plan of care.

## 2020-07-22 NOTE — Progress Notes (Signed)
4:53am RN called that pt's rectal temp was 92.20F, Bair Hugger was ordered.

## 2020-07-22 NOTE — Progress Notes (Signed)
Patient alert stating help me. When asking patient if she needs anything she states help me. This nurse asked patient to state name patient stated her name . This nurses asked patient to lift arms and legs patient lifted arms and legs. Patient opens eyes spontaneously pupils have sluggish reaction to light. Bair hugger still applied rectal temperature was 95.0 when assessing patient this AM. No family at bedside at this time.  Attempted to call patients daughter to give her an update daughter didn't answer phone this nurse left a voicemail with call back number. No further complaints or distress noted at this time will continue to monitor patient throughout shift.

## 2020-07-22 NOTE — Telephone Encounter (Signed)
Thank you for the updates.  I spoke with the patient's attending physician in the hospital this morning.  He tells me he has been referring patient to palliative care and probably hospice.  I will hold off on ordering referral to hospice as I believe this is being taken care of currently while patient is in the hospital.  I will continue to monitor the situations.  I have called Joelene Millin to discuss the situation further, unfortunately she was not available at the time I called but I did leave a message asking her to give me a call back.  Please let me know if any other concerns arise.  Thank you.

## 2020-07-22 NOTE — Progress Notes (Signed)
PT Cancellation Note  Patient Details Name: Amy Hoffman MRN: [id number] DOB: Oct 05, 1940   Cancelled Treatment:    Reason Eval/Treat Not Completed: Medical issues which prohibited therapy.     11:06 AM, 07/22/20 Lonell Grandchild, MPT Physical Therapist with [facility] [phone] office [phone] mobile phone

## 2020-07-22 NOTE — Progress Notes (Signed)
0500- MD paged and updated as to pt's condition. MD said he had received report from previous MD and was aware, he saw labs were ordered and stated there was not much more we could be doing at this time. Pt currently sleeping comfortably on heating blanket, husband asleep at bedside. Continuing to monitor.

## 2020-07-22 NOTE — Progress Notes (Signed)
*  PRELIMINARY RESULTS* Echocardiogram 2D Echocardiogram LIMITED has been performed.  Leavy Cella 07/22/2020, 10:52 AM

## 2020-07-22 NOTE — Procedures (Signed)
INDICATION: Left pleural effusion  EXAM: ULTRASOUND GUIDED LEFT THORACENTESIS  GRIP-IR: Category: Fluids  Subcategory: Thoracentesis Follow-Up: None  MEDICATIONS: None.   COMPLICATIONS: None immediate.  PROCEDURE: The patient has dementia and is not able to consent for herself. I spoke to the patient's daughter by telephone.  We discussed the reason for today's thoracentesis as well as the risks (including hemorrhage, infection, and pneumothorax), benefits, and alternatives to ultrasound-guided thoracentesis.  We discussed the high likelihood of success of the procedure.  The patient's daughter understood and elected for the patient undergo the procedure.   Standard time-out was employed.  Following sterile skin prep and local anesthetic administration consisting of 1% lidocaine, and following ultrasound localization, a Yueh catheter was advanced without difficulty into the left pleural fluid.  Amber fluid was returned, and the needle removed with the catheter left in place.  A total of 1100 cc of amber fluid was removed.  The catheter was subsequently removed in the skin cleansed and bandaged.  No immediate complication was observed.   Following the procedure, a chest radiograph demonstrated no significant pneumothorax and marked reduction of the left pleural effusion.    FINDINGS: A total of approximately 1100 cc of amber fluid was removed.   IMPRESSION: Successful ultrasound guided left thoracentesis yielding 1100 cc of pleural fluid.

## 2020-07-22 NOTE — Consult Note (Signed)
Consultation Note Date: 07/22/2020   Patient Name: Amy Hoffman  DOB: 1941/06/04  MRN: [id number]  Age / Sex: 79 y.o., female  PCP: [name], NP Referring Physician: Roxan Hockey, MD  Reason for Consultation: Establishing goals of care  HPI/Patient Profile: 79 y.o. female  with past medical history of dementia, a. fib, diastolic CHF, abnormal uterine bleeding, allergic rhinitis, anxiety/depression, R breast cancer, DM2 HTN/HLD, obesity, GERD, osteoarthritis.  Seen by multiple PMT last on 10/28  admitted on 07/27/2020 with acute on chronic HF, acute respiratory failure.   Clinical Assessment and Goals of Care: Amy Hoffman is lying quietly in bed.  At times she appears to exhibit agonal breathing.  She does not make eye contact, is unable to tell me her name.  There is no family at bedside at this time.  She will frequently speak, and I believe she is stating, "help me".  Detail conference with attending as he is speaking with daughter, Amy Hoffman.  We talked at length about Amy Hoffman's heart issues, cardiology consult, recommendations for thoracentesis.  Risks and benefits of thoracentesis discussed and Amy Hoffman elects to proceed.  Also discussed at length are Amy Hoffman's low heart rate in 30s and 40s treated with atropine, low body temperature 92 treated with warming blanket, her respiratory status including oxygen needs.  Amy Hoffman shares that her mother has been experiencing issues with her heart, "going on for 1.5 years".  Cardiologist recommendations were discussed with Amy Hoffman in detail.  She states she understands the recommendations for not restarting cardiac meds.  Amy Hoffman is tearful at times, sharing her feelings of responsibility for her mother's care and outcomes.   Amy Hoffman shares that she would like for Amy Hoffman to be able to be at home for Christmas.   Palliative team conversation with daughter Amy Hoffman  when attending has completed his discussion.  The seriousness of Amy Hoffman's health concerns are shared.  We talked about thoracentesis today and time for outcomes.  Conference with attending and bedside nursing staff related to patient condition, needs, goals of care. PMT to continue to follow.   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     SUMMARY OF RECOMMENDATIONS   Daughter Amy Hoffman is agreeable to thoracentesis today. At this point time for outcomes. Continue to treat the treatable but no CPR or intubation.   Code Status/Advance Care Planning:  DNR  Symptom Management:   Per hospitalist, no additional needs at this time.   Palliative Prophylaxis:   Frequent Pain Assessment  Additional Recommendations (Limitations, Scope, Preferences):  continue full scope treatment, no CPR or intubation   Psycho-social/Spiritual:   Desire for further Chaplaincy support:no  Additional Recommendations: Caregiving  Support/Resources and Education on Hospice  Prognosis:   Unable to determine, based on outcomes. 3-6 months or less would not be surprising.   Discharge Planning: to be determined, based on outcomes.      Primary Diagnoses: Present on Admission: . Bilateral pleural effusion .  Essential hypertension . Hyperlipidemia associated with type 2 diabetes mellitus ([place])   I have reviewed the medical record, interviewed the patient and family, and examined the patient. The following aspects are pertinent.  Past Medical History:  Diagnosis Date  . Allergic rhinitis due to pollen   . Anxiety   . Asthma   . Atrial fibrillation and flutter ([place])   . Breast cancer ([place])    Right  . Dementia ([place])   . Depression   . Essential hypertension   . History of depression   . Hyperlipidemia   . Osteoarthritis   . Reflux esophagitis   . Sinus node dysfunction ([facility])   . Trigger finger, acquired   . Type 2 diabetes  mellitus ([place])    Social History   Socioeconomic History  . Marital status: Married    Spouse name: Not on file  . Number of children: Not on file  . Years of education: Not on file  . Highest education level: Not on file  Occupational History  . Occupation: retired   Tobacco Use  . Smoking status: Never Smoker  . Smokeless tobacco: Never Used  Vaping Use  . Vaping Use: Never used  Substance and Sexual Activity  . Alcohol use: No  . Drug use: No  . Sexual activity: Not Currently  Other Topics Concern  . Not on file  Social History Narrative   [facility] resident of [facility]    Social Determinants of Health   Financial Resource Strain:   . Difficulty of Paying Living Expenses: Not on file  Food Insecurity:   . Worried About Charity fundraiser in the Last Year: Not on file  . Ran Out of Food in the Last Year: Not on file  Transportation Needs:   . Lack of Transportation (Medical): Not on file  . Lack of Transportation (Non-Medical): Not on file  Physical Activity:   . Days of Exercise per Week: Not on file  . Minutes of Exercise per Session: Not on file  Stress:   . Feeling of Stress : Not on file  Social Connections:   . Frequency of Communication with Friends and Family: Not on file  . Frequency of Social Gatherings with Friends and Family: Not on file  . Attends Religious Services: Not on file  . Active Member of Clubs or Organizations: Not on file  . Attends Archivist Meetings: Not on file  . Marital Status: Not on file   Family History  Family history unknown: Yes   Scheduled Meds: . apixaban  2.5 mg Oral BID  . Chlorhexidine Gluconate Cloth  6 each Topical Daily  . insulin aspart  0-15 Units Subcutaneous Q4H  . nystatin  5 mL Oral QID  . simvastatin  20 mg Oral Daily  . tamsulosin  0.4 mg Oral QPC supper   Continuous Infusions: PRN Meds:.acetaminophen, Resource ThickenUp Clear Medications Prior to Admission:  Prior to Admission medications     Medication Sig Start Date End Date Taking? Authorizing Provider  acetaminophen (TYLENOL) 325 MG tablet Take 2 tablets (650 mg total) by mouth every 6 (six) hours as needed for mild pain (or Fever >/= 101). 02/14/20  Yes Emokpae, Courage, MD  amiodarone (PACERONE) 200 MG tablet Take 1 tablet (200 mg total) by mouth daily. 06/27/20  Yes Gerlene Fee, NP  amLODipine (NORVASC) 5 MG tablet Take 1 tablet (5 mg total) by mouth daily. 06/27/20  Yes Gerlene Fee, NP  anastrozole (ARIMIDEX) 1  MG tablet Take 1 tablet (1 mg total) by mouth daily. 06/27/20  Yes Gerlene Fee, NP  apixaban (ELIQUIS) 2.5 MG TABS tablet Take 1 tablet (2.5 mg total) by mouth 2 (two) times daily. 07/04/20  Yes Nita Sells, MD  busPIRone (BUSPAR) 5 MG tablet Take 1 tablet (5 mg total) by mouth 2 (two) times daily. 06/27/20  Yes Gerlene Fee, NP  feeding supplement, GLUCERNA SHAKE, (GLUCERNA SHAKE) LIQD Take 237 mLs by mouth 3 (three) times daily between meals. 07/13/20 08/12/20 Yes Johnson, Clanford L, MD  simvastatin (ZOCOR) 20 MG tablet Take 1 tablet (20 mg total) by mouth daily. Patient taking differently: Take 20 mg by mouth at bedtime.  06/27/20  Yes Gerlene Fee, NP  sitaGLIPtin (JANUVIA) 100 MG tablet Take 1 tablet (100 mg total) by mouth daily. 06/27/20  Yes Gerlene Fee, NP   Allergies  Allergen Reactions  . Codeine   . Zantac [Ranitidine Hcl]    Review of Systems  Unable to perform ROS: Dementia    Physical Exam Vitals and nursing note reviewed.  Constitutional:      Appearance: She is ill-appearing and toxic-appearing.  Cardiovascular:     Rate and Rhythm: Bradycardia present.  Pulmonary:     Effort: Bradypnea present.  Musculoskeletal:     Right lower leg: Edema present.     Left lower leg: Edema present.  Skin:    General: Skin is warm and dry.  Neurological:     Mental Status: She is disoriented.     Vital Signs: BP 100/77 (BP Location: Left Wrist)   Pulse (!) 47    Temp (!) 95 F (35 C) (Rectal)   Resp 19   Ht 5\' 5"  (1.651 m)   Wt 61 kg   SpO2 (!) 87%   BMI 22.38 kg/m  Pain Scale: PAINAD   Pain Score: 0-No pain   SpO2: SpO2: (!) 87 % O2 Device:SpO2: (!) 87 % O2 Flow Rate: .O2 Flow Rate (L/min): 3 L/min  IO: Intake/output summary:   Intake/Output Summary (Last 24 hours) at 07/22/2020 1038 Last data filed at 07/21/2020 1833 Gross per 24 hour  Intake 972.2 ml  Output --  Net 972.2 ml    LBM:   Baseline Weight: Weight: 68 kg Most recent weight: Weight: 61 kg     Palliative Assessment/Data:   Flowsheet Rows     Most Recent Value  Intake Tab  Referral Department Hospitalist  Unit at Time of Referral Cardiac/Telemetry Unit  Palliative Care Primary Diagnosis Pulmonary  Date Notified 07/19/20  Palliative Care Type Return patient Palliative Care  Reason for referral Clarify Goals of Care  Date of Admission 08/05/2020  Date first seen by Palliative Care 07/22/20  # of days Palliative referral response time 3 Day(s)  # of days IP prior to Palliative referral 1  Clinical Assessment  Palliative Performance Scale Score 30%  Pain Max last 24 hours Not able to report  Pain Min Last 24 hours Not able to report  Dyspnea Max Last 24 Hours Not able to report  Dyspnea Min Last 24 hours Not able to report  Psychosocial & Spiritual Assessment  Palliative Care Outcomes      Time In: 0930 Time Out: 1020 Time Total: 50 minutes  Greater than 50%  of this time was spent counseling and coordinating care related to the above assessment and plan.  Signed by: Drue Novel, NP   Please contact Palliative Medicine Team phone at [phone] for  questions and concerns.  For individual provider: See Shea Evans

## 2020-07-22 NOTE — Telephone Encounter (Signed)
Arvil Persons (sp?) NP with Palliative Care left a voice message and stated that she discussed with the family that they want to proceed with Hospice instead of Palliative Care. Joelene Millin would like to discuss if needed and you can reach her at [phone] or [phone] Ext [phone]. Joelene Millin stated that the patient had white patches in her mouth and looked like thrush, sore throat, and they canceled Endocrinology appointment. Joelene Millin did ask if Dr. Anastasio Champion or Judson Roch would be the attending physician for the patient.   Hailey with [facility] called and left a detailed VM that patient was sent to ER due to O2 being 86 without oxygen, faint heartbeat, swelling in all extremities, weeping in her right leg. Hailey did notice that her discharge from previous hospital visit stated that patient had heart failure and they did not put her on any medication for this. Hailey can be reached at [phone].  Patient is still currently admitted in the hospital.

## 2020-07-22 NOTE — Progress Notes (Addendum)
Patient Demographics:    Amy Hoffman, is a 79 y.o. female, DOB - 10/11/1940, YIA:[id number]  Admit date - 07/26/2020   Admitting Physician Bernadette Hoit, DO  Outpatient Primary MD for the patient is [name], NP  LOS - 4   Chief Complaint  Patient presents with  . Shortness of Breath    Patient complaining of SHOB and edema to BLE for the past two days. Recently discharged with new DX of CHF. Did not obtain RX for diuretic at discharge. Did not follow-up for this on Monday as planned. Reports weeping to BLE.         Subjective:    Amy Hoffman today has no fevers, no emesis,  No chest pain,    07/22/20 -Developed hypothermia requiring warming blanket -Remains bradycardic but bradycardia is not as severe-heart rate mostly in the 40s now -Hypoxia persist -Remains lethargic -Called and updated patient's daughter by phone also spoke with patient's son [name] at bedside  Assessment  & Plan :    Principal Problem:   Acute respiratory failure with hypoxia ([place]) Active Problems:   Essential hypertension   Palliative care by specialist   CHF (congestive heart failure) ([place])   Hyperlipidemia associated with type 2 diabetes mellitus ([place])   Pleural effusion   Hypoalbuminemia   Prolonged QT interval   Pericardial effusion   Hydronephrosis   Hyperglycemia due to diabetes mellitus (HCC)   Elevated brain natriuretic peptide (BNP) level   Atelectasis of left lung   Goals of care, counseling/discussion  Brief Summary:-  79 y.o. female with medical history significant for atrial fibrillation, dementia, chronic diastolic CHF, DM2, abnormal uterine bleeding, allergic rhinitis, anxiety, depression, dementia, right breast cancer, HTN, HLD, obesity, GERD and osteoarthritis admitted on 07/17/2020 with fatigue and worsening shortness of breath with oxygen requirement in the setting of diastolic dysfunction  congestive heart failure exacerbation with recurrent left-sided pleural effusion   A/p 1) acute on chronic hypoxic respiratory failure--as per patient's daughter patient likely used oxygen on and off previously -Currently requiring 3 L of oxygen via nasal cannula in the setting of recurrent left-sided pleural effusion and diastolic dysfunction CHF -Procalcitonin is less than 0.10 patient is afebrile, and without leukocytosis, doubt pneumonia-- --Patient's healthcare power of attorney/daughter reluctant to try Lasix at this time -Status post therapeutic Lt thoracentesis on 07/22/20 with removal of 1.1 L  2)HFpEF-patient with chronic diastolic dysfunction CHF with recurrent left-sided pleural effusion, had  thoracentesis was 07/11/2020 with 630 mils of fluid removed-please see #1 above - -Initially there was concern for possible pericardial effusion due to CT chest finding, however echocardiogram on 07/19/2020 and repeat limited echo on 07/22/2020 without significant pericardial effusion -Echo does show EF of 50 to 55% with regional wall motion normalities, along with severely dilated left atrium and small pericardial effusion -Per cardiology service no further intervention or work-up needed with regards to the small pericardial effusion -Cardiology consult from Dr. Domenic Polite noted and appreciated -Patient has limited treatment options--BP to soft for diuretics -Hoping for some improvement in the respiratory status and hypoxia with therapeutic thoracentesis  3) recurrent UTI--in the setting of hydronephrosis-CT abdomen and pelvis showed mild to moderate right hydronephrosis with a nondilated ureter which may reflect ureteropelvic junction obstruction -There  was suspicion of recurrent UTI this admission -patient has Klebsiella UTI few weeks ago -Urine culture from 07/28/2020 without growth, probably contaminated sample, -Patient has empirically been treated with IV Rocephin for at least 3 days , last  dose of Rocephin on 07/21/2020 -Continue Flomax and consider outpatient follow-up with urology  4) chronic atrial fibrillation/atrial flutter and history of tachybradycardia syndrome-- Echo as above #2, with severely dilated left atrium -CHA2DS2-VASc: 6 -Continue Eliquis  -EKG on 07/27/2020 with a QTC of 588, after stopping BuSpar, and previously stopping Aricept QTC appears to have improved - repeat EKG on 07/21/20 with a QTC of 430,  -Given persistent bradycardia per cardiology service okay to discontinue amiodarone, continue to avoid QT prolonging agents -TSH WNL -Treatment options are limited at this time given patient's poor functional status and overall declining health  5)DM2-A1c 7.7 reflecting uncontrolled DM with hyperglycemia -Use Novolog/Humalog Sliding scale insulin with Accu-Cheks/Fingersticks as ordered   6)HTN--hold amlodipine due to soft BP  7)Dementia with behavioral disturbance -Use Ativan as needed for agitation. --EKG on 08/09/2020 with a QTC of 588, after stopping BuSpar, and previously stopping Aricept QTC appears to have improved -Will not restart Aricept or BuSpar due to concerns about QT prolongation  8)Social/Ethics--extensive conversation with patient's daughter and POA about current condition, plan of care and goals of care -Patient is a DNR/DNI without any limitations to treatment at this time -She remains full scope of treatment -Apparently advanced home health previously recommended to patient's daughter and family that they should consider long-term placement and possibly palliative care/hospice involvement -Official palliative care consult appreciated -Overall prognosis is very poor, patient continues to decline  -Patient has developed persistent bradycardia, persistent hypoxia, soft blood pressures and persistent hypothermia requiring bear hugger  9)AKI----acute kidney injury --most likely due to a persistent hypotension/soft BP, poor oral intake      creatinine on admission= 0.8,  baseline creatinine = 0.7-0.8    ,  creatinine is now= 1.80, -- renally adjust medications, avoid nephrotoxic agents / dehydration  / hypotension -Patient with fluid/volume overload, and diastolic CHF exacerbation making it challenging to adequately hydrate her  10)Hypothermia----blood cultures pending recent urine culture was negative patient was recently empirically treated for possible UTI for her Rocephin but cultures came back negative -TSH WNL -Suspect hypotension due to poor perfusion overall  11)FEN--very very poor oral intake due to lethargy, give dextrose gently IV--concerns for volume overload//follow-up diastolic CHF worsening -Check CBGs to prevent significant hypoglycemia -Patient son tried to feed her- patient refuses oral intake  12)Lt Heel pressure ulcer/wounds-- POA--please see photos in epic, wound care consult appreciated--- Pressure Injury 07/19/20 Heel Left Unstageable - Full thickness tissue loss in which the base of the injury is covered by slough (yellow, tan, gray, green or brown) and/or eschar (tan, brown or black) in the wound bed. (Active)  Date First Assessed/Time First Assessed: 07/19/20 1700   Location: Heel  Location Orientation: Left  Staging: Unstageable - Full thickness tissue loss in which the base of the injury is covered by slough (yellow, tan, gray, green or brown) and/or esch...    Assessments 07/19/2020  5:59 PM 07/22/2020  8:01 AM  Dressing Type Foam - Lift dressing to assess site every shift Non adherent;Gauze (Comment);Foam - Lift dressing to assess site every shift  Dressing Changed Intact  Dressing Change Frequency -- Daily  Drainage Amount -- None     No Linked orders to display     Pressure Injury 07/20/20 Heel Left Stage 3 -  Full thickness tissue loss. Subcutaneous fat may be visible but bone, tendon or muscle are NOT exposed. (Active)  Date First Assessed/Time First Assessed: 07/20/20 1011   Location: Heel   Location Orientation: Left  Staging: Stage 3 -  Full thickness tissue loss. Subcutaneous fat may be visible but bone, tendon or muscle are NOT exposed.  Present on Admission: Yes    Assessments 07/20/2020  5:30 PM 07/22/2020  8:01 AM  Dressing Type -- Gauze (Comment)  Dressing -- Intact  Dressing Change Frequency -- Daily  Wound Length (cm) 1 cm --  Wound Width (cm) 0.5 cm --  Wound Surface Area (cm^2) 0.5 cm^2 --  Drainage Amount -- None     No Linked orders to display     Disposition/Need for in-Hospital Stay- patient unable to be discharged at this time due to acute hypoxic respiratory failure secondary to diastolic CHF exacerbation with pleural effusion requiring thoracentesis  Status is: Inpatient  Remains inpatient appropriate because:acute hypoxic respiratory failure secondary to CHF exacerbation with pleural effusion requiring thoracentesis  Disposition: The patient is from: Home              Anticipated d/c is to: Home with [place]              Anticipated d/c date is: 2 days              Patient currently is not medically stable to d/c. Barriers: Not Clinically Stable- acute hypoxic respiratory failure secondary to CHF exacerbation with pleural effusion requiring thoracentesis  Code Status : DNR  Family Communication:  Previously discussed with  daughter [name] by phone Left voicemail for patient's daughter/POA on 07/21/2020  Consults  :  na  DVT Prophylaxis  : Apixaban  Lab Results  Component Value Date   PLT 176 07/22/2020    Inpatient Medications  Scheduled Meds: . apixaban  2.5 mg Oral BID  . Chlorhexidine Gluconate Cloth  6 each Topical Daily  . insulin aspart  0-15 Units Subcutaneous Q4H  . nystatin  5 mL Oral QID  . simvastatin  20 mg Oral Daily  . tamsulosin  0.4 mg Oral QPC supper   Continuous Infusions:  PRN Meds:.   Anti-infectives (From admission, onward)   Start     Dose/Rate Route Frequency Ordered Stop    07/19/20 1600  cefTRIAXone (ROCEPHIN) 1 g in sodium chloride 0.9 % 100 mL IVPB        1 g 200 mL/hr over 30 Minutes Intravenous Every 24 hours 07/19/20 1536 07/21/20 1747        Objective:   Vitals:   07/22/20 1158 07/22/20 1333 07/22/20 1404 07/22/20 1653  BP: 111/69 (!) 105/42 (!) 101/38 (!) 111/41  Pulse: (!) 50 (!) 48 (!) 44 (!) 50  Resp: 18 20 17 18   Temp: (!) 97.2 F (36.2 C)  (!) 96.3 F (35.7 C) 97.8 F (36.6 C)  TempSrc: Rectal  Rectal Rectal  SpO2: 95% 100% 97% 96%  Weight:      Height:        Wt Readings from Last 3 Encounters:  07/22/20 61 kg  07/10/20 68 kg  07/09/20 60.3 kg     Intake/Output Summary (Last 24 hours) at 07/22/2020 1825 Last data filed at 07/21/2020 1833 Gross per 24 hour  Intake 384.78 ml  Output --  Net 384.78 ml    Physical Exam  Gen:- Awake Alert,  In no apparent distress  HEENT:-  [place].AT, No sclera icterus Nose- [place] 3L/min Neck-Supple Neck,No JVD,.  Lungs-diminished breath sounds on the left, faint bibasilar rales,  CV- S1, S2 normal, irregular  Abd-  +ve B.Sounds, Abd Soft, No tenderness,    Extremity/Skin:-  pedal pulses present  Psych---patient with cognitive and memory deficits,, episodes of disorientation and confusion  Neuro-generalized weakness, no new focal deficits, no tremors Msk:-Lt Heel pressure ulcer/wounds, necrotic eschar noted-please see photos in epic   Data Review:   Micro Results Recent Results (from the past 240 hour(s))  Urine Culture     Status: None   Collection Time: 08/12/2020 11:30 AM  Result Value Ref Range Status   Source: URINE  Final   Status: FINAL  Final   Result:   Final    Growth of mixed flora was isolated, suggesting probable contamination. No further testing will be performed. If clinically indicated, recollection using a method to minimize contamination, with prompt transfer to Urine Culture Transport Tube, is  recommended.   Resp Panel by RT PCR (RSV, Flu A&B, Covid) - Nasopharyngeal Swab      Status: None   Collection Time: 07/23/2020  3:25 PM   Specimen: Nasopharyngeal Swab  Result Value Ref Range Status   SARS Coronavirus 2 by RT PCR NEGATIVE NEGATIVE Final    Comment: (NOTE) SARS-CoV-2 target nucleic acids are NOT DETECTED.  The SARS-CoV-2 RNA is generally detectable in upper respiratoy specimens during the acute phase of infection. The lowest concentration of SARS-CoV-2 viral copies this assay can detect is 131 copies/mL. A negative result does not preclude SARS-Cov-2 infection and should not be used as the sole basis for treatment or other patient management decisions. A negative result may occur with  improper specimen collection/handling, submission of specimen other than nasopharyngeal swab, presence of viral mutation(s) within the areas targeted by this assay, and inadequate number of viral copies (<131 copies/mL). A negative result must be combined with clinical observations, patient history, and epidemiological information. The expected result is Negative.  Fact Sheet for Patients:  PinkCheek.be  Fact Sheet for Healthcare Providers:  GravelBags.it  This test is no t yet approved or cleared by the [place] FDA and  has been authorized for detection and/or diagnosis of SARS-CoV-2 by FDA under an Emergency Use Authorization (EUA). This EUA will remain  in effect (meaning this test can be used) for the duration of the COVID-19 declaration under Section 564(b)(1) of the Act, 21 U.S.C. section 360bbb-3(b)(1), unless the authorization is terminated or revoked sooner.     Influenza A by PCR NEGATIVE NEGATIVE Final   Influenza B by PCR NEGATIVE NEGATIVE Final    Comment: (NOTE) The Xpert Xpress SARS-CoV-2/FLU/RSV assay is intended as an aid in  the diagnosis of influenza from Nasopharyngeal swab specimens and  should not be used as a sole basis for treatment. Nasal washings and  aspirates are  unacceptable for Xpert Xpress SARS-CoV-2/FLU/RSV  testing.  Fact Sheet for Patients: PinkCheek.be  Fact Sheet for Healthcare Providers: GravelBags.it  This test is not yet approved or cleared by the [place] FDA and  has been authorized for detection and/or diagnosis of SARS-CoV-2 by  FDA under an Emergency Use Authorization (EUA). This EUA will remain  in effect (meaning this test can be used) for the duration of the  Covid-19 declaration under Section 564(b)(1) of the Act, 21  U.S.C. section 360bbb-3(b)(1), unless the authorization is  terminated or revoked.    Respiratory Syncytial Virus by PCR NEGATIVE NEGATIVE Final  Comment: (NOTE) Fact Sheet for Patients: PinkCheek.be  Fact Sheet for Healthcare Providers: GravelBags.it  This test is not yet approved or cleared by the [place] FDA and  has been authorized for detection and/or diagnosis of SARS-CoV-2 by  FDA under an Emergency Use Authorization (EUA). This EUA will remain  in effect (meaning this test can be used) for the duration of the  COVID-19 declaration under Section 564(b)(1) of the Act, 21 U.S.C.  section 360bbb-3(b)(1), unless the authorization is terminated or  revoked. Performed at [facility], [address] [place], [place] [address]   Culture, blood (Routine X 2) w Reflex to ID Panel     Status: None (Preliminary result)   Collection Time: 07/22/20  6:46 AM   Specimen: BLOOD  Result Value Ref Range Status   Specimen Description BLOOD BLOOD LEFT ARM  Final   Special Requests   Final    BOTTLES DRAWN AEROBIC AND ANAEROBIC Blood Culture adequate volume   Culture  Setup Time   Final    NO ORGANISMS SEEN ANAEROBIC BOTTLE ONLY Performed at [facility], [address], [place], [place] [address]    Culture PENDING  Incomplete   Report Status PENDING  Incomplete  Culture, blood  (Routine X 2) w Reflex to ID Panel     Status: None (Preliminary result)   Collection Time: 07/22/20  7:04 AM   Specimen: BLOOD  Result Value Ref Range Status   Specimen Description BLOOD BLOOD RIGHT ARM  Final   Special Requests   Final    BOTTLES DRAWN AEROBIC AND ANAEROBIC Blood Culture adequate volume Performed at [facility], [address], [place],  [address]    Culture PENDING  Incomplete   Report Status PENDING  Incomplete    Radiology Reports DG Chest 1 View  Result Date: 07/22/2020 CLINICAL DATA:  Pleural effusion, status post left thoracentesis EXAM: CHEST  1 VIEW COMPARISON:  07/22/2020 at 10:50 a.m. FINDINGS: Prominent reduction in the size of the left pleural effusion compared to earlier exam, with only mild residual blunting of left costophrenic angle. No significant pneumothorax is identified. Persistent moderate right pleural effusion. Moderate enlargement of the cardiopericardial silhouette noted. Atherosclerotic calcification of the aortic arch. 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 IMPRESSION: 1. Prominent reduction in the size of the left pleural effusion, with only mild residual blunting of the left costophrenic angle. No pneumothorax. 2. Persistent moderate right pleural effusion. 3. Moderate enlargement of the cardiopericardial silhouette. Electronically Signed   By: Van Clines M.D.   On: 07/22/2020 14:05   DG Chest 1 View  Result Date: 07/11/2020 CLINICAL DATA:  LEFT pleural effusion post thoracentesis EXAM: CHEST  1 VIEW COMPARISON:  07/10/2020 FINDINGS: Enlargement of cardiac silhouette with pulmonary vascular congestion. Atherosclerotic calcification aorta. Small bibasilar pleural effusions and atelectasis. Improved aeration and decreased pleural effusions/atelectasis at LEFT base since prior exam. No pneumothorax. Bones demineralized. IMPRESSION: No pneumothorax following LEFT thoracentesis. Electronically Signed   By: Lavonia Dana  M.D.   On: 07/11/2020 11:36   DG Chest 2 View  Result Date: 07/10/2020 CLINICAL DATA:  79 year old female with altered mental status. EXAM: CHEST - 2 VIEW COMPARISON:  Chest radiograph dated 06/30/2020. FINDINGS: There are bilateral pleural effusions, left greater right, which have increased in size compared to the prior radiograph. Bibasilar atelectasis or infiltrate, also increased since the prior radiograph. No pneumothorax. Stable cardiomegaly. Atherosclerotic calcification of the aorta. No acute osseous pathology. Surgical clips over the right chest as  well as sclerotic lesion of the proximal left humeral diaphysis similar to prior radiograph. IMPRESSION: Bilateral pleural effusions, left greater right, and bibasilar atelectasis or infiltrate, increased since the prior radiograph. Electronically Signed   By: Anner Crete M.D.   On: 07/10/2020 15:51   DG Chest 2 View  Result Date: 06/30/2020 CLINICAL DATA:  Foul-smelling urine and abnormal EKG EXAM: CHEST - 2 VIEW COMPARISON:  02/13/2020 FINDINGS: Cardiac shadow is enlarged. Aortic calcifications are noted. Lungs are well aerated bilaterally. Chronic blunting of the right costophrenic angle is noted. No new focal infiltrate is seen. Mild central vascular congestion is noted. IMPRESSION: Changes of mild CHF without edema. Chronic changes in the right base. Electronically Signed   By: Inez Catalina M.D.   On: 06/30/2020 15:32   CT HEAD WO CONTRAST  Result Date: 07/01/2020 CLINICAL DATA:  Altered mental status on top of baseline dementia. Chronic anticoagulation. EXAM: CT HEAD WITHOUT CONTRAST TECHNIQUE: Contiguous axial images were obtained from the base of the skull through the vertex without intravenous contrast. COMPARISON:  None. FINDINGS: Brain: Examination is technically limited by motion artifact. There is evidence of diffuse cerebral atrophy. Ventricular dilatation is likely due to central atrophy. Low-attenuation changes in the deep  white matter likely represent small vessel ischemic change. No obvious mass effect or midline shift. No acute intracranial hemorrhage is demonstrated. No abnormal extra-axial fluid collections. Vascular: No acute vascular abnormality is identified. Skull: Visualized calvarium appears intact. Sinuses/Orbits: Visualized paranasal sinuses and mastoid air cells are clear. Other: None. IMPRESSION: 1. Technically limited study due to motion artifact. 2. No acute intracranial abnormalities are demonstrated. 3. Chronic atrophy and small vessel ischemic changes. Electronically Signed   By: Lucienne Capers M.D.   On: 07/01/2020 00:43   CT Angio Chest PE W and/or Wo Contrast  Result Date: 08/08/2020 CLINICAL DATA:  Dementia patient with shortness of breath and abdominal pain. EXAM: CT ANGIOGRAPHY CHEST CT ABDOMEN AND PELVIS WITH CONTRAST TECHNIQUE: Multidetector CT imaging of the chest was performed using the standard protocol during bolus administration of intravenous contrast. Multiplanar CT image reconstructions and MIPs were obtained to evaluate the vascular anatomy. Multidetector CT imaging of the abdomen and pelvis was performed using the standard protocol during bolus administration of intravenous contrast. CONTRAST:  68mL OMNIPAQUE IOHEXOL 350 MG/ML SOLN COMPARISON:  Same day chest radiograph, CT chest dated 02/06/2020, CT abdomen pelvis dated 02/04/2019. FINDINGS: CTA CHEST FINDINGS Cardiovascular: Satisfactory opacification of the pulmonary arteries to the segmental level. No evidence of pulmonary embolism. Vascular calcifications are seen in the coronary arteries and aortic arch. There heart is enlarged and there is a moderate to large pericardial effusion. Mediastinum/Nodes: No enlarged mediastinal, hilar, or axillary lymph nodes. Aspirated debris is seen in the trachea (series 8 images 30 3-38). Thyroid gland and esophagus demonstrate no significant findings. Lungs/Pleura: There is a large left and a small  right pleural effusion with associated atelectasis. There are mild scattered bilateral ground-glass opacities. There is no pneumothorax. Musculoskeletal: Degenerative changes are seen in the spine. Review of the MIP images confirms the above findings. CT ABDOMEN and PELVIS FINDINGS Hepatobiliary: No focal liver abnormality is seen. The patient is status post cholecystectomy. There is moderate intra and extrahepatic biliary ductal dilatation with the common bile duct measuring up to 17 mm in size. Pancreas: Unremarkable. No pancreatic ductal dilatation or surrounding inflammatory changes. Spleen: Normal in size without focal abnormality. Adrenals/Urinary Tract: Adrenal glands are unremarkable. There is mild-to-moderate right hydronephrosis with a nondilated ureter which  may reflect ureteropelvic junction obstruction. There is no hydronephrosis on the left. No renal calculi or focal renal lesion is identified on either side. Bladder is unremarkable. Stomach/Bowel: Stomach is within normal limits. No pericecal inflammatory changes are noted to suggest acute appendicitis. There is colonic diverticulosis without evidence of diverticulitis. No evidence of bowel wall thickening, distention, or inflammatory changes. Vascular/Lymphatic: Aortic atherosclerosis. No enlarged abdominal or pelvic lymph nodes. Reproductive: Status post hysterectomy. No adnexal masses. Other: No abdominal wall hernia or abnormality. No abdominopelvic ascites. Anasarca is noted. Musculoskeletal: Degenerative changes are seen in the spine. There is 6 mm anterolisthesis of L4 on L5, not significantly changed since 12/05/2018. Review of the MIP images confirms the above findings. IMPRESSION: 1. No evidence of pulmonary embolism. 2. Large left and small right pleural effusions with associated atelectasis. 3. Mild scattered bilateral ground-glass opacities likely represent atelectasis and/or pneumonia. 4. Cardiomegaly with a moderate to large pericardial  effusion. 5. Moderate intra and extrahepatic biliary ductal dilatation with the common bile duct measuring up to 17 mm in size. This may reflect post cholecystectomy state and patient age. Correlation with liver function tests is recommended. 6. Mild-to-moderate right hydronephrosis with a nondilated ureter which may reflect ureteropelvic junction obstruction. Aortic Atherosclerosis (ICD10-I70.0). Electronically Signed   By: Zerita Boers M.D.   On: 08/04/2020 18:08   CT ABDOMEN PELVIS W CONTRAST  Result Date: 07/27/2020 CLINICAL DATA:  Dementia patient with shortness of breath and abdominal pain. EXAM: CT ANGIOGRAPHY CHEST CT ABDOMEN AND PELVIS WITH CONTRAST TECHNIQUE: Multidetector CT imaging of the chest was performed using the standard protocol during bolus administration of intravenous contrast. Multiplanar CT image reconstructions and MIPs were obtained to evaluate the vascular anatomy. Multidetector CT imaging of the abdomen and pelvis was performed using the standard protocol during bolus administration of intravenous contrast. CONTRAST:  18mL OMNIPAQUE IOHEXOL 350 MG/ML SOLN COMPARISON:  Same day chest radiograph, CT chest dated 02/06/2020, CT abdomen pelvis dated 02/04/2019. FINDINGS: CTA CHEST FINDINGS Cardiovascular: Satisfactory opacification of the pulmonary arteries to the segmental level. No evidence of pulmonary embolism. Vascular calcifications are seen in the coronary arteries and aortic arch. There heart is enlarged and there is a moderate to large pericardial effusion. Mediastinum/Nodes: No enlarged mediastinal, hilar, or axillary lymph nodes. Aspirated debris is seen in the trachea (series 8 images 30 3-38). Thyroid gland and esophagus demonstrate no significant findings. Lungs/Pleura: There is a large left and a small right pleural effusion with associated atelectasis. There are mild scattered bilateral ground-glass opacities. There is no pneumothorax. Musculoskeletal: Degenerative changes  are seen in the spine. Review of the MIP images confirms the above findings. CT ABDOMEN and PELVIS FINDINGS Hepatobiliary: No focal liver abnormality is seen. The patient is status post cholecystectomy. There is moderate intra and extrahepatic biliary ductal dilatation with the common bile duct measuring up to 17 mm in size. Pancreas: Unremarkable. No pancreatic ductal dilatation or surrounding inflammatory changes. Spleen: Normal in size without focal abnormality. Adrenals/Urinary Tract: Adrenal glands are unremarkable. There is mild-to-moderate right hydronephrosis with a nondilated ureter which may reflect ureteropelvic junction obstruction. There is no hydronephrosis on the left. No renal calculi or focal renal lesion is identified on either side. Bladder is unremarkable. Stomach/Bowel: Stomach is within normal limits. No pericecal inflammatory changes are noted to suggest acute appendicitis. There is colonic diverticulosis without evidence of diverticulitis. No evidence of bowel wall thickening, distention, or inflammatory changes. Vascular/Lymphatic: Aortic atherosclerosis. No enlarged abdominal or pelvic lymph nodes. Reproductive: Status post hysterectomy.  No adnexal masses. Other: No abdominal wall hernia or abnormality. No abdominopelvic ascites. Anasarca is noted. Musculoskeletal: Degenerative changes are seen in the spine. There is 6 mm anterolisthesis of L4 on L5, not significantly changed since 12/05/2018. Review of the MIP images confirms the above findings. IMPRESSION: 1. No evidence of pulmonary embolism. 2. Large left and small right pleural effusions with associated atelectasis. 3. Mild scattered bilateral ground-glass opacities likely represent atelectasis and/or pneumonia. 4. Cardiomegaly with a moderate to large pericardial effusion. 5. Moderate intra and extrahepatic biliary ductal dilatation with the common bile duct measuring up to 17 mm in size. This may reflect post cholecystectomy state  and patient age. Correlation with liver function tests is recommended. 6. Mild-to-moderate right hydronephrosis with a nondilated ureter which may reflect ureteropelvic junction obstruction. Aortic Atherosclerosis (ICD10-I70.0). Electronically Signed   By: Zerita Boers M.D.   On: 07/30/2020 18:08   DG Chest Port 1 View  Result Date: 07/22/2020 CLINICAL DATA:  Shortness of breath EXAM: PORTABLE CHEST 1 VIEW COMPARISON:  07/21/2020 FINDINGS: Cardiac shadow is stable. Bilateral pleural effusions are again identified. Central vascular congestion is again seen. Right basilar consolidation is noted stable from the prior exam. Stable calcifications in the proximal left humerus. No other bony abnormality is noted. IMPRESSION: Persistent bilateral pleural effusions and vascular congestion. Consolidation in the right base is again noted. Electronically Signed   By: Inez Catalina M.D.   On: 07/22/2020 11:10   DG CHEST PORT 1 VIEW  Result Date: 07/21/2020 CLINICAL DATA:  Shortness of breath EXAM: PORTABLE CHEST 1 VIEW COMPARISON:  July 18, 2020 FINDINGS: There is cardiomegaly. There are persistent bilateral pleural effusions which have increased in size from the prior study. There are findings of congestive heart failure. There is no pneumothorax. Bibasilar airspace opacities are noted in favored to represent compressive atelectasis. There is a sclerotic lesion in the proximal left humerus favored to represent a bone infarct or enchondroma. IMPRESSION: 1. Cardiomegaly with findings of congestive heart failure. 2. Increasing bilateral pleural effusions with adjacent compressive atelectasis. Electronically Signed   By: Constance Holster M.D.   On: 07/21/2020 20:58   DG Chest Port 1 View  Result Date: 07/17/2020 CLINICAL DATA:  Shortness of breath. EXAM: PORTABLE CHEST 1 VIEW COMPARISON:  Radiograph 07/11/2020.  CT 02/06/2020 FINDINGS: Stable cardiomegaly. Unchanged mediastinal contours. Bilateral pleural  effusions, not significantly changed on the right and increased on the left. Increased left basilar airspace disease typically atelectasis. Vascular congestion is similar. There is no pneumothorax. Stable osseous structures. Surgical clips in the right axilla. IMPRESSION: 1. Increased left pleural effusion with progressive left basilar airspace disease over the past week, typically atelectasis. 2. No significant change in right pleural effusion. 3. Stable cardiomegaly and vascular congestion. Electronically Signed   By: Keith Rake M.D.   On: 07/23/2020 16:03   ECHOCARDIOGRAM COMPLETE  Result Date: 07/19/2020    ECHOCARDIOGRAM REPORT   Patient Name:   SHURONDA SANTINO Sloma Date of Exam: 07/19/2020 Medical Rec #:  [id number]       Height:       65.0 in Accession #:    [id number]      Weight:       149.9 lb Date of Birth:  November 19, 1940       BSA:          1.750 m Patient Age:    9 years        BP:  125/75 mmHg Patient Gender: F               HR:           107 bpm. Exam Location:  [facility] Procedure: 2D Echo Indications:    Pericardial effusion 423.9 / I31.3  History:        Patient has prior history of Echocardiogram examinations, most                 recent 12/17/2019. CHF, Pericardial Disease; Risk                 Factors:Hypertension, Dyslipidemia, Non-Smoker and Diabetes.                 Dementia, Bradycardia.  Sonographer:    Leavy Cella RDCS (AE) Referring Phys: [id number] OLADAPO ADEFESO IMPRESSIONS  1. Left ventricular ejection fraction, by estimation, is 50 to 55%. The left ventricle has low normal function. The left ventricle demonstrates regional wall motion abnormalities (see scoring diagram/findings for description). Left ventricular diastolic  parameters are indeterminate.  2. Right ventricular systolic function is low normal. The right ventricular size is normal.  3. Left atrial size was severely dilated.  4. A small pericardial effusion is present. The pericardial effusion is circumferential.  There is no evidence of cardiac tamponade.  5. The mitral valve is normal in structure. Trivial mitral valve regurgitation. No evidence of mitral stenosis.  6. The aortic valve is tricuspid. Aortic valve regurgitation is not visualized. No aortic stenosis is present. FINDINGS  Left Ventricle: Left ventricular ejection fraction, by estimation, is 50 to 55%. The left ventricle has low normal function. The left ventricle demonstrates regional wall motion abnormalities. The left ventricular internal cavity size was normal in size. There is no left ventricular hypertrophy. Left ventricular diastolic parameters are indeterminate. Right Ventricle: The right ventricular size is normal. No increase in right ventricular wall thickness. Right ventricular systolic function is low normal. Left Atrium: Left atrial size was severely dilated. Right Atrium: Right atrial size was normal in size. Pericardium: A small pericardial effusion is present. The pericardial effusion is circumferential. There is no evidence of cardiac tamponade. Mitral Valve: The mitral valve is normal in structure. Trivial mitral valve regurgitation. No evidence of mitral valve stenosis. Tricuspid Valve: The tricuspid valve is normal in structure. Tricuspid valve regurgitation is mild . No evidence of tricuspid stenosis. Aortic Valve: The aortic valve is tricuspid. Aortic valve regurgitation is not visualized. No aortic stenosis is present. Aortic valve mean gradient measures 4.1 mmHg. Aortic valve peak gradient measures 6.6 mmHg. Aortic valve area, by VTI measures 1.43 cm. Pulmonic Valve: The pulmonic valve was not well visualized. Pulmonic valve regurgitation is not visualized. No evidence of pulmonic stenosis. Aorta: The aortic root is normal in size and structure. Pulmonary Artery: 34.  Venous: The inferior vena cava was not well visualized. IAS/Shunts: The interatrial septum was not well visualized.  LEFT VENTRICLE PLAX 2D LVIDd:         3.91 cm   Diastology LVIDs:         3.08 cm  LV e' medial:    5.22 cm/s LV PW:         0.90 cm  LV E/e' medial:  17.4 LV IVS:        0.88 cm  LV e' lateral:   5.15 cm/s LVOT diam:     1.90 cm  LV E/e' lateral: 17.6 LV SV:         45  LV SV Index:   26 LVOT Area:     2.84 cm  RIGHT VENTRICLE RV S prime:     6.18 cm/s TAPSE (M-mode): 1.6 cm LEFT ATRIUM             Index       RIGHT ATRIUM           Index LA diam:        4.40 cm 2.51 cm/m  RA Area:     15.80 cm LA Vol (A2C):   61.0 ml 34.86 ml/m RA Volume:   39.80 ml  22.74 ml/m LA Vol (A4C):   72.0 ml 41.14 ml/m LA Biplane Vol: 68.8 ml 39.31 ml/m  AORTIC VALVE AV Area (Vmax):    1.44 cm AV Area (Vmean):   1.40 cm AV Area (VTI):     1.43 cm AV Vmax:           128.76 cm/s AV Vmean:          95.477 cm/s AV VTI:            0.313 m AV Peak Grad:      6.6 mmHg AV Mean Grad:      4.1 mmHg LVOT Vmax:         65.55 cm/s LVOT Vmean:        47.156 cm/s LVOT VTI:          0.158 m LVOT/AV VTI ratio: 0.50  AORTA Ao Root diam: 2.60 cm MITRAL VALVE               TRICUSPID VALVE MV Area (PHT): 2.90 cm    TR Peak grad:   33.6 mmHg MV Decel Time: 262 msec    TR Vmax:        290.00 cm/s MV E velocity: 90.80 cm/s MV A velocity: 66.00 cm/s  SHUNTS MV E/A ratio:  1.38        Systemic VTI:  0.16 m                            Systemic Diam: 1.90 cm Carlyle Dolly MD Electronically signed by Carlyle Dolly MD Signature Date/Time: 07/19/2020/12:39:52 PM    Final    ECHOCARDIOGRAM LIMITED  Result Date: 07/22/2020    ECHOCARDIOGRAM LIMITED REPORT   Patient Name:   ZIANA HEYLIGER Henault Date of Exam: 07/22/2020 Medical Rec #:  [id number]       Height:       65.0 in Accession #:    [id number]      Weight:       134.5 lb Date of Birth:  Jul 15, 1941       BSA:          1.671 m Patient Age:    59 years        BP:           100/77 mmHg Patient Gender: F               HR:           47 bpm. Exam Location:  [facility] Procedure: Limited Echo                                 MODIFIED REPORT:  This report was  modified by Rozann Lesches MD on 07/22/2020 due to Additional  information.  Indications:     Pericardial effusion 423.9 / I31.3  History:         Patient has prior history of Echocardiogram examinations, most                  recent 07/19/2020. CHF; Risk Factors:Hypertension, Dyslipidemia,                  Non-Smoker and Diabetes. Dysphagia, Bradycardia, Breast Cancer.  Sonographer:     Leavy Cella RDCS (AE) Referring Phys:  [id number] Kampbell Holaway Diagnosing Phys: Rozann Lesches MD IMPRESSIONS  1. Limited study for re-evaluation of pericardial effusion.  2. Left ventricular ejection fraction, by estimation, is 60 to 65%. The left ventricle has normal function. The left ventricle has no regional wall motion abnormalities.  3. Right ventricular systolic function is normal. The right ventricular size is normal. There is mildly elevated pulmonary artery systolic pressure. The estimated right ventricular systolic pressure is 42.5 mmHg.  4. A small pericardial effusion is present. The pericardial effusion is circumferential. Large pleural effusion in the left lateral region.  5. The mitral valve is grossly normal. Trivial mitral valve regurgitation.  6. Tricuspid valve regurgitation is mild to moderate.  7. The aortic valve is tricuspid. There is mild thickening of the aortic valve.  8. The inferior vena cava is normal in size with greater than 50% respiratory variability, suggesting right atrial pressure of 3 mmHg. FINDINGS  Left Ventricle: Left ventricular ejection fraction, by estimation, is 60 to 65%. The left ventricle has normal function. The left ventricle has no regional wall motion abnormalities. The left ventricular internal cavity size was normal in size. There is  borderline left ventricular hypertrophy. Right Ventricle: The right ventricular size is normal. No increase in right ventricular wall thickness. Right ventricular systolic function is normal. There is mildly  elevated pulmonary artery systolic pressure. The tricuspid regurgitant velocity is 2.90  m/s, and with an assumed right atrial pressure of 3 mmHg, the estimated right ventricular systolic pressure is 95.6 mmHg. Right Atrium: Prominent Chiari network. Pericardium: A small pericardial effusion is present. The pericardial effusion is circumferential. Mitral Valve: The mitral valve is grossly normal. Trivial mitral valve regurgitation. Tricuspid Valve: The tricuspid valve is grossly normal. Tricuspid valve regurgitation is mild to moderate. Aortic Valve: The aortic valve is tricuspid. There is mild thickening of the aortic valve. There is mild aortic valve annular calcification. Aorta: The aortic root is normal in size and structure. Venous: The inferior vena cava is normal in size with greater than 50% respiratory variability, suggesting right atrial pressure of 3 mmHg. Additional Comments: There is a large pleural effusion in the left lateral region. LEFT VENTRICLE PLAX 2D LVIDd:         4.31 cm LVIDs:         2.34 cm LV PW:         1.53 cm LV IVS:        0.87 cm  LEFT ATRIUM         Index LA diam:    4.50 cm 2.69 cm/m   AORTA Ao Root diam: 2.60 cm MITRAL VALVE                TRICUSPID VALVE MV Area (PHT): 2.74 cm     TR Peak grad:   33.6 mmHg MV Decel Time: 277 msec     TR Vmax:        290.00 cm/s MV E velocity: 116.00 cm/s MV A  velocity: 48.40 cm/s MV E/A ratio:  2.40 Rozann Lesches MD Electronically signed by Rozann Lesches MD Signature Date/Time: 07/22/2020/12:23:10 PM    Final (Updated)    US THORACENTESIS ASP PLEURAL SPACE W/IMG GUIDE  Result Date: 07/22/2020 INDICATION: Left pleural effusion EXAM: ULTRASOUND GUIDED LEFT THORACENTESIS MEDICATIONS: None. COMPLICATIONS: None immediate. PROCEDURE: The patient has dementia and is not able to consent for herself. I spoke to the patient's daughter by telephone. We discussed the reason for today's thoracentesis as well as the risks (including hemorrhage,  infection, and pneumothorax), benefits, and alternatives to ultrasound-guided thoracentesis. We discussed the high likelihood of success of the procedure. The patient's daughter understood and elected for the patient undergo the procedure. Standard time-out was employed. Following sterile skin prep and local anesthetic administration consisting of 1% lidocaine, and following ultrasound localization, a Yueh catheter was advanced without difficulty into the left pleural fluid. Amber fluid was returned, and the needle removed with the catheter left in place. A total of 1100 cc of amber fluid was removed. The catheter was subsequently removed in the skin cleansed and bandaged. No immediate complication was observed. Following the procedure, a chest radiograph demonstrated no significant pneumothorax and marked reduction of the left pleural effusion. FINDINGS: A total of approximately 1100 cc of amber fluid was removed. IMPRESSION: Successful ultrasound guided left thoracentesis yielding 1100 cc of pleural fluid. Electronically Signed   By: Van Clines M.D.   On: 07/22/2020 14:02   US THORACENTESIS ASP PLEURAL SPACE W/IMG GUIDE  Result Date: 07/11/2020 INDICATION: LEFT pleural effusion EXAM: ULTRASOUND GUIDED DIAGNOSTIC AND THERAPEUTIC THORACENTESIS MEDICATIONS: None COMPLICATIONS: None immediate PROCEDURE: An ultrasound guided thoracentesis was thoroughly discussed with the patient and questions answered. The benefits, risks, alternatives and complications were also discussed. The patient understands and wishes to proceed with the procedure. Written consent was obtained. Ultrasound was performed to localize and mark an adequate pocket of fluid in the LEFT chest. The area was then prepped and draped in the normal sterile fashion. 1% Lidocaine was used for local anesthesia. Under ultrasound guidance a 8 French thoracentesis catheter was introduced. Thoracentesis was performed. The catheter was removed and a  dressing applied. FINDINGS: A total of approximately 630 mL of yellow LEFT pleural fluid was removed. Samples were sent to the laboratory as requested by the clinical team. IMPRESSION: Successful ultrasound guided LEFT thoracentesis yielding 630 mL of pleural fluid. Electronically Signed   By: Lavonia Dana M.D.   On: 07/11/2020 11:35     CBC Recent Labs  Lab 08/12/2020 1500 07/19/20 0351 07/21/20 0543 07/22/20 0629  WBC 9.9 9.5 11.5* 8.9  HGB 10.4* 10.0* 8.6* 8.7*  HCT 35.1* 35.7* 31.0* 32.4*  PLT 315 330 220 176  MCV 87.5 90.6 93.4 95.9  MCH 25.9* 25.4* 25.9* 25.7*  MCHC 29.6* 28.0* 27.7* 26.9*  RDW 16.2* 16.3* 16.4* 16.5*  LYMPHSABS 0.8  --   --   --   MONOABS 0.8  --   --   --   EOSABS 0.1  --   --   --   BASOSABS 0.0  --   --   --     Chemistries  Recent Labs  Lab 08/08/2020 1500 07/19/20 0351 07/21/20 0543 07/21/20 2344 07/22/20 0629  NA 142 145 144 143 144  K 4.6 4.8 4.8 5.1 5.0  CL 107 108 107 110 107  CO2 30 29 29 25 28   GLUCOSE 183* 167* 143* 137* 155*  BUN 22 18 27* 30* 34*  CREATININE  0.80 0.70 1.34* 1.69* 1.80*  CALCIUM 8.8* 8.9 8.6* 8.2* 8.5*  MG  --  2.0  --  2.2  --   AST 22 20  --   --   --   ALT 35 33  --   --   --   ALKPHOS 106 95  --   --   --   BILITOT 0.9 0.7  --   --   --    ------------------------------------------------------------------------------------------------------------------ No results for input(s): CHOL, HDL, LDLCALC, TRIG, CHOLHDL, LDLDIRECT in the last 72 hours.  Lab Results  Component Value Date   HGBA1C 7.7 (H) 06/30/2020   Recent Labs    07/22/20 1433  TSH 2.712   ------------------------------------------------------------------------------------------------------------------ No results for input(s): VITAMINB12, FOLATE, FERRITIN, TIBC, IRON, RETICCTPCT in the last 72 hours.  Coagulation profile Recent Labs  Lab 07/19/20 0351  INR 1.3*    No results for input(s): DDIMER in the last 72 hours.  Cardiac Enzymes No  results for input(s): CKMB, TROPONINI, MYOGLOBIN in the last 168 hours.  Invalid input(s): CK ------------------------------------------------------------------------------------------------------------------    Component Value Date/Time   BNP 375.0 (H) 08/09/2020 1500   Roxan Hockey M.D on 07/22/2020 at 6:25 PM  Go to [email or web address] - for contact info  [facility] - Office  [phone]

## 2020-07-22 NOTE — Consult Note (Signed)
Cardiology Consultation:   Patient ID: Amy Hoffman; [id number]; 1941/08/31   Admit date: 08/04/2020 Date of Consult: 07/22/2020  Primary Care Provider: [name], NP Primary Cardiologist: Carlyle Dolly, MD Primary Electrophysiologist: None   Patient Profile:   Amy Hoffman is an 79 y.o. 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 Denton Brick.  History of Present Illness   Ms. Kontz is currently admitted to the hospital with acute on chronic hypoxic respiratory failure complicated by recurring left pleural effusion potentially related diastolic dysfunction.  She has a history of paroxysmal atrial fibrillation/flutter, most recently as an outpatient has been on oral amiodarone, had previously been taken off Lopressor at a prior hospitalization related to intermittent symptomatic bradycardia and posttermination pauses.  She is on Eliquis for stroke prophylaxis with CHA2DS2-VASc score of 6.  Left atrium is severely dilated suggesting rhythm control likely to be very difficult despite antiarrhythmic therapy.  She has significant dementia at baseline.  So far she has been taken off Aricept and BuSpar with QT prolongation by ECG, also with significant bradycardia.  Amiodarone was discontinued as well although she did receive a 100 mg dose yesterday.  Heart rates were in the 30s requiring atropine, today she is in sinus bradycardia with heart rate in the mid 40s.  She has also had relative hypotension and recent hypothermia.  Infectious work-up fairly complete per primary team, treated for UTI and has completed Rocephin.  Follow-up blood cultures have been sent.  Patient also noted to have DNR status, although discussions  about considering palliative care or hospice have not yet been finalized with patient's family.  Chest CTA from November 4 reported no pulmonary embolus, large left and small right pleural effusions, also what was described as a moderate to large pericardial effusion.  Echocardiogram on November 5 reported LVEF 50 to 55%, normal RV contraction, and a small circumferential pericardial effusion.  There has been additional discussion about possible therapeutic thoracentesis, not yet finalized.  With recent low blood pressures she has not been on diuretics.  Also showing worsening renal function, possibly component of ATN with recent bradycardia and hypotension.  Past Medical History:  Diagnosis Date  . Allergic rhinitis due to pollen   . Anxiety   . Asthma   . Atrial fibrillation and flutter ([facility])   . Breast cancer ([facility])    Right  . Dementia ([place])   . Depression   . Essential hypertension   . History of depression   . Hyperlipidemia   . Osteoarthritis   . Reflux esophagitis   . Sinus node dysfunction ([facility])   . Trigger finger, acquired   . Type 2 diabetes mellitus ([place])     Past Surgical History:  Procedure Laterality Date  . CHOLECYSTECTOMY    . MASTECTOMY Right   . RE-EXCISION OF BREAST LUMPECTOMY       Inpatient Medications: Scheduled Meds: . apixaban  2.5 mg Oral BID  . Chlorhexidine Gluconate Cloth  6 each Topical Daily  . insulin aspart  0-15 Units Subcutaneous Q4H  . nystatin  5 mL Oral QID  . simvastatin  20 mg Oral Daily  . tamsulosin  0.4 mg Oral QPC supper    PRN Meds: acetaminophen, Resource ThickenUp Clear  Allergies:    Allergies  Allergen  Reactions  . Codeine   . Zantac [Ranitidine Hcl]     Social History:   Social History   Tobacco Use  . Smoking status: Never Smoker  . Smokeless tobacco: Never Used  Substance Use Topics  . Alcohol use: No    Family History:   The patient's Family history is unknown by patient.  ROS:  Please see the  history of present illness.  Unable to assess due to baseline dementia.  Physical Exam/Data:   Vitals:   07/22/20 0414 07/22/20 0500 07/22/20 0755 07/22/20 0759  BP: 93/63  100/77   Pulse: 87  (!) 47   Resp: 20  19   Temp: (!) 92.1 F (33.4 C)  (!) 94.9 F (34.9 C) (!) 95 F (35 C)  TempSrc:   Rectal Rectal  SpO2: (!) 86%  (!) 87%   Weight:  61 kg    Height:        Intake/Output Summary (Last 24 hours) at 07/22/2020 0950 Last data filed at 07/21/2020 1833 Gross per 24 hour  Intake 972.2 ml  Output --  Net 972.2 ml   Filed Weights   08/09/2020 1447 07/21/20 0500 07/22/20 0500  Weight: 68 kg 61.1 kg 61 kg   Body mass index is 22.38 kg/m.   Gen: Frail-appearing elderly woman, does not provide verbal history or symptoms at this time. HEENT: Conjunctiva and lids normal, oropharynx clear. Neck: Supple, elevated JVP, no carotid bruits. Lungs: Significantly decreased breath sounds on the left, also right base, no wheezing. Cardiac: Regular rate and rhythm, soft systolic murmur, no pericardial rub. Abdomen: Soft, nontender, bowel sounds present. Extremities: No pitting edema. Skin: No rash. Musculoskeletal: Kyphosis. Neuropsychiatric: Awake, difficult to determine if she is oriented.  EKG:  An ECG dated 07/21/2020 was personally reviewed today and demonstrated:  Marked sinus bradycardia, borderline low voltage in the limb leads, QTc 408 ms, nonspecific T wave changes.  Telemetry:  I personally reviewed telemetry which shows sinus bradycardia at 45 bpm, no pauses.  Relevant CV Studies:  Echocardiogram 07/19/2020: 1. Left ventricular ejection fraction, by estimation, is 50 to 55%. The  left ventricle has low normal function. The left ventricle demonstrates  regional wall motion abnormalities (see scoring diagram/findings for  description). Left ventricular diastolic  parameters are indeterminate.  2. Right ventricular systolic function is low normal. The right  ventricular  size is normal.  3. Left atrial size was severely dilated.  4. A small pericardial effusion is present. The pericardial effusion is  circumferential. There is no evidence of cardiac tamponade.  5. The mitral valve is normal in structure. Trivial mitral valve  regurgitation. No evidence of mitral stenosis.  6. The aortic valve is tricuspid. Aortic valve regurgitation is not  visualized. No aortic stenosis is present.   Laboratory Data:  Chemistry Recent Labs  Lab 07/21/20 0543 07/21/20 2344 07/22/20 0629  NA 144 143 144  K 4.8 5.1 5.0  CL 107 110 107  CO2 29 25 28   GLUCOSE 143* 137* 155*  BUN 27* 30* 34*  CREATININE 1.34* 1.69* 1.80*  CALCIUM 8.6* 8.2* 8.5*  GFRNONAA 40* 30* 28*  ANIONGAP 8 8 9     Recent Labs  Lab 08/06/2020 1500 07/19/20 0351  PROT 6.4* 6.1*  ALBUMIN 2.9* 2.7*  AST 22 20  ALT 35 33  ALKPHOS 106 95  BILITOT 0.9 0.7   Hematology Recent Labs  Lab 07/19/20 0351 07/21/20 0543 07/22/20 0629  WBC 9.5 11.5* 8.9  RBC 3.94 3.32* 3.38*  HGB 10.0* 8.6* 8.7*  HCT 35.7* 31.0* 32.4*  MCV 90.6 93.4 95.9  MCH 25.4* 25.9* 25.7*  MCHC 28.0* 27.7* 26.9*  RDW 16.3* 16.4* 16.5*  PLT 330 220 176   Cardiac Enzymes Recent Labs  Lab 06/30/20 1441 06/30/20 1636 07/21/20 2344 07/22/20 0629  TROPONINIHS 14 14 11 11    BNP Recent Labs  Lab 07/25/2020 1500  BNP 375.0*     Radiology/Studies:  CT Angio Chest PE W and/or Wo Contrast  Result Date: 07/17/2020 CLINICAL DATA:  Dementia patient with shortness of breath and abdominal pain. EXAM: CT ANGIOGRAPHY CHEST CT ABDOMEN AND PELVIS WITH CONTRAST TECHNIQUE: Multidetector CT imaging of the chest was performed using the standard protocol during bolus administration of intravenous contrast. Multiplanar CT image reconstructions and MIPs were obtained to evaluate the vascular anatomy. Multidetector CT imaging of the abdomen and pelvis was performed using the standard protocol during bolus administration of  intravenous contrast. CONTRAST:  78mL OMNIPAQUE IOHEXOL 350 MG/ML SOLN COMPARISON:  Same day chest radiograph, CT chest dated 02/06/2020, CT abdomen pelvis dated 02/04/2019. FINDINGS: CTA CHEST FINDINGS Cardiovascular: Satisfactory opacification of the pulmonary arteries to the segmental level. No evidence of pulmonary embolism. Vascular calcifications are seen in the coronary arteries and aortic arch. There heart is enlarged and there is a moderate to large pericardial effusion. Mediastinum/Nodes: No enlarged mediastinal, hilar, or axillary lymph nodes. Aspirated debris is seen in the trachea (series 8 images 30 3-38). Thyroid gland and esophagus demonstrate no significant findings. Lungs/Pleura: There is a large left and a small right pleural effusion with associated atelectasis. There are mild scattered bilateral ground-glass opacities. There is no pneumothorax. Musculoskeletal: Degenerative changes are seen in the spine. Review of the MIP images confirms the above findings. CT ABDOMEN and PELVIS FINDINGS Hepatobiliary: No focal liver abnormality is seen. The patient is status post cholecystectomy. There is moderate intra and extrahepatic biliary ductal dilatation with the common bile duct measuring up to 17 mm in size. Pancreas: Unremarkable. No pancreatic ductal dilatation or surrounding inflammatory changes. Spleen: Normal in size without focal abnormality. Adrenals/Urinary Tract: Adrenal glands are unremarkable. There is mild-to-moderate right hydronephrosis with a nondilated ureter which may reflect ureteropelvic junction obstruction. There is no hydronephrosis on the left. No renal calculi or focal renal lesion is identified on either side. Bladder is unremarkable. Stomach/Bowel: Stomach is within normal limits. No pericecal inflammatory changes are noted to suggest acute appendicitis. There is colonic diverticulosis without evidence of diverticulitis. No evidence of bowel wall thickening, distention, or  inflammatory changes. Vascular/Lymphatic: Aortic atherosclerosis. No enlarged abdominal or pelvic lymph nodes. Reproductive: Status post hysterectomy. No adnexal masses. Other: No abdominal wall hernia or abnormality. No abdominopelvic ascites. Anasarca is noted. Musculoskeletal: Degenerative changes are seen in the spine. There is 6 mm anterolisthesis of L4 on L5, not significantly changed since 12/05/2018. Review of the MIP images confirms the above findings. IMPRESSION: 1. No evidence of pulmonary embolism. 2. Large left and small right pleural effusions with associated atelectasis. 3. Mild scattered bilateral ground-glass opacities likely represent atelectasis and/or pneumonia. 4. Cardiomegaly with a moderate to large pericardial effusion. 5. Moderate intra and extrahepatic biliary ductal dilatation with the common bile duct measuring up to 17 mm in size. This may reflect post cholecystectomy state and patient age. Correlation with liver function tests is recommended. 6. Mild-to-moderate right hydronephrosis with a nondilated ureter which may reflect ureteropelvic junction obstruction. Aortic Atherosclerosis (ICD10-I70.0). Electronically Signed   By: Zerita Boers M.D.   On: 07/23/2020 18:08  CT ABDOMEN PELVIS W CONTRAST  Result Date: 07/22/2020 CLINICAL DATA:  Dementia patient with shortness of breath and abdominal pain. EXAM: CT ANGIOGRAPHY CHEST CT ABDOMEN AND PELVIS WITH CONTRAST TECHNIQUE: Multidetector CT imaging of the chest was performed using the standard protocol during bolus administration of intravenous contrast. Multiplanar CT image reconstructions and MIPs were obtained to evaluate the vascular anatomy. Multidetector CT imaging of the abdomen and pelvis was performed using the standard protocol during bolus administration of intravenous contrast. CONTRAST:  37mL OMNIPAQUE IOHEXOL 350 MG/ML SOLN COMPARISON:  Same day chest radiograph, CT chest dated 02/06/2020, CT abdomen pelvis dated  02/04/2019. FINDINGS: CTA CHEST FINDINGS Cardiovascular: Satisfactory opacification of the pulmonary arteries to the segmental level. No evidence of pulmonary embolism. Vascular calcifications are seen in the coronary arteries and aortic arch. There heart is enlarged and there is a moderate to large pericardial effusion. Mediastinum/Nodes: No enlarged mediastinal, hilar, or axillary lymph nodes. Aspirated debris is seen in the trachea (series 8 images 30 3-38). Thyroid gland and esophagus demonstrate no significant findings. Lungs/Pleura: There is a large left and a small right pleural effusion with associated atelectasis. There are mild scattered bilateral ground-glass opacities. There is no pneumothorax. Musculoskeletal: Degenerative changes are seen in the spine. Review of the MIP images confirms the above findings. CT ABDOMEN and PELVIS FINDINGS Hepatobiliary: No focal liver abnormality is seen. The patient is status post cholecystectomy. There is moderate intra and extrahepatic biliary ductal dilatation with the common bile duct measuring up to 17 mm in size. Pancreas: Unremarkable. No pancreatic ductal dilatation or surrounding inflammatory changes. Spleen: Normal in size without focal abnormality. Adrenals/Urinary Tract: Adrenal glands are unremarkable. There is mild-to-moderate right hydronephrosis with a nondilated ureter which may reflect ureteropelvic junction obstruction. There is no hydronephrosis on the left. No renal calculi or focal renal lesion is identified on either side. Bladder is unremarkable. Stomach/Bowel: Stomach is within normal limits. No pericecal inflammatory changes are noted to suggest acute appendicitis. There is colonic diverticulosis without evidence of diverticulitis. No evidence of bowel wall thickening, distention, or inflammatory changes. Vascular/Lymphatic: Aortic atherosclerosis. No enlarged abdominal or pelvic lymph nodes. Reproductive: Status post hysterectomy. No adnexal  masses. Other: No abdominal wall hernia or abnormality. No abdominopelvic ascites. Anasarca is noted. Musculoskeletal: Degenerative changes are seen in the spine. There is 6 mm anterolisthesis of L4 on L5, not significantly changed since 12/05/2018. Review of the MIP images confirms the above findings. IMPRESSION: 1. No evidence of pulmonary embolism. 2. Large left and small right pleural effusions with associated atelectasis. 3. Mild scattered bilateral ground-glass opacities likely represent atelectasis and/or pneumonia. 4. Cardiomegaly with a moderate to large pericardial effusion. 5. Moderate intra and extrahepatic biliary ductal dilatation with the common bile duct measuring up to 17 mm in size. This may reflect post cholecystectomy state and patient age. Correlation with liver function tests is recommended. 6. Mild-to-moderate right hydronephrosis with a nondilated ureter which may reflect ureteropelvic junction obstruction. Aortic Atherosclerosis (ICD10-I70.0). Electronically Signed   By: Zerita Boers M.D.   On: 08/09/2020 18:08   DG CHEST PORT 1 VIEW  Result Date: 07/21/2020 CLINICAL DATA:  Shortness of breath EXAM: PORTABLE CHEST 1 VIEW COMPARISON:  July 18, 2020 FINDINGS: There is cardiomegaly. There are persistent bilateral pleural effusions which have increased in size from the prior study. There are findings of congestive heart failure. There is no pneumothorax. Bibasilar airspace opacities are noted in favored to represent compressive atelectasis. There is a sclerotic lesion in the proximal left  humerus favored to represent a bone infarct or enchondroma. IMPRESSION: 1. Cardiomegaly with findings of congestive heart failure. 2. Increasing bilateral pleural effusions with adjacent compressive atelectasis. Electronically Signed   By: Constance Holster M.D.   On: 07/21/2020 20:58   DG Chest Port 1 View  Result Date: 07/29/2020 CLINICAL DATA:  Shortness of breath. EXAM: PORTABLE CHEST 1 VIEW  COMPARISON:  Radiograph 07/11/2020.  CT 02/06/2020 FINDINGS: Stable cardiomegaly. Unchanged mediastinal contours. Bilateral pleural effusions, not significantly changed on the right and increased on the left. Increased left basilar airspace disease typically atelectasis. Vascular congestion is similar. There is no pneumothorax. Stable osseous structures. Surgical clips in the right axilla. IMPRESSION: 1. Increased left pleural effusion with progressive left basilar airspace disease over the past week, typically atelectasis. 2. No significant change in right pleural effusion. 3. Stable cardiomegaly and vascular congestion. Electronically Signed   By: Keith Rake M.D.   On: 07/26/2020 16:03   ECHOCARDIOGRAM COMPLETE  Result Date: 07/19/2020    ECHOCARDIOGRAM REPORT   Patient Name:   DONYEL CASTAGNOLA Magnussen Date of Exam: 07/19/2020 Medical Rec #:  [id number]       Height:       65.0 in Accession #:    [id number]      Weight:       149.9 lb Date of Birth:  1941-05-17       BSA:          1.750 m Patient Age:    77 years        BP:           125/75 mmHg Patient Gender: F               HR:           107 bpm. Exam Location:  [facility] Procedure: 2D Echo Indications:    Pericardial effusion 423.9 / I31.3  History:        Patient has prior history of Echocardiogram examinations, most                 recent 12/17/2019. CHF, Pericardial Disease; Risk                 Factors:Hypertension, Dyslipidemia, Non-Smoker and Diabetes.                 Dementia, Bradycardia.  Sonographer:    Leavy Cella RDCS (AE) Referring Phys: [id number] OLADAPO ADEFESO IMPRESSIONS  1. Left ventricular ejection fraction, by estimation, is 50 to 55%. The left ventricle has low normal function. The left ventricle demonstrates regional wall motion abnormalities (see scoring diagram/findings for description). Left ventricular diastolic  parameters are indeterminate.  2. Right ventricular systolic function is low normal. The right ventricular size is normal.   3. Left atrial size was severely dilated.  4. A small pericardial effusion is present. The pericardial effusion is circumferential. There is no evidence of cardiac tamponade.  5. The mitral valve is normal in structure. Trivial mitral valve regurgitation. No evidence of mitral stenosis.  6. The aortic valve is tricuspid. Aortic valve regurgitation is not visualized. No aortic stenosis is present. FINDINGS  Left Ventricle: Left ventricular ejection fraction, by estimation, is 50 to 55%. The left ventricle has low normal function. The left ventricle demonstrates regional wall motion abnormalities. The left ventricular internal cavity size was normal in size. There is no left ventricular hypertrophy. Left ventricular diastolic parameters are indeterminate. Right Ventricle: The right ventricular size is normal. No increase in right ventricular  wall thickness. Right ventricular systolic function is low normal. Left Atrium: Left atrial size was severely dilated. Right Atrium: Right atrial size was normal in size. Pericardium: A small pericardial effusion is present. The pericardial effusion is circumferential. There is no evidence of cardiac tamponade. Mitral Valve: The mitral valve is normal in structure. Trivial mitral valve regurgitation. No evidence of mitral valve stenosis. Tricuspid Valve: The tricuspid valve is normal in structure. Tricuspid valve regurgitation is mild . No evidence of tricuspid stenosis. Aortic Valve: The aortic valve is tricuspid. Aortic valve regurgitation is not visualized. No aortic stenosis is present. Aortic valve mean gradient measures 4.1 mmHg. Aortic valve peak gradient measures 6.6 mmHg. Aortic valve area, by VTI measures 1.43 cm. Pulmonic Valve: The pulmonic valve was not well visualized. Pulmonic valve regurgitation is not visualized. No evidence of pulmonic stenosis. Aorta: The aortic root is normal in size and structure. Pulmonary Artery: 34.  Venous: The inferior vena cava was not  well visualized. IAS/Shunts: The interatrial septum was not well visualized.  LEFT VENTRICLE PLAX 2D LVIDd:         3.91 cm  Diastology LVIDs:         3.08 cm  LV e' medial:    5.22 cm/s LV PW:         0.90 cm  LV E/e' medial:  17.4 LV IVS:        0.88 cm  LV e' lateral:   5.15 cm/s LVOT diam:     1.90 cm  LV E/e' lateral: 17.6 LV SV:         45 LV SV Index:   26 LVOT Area:     2.84 cm  RIGHT VENTRICLE RV S prime:     6.18 cm/s TAPSE (M-mode): 1.6 cm LEFT ATRIUM             Index       RIGHT ATRIUM           Index LA diam:        4.40 cm 2.51 cm/m  RA Area:     15.80 cm LA Vol (A2C):   61.0 ml 34.86 ml/m RA Volume:   39.80 ml  22.74 ml/m LA Vol (A4C):   72.0 ml 41.14 ml/m LA Biplane Vol: 68.8 ml 39.31 ml/m  AORTIC VALVE AV Area (Vmax):    1.44 cm AV Area (Vmean):   1.40 cm AV Area (VTI):     1.43 cm AV Vmax:           128.76 cm/s AV Vmean:          95.477 cm/s AV VTI:            0.313 m AV Peak Grad:      6.6 mmHg AV Mean Grad:      4.1 mmHg LVOT Vmax:         65.55 cm/s LVOT Vmean:        47.156 cm/s LVOT VTI:          0.158 m LVOT/AV VTI ratio: 0.50  AORTA Ao Root diam: 2.60 cm MITRAL VALVE               TRICUSPID VALVE MV Area (PHT): 2.90 cm    TR Peak grad:   33.6 mmHg MV Decel Time: 262 msec    TR Vmax:        290.00 cm/s MV E velocity: 90.80 cm/s MV A velocity: 66.00 cm/s  SHUNTS MV E/A ratio:  1.38  Systemic VTI:  0.16 m                            Systemic Diam: 1.90 cm Carlyle Dolly MD Electronically signed by Carlyle Dolly MD Signature Date/Time: 07/19/2020/12:39:52 PM    Final     Assessment and Plan:   1.  Sinus node dysfunction with tachycardia-bradycardia syndrome, presenting ECGs show probable atypical atrial flutter, more recently in sinus bradycardia (she had previously shown posttermination pauses during prior hospitalization, Lopressor discontinued).  She has underlying conduction system disease, agree with discontinuation of Aricept and amiodarone, BuSpar was also  discontinued due to QT prolongation.  She did receive atropine yesterday per primary team, heart rate is in the mid 40s today.  QTc has improved.  2.  Recurrent pleural effusion, large on the left and small on the right.  Not able to tolerate diuresis at this time with low blood pressures and also worsening renal function, possibly reflection of ATN.  There has been some discussion about therapeutic thoracentesis.  3.  Dementia with DNR status.  4.  Pericardial effusion, reported to be moderate to large by chest CT, however echocardiogram reported small circumferential effusion.  5.  Acute renal failure, creatinine is increased from 0.7 up to 1.8.  Likely associated with ATN with recent bradycardia and hypotension.  6.  Hypothermia.  Recently treated for UTI and completed course of Rocephin per primary.  Repeat blood cultures sent, also TSH.  She has a warming blanket.  I reviewed the chart and discussed the case in detail with Dr. Denton Brick.  I agree with discontinuation of Aricept, BuSpar, and amiodarone.  If bradycardia persists, in particular if this worsens and she requires repeat treatments with atropine, question remains of the whether she would be a candidate for pacemaker.  Amiodarone was just recently discontinued, and this will take a while to get out of her system however.  With her advancing dementia and what looks to be limited quality of life, not certain how aggressive EP team would be in this case, and it would certainly require further discussion with family members.  I do think that a therapeutic left-sided thoracentesis may make her more comfortable and breathe more easily, would not aggressively diurese however in light of recent blood pressures and worsening renal function.  Would also recommend a follow-up limited echocardiogram to ensure that pericardial effusion is not increasing compared to the last study. Overall she looks to be declining in terms of her health, and having a  realistic palliative care discussion would make sense.   Signed, Rozann Lesches, MD  07/22/2020 9:50 AM

## 2020-07-23 ENCOUNTER — Other Ambulatory Visit: Payer: Self-pay | Admitting: Adult Health

## 2020-07-23 DIAGNOSIS — I495 Sick sinus syndrome: Secondary | ICD-10-CM | POA: Diagnosis not present

## 2020-07-23 DIAGNOSIS — Z7189 Other specified counseling: Secondary | ICD-10-CM | POA: Diagnosis not present

## 2020-07-23 DIAGNOSIS — J9601 Acute respiratory failure with hypoxia: Secondary | ICD-10-CM | POA: Diagnosis not present

## 2020-07-23 DIAGNOSIS — Z515 Encounter for palliative care: Secondary | ICD-10-CM | POA: Diagnosis not present

## 2020-07-23 DIAGNOSIS — L8990 Pressure ulcer of unspecified site, unspecified stage: Secondary | ICD-10-CM | POA: Insufficient documentation

## 2020-07-23 LAB — CBC
HCT: 33.2 % — ABNORMAL LOW (ref 36.0–46.0)
Hemoglobin: 8.9 g/dL — ABNORMAL LOW (ref 12.0–15.0)
MCH: 25 pg — ABNORMAL LOW (ref 26.0–34.0)
MCHC: 26.8 g/dL — ABNORMAL LOW (ref 30.0–36.0)
MCV: 93.3 fL (ref 80.0–100.0)
Platelets: 200 10*3/uL (ref 150–400)
RBC: 3.56 MIL/uL — ABNORMAL LOW (ref 3.87–5.11)
RDW: 16.6 % — ABNORMAL HIGH (ref 11.5–15.5)
WBC: 13 10*3/uL — ABNORMAL HIGH (ref 4.0–10.5)
nRBC: 0 % (ref 0.0–0.2)

## 2020-07-23 LAB — BASIC METABOLIC PANEL
Anion gap: 13 (ref 5–15)
BUN: 43 mg/dL — ABNORMAL HIGH (ref 8–23)
CO2: 25 mmol/L (ref 22–32)
Calcium: 8.3 mg/dL — ABNORMAL LOW (ref 8.9–10.3)
Chloride: 108 mmol/L (ref 98–111)
Creatinine, Ser: 2.28 mg/dL — ABNORMAL HIGH (ref 0.44–1.00)
GFR, Estimated: 21 mL/min — ABNORMAL LOW (ref >60)
Glucose, Bld: 168 mg/dL — ABNORMAL HIGH (ref 70–99)
Potassium: 5.5 mmol/L — ABNORMAL HIGH (ref 3.5–5.1)
Sodium: 146 mmol/L — ABNORMAL HIGH (ref 135–145)

## 2020-07-23 LAB — GLUCOSE, CAPILLARY
Glucose-Capillary: 119 mg/dL — ABNORMAL HIGH (ref 70–99)
Glucose-Capillary: 139 mg/dL — ABNORMAL HIGH (ref 70–99)
Glucose-Capillary: 154 mg/dL — ABNORMAL HIGH (ref 70–99)
Glucose-Capillary: 157 mg/dL — ABNORMAL HIGH (ref 70–99)
Glucose-Capillary: 170 mg/dL — ABNORMAL HIGH (ref 70–99)
Glucose-Capillary: 173 mg/dL — ABNORMAL HIGH (ref 70–99)

## 2020-07-23 MED FILL — Insulin Aspart Inj 100 Unit/ML: SUBCUTANEOUS | Qty: 0.02 | Status: AC

## 2020-07-23 NOTE — Progress Notes (Signed)
Daughter in law at bedside. Updated on pt's current condition and no po intake.

## 2020-07-23 NOTE — Progress Notes (Signed)
Palliative: Amy Hoffman is lying quietly in bed.  She appears acutely/chronically ill and quite frail.  I asked several time and she did open her eyes, but did not make eye contact. She is unable to take water, unable to make her basic needs known.  There is no family at bedside at this time.  Conference with bedside nursing staff and attending.  Call to daughter, Amy Hoffman.  Left somewhat detailed voicemail message.  Return call from Amy Hoffman, but I am unable to accept as I am on another lab with another family. Call to [place], and we talk about Amy Hoffman's health, and decline since April of this year after PNE. Amy Hoffman states that she would like for her mother to come home, possibly with hospice.  We talk about what I and is not provided at hospice.  Amy Hoffman tells a story of decline of the last month in particular. I share that Amy Hoffman needs total care.   Amy Hoffman and I talk about residential hospice services, what is and is not provided, in detail.  Amy Hoffman states that she would like to make sure her mother is comfortable.  I share that bear hugger is not provided at hospice, but she would have warm blankets if wanted, medicines for pain, anxiety, but no IV fluids.      Amy Hoffman states that she would like for me to share with Amy Hoffman's husband, Amy Hoffman, about at home hospice services and residential hospice services.  She states that they will have a family meeting, later tonight to discuss options.  Amy Hoffman is agreeable to hospice hospice. We talked about at home hospice services.  Amy Hoffman states that he would really like for his wife to come home, she stated that she wanted to come home.  We talked about the realities of her, total care.  Amy Hoffman states that he know someone that does this kind of work and feels that he can get help.  I share with him that if she needs more care than he can provide once she is home, he can then transition to residential hospice.   Conference with attending,  bedside nursing staff, transition of care team related to patient condition, needs, goals of care, request for hospice type care.  Plan: At this point continue to treat the treatable but no CPR or intubation.  Amy Hoffman states that she would like hospice type care, but is unsure if they would provide this care at home or at residential hospice in [place].  We talked about comfort and dignity at end-of-life, let nature take its course.          84 minutes [name], NP Palliative Medicine Team Team Phone # [phone] Greater than 50% of this time was spent counseling and coordinating care related to the above assessment and plan.

## 2020-07-23 NOTE — Progress Notes (Signed)
Progress Note  Patient Name: Amy Hoffman Date of Encounter: 07/23/2020  Primary Cardiologist: Carlyle Dolly, MD  Subjective   Patient not interactive to questions regarding symptoms, does moan periodically.  Has not been eating despite assistance.  Inpatient Medications    Scheduled Meds: . apixaban  2.5 mg Oral BID  . Chlorhexidine Gluconate Cloth  6 each Topical Daily  . insulin aspart  0-15 Units Subcutaneous Q4H  . nystatin  5 mL Oral QID  . simvastatin  20 mg Oral Daily  . tamsulosin  0.4 mg Oral QPC supper   Continuous Infusions: . dextrose 5 % and 0.45% NaCl 40 mL/hr at 07/22/20 2000   PRN Meds: acetaminophen, Resource ThickenUp Clear   Vital Signs    Vitals:   07/22/20 1404 07/22/20 1653 07/22/20 2111 07/23/20 0500  BP: (!) 101/38 (!) 111/41 109/61   Pulse: (!) 44 (!) 50 (!) 102   Resp: 17 18 20    Temp: (!) 96.3 F (35.7 C) 97.8 F (36.6 C) 98.3 F (36.8 C)   TempSrc: Rectal Rectal Oral   SpO2: 97% 96% (!) 88%   Weight:    59 kg  Height:        Intake/Output Summary (Last 24 hours) at 07/23/2020 1002 Last data filed at 07/23/2020 0542 Gross per 24 hour  Intake 360 ml  Output 375 ml  Net -15 ml   Filed Weights   07/21/20 0500 07/22/20 0500 07/23/20 0500  Weight: 61.1 kg 61 kg 59 kg    Telemetry    Sinus bradycardia, heart rate currently in the 50s.  No pauses.  Personally reviewed.  ECG    No tracing reviewed today.  Physical Exam   GEN:  Frail-appearing elderly woman, no acute distress. Neck:  Elevated JVP. Cardiac: RRR, soft systolic murmur, no pericardial rub.  Respiratory:  Decreased breath sounds more prominent on the left to mid lung zone, no wheezing. GI: Soft, nontender, bowel sounds present. MS: No pitting edema. Neuro:  Nonfocal. Psych: Awake, but unable to assess to what degree she is oriented.  Labs    Chemistry Recent Labs  Lab 08/12/2020 1500 08/08/2020 1500 07/19/20 0351 07/21/20 0543 07/21/20 2344  07/22/20 0629 07/23/20 0530  NA 142   < > 145   < > 143 144 146*  K 4.6   < > 4.8   < > 5.1 5.0 5.5*  CL 107   < > 108   < > 110 107 108  CO2 30   < > 29   < > 25 28 25   GLUCOSE 183*   < > 167*   < > 137* 155* 168*  BUN 22   < > 18   < > 30* 34* 43*  CREATININE 0.80   < > 0.70   < > 1.69* 1.80* 2.28*  CALCIUM 8.8*   < > 8.9   < > 8.2* 8.5* 8.3*  PROT 6.4*  --  6.1*  --   --   --   --   ALBUMIN 2.9*  --  2.7*  --   --   --   --   AST 22  --  20  --   --   --   --   ALT 35  --  33  --   --   --   --   ALKPHOS 106  --  95  --   --   --   --   BILITOT 0.9  --  0.7  --   --   --   --   GFRNONAA >60   < > >60   < > 30* 28* 21*  ANIONGAP 5   < > 8   < > 8 9 13    < > = values in this interval not displayed.     Hematology Recent Labs  Lab 07/21/20 0543 07/22/20 0629 07/23/20 0530  WBC 11.5* 8.9 13.0*  RBC 3.32* 3.38* 3.56*  HGB 8.6* 8.7* 8.9*  HCT 31.0* 32.4* 33.2*  MCV 93.4 95.9 93.3  MCH 25.9* 25.7* 25.0*  MCHC 27.7* 26.9* 26.8*  RDW 16.4* 16.5* 16.6*  PLT 220 176 200    Cardiac Enzymes Recent Labs  Lab 06/30/20 1441 06/30/20 1636 07/21/20 2344 07/22/20 0629  TROPONINIHS 14 14 11 11     BNP Recent Labs  Lab 07/21/2020 1500  BNP 375.0*     Radiology    DG Chest 1 View  Result Date: 07/22/2020 CLINICAL DATA:  Pleural effusion, status post left thoracentesis EXAM: CHEST  1 VIEW COMPARISON:  07/22/2020 at 10:50 a.m. FINDINGS: Prominent reduction in the size of the left pleural effusion compared to earlier exam, with only mild residual blunting of left costophrenic angle. No significant pneumothorax is identified. Persistent moderate right pleural effusion. Moderate enlargement of the cardiopericardial silhouette noted. Atherosclerotic calcification of the aortic arch. 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 IMPRESSION: 1. Prominent reduction in the size of the left pleural effusion, with only mild residual blunting of the left costophrenic angle. No  pneumothorax. 2. Persistent moderate right pleural effusion. 3. Moderate enlargement of the cardiopericardial silhouette. Electronically Signed   By: Van Clines M.D.   On: 07/22/2020 14:05   DG Chest Port 1 View  Result Date: 07/22/2020 CLINICAL DATA:  Shortness of breath EXAM: PORTABLE CHEST 1 VIEW COMPARISON:  07/21/2020 FINDINGS: Cardiac shadow is stable. Bilateral pleural effusions are again identified. Central vascular congestion is again seen. Right basilar consolidation is noted stable from the prior exam. Stable calcifications in the proximal left humerus. No other bony abnormality is noted. IMPRESSION: Persistent bilateral pleural effusions and vascular congestion. Consolidation in the right base is again noted. Electronically Signed   By: Inez Catalina M.D.   On: 07/22/2020 11:10   DG CHEST PORT 1 VIEW  Result Date: 07/21/2020 CLINICAL DATA:  Shortness of breath EXAM: PORTABLE CHEST 1 VIEW COMPARISON:  July 18, 2020 FINDINGS: There is cardiomegaly. There are persistent bilateral pleural effusions which have increased in size from the prior study. There are findings of congestive heart failure. There is no pneumothorax. Bibasilar airspace opacities are noted in favored to represent compressive atelectasis. There is a sclerotic lesion in the proximal left humerus favored to represent a bone infarct or enchondroma. IMPRESSION: 1. Cardiomegaly with findings of congestive heart failure. 2. Increasing bilateral pleural effusions with adjacent compressive atelectasis. Electronically Signed   By: Constance Holster M.D.   On: 07/21/2020 20:58   ECHOCARDIOGRAM LIMITED  Result Date: 07/22/2020    ECHOCARDIOGRAM LIMITED REPORT   Patient Name:   Amy Hoffman Date of Exam: 07/22/2020 Medical Rec #:  [id number]       Height:       65.0 in Accession #:    [id number]      Weight:       134.5 lb Date of Birth:  02/04/1941       BSA:          1.671 m Patient Age:  79 years        BP:            100/77 mmHg Patient Gender: F               HR:           47 bpm. Exam Location:  [facility] Procedure: Limited Echo                                 MODIFIED REPORT:  This report was modified by Rozann Lesches MD on 07/22/2020 due to Additional                                   information.  Indications:     Pericardial effusion 423.9 / I31.3  History:         Patient has prior history of Echocardiogram examinations, most                  recent 07/19/2020. CHF; Risk Factors:Hypertension, Dyslipidemia,                  Non-Smoker and Diabetes. Dysphagia, Bradycardia, Breast Cancer.  Sonographer:     Leavy Cella RDCS (AE) Referring Phys:  [id number] COURAGE EMOKPAE Diagnosing Phys: Rozann Lesches MD IMPRESSIONS  1. Limited study for re-evaluation of pericardial effusion.  2. Left ventricular ejection fraction, by estimation, is 60 to 65%. The left ventricle has normal function. The left ventricle has no regional wall motion abnormalities.  3. Right ventricular systolic function is normal. The right ventricular size is normal. There is mildly elevated pulmonary artery systolic pressure. The estimated right ventricular systolic pressure is 78.2 mmHg.  4. A small pericardial effusion is present. The pericardial effusion is circumferential. Large pleural effusion in the left lateral region.  5. The mitral valve is grossly normal. Trivial mitral valve regurgitation.  6. Tricuspid valve regurgitation is mild to moderate.  7. The aortic valve is tricuspid. There is mild thickening of the aortic valve.  8. The inferior vena cava is normal in size with greater than 50% respiratory variability, suggesting right atrial pressure of 3 mmHg. FINDINGS  Left Ventricle: Left ventricular ejection fraction, by estimation, is 60 to 65%. The left ventricle has normal function. The left ventricle has no regional wall motion abnormalities. The left ventricular internal cavity size was normal in size. There is  borderline left ventricular  hypertrophy. Right Ventricle: The right ventricular size is normal. No increase in right ventricular wall thickness. Right ventricular systolic function is normal. There is mildly elevated pulmonary artery systolic pressure. The tricuspid regurgitant velocity is 2.90  m/s, and with an assumed right atrial pressure of 3 mmHg, the estimated right ventricular systolic pressure is 95.6 mmHg. Right Atrium: Prominent Chiari network. Pericardium: A small pericardial effusion is present. The pericardial effusion is circumferential. Mitral Valve: The mitral valve is grossly normal. Trivial mitral valve regurgitation. Tricuspid Valve: The tricuspid valve is grossly normal. Tricuspid valve regurgitation is mild to moderate. Aortic Valve: The aortic valve is tricuspid. There is mild thickening of the aortic valve. There is mild aortic valve annular calcification. Aorta: The aortic root is normal in size and structure. Venous: The inferior vena cava is normal in size with greater than 50% respiratory variability, suggesting right atrial pressure of 3 mmHg. Additional Comments: There is a large pleural effusion in the  left lateral region. LEFT VENTRICLE PLAX 2D LVIDd:         4.31 cm LVIDs:         2.34 cm LV PW:         1.53 cm LV IVS:        0.87 cm  LEFT ATRIUM         Index LA diam:    4.50 cm 2.69 cm/m   AORTA Ao Root diam: 2.60 cm MITRAL VALVE                TRICUSPID VALVE MV Area (PHT): 2.74 cm     TR Peak grad:   33.6 mmHg MV Decel Time: 277 msec     TR Vmax:        290.00 cm/s MV E velocity: 116.00 cm/s MV A velocity: 48.40 cm/s MV E/A ratio:  2.40 Rozann Lesches MD Electronically signed by Rozann Lesches MD Signature Date/Time: 07/22/2020/12:23:10 PM    Final (Updated)    US THORACENTESIS ASP PLEURAL SPACE W/IMG GUIDE  Result Date: 07/22/2020 INDICATION: Left pleural effusion EXAM: ULTRASOUND GUIDED LEFT THORACENTESIS MEDICATIONS: None. COMPLICATIONS: None immediate. PROCEDURE: The patient has dementia and is  not able to consent for herself. I spoke to the patient's daughter by telephone. We discussed the reason for today's thoracentesis as well as the risks (including hemorrhage, infection, and pneumothorax), benefits, and alternatives to ultrasound-guided thoracentesis. We discussed the high likelihood of success of the procedure. The patient's daughter understood and elected for the patient undergo the procedure. Standard time-out was employed. Following sterile skin prep and local anesthetic administration consisting of 1% lidocaine, and following ultrasound localization, a Yueh catheter was advanced without difficulty into the left pleural fluid. Amber fluid was returned, and the needle removed with the catheter left in place. A total of 1100 cc of amber fluid was removed. The catheter was subsequently removed in the skin cleansed and bandaged. No immediate complication was observed. Following the procedure, a chest radiograph demonstrated no significant pneumothorax and marked reduction of the left pleural effusion. FINDINGS: A total of approximately 1100 cc of amber fluid was removed. IMPRESSION: Successful ultrasound guided left thoracentesis yielding 1100 cc of pleural fluid. Electronically Signed   By: Van Clines M.D.   On: 07/22/2020 14:02   Patient Profile     79 y.o. 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  Assessment & Plan    1.  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  She is currently in sinus bradycardia with heart rate in the 50s.  Blood pressures have stabilized with systolics in the 195-093 range.  She is not on any AV nodal blockers, amiodarone discontinued as well.  Also off of Aricept.  2.   Recurrent pleural effusions, large on the left and small to moderate on the right.  She underwent therapeutic thoracentesis yesterday with removal of 1.1 L.  She has not been on diuretics with recent worsening renal function and borderline hemodynamics.  3.  Paroxysmal to persistent atrial fibrillation/atypical atrial flutter with history of tachycardia-bradycardia syndrome.  She is on Eliquis for stroke prophylaxis with CHA2DS2-VASc score of 6.  Currently in sinus bradycardia.  4.  Dementia with progressive functional decline.  Aricept discontinued with symptomatic bradycardia and sinus node dysfunction, also taken off BuSpar due to  QT prolongation.  She has DNR status, palliative care team consultation was obtained yesterday.  Primary team continues to work with family on a regular basis regarding realistic goals of care.  5.  Acute renal failure, creatinine has climbed to 2.28.  Potential contribution of ATN with recent hypotension.  She is not on diuretics.  No clear indication to pursue pacemaker at this time, and as mentioned previously given progressive decline with dementia and poor functional status, it is unlikely that pacemaker alone would improve her prognosis.  Would hold off on diuretics given acute renal failure.  Would continue to work with palliative care team to discuss realistic goals of care with family.  She has a poor prognosis with declining status and it would seem most appropriate to keep her comfortable as she reaches the end of her life.  Signed, Rozann Lesches, MD  07/23/2020, 10:02 AM

## 2020-07-23 NOTE — Progress Notes (Signed)
Patient Demographics:    Amy Hoffman, is a 79 y.o. female, DOB - [birth year], PFX:[id number]  Admit date - 08/03/2020   Admitting Physician Bernadette Hoit, DO  Outpatient Primary MD for the patient is [name], NP  LOS - 5   Chief Complaint  Patient presents with  . Shortness of Breath    Patient complaining of SHOB and edema to BLE for the past two days. Recently discharged with new DX of CHF. Did not obtain RX for diuretic at discharge. Did not follow-up for this on Monday as planned. Reports weeping to BLE.         Subjective:    Amy Hoffman today has no fevers, no emesis,  No chest pain,    07/23/20 -Remains very lethargic, not taking significant oral intake at all, -Not really interactive,, moaning and shouting help me occasionally not as frequent and normal -Overall less responsive, bradycardia and hypoxia persist  Assessment  & Plan :    Principal Problem:   Acute respiratory failure with hypoxia (HCC) Active Problems:   Essential hypertension   Palliative care by specialist   CHF (congestive heart failure) (HCC)   Hyperlipidemia associated with type 2 diabetes mellitus (HCC)   Pleural effusion   Hypoalbuminemia   Prolonged QT interval   Pericardial effusion   Hydronephrosis   Hyperglycemia due to diabetes mellitus (HCC)   Elevated brain natriuretic peptide (BNP) level   Atelectasis of left lung   Goals of care, counseling/discussion  Brief Summary:-  79 y.o. female with medical history significant for atrial fibrillation, dementia, chronic diastolic CHF, DM2, abnormal uterine bleeding, allergic rhinitis, anxiety, depression, dementia, right breast cancer, HTN, HLD, obesity, GERD and osteoarthritis admitted on 07/17/2020 with fatigue and worsening shortness of breath with oxygen requirement in the setting of diastolic dysfunction congestive heart failure exacerbation with  recurrent left-sided pleural effusion   A/p 1) acute on chronic hypoxic respiratory failure--as per patient's daughter patient likely used oxygen on and off previously -Currently requiring 3 L of oxygen via nasal cannula in the setting of recurrent left-sided pleural effusion and diastolic dysfunction CHF -Procalcitonin is less than 0.10 patient is afebrile, and without leukocytosis, doubt pneumonia-- --Patient's healthcare power of attorney/daughter reluctant to try Lasix at this time -Status post therapeutic Lt thoracentesis on 07/22/20 with removal of 1.1 L -Cardiorespiratory status has declined and not improving despite interventions including thoracentesis  2)HFpEF-patient with acute on chronic diastolic dysfunction CHF with recurrent left-sided pleural effusion, had  thoracentesis was 07/11/2020 with 630 mils of fluid removed-please see #1 above - -Initially there was concern for possible pericardial effusion due to CT chest finding, however echocardiogram on 07/19/2020 and repeat limited echo on 07/22/2020 without significant pericardial effusion -Echo does show EF of 50 to 55% with regional wall motion normalities, along with severely dilated left atrium and small pericardial effusion -Per cardiology service no further intervention or work-up needed with regards to the small pericardial effusion -Cardiology consult from Dr. Domenic Polite noted and appreciated -Patient has limited treatment options--BP to soft for diuretics   3) recurrent UTI--in the setting of hydronephrosis-CT abdomen and pelvis showed mild to moderate right hydronephrosis with a nondilated ureter which may reflect ureteropelvic junction obstruction -There was suspicion of recurrent UTI this  admission -patient has Klebsiella UTI few weeks ago -Urine culture from 08/02/2020 without growth, probably contaminated sample, -Patient has empirically been treated with IV Rocephin for at least 3 days , last dose of Rocephin on  07/21/2020 -Continue Flomax and consider outpatient follow-up with urology  4) chronic atrial fibrillation/atrial flutter and history of tachybradycardia syndrome/sick sinus syndrome Echo as above #2, with severely dilated left atrium -CHA2DS2-VASc: 6 -Continue Eliquis  -EKG on 07/17/2020 with a QTC of 588, after stopping BuSpar, and previously stopping Aricept QTC appears to have improved - repeat EKG on 07/21/20 with a QTC of 430,  -Given persistent bradycardia per cardiology service okay to discontinue amiodarone, continue to avoid QT prolonging agents -TSH WNL -Treatment options are limited at this time given patient's poor functional status and overall declining health  5)DM2-A1c 7.7 reflecting uncontrolled DM with hyperglycemia -Use Novolog/Humalog Sliding scale insulin with Accu-Cheks/Fingersticks as ordered   6)HTN--hold amlodipine due to soft BP  7)Dementia with behavioral disturbance -Use Ativan as needed for agitation. --EKG on 08/13/2020 with a QTC of 588, after stopping BuSpar, and previously stopping Aricept QTC appears to have improved -Will not restart Aricept or BuSpar due to concerns about QT prolongation  8)Social/Ethics--extensive conversation with patient's daughter and POA about current condition, plan of care and goals of care -Patient is a DNR/DNI without any limitations to treatment at this time -She remains full scope of treatment -Apparently advanced home health previously recommended to patient's daughter and family that they should consider long-term placement and possibly palliative care/hospice involvement -Official palliative care consult appreciated -Overall prognosis is very poor, patient continues to decline  -Patient has developed persistent bradycardia, persistent hypoxia, soft blood pressures and persistent hypothermia requiring bear hugger --Cardiorespiratory status has declined and not improving despite interventions including thoracentesis, renal  function is worsened -I have had conversations with patient's Oden ([name]), patient's husband and patient's son [name] -about patient's overall poor prognosis -Awaiting family's decision on possible transition to comfort care and hospice  9)AKI----acute kidney injury with hyperkalemia--most likely due to a persistent hypotension/soft BP, poor oral intake     creatinine on admission= 0.8,  baseline creatinine = 0.7-0.8    ,  creatinine is now= 2.28, --Consider Kayexalate enema as patient cannot take oral intake if hyperkalemia persists -- renally adjust medications, avoid nephrotoxic agents / dehydration  / hypotension -Patient with fluid/volume overload, and diastolic CHF exacerbation making it challenging to adequately hydrate her  10)Hypothermia----blood cultures pending recent urine culture was negative patient was recently empirically treated for possible UTI for her Rocephin but cultures came back negative -TSH WNL -Suspect hypotension due to poor perfusion overall  11)FEN/hypernatremia--due to dehydration and free water deficit, - --very very poor oral intake due to lethargy, give dextrose gently IV--concerns for volume overload//follow-up diastolic CHF worsening -Check CBGs to prevent significant hypoglycemia -- patient refuses oral intake even from family members  12)Lt Heel pressure ulcer/wounds-- POA--please see photos in epic, wound care consult appreciated--- Pressure Injury 07/19/20 Heel Left Unstageable - Full thickness tissue loss in which the base of the injury is covered by slough (yellow, tan, gray, green or brown) and/or eschar (tan, brown or black) in the wound bed. (Active)  Date First Assessed/Time First Assessed: 07/19/20 1700   Location: Heel  Location Orientation: Left  Staging: Unstageable - Full thickness tissue loss in which the base of the injury is covered by slough (yellow, tan, gray, green or brown) and/or esch...    Assessments 07/19/2020  5:59 PM 07/22/2020  7:45 PM  Dressing Type Foam - Lift dressing to assess site every shift Gauze (Comment);Non adherent  Dressing Changed Clean;Dry;Intact  Dressing Change Frequency -- Daily  Site / Wound Assessment -- Dressing in place / Unable to assess  Drainage Amount -- None  Treatment -- Off loading     No Linked orders to display     Pressure Injury 07/20/20 Heel Left Stage 3 -  Full thickness tissue loss. Subcutaneous fat may be visible but bone, tendon or muscle are NOT exposed. (Active)  Date First Assessed/Time First Assessed: 07/20/20 1011   Location: Heel  Location Orientation: Left  Staging: Stage 3 -  Full thickness tissue loss. Subcutaneous fat may be visible but bone, tendon or muscle are NOT exposed.  Present on Admission: Yes    Assessments 07/20/2020  5:30 PM 07/22/2020  7:45 PM  Dressing Type -- Gauze (Comment);Non adherent  Dressing -- Clean;Dry;Intact  Dressing Change Frequency -- Daily  Site / Wound Assessment -- Dressing in place / Unable to assess  Wound Length (cm) 1 cm --  Wound Width (cm) 0.5 cm --  Wound Surface Area (cm^2) 0.5 cm^2 --  Drainage Amount -- None  Treatment -- Off loading     No Linked orders to display     Disposition/Need for in-Hospital Stay- patient unable to be discharged at this time due to acute hypoxic respiratory failure secondary to diastolic CHF exacerbation with pleural effusion requiring thoracentesis  Status is: Inpatient  Remains inpatient appropriate because:acute hypoxic respiratory failure secondary to CHF exacerbation with pleural effusion requiring thoracentesis  Disposition: The patient is from: Home              Anticipated d/c is to: Home with [place]              Anticipated d/c date is: 2 days              Patient currently is not medically stable to d/c. Barriers: Not Clinically Stable- acute hypoxic respiratory failure secondary to CHF exacerbation with pleural effusion requiring thoracentesis  Code Status  : DNR  Family Communication:   discussed with  daughter [name] by phone previously and again Left voicemail for patient's daughter/POA on 07/23/2020 -Discussed with patient's son at bedside on 07/22/2020  Consults  :  na  DVT Prophylaxis  : Apixaban  Lab Results  Component Value Date   PLT 200 07/23/2020    Inpatient Medications  Scheduled Meds: . apixaban  2.5 mg Oral BID  . Chlorhexidine Gluconate Cloth  6 each Topical Daily  . insulin aspart  0-15 Units Subcutaneous Q4H  . nystatin  5 mL Oral QID  . simvastatin  20 mg Oral Daily  . tamsulosin  0.4 mg Oral QPC supper   Continuous Infusions: . dextrose 5 % and 0.45% NaCl 40 mL/hr at 07/22/20 2000   PRN Meds:.   Anti-infectives (From admission, onward)   Start     Dose/Rate Route Frequency Ordered Stop   07/19/20 1600  cefTRIAXone (ROCEPHIN) 1 g in sodium chloride 0.9 % 100 mL IVPB        1 g 200 mL/hr over 30 Minutes Intravenous Every 24 hours 07/19/20 1536 07/21/20 1747        Objective:   Vitals:   07/22/20 1653 07/22/20 2111 07/23/20 0500 07/23/20 1514  BP: (!) 111/41 109/61  (!) 114/58  Pulse: (!) 50 (!) 102  (!) 54  Resp: 18 20  (!)  22  Temp: 97.8 F (36.6 C) 98.3 F (36.8 C)  98.2 F (36.8 C)  TempSrc: Rectal Oral  Axillary  SpO2: 96% (!) 88%  (!) 89%  Weight:   59 kg   Height:        Wt Readings from Last 3 Encounters:  07/23/20 59 kg  07/10/20 68 kg  07/09/20 60.3 kg     Intake/Output Summary (Last 24 hours) at 07/23/2020 1544 Last data filed at 07/23/2020 0542 Gross per 24 hour  Intake 360 ml  Output 375 ml  Net -15 ml    Physical Exam  Gen:-Lethargic,,  In no apparent distress  HEENT:- [place].AT, No sclera icterus Nose- [place] 3L/min Neck-Supple Neck,No JVD,.  Lungs-diminished breath sounds, faint bibasilar rales,  CV- S1, S2 normal, irregular  Abd-  +ve B.Sounds, Abd Soft, No tenderness,    Extremity/Skin:-  pedal pulses present  Psych---patient with cognitive and memory  deficits,, episodes of disorientation and confusion  Neuro-generalized weakness, no new focal deficits, no tremors Msk:-Lt Heel pressure ulcer/wounds, necrotic eschar noted-please see photos in epic   Data Review:   Micro Results Recent Results (from the past 240 hour(s))  Urine Culture     Status: None   Collection Time: 08/07/2020 11:30 AM  Result Value Ref Range Status   Source: URINE  Final   Status: FINAL  Final   Result:   Final    Growth of mixed flora was isolated, suggesting probable contamination. No further testing will be performed. If clinically indicated, recollection using a method to minimize contamination, with prompt transfer to Urine Culture Transport Tube, is  recommended.   Resp Panel by RT PCR (RSV, Flu A&B, Covid) - Nasopharyngeal Swab     Status: None   Collection Time: 08/03/2020  3:25 PM   Specimen: Nasopharyngeal Swab  Result Value Ref Range Status   SARS Coronavirus 2 by RT PCR NEGATIVE NEGATIVE Final    Comment: (NOTE) SARS-CoV-2 target nucleic acids are NOT DETECTED.  The SARS-CoV-2 RNA is generally detectable in upper respiratoy specimens during the acute phase of infection. The lowest concentration of SARS-CoV-2 viral copies this assay can detect is 131 copies/mL. A negative result does not preclude SARS-Cov-2 infection and should not be used as the sole basis for treatment or other patient management decisions. A negative result may occur with  improper specimen collection/handling, submission of specimen other than nasopharyngeal swab, presence of viral mutation(s) within the areas targeted by this assay, and inadequate number of viral copies (<131 copies/mL). A negative result must be combined with clinical observations, patient history, and epidemiological information. The expected result is Negative.  Fact Sheet for Patients:  PinkCheek.be  Fact Sheet for Healthcare Providers:   GravelBags.it  This test is no t yet approved or cleared by the [place] FDA and  has been authorized for detection and/or diagnosis of SARS-CoV-2 by FDA under an Emergency Use Authorization (EUA). This EUA will remain  in effect (meaning this test can be used) for the duration of the COVID-19 declaration under Section 564(b)(1) of the Act, 21 U.S.C. section 360bbb-3(b)(1), unless the authorization is terminated or revoked sooner.     Influenza A by PCR NEGATIVE NEGATIVE Final   Influenza B by PCR NEGATIVE NEGATIVE Final    Comment: (NOTE) The Xpert Xpress SARS-CoV-2/FLU/RSV assay is intended as an aid in  the diagnosis of influenza from Nasopharyngeal swab specimens and  should not be used as a sole basis for treatment. Nasal washings and  aspirates are unacceptable for Xpert Xpress SARS-CoV-2/FLU/RSV  testing.  Fact Sheet for Patients: PinkCheek.be  Fact Sheet for Healthcare Providers: GravelBags.it  This test is not yet approved or cleared by the [place] FDA and  has been authorized for detection and/or diagnosis of SARS-CoV-2 by  FDA under an Emergency Use Authorization (EUA). This EUA will remain  in effect (meaning this test can be used) for the duration of the  Covid-19 declaration under Section 564(b)(1) of the Act, 21  U.S.C. section 360bbb-3(b)(1), unless the authorization is  terminated or revoked.    Respiratory Syncytial Virus by PCR NEGATIVE NEGATIVE Final    Comment: (NOTE) Fact Sheet for Patients: PinkCheek.be  Fact Sheet for Healthcare Providers: GravelBags.it  This test is not yet approved or cleared by the [place] FDA and  has been authorized for detection and/or diagnosis of SARS-CoV-2 by  FDA under an Emergency Use Authorization (EUA). This EUA will remain  in effect (meaning this test can be  used) for the duration of the  COVID-19 declaration under Section 564(b)(1) of the Act, 21 U.S.C.  section 360bbb-3(b)(1), unless the authorization is terminated or  revoked. Performed at [facility], [address] [place], [place] [address]   Culture, blood (Routine X 2) w Reflex to ID Panel     Status: None (Preliminary result)   Collection Time: 07/22/20  6:46 AM   Specimen: BLOOD  Result Value Ref Range Status   Specimen Description BLOOD BLOOD LEFT ARM  Final   Special Requests   Final    BOTTLES DRAWN AEROBIC AND ANAEROBIC Blood Culture adequate volume   Culture  Setup Time NO ORGANISMS SEEN ANAEROBIC BOTTLE ONLY  Final   Culture   Final    NO GROWTH < 24 HOURS Performed at [facility], [address] [place], [place] [address]    Report Status PENDING  Incomplete  Culture, blood (Routine X 2) w Reflex to ID Panel     Status: None (Preliminary result)   Collection Time: 07/22/20  7:04 AM   Specimen: BLOOD  Result Value Ref Range Status   Specimen Description BLOOD BLOOD RIGHT ARM  Final   Special Requests   Final    BOTTLES DRAWN AEROBIC AND ANAEROBIC Blood Culture adequate volume   Culture   Final    NO GROWTH < 24 HOURS Performed at [facility], [address] [place],  [address]    Report Status PENDING  Incomplete    Radiology Reports DG Chest 1 View  Result Date: 07/22/2020 CLINICAL DATA:  Pleural effusion, status post left thoracentesis EXAM: CHEST  1 VIEW COMPARISON:  07/22/2020 at 10:50 a.m. FINDINGS: Prominent reduction in the size of the left pleural effusion compared to earlier exam, with only mild residual blunting of left costophrenic angle. No significant pneumothorax is identified. Persistent moderate right pleural effusion. Moderate enlargement of the cardiopericardial silhouette noted. Atherosclerotic calcification of the aortic arch. 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 IMPRESSION: 1. Prominent reduction in the size of the  left pleural effusion, with only mild residual blunting of the left costophrenic angle. No pneumothorax. 2. Persistent moderate right pleural effusion. 3. Moderate enlargement of the cardiopericardial silhouette. Electronically Signed   By: Van Clines M.D.   On: 07/22/2020 14:05   DG Chest 1 View  Result Date: 07/11/2020 CLINICAL DATA:  LEFT pleural effusion post thoracentesis EXAM: CHEST  1 VIEW COMPARISON:  07/10/2020 FINDINGS: Enlargement of cardiac silhouette with pulmonary vascular congestion. Atherosclerotic calcification  aorta. Small bibasilar pleural effusions and atelectasis. Improved aeration and decreased pleural effusions/atelectasis at LEFT base since prior exam. No pneumothorax. Bones demineralized. IMPRESSION: No pneumothorax following LEFT thoracentesis. Electronically Signed   By: Lavonia Dana M.D.   On: 07/11/2020 11:36   DG Chest 2 View  Result Date: 07/10/2020 CLINICAL DATA:  79 year old female with altered mental status. EXAM: CHEST - 2 VIEW COMPARISON:  Chest radiograph dated 06/30/2020. FINDINGS: There are bilateral pleural effusions, left greater right, which have increased in size compared to the prior radiograph. Bibasilar atelectasis or infiltrate, also increased since the prior radiograph. No pneumothorax. Stable cardiomegaly. Atherosclerotic calcification of the aorta. No acute osseous pathology. Surgical clips over the right chest as well as sclerotic lesion of the proximal left humeral diaphysis similar to prior radiograph. IMPRESSION: Bilateral pleural effusions, left greater right, and bibasilar atelectasis or infiltrate, increased since the prior radiograph. Electronically Signed   By: Anner Crete M.D.   On: 07/10/2020 15:51   DG Chest 2 View  Result Date: 06/30/2020 CLINICAL DATA:  Foul-smelling urine and abnormal EKG EXAM: CHEST - 2 VIEW COMPARISON:  02/13/2020 FINDINGS: Cardiac shadow is enlarged. Aortic calcifications are noted. Lungs are well aerated  bilaterally. Chronic blunting of the right costophrenic angle is noted. No new focal infiltrate is seen. Mild central vascular congestion is noted. IMPRESSION: Changes of mild CHF without edema. Chronic changes in the right base. Electronically Signed   By: Inez Catalina M.D.   On: 06/30/2020 15:32   CT HEAD WO CONTRAST  Result Date: 07/01/2020 CLINICAL DATA:  Altered mental status on top of baseline dementia. Chronic anticoagulation. EXAM: CT HEAD WITHOUT CONTRAST TECHNIQUE: Contiguous axial images were obtained from the base of the skull through the vertex without intravenous contrast. COMPARISON:  None. FINDINGS: Brain: Examination is technically limited by motion artifact. There is evidence of diffuse cerebral atrophy. Ventricular dilatation is likely due to central atrophy. Low-attenuation changes in the deep white matter likely represent small vessel ischemic change. No obvious mass effect or midline shift. No acute intracranial hemorrhage is demonstrated. No abnormal extra-axial fluid collections. Vascular: No acute vascular abnormality is identified. Skull: Visualized calvarium appears intact. Sinuses/Orbits: Visualized paranasal sinuses and mastoid air cells are clear. Other: None. IMPRESSION: 1. Technically limited study due to motion artifact. 2. No acute intracranial abnormalities are demonstrated. 3. Chronic atrophy and small vessel ischemic changes. Electronically Signed   By: Lucienne Capers M.D.   On: 07/01/2020 00:43   CT Angio Chest PE W and/or Wo Contrast  Result Date: 07/16/2020 CLINICAL DATA:  Dementia patient with shortness of breath and abdominal pain. EXAM: CT ANGIOGRAPHY CHEST CT ABDOMEN AND PELVIS WITH CONTRAST TECHNIQUE: Multidetector CT imaging of the chest was performed using the standard protocol during bolus administration of intravenous contrast. Multiplanar CT image reconstructions and MIPs were obtained to evaluate the vascular anatomy. Multidetector CT imaging of the  abdomen and pelvis was performed using the standard protocol during bolus administration of intravenous contrast. CONTRAST:  53mL OMNIPAQUE IOHEXOL 350 MG/ML SOLN COMPARISON:  Same day chest radiograph, CT chest dated 02/06/2020, CT abdomen pelvis dated 02/04/2019. FINDINGS: CTA CHEST FINDINGS Cardiovascular: Satisfactory opacification of the pulmonary arteries to the segmental level. No evidence of pulmonary embolism. Vascular calcifications are seen in the coronary arteries and aortic arch. There heart is enlarged and there is a moderate to large pericardial effusion. Mediastinum/Nodes: No enlarged mediastinal, hilar, or axillary lymph nodes. Aspirated debris is seen in the trachea (series 8 images 30 3-38). Thyroid  gland and esophagus demonstrate no significant findings. Lungs/Pleura: There is a large left and a small right pleural effusion with associated atelectasis. There are mild scattered bilateral ground-glass opacities. There is no pneumothorax. Musculoskeletal: Degenerative changes are seen in the spine. Review of the MIP images confirms the above findings. CT ABDOMEN and PELVIS FINDINGS Hepatobiliary: No focal liver abnormality is seen. The patient is status post cholecystectomy. There is moderate intra and extrahepatic biliary ductal dilatation with the common bile duct measuring up to 17 mm in size. Pancreas: Unremarkable. No pancreatic ductal dilatation or surrounding inflammatory changes. Spleen: Normal in size without focal abnormality. Adrenals/Urinary Tract: Adrenal glands are unremarkable. There is mild-to-moderate right hydronephrosis with a nondilated ureter which may reflect ureteropelvic junction obstruction. There is no hydronephrosis on the left. No renal calculi or focal renal lesion is identified on either side. Bladder is unremarkable. Stomach/Bowel: Stomach is within normal limits. No pericecal inflammatory changes are noted to suggest acute appendicitis. There is colonic diverticulosis  without evidence of diverticulitis. No evidence of bowel wall thickening, distention, or inflammatory changes. Vascular/Lymphatic: Aortic atherosclerosis. No enlarged abdominal or pelvic lymph nodes. Reproductive: Status post hysterectomy. No adnexal masses. Other: No abdominal wall hernia or abnormality. No abdominopelvic ascites. Anasarca is noted. Musculoskeletal: Degenerative changes are seen in the spine. There is 6 mm anterolisthesis of L4 on L5, not significantly changed since 12/05/2018. Review of the MIP images confirms the above findings. IMPRESSION: 1. No evidence of pulmonary embolism. 2. Large left and small right pleural effusions with associated atelectasis. 3. Mild scattered bilateral ground-glass opacities likely represent atelectasis and/or pneumonia. 4. Cardiomegaly with a moderate to large pericardial effusion. 5. Moderate intra and extrahepatic biliary ductal dilatation with the common bile duct measuring up to 17 mm in size. This may reflect post cholecystectomy state and patient age. Correlation with liver function tests is recommended. 6. Mild-to-moderate right hydronephrosis with a nondilated ureter which may reflect ureteropelvic junction obstruction. Aortic Atherosclerosis (ICD10-I70.0). Electronically Signed   By: Zerita Boers M.D.   On: 07/17/2020 18:08   CT ABDOMEN PELVIS W CONTRAST  Result Date: 07/19/2020 CLINICAL DATA:  Dementia patient with shortness of breath and abdominal pain. EXAM: CT ANGIOGRAPHY CHEST CT ABDOMEN AND PELVIS WITH CONTRAST TECHNIQUE: Multidetector CT imaging of the chest was performed using the standard protocol during bolus administration of intravenous contrast. Multiplanar CT image reconstructions and MIPs were obtained to evaluate the vascular anatomy. Multidetector CT imaging of the abdomen and pelvis was performed using the standard protocol during bolus administration of intravenous contrast. CONTRAST:  52mL OMNIPAQUE IOHEXOL 350 MG/ML SOLN COMPARISON:   Same day chest radiograph, CT chest dated 02/06/2020, CT abdomen pelvis dated 02/04/2019. FINDINGS: CTA CHEST FINDINGS Cardiovascular: Satisfactory opacification of the pulmonary arteries to the segmental level. No evidence of pulmonary embolism. Vascular calcifications are seen in the coronary arteries and aortic arch. There heart is enlarged and there is a moderate to large pericardial effusion. Mediastinum/Nodes: No enlarged mediastinal, hilar, or axillary lymph nodes. Aspirated debris is seen in the trachea (series 8 images 30 3-38). Thyroid gland and esophagus demonstrate no significant findings. Lungs/Pleura: There is a large left and a small right pleural effusion with associated atelectasis. There are mild scattered bilateral ground-glass opacities. There is no pneumothorax. Musculoskeletal: Degenerative changes are seen in the spine. Review of the MIP images confirms the above findings. CT ABDOMEN and PELVIS FINDINGS Hepatobiliary: No focal liver abnormality is seen. The patient is status post cholecystectomy. There is moderate intra and extrahepatic biliary  ductal dilatation with the common bile duct measuring up to 17 mm in size. Pancreas: Unremarkable. No pancreatic ductal dilatation or surrounding inflammatory changes. Spleen: Normal in size without focal abnormality. Adrenals/Urinary Tract: Adrenal glands are unremarkable. There is mild-to-moderate right hydronephrosis with a nondilated ureter which may reflect ureteropelvic junction obstruction. There is no hydronephrosis on the left. No renal calculi or focal renal lesion is identified on either side. Bladder is unremarkable. Stomach/Bowel: Stomach is within normal limits. No pericecal inflammatory changes are noted to suggest acute appendicitis. There is colonic diverticulosis without evidence of diverticulitis. No evidence of bowel wall thickening, distention, or inflammatory changes. Vascular/Lymphatic: Aortic atherosclerosis. No enlarged  abdominal or pelvic lymph nodes. Reproductive: Status post hysterectomy. No adnexal masses. Other: No abdominal wall hernia or abnormality. No abdominopelvic ascites. Anasarca is noted. Musculoskeletal: Degenerative changes are seen in the spine. There is 6 mm anterolisthesis of L4 on L5, not significantly changed since 12/05/2018. Review of the MIP images confirms the above findings. IMPRESSION: 1. No evidence of pulmonary embolism. 2. Large left and small right pleural effusions with associated atelectasis. 3. Mild scattered bilateral ground-glass opacities likely represent atelectasis and/or pneumonia. 4. Cardiomegaly with a moderate to large pericardial effusion. 5. Moderate intra and extrahepatic biliary ductal dilatation with the common bile duct measuring up to 17 mm in size. This may reflect post cholecystectomy state and patient age. Correlation with liver function tests is recommended. 6. Mild-to-moderate right hydronephrosis with a nondilated ureter which may reflect ureteropelvic junction obstruction. Aortic Atherosclerosis (ICD10-I70.0). Electronically Signed   By: Zerita Boers M.D.   On: 08/10/2020 18:08   DG Chest Port 1 View  Result Date: 07/22/2020 CLINICAL DATA:  Shortness of breath EXAM: PORTABLE CHEST 1 VIEW COMPARISON:  07/21/2020 FINDINGS: Cardiac shadow is stable. Bilateral pleural effusions are again identified. Central vascular congestion is again seen. Right basilar consolidation is noted stable from the prior exam. Stable calcifications in the proximal left humerus. No other bony abnormality is noted. IMPRESSION: Persistent bilateral pleural effusions and vascular congestion. Consolidation in the right base is again noted. Electronically Signed   By: Inez Catalina M.D.   On: 07/22/2020 11:10   DG CHEST PORT 1 VIEW  Result Date: 07/21/2020 CLINICAL DATA:  Shortness of breath EXAM: PORTABLE CHEST 1 VIEW COMPARISON:  July 18, 2020 FINDINGS: There is cardiomegaly. There are  persistent bilateral pleural effusions which have increased in size from the prior study. There are findings of congestive heart failure. There is no pneumothorax. Bibasilar airspace opacities are noted in favored to represent compressive atelectasis. There is a sclerotic lesion in the proximal left humerus favored to represent a bone infarct or enchondroma. IMPRESSION: 1. Cardiomegaly with findings of congestive heart failure. 2. Increasing bilateral pleural effusions with adjacent compressive atelectasis. Electronically Signed   By: Constance Holster M.D.   On: 07/21/2020 20:58   DG Chest Port 1 View  Result Date: 07/15/2020 CLINICAL DATA:  Shortness of breath. EXAM: PORTABLE CHEST 1 VIEW COMPARISON:  Radiograph 07/11/2020.  CT 02/06/2020 FINDINGS: Stable cardiomegaly. Unchanged mediastinal contours. Bilateral pleural effusions, not significantly changed on the right and increased on the left. Increased left basilar airspace disease typically atelectasis. Vascular congestion is similar. There is no pneumothorax. Stable osseous structures. Surgical clips in the right axilla. IMPRESSION: 1. Increased left pleural effusion with progressive left basilar airspace disease over the past week, typically atelectasis. 2. No significant change in right pleural effusion. 3. Stable cardiomegaly and vascular congestion. Electronically Signed   By: Threasa Beards  Sanford M.D.   On: 07/29/2020 16:03   ECHOCARDIOGRAM COMPLETE  Result Date: 07/19/2020    ECHOCARDIOGRAM REPORT   Patient Name:   RAMANDEEP ARINGTON Stallsmith Date of Exam: 07/19/2020 Medical Rec #:  [id number]       Height:       65.0 in Accession #:    [id number]      Weight:       149.9 lb Date of Birth:  1941-01-24       BSA:          1.750 m Patient Age:    59 years        BP:           125/75 mmHg Patient Gender: F               HR:           107 bpm. Exam Location:  [facility] Procedure: 2D Echo Indications:    Pericardial effusion 423.9 / I31.3  History:        Patient has  prior history of Echocardiogram examinations, most                 recent 12/17/2019. CHF, Pericardial Disease; Risk                 Factors:Hypertension, Dyslipidemia, Non-Smoker and Diabetes.                 Dementia, Bradycardia.  Sonographer:    Leavy Cella RDCS (AE) Referring Phys: [id number] OLADAPO ADEFESO IMPRESSIONS  1. Left ventricular ejection fraction, by estimation, is 50 to 55%. The left ventricle has low normal function. The left ventricle demonstrates regional wall motion abnormalities (see scoring diagram/findings for description). Left ventricular diastolic  parameters are indeterminate.  2. Right ventricular systolic function is low normal. The right ventricular size is normal.  3. Left atrial size was severely dilated.  4. A small pericardial effusion is present. The pericardial effusion is circumferential. There is no evidence of cardiac tamponade.  5. The mitral valve is normal in structure. Trivial mitral valve regurgitation. No evidence of mitral stenosis.  6. The aortic valve is tricuspid. Aortic valve regurgitation is not visualized. No aortic stenosis is present. FINDINGS  Left Ventricle: Left ventricular ejection fraction, by estimation, is 50 to 55%. The left ventricle has low normal function. The left ventricle demonstrates regional wall motion abnormalities. The left ventricular internal cavity size was normal in size. There is no left ventricular hypertrophy. Left ventricular diastolic parameters are indeterminate. Right Ventricle: The right ventricular size is normal. No increase in right ventricular wall thickness. Right ventricular systolic function is low normal. Left Atrium: Left atrial size was severely dilated. Right Atrium: Right atrial size was normal in size. Pericardium: A small pericardial effusion is present. The pericardial effusion is circumferential. There is no evidence of cardiac tamponade. Mitral Valve: The mitral valve is normal in structure. Trivial mitral valve  regurgitation. No evidence of mitral valve stenosis. Tricuspid Valve: The tricuspid valve is normal in structure. Tricuspid valve regurgitation is mild . No evidence of tricuspid stenosis. Aortic Valve: The aortic valve is tricuspid. Aortic valve regurgitation is not visualized. No aortic stenosis is present. Aortic valve mean gradient measures 4.1 mmHg. Aortic valve peak gradient measures 6.6 mmHg. Aortic valve area, by VTI measures 1.43 cm. Pulmonic Valve: The pulmonic valve was not well visualized. Pulmonic valve regurgitation is not visualized. No evidence of pulmonic stenosis. Aorta: The aortic root is normal in size and  structure. Pulmonary Artery: 34.  Venous: The inferior vena cava was not well visualized. IAS/Shunts: The interatrial septum was not well visualized.  LEFT VENTRICLE PLAX 2D LVIDd:         3.91 cm  Diastology LVIDs:         3.08 cm  LV e' medial:    5.22 cm/s LV PW:         0.90 cm  LV E/e' medial:  17.4 LV IVS:        0.88 cm  LV e' lateral:   5.15 cm/s LVOT diam:     1.90 cm  LV E/e' lateral: 17.6 LV SV:         45 LV SV Index:   26 LVOT Area:     2.84 cm  RIGHT VENTRICLE RV S prime:     6.18 cm/s TAPSE (M-mode): 1.6 cm LEFT ATRIUM             Index       RIGHT ATRIUM           Index LA diam:        4.40 cm 2.51 cm/m  RA Area:     15.80 cm LA Vol (A2C):   61.0 ml 34.86 ml/m RA Volume:   39.80 ml  22.74 ml/m LA Vol (A4C):   72.0 ml 41.14 ml/m LA Biplane Vol: 68.8 ml 39.31 ml/m  AORTIC VALVE AV Area (Vmax):    1.44 cm AV Area (Vmean):   1.40 cm AV Area (VTI):     1.43 cm AV Vmax:           128.76 cm/s AV Vmean:          95.477 cm/s AV VTI:            0.313 m AV Peak Grad:      6.6 mmHg AV Mean Grad:      4.1 mmHg LVOT Vmax:         65.55 cm/s LVOT Vmean:        47.156 cm/s LVOT VTI:          0.158 m LVOT/AV VTI ratio: 0.50  AORTA Ao Root diam: 2.60 cm MITRAL VALVE               TRICUSPID VALVE MV Area (PHT): 2.90 cm    TR Peak grad:   33.6 mmHg MV Decel Time: 262 msec    TR Vmax:         290.00 cm/s MV E velocity: 90.80 cm/s MV A velocity: 66.00 cm/s  SHUNTS MV E/A ratio:  1.38        Systemic VTI:  0.16 m                            Systemic Diam: 1.90 cm Carlyle Dolly MD Electronically signed by Carlyle Dolly MD Signature Date/Time: 07/19/2020/12:39:52 PM    Final    ECHOCARDIOGRAM LIMITED  Result Date: 07/22/2020    ECHOCARDIOGRAM LIMITED REPORT   Patient Name:   ARIEON SCALZO Stang Date of Exam: 07/22/2020 Medical Rec #:  [id number]       Height:       65.0 in Accession #:    [id number]      Weight:       134.5 lb Date of Birth:  May 13, 1941       BSA:          1.671 m Patient Age:  79 years        BP:           100/77 mmHg Patient Gender: F               HR:           47 bpm. Exam Location:  [facility] Procedure: Limited Echo                                 MODIFIED REPORT:  This report was modified by Rozann Lesches MD on 07/22/2020 due to Additional                                   information.  Indications:     Pericardial effusion 423.9 / I31.3  History:         Patient has prior history of Echocardiogram examinations, most                  recent 07/19/2020. CHF; Risk Factors:Hypertension, Dyslipidemia,                  Non-Smoker and Diabetes. Dysphagia, Bradycardia, Breast Cancer.  Sonographer:     Leavy Cella RDCS (AE) Referring Phys:  [id number] Gaspard Isbell Diagnosing Phys: Rozann Lesches MD IMPRESSIONS  1. Limited study for re-evaluation of pericardial effusion.  2. Left ventricular ejection fraction, by estimation, is 60 to 65%. The left ventricle has normal function. The left ventricle has no regional wall motion abnormalities.  3. Right ventricular systolic function is normal. The right ventricular size is normal. There is mildly elevated pulmonary artery systolic pressure. The estimated right ventricular systolic pressure is 05.3 mmHg.  4. A small pericardial effusion is present. The pericardial effusion is circumferential. Large pleural effusion in the left lateral  region.  5. The mitral valve is grossly normal. Trivial mitral valve regurgitation.  6. Tricuspid valve regurgitation is mild to moderate.  7. The aortic valve is tricuspid. There is mild thickening of the aortic valve.  8. The inferior vena cava is normal in size with greater than 50% respiratory variability, suggesting right atrial pressure of 3 mmHg. FINDINGS  Left Ventricle: Left ventricular ejection fraction, by estimation, is 60 to 65%. The left ventricle has normal function. The left ventricle has no regional wall motion abnormalities. The left ventricular internal cavity size was normal in size. There is  borderline left ventricular hypertrophy. Right Ventricle: The right ventricular size is normal. No increase in right ventricular wall thickness. Right ventricular systolic function is normal. There is mildly elevated pulmonary artery systolic pressure. The tricuspid regurgitant velocity is 2.90  m/s, and with an assumed right atrial pressure of 3 mmHg, the estimated right ventricular systolic pressure is 97.6 mmHg. Right Atrium: Prominent Chiari network. Pericardium: A small pericardial effusion is present. The pericardial effusion is circumferential. Mitral Valve: The mitral valve is grossly normal. Trivial mitral valve regurgitation. Tricuspid Valve: The tricuspid valve is grossly normal. Tricuspid valve regurgitation is mild to moderate. Aortic Valve: The aortic valve is tricuspid. There is mild thickening of the aortic valve. There is mild aortic valve annular calcification. Aorta: The aortic root is normal in size and structure. Venous: The inferior vena cava is normal in size with greater than 50% respiratory variability, suggesting right atrial pressure of 3 mmHg. Additional Comments: There is a large pleural effusion in the left  lateral region. LEFT VENTRICLE PLAX 2D LVIDd:         4.31 cm LVIDs:         2.34 cm LV PW:         1.53 cm LV IVS:        0.87 cm  LEFT ATRIUM         Index LA diam:     4.50 cm 2.69 cm/m   AORTA Ao Root diam: 2.60 cm MITRAL VALVE                TRICUSPID VALVE MV Area (PHT): 2.74 cm     TR Peak grad:   33.6 mmHg MV Decel Time: 277 msec     TR Vmax:        290.00 cm/s MV E velocity: 116.00 cm/s MV A velocity: 48.40 cm/s MV E/A ratio:  2.40 Rozann Lesches MD Electronically signed by Rozann Lesches MD Signature Date/Time: 07/22/2020/12:23:10 PM    Final (Updated)    US THORACENTESIS ASP PLEURAL SPACE W/IMG GUIDE  Result Date: 07/22/2020 INDICATION: Left pleural effusion EXAM: ULTRASOUND GUIDED LEFT THORACENTESIS MEDICATIONS: None. COMPLICATIONS: None immediate. PROCEDURE: The patient has dementia and is not able to consent for herself. I spoke to the patient's daughter by telephone. We discussed the reason for today's thoracentesis as well as the risks (including hemorrhage, infection, and pneumothorax), benefits, and alternatives to ultrasound-guided thoracentesis. We discussed the high likelihood of success of the procedure. The patient's daughter understood and elected for the patient undergo the procedure. Standard time-out was employed. Following sterile skin prep and local anesthetic administration consisting of 1% lidocaine, and following ultrasound localization, a Yueh catheter was advanced without difficulty into the left pleural fluid. Amber fluid was returned, and the needle removed with the catheter left in place. A total of 1100 cc of amber fluid was removed. The catheter was subsequently removed in the skin cleansed and bandaged. No immediate complication was observed. Following the procedure, a chest radiograph demonstrated no significant pneumothorax and marked reduction of the left pleural effusion. FINDINGS: A total of approximately 1100 cc of amber fluid was removed. IMPRESSION: Successful ultrasound guided left thoracentesis yielding 1100 cc of pleural fluid. Electronically Signed   By: Van Clines M.D.   On: 07/22/2020 14:02   US THORACENTESIS ASP  PLEURAL SPACE W/IMG GUIDE  Result Date: 07/11/2020 INDICATION: LEFT pleural effusion EXAM: ULTRASOUND GUIDED DIAGNOSTIC AND THERAPEUTIC THORACENTESIS MEDICATIONS: None COMPLICATIONS: None immediate PROCEDURE: An ultrasound guided thoracentesis was thoroughly discussed with the patient and questions answered. The benefits, risks, alternatives and complications were also discussed. The patient understands and wishes to proceed with the procedure. Written consent was obtained. Ultrasound was performed to localize and mark an adequate pocket of fluid in the LEFT chest. The area was then prepped and draped in the normal sterile fashion. 1% Lidocaine was used for local anesthesia. Under ultrasound guidance a 8 French thoracentesis catheter was introduced. Thoracentesis was performed. The catheter was removed and a dressing applied. FINDINGS: A total of approximately 630 mL of yellow LEFT pleural fluid was removed. Samples were sent to the laboratory as requested by the clinical team. IMPRESSION: Successful ultrasound guided LEFT thoracentesis yielding 630 mL of pleural fluid. Electronically Signed   By: Lavonia Dana M.D.   On: 07/11/2020 11:35     CBC Recent Labs  Lab 07/25/2020 1500 07/19/20 0351 07/21/20 0543 07/22/20 0629 07/23/20 0530  WBC 9.9 9.5 11.5* 8.9 13.0*  HGB 10.4* 10.0* 8.6* 8.7* 8.9*  HCT 35.1* 35.7* 31.0* 32.4* 33.2*  PLT 315 330 220 176 200  MCV 87.5 90.6 93.4 95.9 93.3  MCH 25.9* 25.4* 25.9* 25.7* 25.0*  MCHC 29.6* 28.0* 27.7* 26.9* 26.8*  RDW 16.2* 16.3* 16.4* 16.5* 16.6*  LYMPHSABS 0.8  --   --   --   --   MONOABS 0.8  --   --   --   --   EOSABS 0.1  --   --   --   --   BASOSABS 0.0  --   --   --   --     Chemistries  Recent Labs  Lab 08/04/2020 1500 08/09/2020 1500 07/19/20 0351 07/21/20 0543 07/21/20 2344 07/22/20 0629 07/23/20 0530  NA 142   < > 145 144 143 144 146*  K 4.6   < > 4.8 4.8 5.1 5.0 5.5*  CL 107   < > 108 107 110 107 108  CO2 30   < > 29 29 25 28 25    GLUCOSE 183*   < > 167* 143* 137* 155* 168*  BUN 22   < > 18 27* 30* 34* 43*  CREATININE 0.80   < > 0.70 1.34* 1.69* 1.80* 2.28*  CALCIUM 8.8*   < > 8.9 8.6* 8.2* 8.5* 8.3*  MG  --   --  2.0  --  2.2  --   --   AST 22  --  20  --   --   --   --   ALT 35  --  33  --   --   --   --   ALKPHOS 106  --  95  --   --   --   --   BILITOT 0.9  --  0.7  --   --   --   --    < > = values in this interval not displayed.   ------------------------------------------------------------------------------------------------------------------ No results for input(s): CHOL, HDL, LDLCALC, TRIG, CHOLHDL, LDLDIRECT in the last 72 hours.  Lab Results  Component Value Date   HGBA1C 7.7 (H) 06/30/2020   Recent Labs    07/22/20 1433  TSH 2.712   ------------------------------------------------------------------------------------------------------------------ No results for input(s): VITAMINB12, FOLATE, FERRITIN, TIBC, IRON, RETICCTPCT in the last 72 hours.  Coagulation profile Recent Labs  Lab 07/19/20 0351  INR 1.3*    No results for input(s): DDIMER in the last 72 hours.  Cardiac Enzymes No results for input(s): CKMB, TROPONINI, MYOGLOBIN in the last 168 hours.  Invalid input(s): CK ------------------------------------------------------------------------------------------------------------------    Component Value Date/Time   BNP 375.0 (H) 08/12/2020 1500   Roxan Hockey M.D on 07/23/2020 at 3:44 PM  Go to [email or web address] - for contact info  [facility] - Office  [phone]

## 2020-07-23 NOTE — TOC Progression Note (Signed)
Transition of Care [facility]) - Progression Note    Patient Details  Name: Amy Hoffman MRN: [id number] Date of Birth: [birth year]  Transition of Care [facility]) CM/SW Contact  Shade Flood, LCSW Phone Number: 07/23/2020, 4:18 PM  Clinical Narrative:     TOC following. Palliative APNP has met with family. Per MD, family making decision about dc disposition and whether they want to transition pt to comfort care. If they do decide to take pt home with Home Health, [facility] will not resume care so she will need a new agency for [place].  Assigned TOC will follow.   Expected Discharge Plan: [place] Barriers to Discharge: Continued Medical Work up  Expected Discharge Plan and Services Expected Discharge Plan: [place] In-house Referral: Clinical Social Work     Living arrangements for the past 2 months: Single Family Home                                       Social Determinants of Health (SDOH) Interventions    Readmission Risk Interventions Readmission Risk Prevention Plan 07/19/2020 07/11/2020 07/01/2020  Transportation Screening Complete Complete Complete  Medication Review Press photographer) Complete Complete Complete  PCP or Specialist appointment within 3-5 days of discharge - - -  [place] or [place] Complete Complete Complete  SW Recovery Care/Counseling Consult - Complete Complete  Palliative Care Screening - Complete Not [place] Not Applicable Complete Not Applicable  Some recent data might be hidden

## 2020-07-23 NOTE — Progress Notes (Signed)
MD personally notified of current heart rate and SaO2 89% on 2.5 lpm [place]. No new orders. Pt did not take any oral intake for breakfast, not resistive to oral care but not attempting to swallow or bite/suck on swab when mouth swabbed. Have talked with daughter on phone and advised of same. Daughter is insistent that we continue to offer fluids often. Advised that if pt will not even attempt to swallow then putting liquids in her mouth is unsafe due to potential for aspiration.

## 2020-07-24 DIAGNOSIS — E43 Unspecified severe protein-calorie malnutrition: Secondary | ICD-10-CM | POA: Diagnosis present

## 2020-07-24 DIAGNOSIS — J9601 Acute respiratory failure with hypoxia: Secondary | ICD-10-CM | POA: Diagnosis not present

## 2020-07-24 DIAGNOSIS — Z7189 Other specified counseling: Secondary | ICD-10-CM | POA: Diagnosis not present

## 2020-07-24 DIAGNOSIS — Z515 Encounter for palliative care: Secondary | ICD-10-CM | POA: Diagnosis not present

## 2020-07-24 LAB — GLUCOSE, CAPILLARY
Glucose-Capillary: 156 mg/dL — ABNORMAL HIGH (ref 70–99)
Glucose-Capillary: 169 mg/dL — ABNORMAL HIGH (ref 70–99)
Glucose-Capillary: 180 mg/dL — ABNORMAL HIGH (ref 70–99)

## 2020-07-24 MED ORDER — ONDANSETRON HCL 4 MG/2ML IJ SOLN: 4.0000 mg | Freq: Four times a day (QID) | INTRAMUSCULAR | Status: DC | PRN

## 2020-07-24 MED ORDER — GLYCOPYRROLATE 1 MG PO TABS: 1.0000 mg | ORAL_TABLET | ORAL | Status: DC | PRN

## 2020-07-24 MED ORDER — ONDANSETRON 4 MG PO TBDP: 4.0000 mg | ORAL_TABLET | Freq: Four times a day (QID) | ORAL | Status: DC | PRN

## 2020-07-24 MED ORDER — POLYVINYL ALCOHOL 1.4 % OP SOLN: 1.0000 [drp] | Freq: Four times a day (QID) | OPHTHALMIC | Status: DC | PRN

## 2020-07-24 MED ORDER — ACETAMINOPHEN 325 MG PO TABS: 650.0000 mg | ORAL_TABLET | Freq: Four times a day (QID) | ORAL | Status: DC | PRN

## 2020-07-24 MED ORDER — ACETAMINOPHEN 650 MG RE SUPP: 650.0000 mg | Freq: Four times a day (QID) | RECTAL | Status: DC | PRN

## 2020-07-24 MED ORDER — GLYCOPYRROLATE 0.2 MG/ML IJ SOLN: 0.2000 mg | INTRAMUSCULAR | Status: DC | PRN

## 2020-07-24 MED ORDER — MORPHINE SULFATE (PF) 2 MG/ML IV SOLN: 1.0000 mg | INTRAVENOUS | Status: DC | PRN

## 2020-07-24 MED ORDER — BIOTENE DRY MOUTH MT LIQD: 15.0000 mL | OROMUCOSAL | Status: DC | PRN

## 2020-07-24 MED ORDER — HALOPERIDOL LACTATE 2 MG/ML PO CONC
0.5000 mg | ORAL | Status: DC | PRN
  Filled 2020-07-24: qty 0.3

## 2020-07-24 MED ORDER — HALOPERIDOL LACTATE 5 MG/ML IJ SOLN: 0.5000 mg | INTRAMUSCULAR | Status: DC | PRN

## 2020-07-24 MED ORDER — HALOPERIDOL 0.5 MG PO TABS: 0.5000 mg | ORAL_TABLET | ORAL | Status: DC | PRN

## 2020-07-24 MED FILL — Insulin Aspart Inj 100 Unit/ML: SUBCUTANEOUS | Qty: 0.03 | Status: AC

## 2020-07-27 LAB — CULTURE, BLOOD (ROUTINE X 2)
Culture  Setup Time: NONE SEEN
Culture: NO GROWTH
Culture: NO GROWTH
Special Requests: ADEQUATE
Special Requests: ADEQUATE

## 2020-07-30 ENCOUNTER — Ambulatory Visit: Payer: Medicare Other | Admitting: Diagnostic Neuroimaging

## 2020-08-14 NOTE — Progress Notes (Signed)
Pt's respirations more shallow, SaO2 85% on 3 lpm [place]. Pt remains unresponsive. MD Memon updated on current condition and in room to assess. Have attempted to contact husband, son and daughter at numbers listed to update on pt condition but no answer at any number. Messages left with all 4 numbers listed.

## 2020-08-14 NOTE — Progress Notes (Signed)
VSS, SaO2 100% on 3 lpm [facility]. Pt not responsive at this time, MD Memon on unit see another pt, notified in person of current status, states is coming to see her now. No new orders at this time.

## 2020-08-14 NOTE — Progress Notes (Signed)
Pt expired at 1133. Two nurses present in room when pt took last breath. Absence of heart tones and respirations validated by both RN's. MD Memon notified and he notified family members. Equipment removed, pt cleaned and body readied for family viewing. Chaplain here, staff took moment of silence and prayer to honor pt. Chaplain down to lobby to greet family when they arrive.

## 2020-08-14 NOTE — Progress Notes (Signed)
Patients oxygen level was 80% on 2.5l of oxygen respiratory called to evaluate patient. Respiratory placed patient on 5l of oxygen.Patients oxygen saturation increased to 98% on 5L.  Patient responsive to sound and stimuli.

## 2020-08-14 NOTE — Progress Notes (Signed)
Palliative: Amy Hoffman is lying quietly in bed.  She appears acutely/chronically ill.  She appears to be actively dying.  She does not respond to voice or touch.  There is no family at bedside at this time.  Detailed conference with attending who is in contact with family, and transition of care team related to patient condition, needs, hospice care.  Amy Hoffman passed away this morning.  Plan: Comfort care per attending.  Family has been considering hospice care, but it seems that Amy Hoffman will experience in hospital death.  52 minutes [name], NP Palliative Medicine Team Team Phone # [phone] Greater than 50% of this time was spent counseling and coordinating care related to the above assessment and plan.

## 2020-08-14 NOTE — Death Summary Note (Signed)
DEATH SUMMARY   Patient Details  Name: Amy Hoffman MRN: [id number] DOB: Feb 02, 1941  Admission/Discharge Information   Admit Date:  08-09-20  Date of Death: Date of Death: August 15, 2020  Time of Death: Time of Death: Dec 26, 1131  Length of Stay: Dec 10, 2022  Referring Physician: [name], NP   Reason(s) for Hospitalization  Shortness of breath due to bilateral pleural effusions  Diagnoses  Preliminary cause of death: Acute respiratory failure with hypoxia due to pleural effusions Secondary Diagnoses (including complications and co-morbidities):  Principal Problem:   Acute respiratory failure with hypoxia ([place]) Active Problems:   Essential hypertension   Dementia with behavioral disturbance ([place])   Palliative care by specialist   Do not resuscitate status   Hyperlipidemia associated with type 2 diabetes mellitus ([place])   AKI (acute kidney injury) ([place])   Tachycardia-bradycardia syndrome ([place])   Failure to thrive in adult   Acute on chronic diastolic CHF ([place])   Pleural effusion   Hypoalbuminemia   Prolonged QT interval   Pericardial effusion   Hydronephrosis   Hyperglycemia due to diabetes mellitus ([facility])   Elevated brain natriuretic peptide (BNP) level   Atelectasis of left lung   Goals of care, counseling/discussion   Pressure injury of skin   Encounter for hospice care discussion   Severe protein-calorie malnutrition [facility])   Brief Hospital Course (including significant findings, care, treatment, and services provided and events leading to death)  Amy Hoffman is a 79 y.o. year old female who has multiple medical problems including diastolic heart failure, dementia, failure to thrive, who has had a general decline for quite some time now.  Patient was admitted to the hospital with progressive shortness of breath and found to have bilateral pleural effusions.  She underwent thoracentesis without significant improvement of symptoms.  She was seen by cardiology.  She was started on  IV Lasix, but did not have significant diuresis and her creatinine started to trend up.  Palliative care was consulted to help address goals of care.  Considering her overall poor functional/nutritional status and general decline over the last several weeks to months, transition to comfort care was recommended.  After several conversations with the family, they did eventually agree to transition towards a hospice approach.  After this decision was made, patient's decline was rather rapid.  She became increasingly short of breath and hypoxic.  Patient did pass away in the hospital.  Family was informed and provided support.    Pertinent Labs and Studies  Significant Diagnostic Studies DG Chest 1 View  Result Date: 07/22/2020 CLINICAL DATA:  Pleural effusion, status post left thoracentesis EXAM: CHEST  1 VIEW COMPARISON:  07/22/2020 at 10:50 a.m. FINDINGS: Prominent reduction in the size of the left pleural effusion compared to earlier exam, with only mild residual blunting of left costophrenic angle. No significant pneumothorax is identified. Persistent moderate right pleural effusion. Moderate enlargement of the cardiopericardial silhouette noted. Atherosclerotic calcification of the aortic arch. 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 IMPRESSION: 1. Prominent reduction in the size of the left pleural effusion, with only mild residual blunting of the left costophrenic angle. No pneumothorax. 2. Persistent moderate right pleural effusion. 3. Moderate enlargement of the cardiopericardial silhouette. Electronically Signed   By: Van Clines M.D.   On: 07/22/2020 14:05   DG Chest 1 View  Result Date: 07/11/2020 CLINICAL DATA:  LEFT pleural effusion post thoracentesis EXAM: CHEST  1 VIEW COMPARISON:  07/10/2020 FINDINGS: Enlargement of cardiac silhouette with pulmonary vascular  congestion. Atherosclerotic calcification aorta. Small bibasilar pleural effusions and atelectasis. Improved  aeration and decreased pleural effusions/atelectasis at LEFT base since prior exam. No pneumothorax. Bones demineralized. IMPRESSION: No pneumothorax following LEFT thoracentesis. Electronically Signed   By: Lavonia Dana M.D.   On: 07/11/2020 11:36   DG Chest 2 View  Result Date: 07/10/2020 CLINICAL DATA:  79 year old female with altered mental status. EXAM: CHEST - 2 VIEW COMPARISON:  Chest radiograph dated 06/30/2020. FINDINGS: There are bilateral pleural effusions, left greater right, which have increased in size compared to the prior radiograph. Bibasilar atelectasis or infiltrate, also increased since the prior radiograph. No pneumothorax. Stable cardiomegaly. Atherosclerotic calcification of the aorta. No acute osseous pathology. Surgical clips over the right chest as well as sclerotic lesion of the proximal left humeral diaphysis similar to prior radiograph. IMPRESSION: Bilateral pleural effusions, left greater right, and bibasilar atelectasis or infiltrate, increased since the prior radiograph. Electronically Signed   By: Anner Crete M.D.   On: 07/10/2020 15:51   DG Chest 2 View  Result Date: 06/30/2020 CLINICAL DATA:  Foul-smelling urine and abnormal EKG EXAM: CHEST - 2 VIEW COMPARISON:  02/13/2020 FINDINGS: Cardiac shadow is enlarged. Aortic calcifications are noted. Lungs are well aerated bilaterally. Chronic blunting of the right costophrenic angle is noted. No new focal infiltrate is seen. Mild central vascular congestion is noted. IMPRESSION: Changes of mild CHF without edema. Chronic changes in the right base. Electronically Signed   By: Inez Catalina M.D.   On: 06/30/2020 15:32   CT HEAD WO CONTRAST  Result Date: 07/01/2020 CLINICAL DATA:  Altered mental status on top of baseline dementia. Chronic anticoagulation. EXAM: CT HEAD WITHOUT CONTRAST TECHNIQUE: Contiguous axial images were obtained from the base of the skull through the vertex without intravenous contrast. COMPARISON:   None. FINDINGS: Brain: Examination is technically limited by motion artifact. There is evidence of diffuse cerebral atrophy. Ventricular dilatation is likely due to central atrophy. Low-attenuation changes in the deep white matter likely represent small vessel ischemic change. No obvious mass effect or midline shift. No acute intracranial hemorrhage is demonstrated. No abnormal extra-axial fluid collections. Vascular: No acute vascular abnormality is identified. Skull: Visualized calvarium appears intact. Sinuses/Orbits: Visualized paranasal sinuses and mastoid air cells are clear. Other: None. IMPRESSION: 1. Technically limited study due to motion artifact. 2. No acute intracranial abnormalities are demonstrated. 3. Chronic atrophy and small vessel ischemic changes. Electronically Signed   By: Lucienne Capers M.D.   On: 07/01/2020 00:43   CT Angio Chest PE W and/or Wo Contrast  Result Date: 07/29/2020 CLINICAL DATA:  Dementia patient with shortness of breath and abdominal pain. EXAM: CT ANGIOGRAPHY CHEST CT ABDOMEN AND PELVIS WITH CONTRAST TECHNIQUE: Multidetector CT imaging of the chest was performed using the standard protocol during bolus administration of intravenous contrast. Multiplanar CT image reconstructions and MIPs were obtained to evaluate the vascular anatomy. Multidetector CT imaging of the abdomen and pelvis was performed using the standard protocol during bolus administration of intravenous contrast. CONTRAST:  38mL OMNIPAQUE IOHEXOL 350 MG/ML SOLN COMPARISON:  Same day chest radiograph, CT chest dated 02/06/2020, CT abdomen pelvis dated 02/04/2019. FINDINGS: CTA CHEST FINDINGS Cardiovascular: Satisfactory opacification of the pulmonary arteries to the segmental level. No evidence of pulmonary embolism. Vascular calcifications are seen in the coronary arteries and aortic arch. There heart is enlarged and there is a moderate to large pericardial effusion. Mediastinum/Nodes: No enlarged  mediastinal, hilar, or axillary lymph nodes. Aspirated debris is seen in the trachea (series 8  images 30 3-38). Thyroid gland and esophagus demonstrate no significant findings. Lungs/Pleura: There is a large left and a small right pleural effusion with associated atelectasis. There are mild scattered bilateral ground-glass opacities. There is no pneumothorax. Musculoskeletal: Degenerative changes are seen in the spine. Review of the MIP images confirms the above findings. CT ABDOMEN and PELVIS FINDINGS Hepatobiliary: No focal liver abnormality is seen. The patient is status post cholecystectomy. There is moderate intra and extrahepatic biliary ductal dilatation with the common bile duct measuring up to 17 mm in size. Pancreas: Unremarkable. No pancreatic ductal dilatation or surrounding inflammatory changes. Spleen: Normal in size without focal abnormality. Adrenals/Urinary Tract: Adrenal glands are unremarkable. There is mild-to-moderate right hydronephrosis with a nondilated ureter which may reflect ureteropelvic junction obstruction. There is no hydronephrosis on the left. No renal calculi or focal renal lesion is identified on either side. Bladder is unremarkable. Stomach/Bowel: Stomach is within normal limits. No pericecal inflammatory changes are noted to suggest acute appendicitis. There is colonic diverticulosis without evidence of diverticulitis. No evidence of bowel wall thickening, distention, or inflammatory changes. Vascular/Lymphatic: Aortic atherosclerosis. No enlarged abdominal or pelvic lymph nodes. Reproductive: Status post hysterectomy. No adnexal masses. Other: No abdominal wall hernia or abnormality. No abdominopelvic ascites. Anasarca is noted. Musculoskeletal: Degenerative changes are seen in the spine. There is 6 mm anterolisthesis of L4 on L5, not significantly changed since 12/05/2018. Review of the MIP images confirms the above findings. IMPRESSION: 1. No evidence of pulmonary embolism. 2.  Large left and small right pleural effusions with associated atelectasis. 3. Mild scattered bilateral ground-glass opacities likely represent atelectasis and/or pneumonia. 4. Cardiomegaly with a moderate to large pericardial effusion. 5. Moderate intra and extrahepatic biliary ductal dilatation with the common bile duct measuring up to 17 mm in size. This may reflect post cholecystectomy state and patient age. Correlation with liver function tests is recommended. 6. Mild-to-moderate right hydronephrosis with a nondilated ureter which may reflect ureteropelvic junction obstruction. Aortic Atherosclerosis (ICD10-I70.0). Electronically Signed   By: Zerita Boers M.D.   On: 08/02/2020 18:08   CT ABDOMEN PELVIS W CONTRAST  Result Date: 07/20/2020 CLINICAL DATA:  Dementia patient with shortness of breath and abdominal pain. EXAM: CT ANGIOGRAPHY CHEST CT ABDOMEN AND PELVIS WITH CONTRAST TECHNIQUE: Multidetector CT imaging of the chest was performed using the standard protocol during bolus administration of intravenous contrast. Multiplanar CT image reconstructions and MIPs were obtained to evaluate the vascular anatomy. Multidetector CT imaging of the abdomen and pelvis was performed using the standard protocol during bolus administration of intravenous contrast. CONTRAST:  11mL OMNIPAQUE IOHEXOL 350 MG/ML SOLN COMPARISON:  Same day chest radiograph, CT chest dated 02/06/2020, CT abdomen pelvis dated 02/04/2019. FINDINGS: CTA CHEST FINDINGS Cardiovascular: Satisfactory opacification of the pulmonary arteries to the segmental level. No evidence of pulmonary embolism. Vascular calcifications are seen in the coronary arteries and aortic arch. There heart is enlarged and there is a moderate to large pericardial effusion. Mediastinum/Nodes: No enlarged mediastinal, hilar, or axillary lymph nodes. Aspirated debris is seen in the trachea (series 8 images 30 3-38). Thyroid gland and esophagus demonstrate no significant  findings. Lungs/Pleura: There is a large left and a small right pleural effusion with associated atelectasis. There are mild scattered bilateral ground-glass opacities. There is no pneumothorax. Musculoskeletal: Degenerative changes are seen in the spine. Review of the MIP images confirms the above findings. CT ABDOMEN and PELVIS FINDINGS Hepatobiliary: No focal liver abnormality is seen. The patient is status post cholecystectomy. There is moderate  intra and extrahepatic biliary ductal dilatation with the common bile duct measuring up to 17 mm in size. Pancreas: Unremarkable. No pancreatic ductal dilatation or surrounding inflammatory changes. Spleen: Normal in size without focal abnormality. Adrenals/Urinary Tract: Adrenal glands are unremarkable. There is mild-to-moderate right hydronephrosis with a nondilated ureter which may reflect ureteropelvic junction obstruction. There is no hydronephrosis on the left. No renal calculi or focal renal lesion is identified on either side. Bladder is unremarkable. Stomach/Bowel: Stomach is within normal limits. No pericecal inflammatory changes are noted to suggest acute appendicitis. There is colonic diverticulosis without evidence of diverticulitis. No evidence of bowel wall thickening, distention, or inflammatory changes. Vascular/Lymphatic: Aortic atherosclerosis. No enlarged abdominal or pelvic lymph nodes. Reproductive: Status post hysterectomy. No adnexal masses. Other: No abdominal wall hernia or abnormality. No abdominopelvic ascites. Anasarca is noted. Musculoskeletal: Degenerative changes are seen in the spine. There is 6 mm anterolisthesis of L4 on L5, not significantly changed since 12/05/2018. Review of the MIP images confirms the above findings. IMPRESSION: 1. No evidence of pulmonary embolism. 2. Large left and small right pleural effusions with associated atelectasis. 3. Mild scattered bilateral ground-glass opacities likely represent atelectasis and/or  pneumonia. 4. Cardiomegaly with a moderate to large pericardial effusion. 5. Moderate intra and extrahepatic biliary ductal dilatation with the common bile duct measuring up to 17 mm in size. This may reflect post cholecystectomy state and patient age. Correlation with liver function tests is recommended. 6. Mild-to-moderate right hydronephrosis with a nondilated ureter which may reflect ureteropelvic junction obstruction. Aortic Atherosclerosis (ICD10-I70.0). Electronically Signed   By: Zerita Boers M.D.   On: 08/12/2020 18:08   DG Chest Port 1 View  Result Date: 07/22/2020 CLINICAL DATA:  Shortness of breath EXAM: PORTABLE CHEST 1 VIEW COMPARISON:  07/21/2020 FINDINGS: Cardiac shadow is stable. Bilateral pleural effusions are again identified. Central vascular congestion is again seen. Right basilar consolidation is noted stable from the prior exam. Stable calcifications in the proximal left humerus. No other bony abnormality is noted. IMPRESSION: Persistent bilateral pleural effusions and vascular congestion. Consolidation in the right base is again noted. Electronically Signed   By: Inez Catalina M.D.   On: 07/22/2020 11:10   DG CHEST PORT 1 VIEW  Result Date: 07/21/2020 CLINICAL DATA:  Shortness of breath EXAM: PORTABLE CHEST 1 VIEW COMPARISON:  July 18, 2020 FINDINGS: There is cardiomegaly. There are persistent bilateral pleural effusions which have increased in size from the prior study. There are findings of congestive heart failure. There is no pneumothorax. Bibasilar airspace opacities are noted in favored to represent compressive atelectasis. There is a sclerotic lesion in the proximal left humerus favored to represent a bone infarct or enchondroma. IMPRESSION: 1. Cardiomegaly with findings of congestive heart failure. 2. Increasing bilateral pleural effusions with adjacent compressive atelectasis. Electronically Signed   By: Constance Holster M.D.   On: 07/21/2020 20:58   DG Chest Port 1  View  Result Date: 07/27/2020 CLINICAL DATA:  Shortness of breath. EXAM: PORTABLE CHEST 1 VIEW COMPARISON:  Radiograph 07/11/2020.  CT 02/06/2020 FINDINGS: Stable cardiomegaly. Unchanged mediastinal contours. Bilateral pleural effusions, not significantly changed on the right and increased on the left. Increased left basilar airspace disease typically atelectasis. Vascular congestion is similar. There is no pneumothorax. Stable osseous structures. Surgical clips in the right axilla. IMPRESSION: 1. Increased left pleural effusion with progressive left basilar airspace disease over the past week, typically atelectasis. 2. No significant change in right pleural effusion. 3. Stable cardiomegaly and vascular congestion. Electronically Signed  By: Keith Rake M.D.   On: 07/25/2020 16:03   ECHOCARDIOGRAM COMPLETE  Result Date: 07/19/2020    ECHOCARDIOGRAM REPORT   Patient Name:   Amy Hoffman Desch Date of Exam: 07/19/2020 Medical Rec #:  [id number]       Height:       65.0 in Accession #:    [id number]      Weight:       149.9 lb Date of Birth:  [birth year]       BSA:          1.750 m Patient Age:    1 years        BP:           125/75 mmHg Patient Gender: F               HR:           107 bpm. Exam Location:  [facility] Procedure: 2D Echo Indications:    Pericardial effusion 423.9 / I31.3  History:        Patient has prior history of Echocardiogram examinations, most                 recent 12/17/2019. CHF, Pericardial Disease; Risk                 Factors:Hypertension, Dyslipidemia, Non-Smoker and Diabetes.                 Dementia, Bradycardia.  Sonographer:    Leavy Cella RDCS ([place]) Referring Phys: [id number] OLADAPO ADEFESO IMPRESSIONS  1. Left ventricular ejection fraction, by estimation, is 50 to 55%. The left ventricle has low normal function. The left ventricle demonstrates regional wall motion abnormalities (see scoring diagram/findings for description). Left ventricular diastolic  parameters are  indeterminate.  2. Right ventricular systolic function is low normal. The right ventricular size is normal.  3. Left atrial size was severely dilated.  4. A small pericardial effusion is present. The pericardial effusion is circumferential. There is no evidence of cardiac tamponade.  5. The mitral valve is normal in structure. Trivial mitral valve regurgitation. No evidence of mitral stenosis.  6. The aortic valve is tricuspid. Aortic valve regurgitation is not visualized. No aortic stenosis is present. FINDINGS  Left Ventricle: Left ventricular ejection fraction, by estimation, is 50 to 55%. The left ventricle has low normal function. The left ventricle demonstrates regional wall motion abnormalities. The left ventricular internal cavity size was normal in size. There is no left ventricular hypertrophy. Left ventricular diastolic parameters are indeterminate. Right Ventricle: The right ventricular size is normal. No increase in right ventricular wall thickness. Right ventricular systolic function is low normal. Left Atrium: Left atrial size was severely dilated. Right Atrium: Right atrial size was normal in size. Pericardium: A small pericardial effusion is present. The pericardial effusion is circumferential. There is no evidence of cardiac tamponade. Mitral Valve: The mitral valve is normal in structure. Trivial mitral valve regurgitation. No evidence of mitral valve stenosis. Tricuspid Valve: The tricuspid valve is normal in structure. Tricuspid valve regurgitation is mild . No evidence of tricuspid stenosis. Aortic Valve: The aortic valve is tricuspid. Aortic valve regurgitation is not visualized. No aortic stenosis is present. Aortic valve mean gradient measures 4.1 mmHg. Aortic valve peak gradient measures 6.6 mmHg. Aortic valve area, by VTI measures 1.43 cm. Pulmonic Valve: The pulmonic valve was not well visualized. Pulmonic valve regurgitation is not visualized. No evidence of pulmonic stenosis. Aorta:  The aortic root is normal  in size and structure. Pulmonary Artery: 34.  Venous: The inferior vena cava was not well visualized. IAS/Shunts: The interatrial septum was not well visualized.  LEFT VENTRICLE PLAX 2D LVIDd:         3.91 cm  Diastology LVIDs:         3.08 cm  LV e' medial:    5.22 cm/s LV PW:         0.90 cm  LV E/e' medial:  17.4 LV IVS:        0.88 cm  LV e' lateral:   5.15 cm/s LVOT diam:     1.90 cm  LV E/e' lateral: 17.6 LV SV:         45 LV SV Index:   26 LVOT Area:     2.84 cm  RIGHT VENTRICLE RV S prime:     6.18 cm/s TAPSE (M-mode): 1.6 cm LEFT ATRIUM             Index       RIGHT ATRIUM           Index LA diam:        4.40 cm 2.51 cm/m  RA Area:     15.80 cm LA Vol (A2C):   61.0 ml 34.86 ml/m RA Volume:   39.80 ml  22.74 ml/m LA Vol (A4C):   72.0 ml 41.14 ml/m LA Biplane Vol: 68.8 ml 39.31 ml/m  AORTIC VALVE AV Area (Vmax):    1.44 cm AV Area (Vmean):   1.40 cm AV Area (VTI):     1.43 cm AV Vmax:           128.76 cm/s AV Vmean:          95.477 cm/s AV VTI:            0.313 m AV Peak Grad:      6.6 mmHg AV Mean Grad:      4.1 mmHg LVOT Vmax:         65.55 cm/s LVOT Vmean:        47.156 cm/s LVOT VTI:          0.158 m LVOT/AV VTI ratio: 0.50  AORTA Ao Root diam: 2.60 cm MITRAL VALVE               TRICUSPID VALVE MV Area (PHT): 2.90 cm    TR Peak grad:   33.6 mmHg MV Decel Time: 262 msec    TR Vmax:        290.00 cm/s MV E velocity: 90.80 cm/s MV A velocity: 66.00 cm/s  SHUNTS MV E/A ratio:  1.38        Systemic VTI:  0.16 m                            Systemic Diam: 1.90 cm Carlyle Dolly MD Electronically signed by Carlyle Dolly MD Signature Date/Time: 07/19/2020/12:39:52 PM    Final    ECHOCARDIOGRAM LIMITED  Result Date: 07/22/2020    ECHOCARDIOGRAM LIMITED REPORT   Patient Name:   Amy Hoffman Groll Date of Exam: 07/22/2020 Medical Rec #:  [id number]       Height:       65.0 in Accession #:    [id number]      Weight:       134.5 lb Date of Birth:  [birth year]       BSA:          1.671  m Patient Age:    58 years        BP:           100/77 mmHg Patient Gender: F               HR:           47 bpm. Exam Location:  [facility] Procedure: Limited Echo                                 MODIFIED REPORT:  This report was modified by Rozann Lesches MD on 07/22/2020 due to Additional                                   information.  Indications:     Pericardial effusion 423.9 / I31.3  History:         Patient has prior history of Echocardiogram examinations, most                  recent 07/19/2020. CHF; Risk Factors:Hypertension, Dyslipidemia,                  Non-Smoker and Diabetes. Dysphagia, Bradycardia, Breast Cancer.  Sonographer:     Leavy Cella RDCS ([place]) Referring Phys:  [id number] COURAGE EMOKPAE Diagnosing Phys: Rozann Lesches MD IMPRESSIONS  1. Limited study for re-evaluation of pericardial effusion.  2. Left ventricular ejection fraction, by estimation, is 60 to 65%. The left ventricle has normal function. The left ventricle has no regional wall motion abnormalities.  3. Right ventricular systolic function is normal. The right ventricular size is normal. There is mildly elevated pulmonary artery systolic pressure. The estimated right ventricular systolic pressure is 33.2 mmHg.  4. A small pericardial effusion is present. The pericardial effusion is circumferential. Large pleural effusion in the left lateral region.  5. The mitral valve is grossly normal. Trivial mitral valve regurgitation.  6. Tricuspid valve regurgitation is mild to moderate.  7. The aortic valve is tricuspid. There is mild thickening of the aortic valve.  8. The inferior vena cava is normal in size with greater than 50% respiratory variability, suggesting right atrial pressure of 3 mmHg. FINDINGS  Left Ventricle: Left ventricular ejection fraction, by estimation, is 60 to 65%. The left ventricle has normal function. The left ventricle has no regional wall motion abnormalities. The left ventricular internal cavity size was normal  in size. There is  borderline left ventricular hypertrophy. Right Ventricle: The right ventricular size is normal. No increase in right ventricular wall thickness. Right ventricular systolic function is normal. There is mildly elevated pulmonary artery systolic pressure. The tricuspid regurgitant velocity is 2.90  m/s, and with an assumed right atrial pressure of 3 mmHg, the estimated right ventricular systolic pressure is 95.1 mmHg. Right Atrium: Prominent Chiari network. Pericardium: A small pericardial effusion is present. The pericardial effusion is circumferential. Mitral Valve: The mitral valve is grossly normal. Trivial mitral valve regurgitation. Tricuspid Valve: The tricuspid valve is grossly normal. Tricuspid valve regurgitation is mild to moderate. Aortic Valve: The aortic valve is tricuspid. There is mild thickening of the aortic valve. There is mild aortic valve annular calcification. Aorta: The aortic root is normal in size and structure. Venous: The inferior vena cava is normal in size with greater than 50% respiratory variability, suggesting right atrial pressure of 3 mmHg. Additional Comments: There is a  large pleural effusion in the left lateral region. LEFT VENTRICLE PLAX 2D LVIDd:         4.31 cm LVIDs:         2.34 cm LV PW:         1.53 cm LV IVS:        0.87 cm  LEFT ATRIUM         Index LA diam:    4.50 cm 2.69 cm/m   AORTA Ao Root diam: 2.60 cm MITRAL VALVE                TRICUSPID VALVE MV Area (PHT): 2.74 cm     TR Peak grad:   33.6 mmHg MV Decel Time: 277 msec     TR Vmax:        290.00 cm/s MV E velocity: 116.00 cm/s MV A velocity: 48.40 cm/s MV E/A ratio:  2.40 Rozann Lesches MD Electronically signed by Rozann Lesches MD Signature Date/Time: 07/22/2020/12:23:10 PM    Final (Updated)    US THORACENTESIS ASP PLEURAL SPACE W/IMG GUIDE  Result Date: 07/22/2020 INDICATION: Left pleural effusion EXAM: ULTRASOUND GUIDED LEFT THORACENTESIS MEDICATIONS: None. COMPLICATIONS: None  immediate. PROCEDURE: The patient has dementia and is not able to consent for herself. I spoke to the patient's daughter by telephone. We discussed the reason for today's thoracentesis as well as the risks (including hemorrhage, infection, and pneumothorax), benefits, and alternatives to ultrasound-guided thoracentesis. We discussed the high likelihood of success of the procedure. The patient's daughter understood and elected for the patient undergo the procedure. Standard time-out was employed. Following sterile skin prep and local anesthetic administration consisting of 1% lidocaine, and following ultrasound localization, a Yueh catheter was advanced without difficulty into the left pleural fluid. Amber fluid was returned, and the needle removed with the catheter left in place. A total of 1100 cc of amber fluid was removed. The catheter was subsequently removed in the skin cleansed and bandaged. No immediate complication was observed. Following the procedure, a chest radiograph demonstrated no significant pneumothorax and marked reduction of the left pleural effusion. FINDINGS: A total of approximately 1100 cc of amber fluid was removed. IMPRESSION: Successful ultrasound guided left thoracentesis yielding 1100 cc of pleural fluid. Electronically Signed   By: Van Clines M.D.   On: 07/22/2020 14:02   US THORACENTESIS ASP PLEURAL SPACE W/IMG GUIDE  Result Date: 07/11/2020 INDICATION: LEFT pleural effusion EXAM: ULTRASOUND GUIDED DIAGNOSTIC AND THERAPEUTIC THORACENTESIS MEDICATIONS: None COMPLICATIONS: None immediate PROCEDURE: An ultrasound guided thoracentesis was thoroughly discussed with the patient and questions answered. The benefits, risks, alternatives and complications were also discussed. The patient understands and wishes to proceed with the procedure. Written consent was obtained. Ultrasound was performed to localize and mark an adequate pocket of fluid in the LEFT chest. The area was then  prepped and draped in the normal sterile fashion. 1% Lidocaine was used for local anesthesia. Under ultrasound guidance a 8 French thoracentesis catheter was introduced. Thoracentesis was performed. The catheter was removed and a dressing applied. FINDINGS: A total of approximately 630 mL of yellow LEFT pleural fluid was removed. Samples were sent to the laboratory as requested by the clinical team. IMPRESSION: Successful ultrasound guided LEFT thoracentesis yielding 630 mL of pleural fluid. Electronically Signed   By: Lavonia Dana M.D.   On: 07/11/2020 11:35    Microbiology Recent Results (from the past 240 hour(s))  Urine Culture     Status: None   Collection Time: 08/13/2020 11:30 AM  Result Value Ref Range Status   Source: URINE  Final   Status: FINAL  Final   Result:   Final    Growth of mixed flora was isolated, suggesting probable contamination. No further testing will be performed. If clinically indicated, recollection using a method to minimize contamination, with prompt transfer to Urine Culture Transport Tube, is  recommended.   Resp Panel by RT PCR (RSV, Flu A&B, Covid) - Nasopharyngeal Swab     Status: None   Collection Time: 08/01/2020  3:25 PM   Specimen: Nasopharyngeal Swab  Result Value Ref Range Status   SARS Coronavirus 2 by RT PCR NEGATIVE NEGATIVE Final    Comment: (NOTE) SARS-CoV-2 target nucleic acids are NOT DETECTED.  The SARS-CoV-2 RNA is generally detectable in upper respiratoy specimens during the acute phase of infection. The lowest concentration of SARS-CoV-2 viral copies this assay can detect is 131 copies/mL. A negative result does not preclude SARS-Cov-2 infection and should not be used as the sole basis for treatment or other patient management decisions. A negative result may occur with  improper specimen collection/handling, submission of specimen other than nasopharyngeal swab, presence of viral mutation(s) within the areas targeted by this assay, and  inadequate number of viral copies (<131 copies/mL). A negative result must be combined with clinical observations, patient history, and epidemiological information. The expected result is Negative.  Fact Sheet for Patients:  PinkCheek.be  Fact Sheet for Healthcare Providers:  GravelBags.it  This test is no t yet approved or cleared by the [place] FDA and  has been authorized for detection and/or diagnosis of SARS-CoV-2 by FDA under an Emergency Use Authorization (EUA). This EUA will remain  in effect (meaning this test can be used) for the duration of the COVID-19 declaration under Section 564(b)(1) of the Act, 21 U.S.C. section 360bbb-3(b)(1), unless the authorization is terminated or revoked sooner.     Influenza A by PCR NEGATIVE NEGATIVE Final   Influenza B by PCR NEGATIVE NEGATIVE Final    Comment: (NOTE) The Xpert Xpress SARS-CoV-2/FLU/RSV assay is intended as an aid in  the diagnosis of influenza from Nasopharyngeal swab specimens and  should not be used as a sole basis for treatment. Nasal washings and  aspirates are unacceptable for Xpert Xpress SARS-CoV-2/FLU/RSV  testing.  Fact Sheet for Patients: PinkCheek.be  Fact Sheet for Healthcare Providers: GravelBags.it  This test is not yet approved or cleared by the [place] FDA and  has been authorized for detection and/or diagnosis of SARS-CoV-2 by  FDA under an Emergency Use Authorization (EUA). This EUA will remain  in effect (meaning this test can be used) for the duration of the  Covid-19 declaration under Section 564(b)(1) of the Act, 21  U.S.C. section 360bbb-3(b)(1), unless the authorization is  terminated or revoked.    Respiratory Syncytial Virus by PCR NEGATIVE NEGATIVE Final    Comment: (NOTE) Fact Sheet for Patients: PinkCheek.be  Fact Sheet for  Healthcare Providers: GravelBags.it  This test is not yet approved or cleared by the [place] FDA and  has been authorized for detection and/or diagnosis of SARS-CoV-2 by  FDA under an Emergency Use Authorization (EUA). This EUA will remain  in effect (meaning this test can be used) for the duration of the  COVID-19 declaration under Section 564(b)(1) of the Act, 21 U.S.C.  section 360bbb-3(b)(1), unless the authorization is terminated or  revoked. Performed at [facility], [address] [place], [place] [address]   Culture, blood (Routine X 2)  w Reflex to ID Panel     Status: None (Preliminary result)   Collection Time: 07/22/20  6:46 AM   Specimen: BLOOD  Result Value Ref Range Status   Specimen Description   Final    BLOOD BLOOD LEFT ARM Performed at [facility], [address], [place], [place] [address]    Special Requests   Final    BOTTLES DRAWN AEROBIC AND ANAEROBIC Blood Culture adequate volume Performed at [facility], [address] [place], [place] [address]    Culture  Setup Time   Final    NO ORGANISMS SEEN ANAEROBIC BOTTLE ONLY Performed at [facility], [address], [place], [place] [address]    Culture   Final    NO GROWTH 2 DAYS Performed at [facility], [place] [address], [place], [place] [address]    Report Status PENDING  Incomplete  Culture, blood (Routine X 2) w Reflex to ID Panel     Status: None (Preliminary result)   Collection Time: 07/22/20  7:04 AM   Specimen: BLOOD  Result Value Ref Range Status   Specimen Description BLOOD BLOOD RIGHT ARM  Final   Special Requests   Final    BOTTLES DRAWN AEROBIC AND ANAEROBIC Blood Culture adequate volume   Culture   Final    NO GROWTH 2 DAYS Performed at [facility], [address], [place], [place] [address]    Report Status PENDING  Incomplete    Lab Basic Metabolic Panel: Recent Labs  Lab 07/19/20 0351 07/21/20 0543 07/21/20 2344 07/22/20 0629  07/23/20 0530  NA 145 144 143 144 146*  K 4.8 4.8 5.1 5.0 5.5*  CL 108 107 110 107 108  CO2 29 29 25 28 25   GLUCOSE 167* 143* 137* 155* 168*  BUN 18 27* 30* 34* 43*  CREATININE 0.70 1.34* 1.69* 1.80* 2.28*  CALCIUM 8.9 8.6* 8.2* 8.5* 8.3*  MG 2.0  --  2.2  --   --   PHOS 2.9  --   --   --   --    Liver Function Tests: Recent Labs  Lab 08/05/2020 1500 07/19/20 0351  AST 22 20  ALT 35 33  ALKPHOS 106 95  BILITOT 0.9 0.7  PROT 6.4* 6.1*  ALBUMIN 2.9* 2.7*   Recent Labs  Lab 08/08/2020 1500  LIPASE 44   No results for input(s): AMMONIA in the last 168 hours. CBC: Recent Labs  Lab 07/15/2020 1500 07/19/20 0351 07/21/20 0543 07/22/20 0629 07/23/20 0530  WBC 9.9 9.5 11.5* 8.9 13.0*  NEUTROABS 8.1*  --   --   --   --   HGB 10.4* 10.0* 8.6* 8.7* 8.9*  HCT 35.1* 35.7* 31.0* 32.4* 33.2*  MCV 87.5 90.6 93.4 95.9 93.3  PLT 315 330 220 176 200   Cardiac Enzymes: No results for input(s): CKTOTAL, CKMB, CKMBINDEX, TROPONINI in the last 168 hours. Sepsis Labs: Recent Labs  Lab 07/28/2020 1500 07/28/2020 1500 07/19/20 0351 07/21/20 0543 07/22/20 0629 07/23/20 0530  PROCALCITON <0.10  --   --   --   --   --   WBC 9.9   < > 9.5 11.5* 8.9 13.0*   < > = values in this interval not displayed.    Procedures/Operations     [employer] 08/19/2020, 9:06 PM

## 2020-08-14 NOTE — Progress Notes (Signed)
MD Memon has seen and evaluated pt. No new orders at this time, MD states he will be calling pt's family with update on current status.

## 2020-08-14 NOTE — Progress Notes (Signed)
Dr McDowell's rounding note reviewed from yesterday. We will follow up on the further discussions that the primary team and palliative care have ongoing with family regarding goals of care. No additional cardiac testing or interventions are planned at this time   Carlyle Dolly MD

## 2020-08-14 NOTE — Progress Notes (Signed)
Present with Mrs Vanmetre's family, first her husband and then her children for emotional and spiritual support after her death.

## 2020-08-14 NOTE — Progress Notes (Signed)
Pt's resp fast 32/min, shallow and wet sounding. SaO2 77%, HR 93, B/P wnl. Son Aaron Edelman returned my call and I advised him of pt's declining condition. He states that he is on his way to the hospital. MD Memon updated on current pt status.

## 2020-08-14 NOTE — Progress Notes (Signed)
Pt responsive only to painful stimuli. Quiet, no moaning or other verbal response. Respirations 24/min, HR 51. Upon initial assessment, pt on 5lpm HFNC with SaO2 95%. O2 decreased to 3 lpm, SaO2 steady at 89-90% currently. MD Memon notified of change in patient responsiveness. Other VSS. Dr. Roderic Palau returned [place] and updated on pt situation via phone. MD states he is coming to evaluate patient.

## 2020-08-14 DEATH — deceased

## 2020-08-21 ENCOUNTER — Ambulatory Visit: Payer: BLUE CROSS/BLUE SHIELD | Admitting: Cardiology

## 2020-10-14 NOTE — Telephone Encounter (Signed)
Closed encounter off my list.

## 2021-06-21 IMAGING — DX DG CHEST 1V PORT
1 series · 1 of 1 positions shown · non-contrast
Comparison: July 18, 2020

CLINICAL DATA: Shortness of breath

EXAM:
PORTABLE CHEST 1 VIEW

[chest ap]
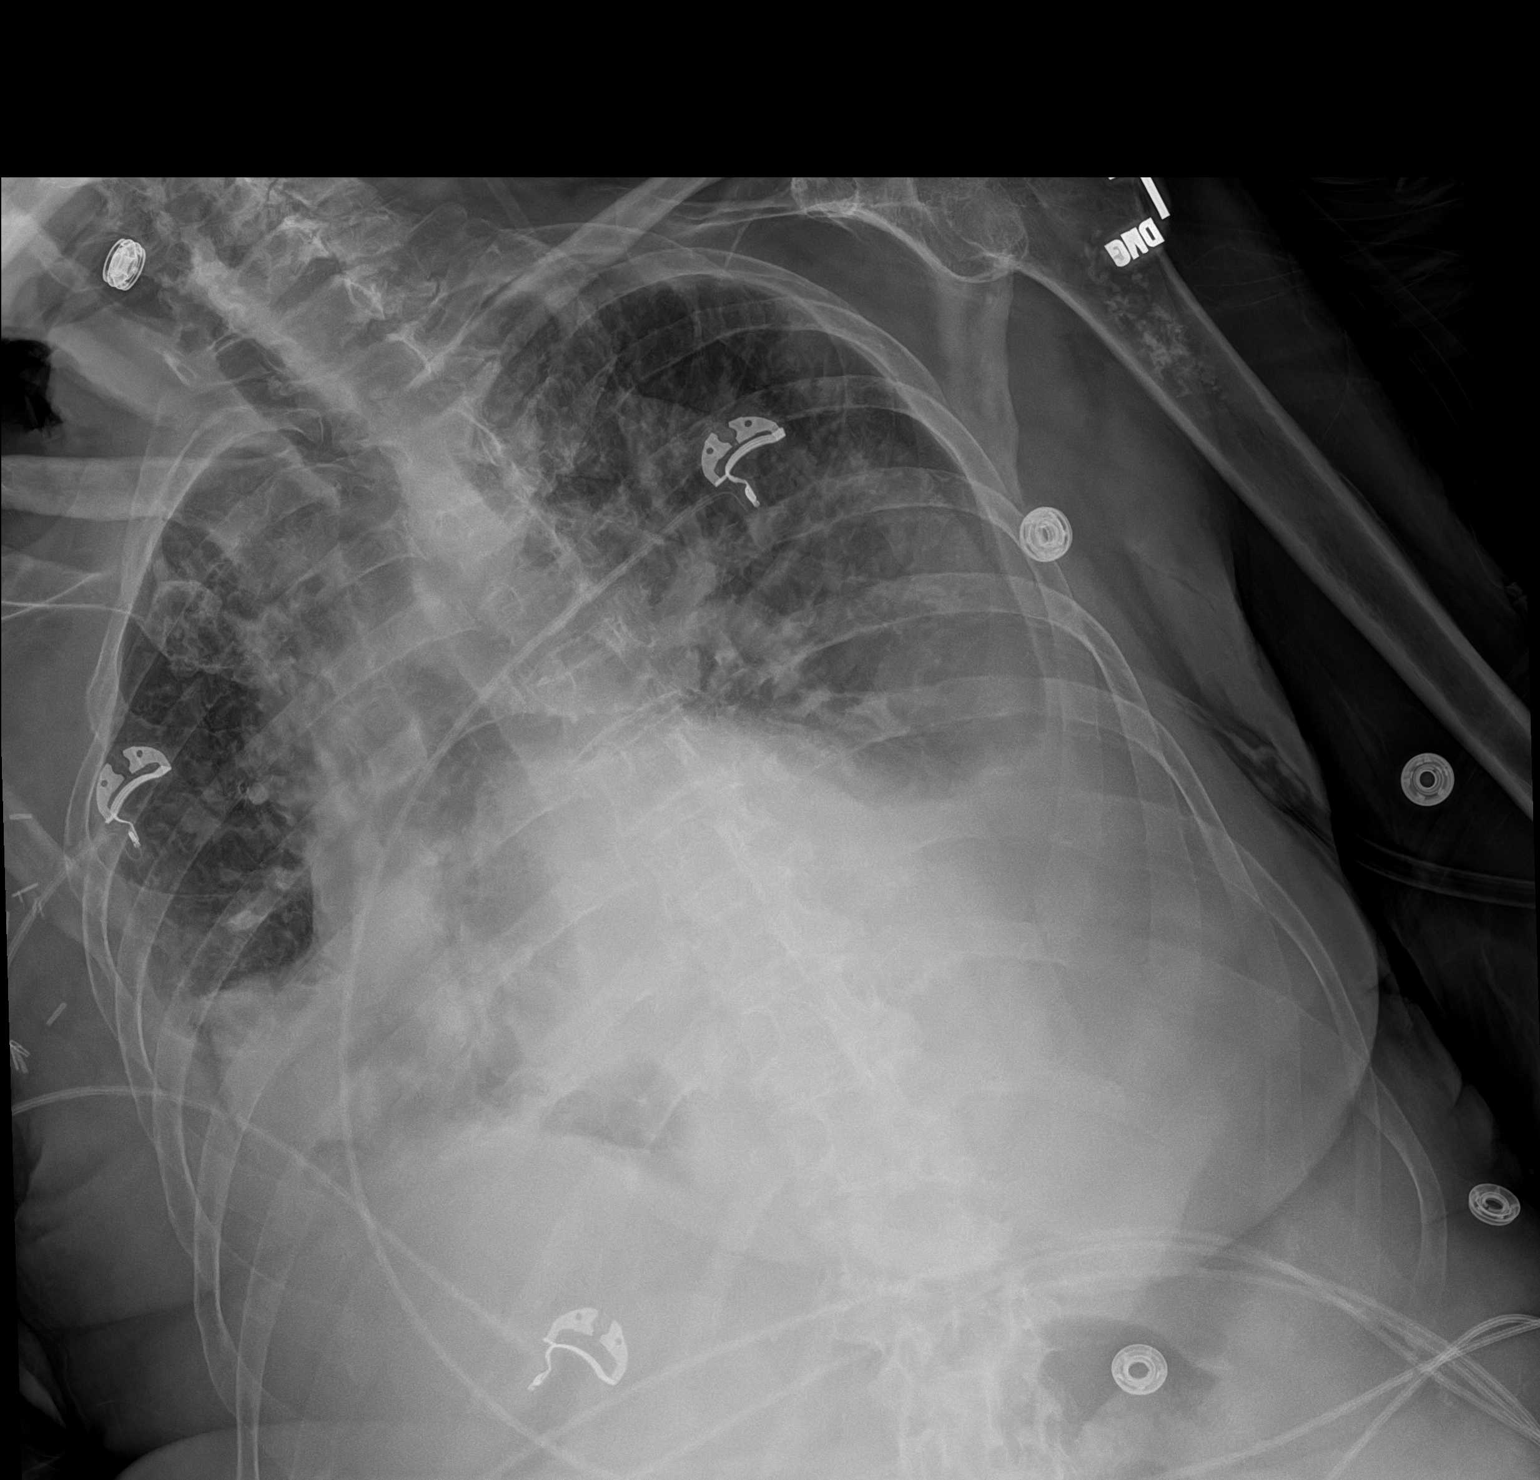

[1 of 1 positions shown; findings below may reference images not displayed]

FINDINGS: There is cardiomegaly. There are persistent bilateral pleural
effusions which have increased in size from the prior study. There
are findings of congestive heart failure. There is no pneumothorax.
Bibasilar airspace opacities are noted in favored to represent
compressive atelectasis. There is a sclerotic lesion in the proximal
left humerus favored to represent a bone infarct or enchondroma.
IMPRESSION: 1. Cardiomegaly with findings of congestive heart failure.
2. Increasing bilateral pleural effusions with adjacent compressive
atelectasis.

## 2021-06-22 IMAGING — DX DG CHEST 1V
1 series · 1 of 1 positions shown · non-contrast
Comparison: 07/22/2020 at [DATE] a.m.

CLINICAL DATA: Pleural effusion, status post left thoracentesis

EXAM:
CHEST  1 VIEW

[chest ap]
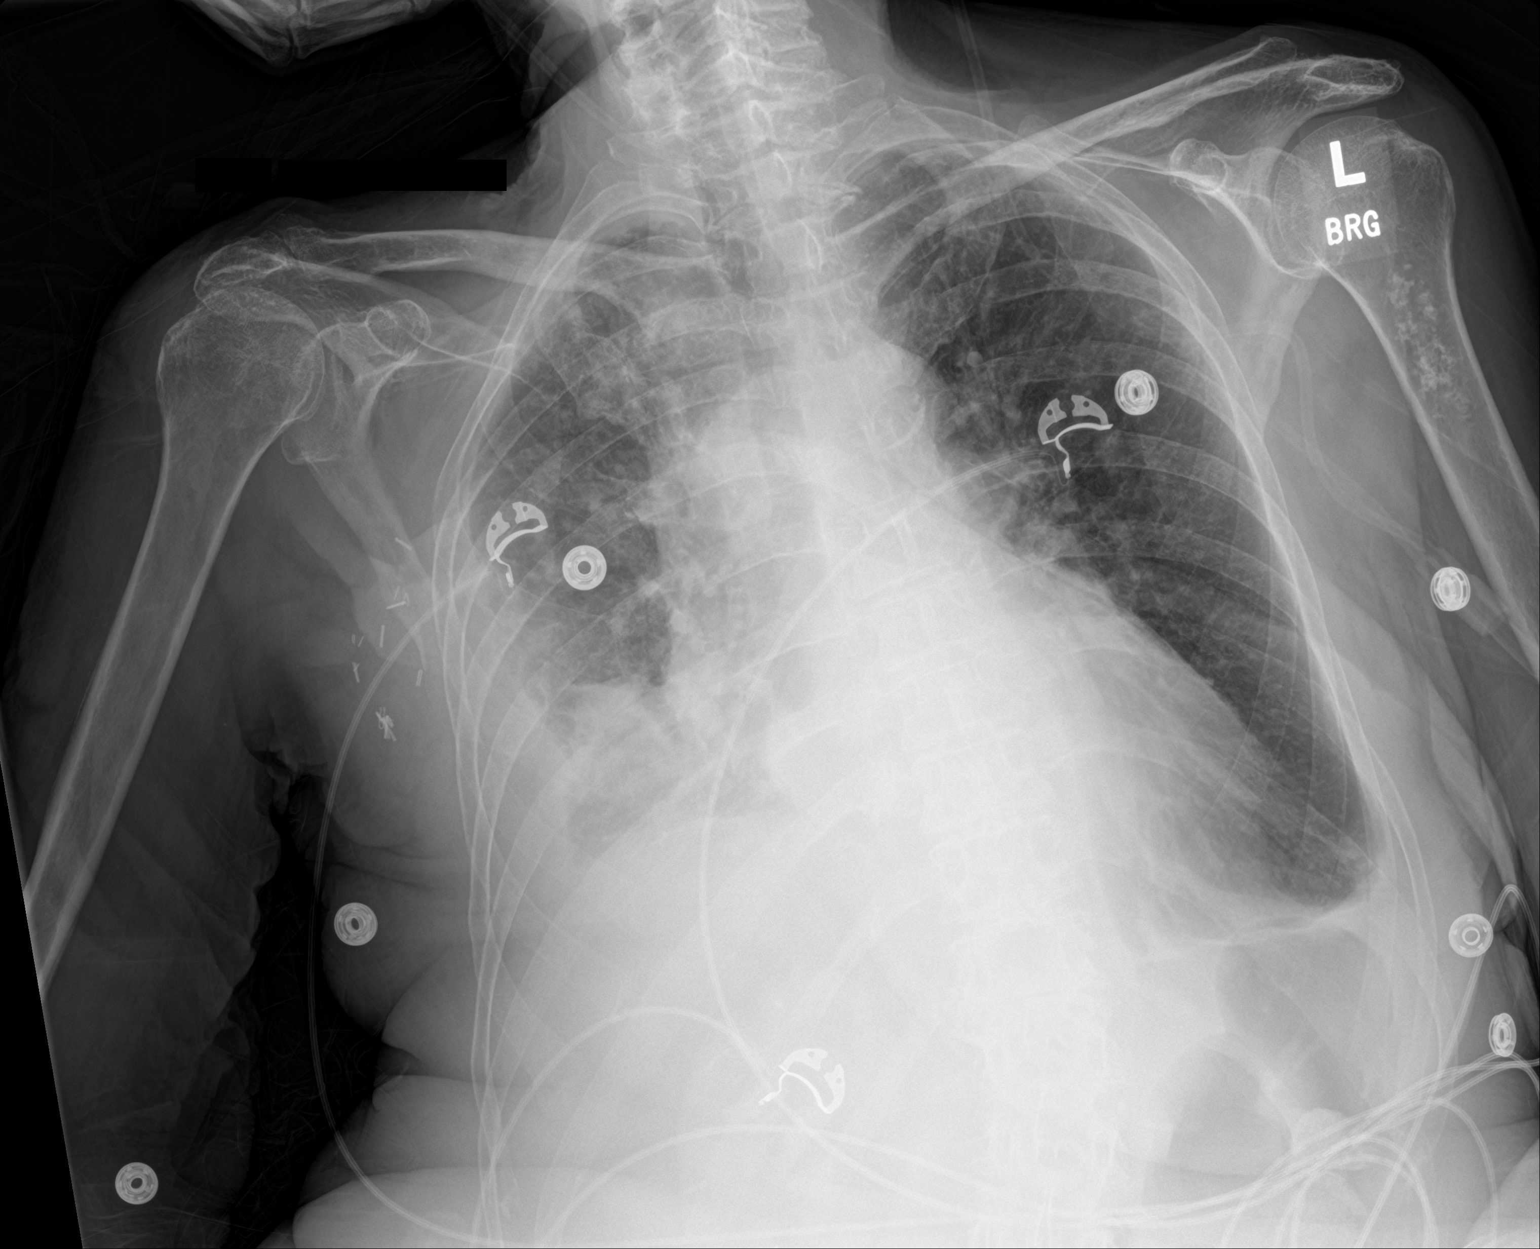

[1 of 1 positions shown; findings below may reference images not displayed]

FINDINGS: Prominent reduction in the size of the left pleural effusion
compared to earlier exam, with only mild residual blunting of left
costophrenic angle. No significant pneumothorax is identified.

Persistent moderate right pleural effusion.

Moderate enlargement of the cardiopericardial silhouette noted.
Atherosclerotic calcification of the aortic arch. Chondroid lesion
of the left proximal humerus, unchanged.
IMPRESSION: 1. Prominent reduction in the size of the left pleural effusion,
with only mild residual blunting of the left costophrenic angle. No
pneumothorax.
2. Persistent moderate right pleural effusion.
3. Moderate enlargement of the cardiopericardial silhouette.

## 2021-06-22 IMAGING — US US THORACENTESIS ASP PLEURAL SPACE W/IMG GUIDE
1 series · 2 of 2 positions shown · non-contrast
Comparison: none

INDICATION: Left pleural effusion

[Series 1: us thoracentesis asp pleural space w/img guide · 0.25mm/px · 2 of 2 slices shown]
[im 1/2]
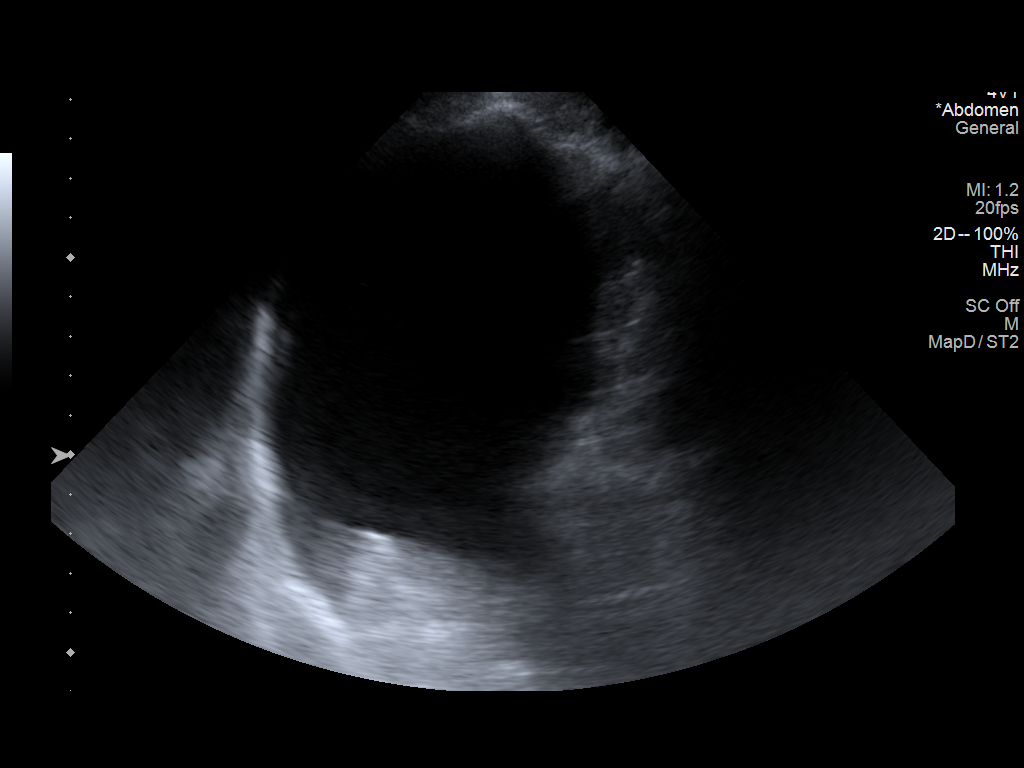
[im 2/2]
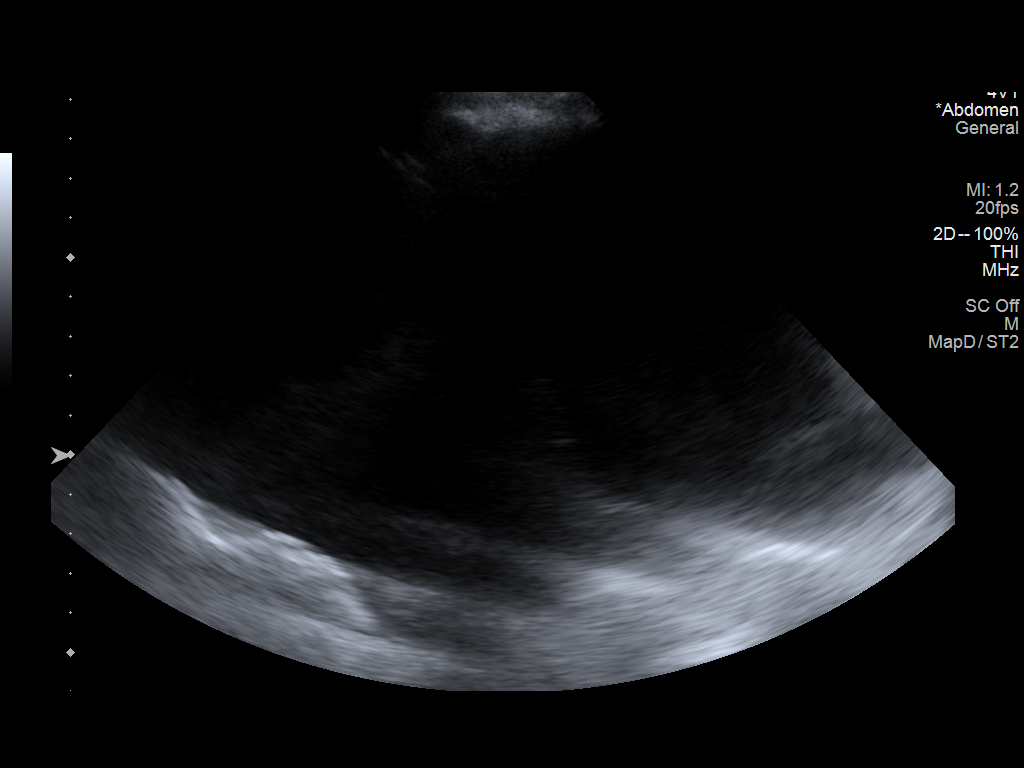

[2 of 2 positions shown; findings below may reference images not displayed]

EXAM:
ULTRASOUND GUIDED LEFT THORACENTESIS

MEDICATIONS:
None.

COMPLICATIONS:
None immediate.

PROCEDURE:
The patient has dementia and is not able to consent for herself. I
spoke to the patient's daughter by telephone. We discussed the
reason for today's thoracentesis as well as the risks (including
hemorrhage, infection, and pneumothorax), benefits, and alternatives
to ultrasound-guided thoracentesis. We discussed the high likelihood
of success of the procedure. The patient's daughter understood and
elected for the patient undergo the procedure.

Standard time-out was employed. Following sterile skin prep and
local anesthetic administration consisting of 1% lidocaine, and
following ultrasound localization, a Yueh catheter was advanced
without difficulty into the left pleural fluid. Amber fluid was
returned, and the needle removed with the catheter left in place. A
total of 1199 cc of amber fluid was removed. The catheter was
subsequently removed in the skin cleansed and bandaged. No immediate
complication was observed.

Following the procedure, a chest radiograph demonstrated no
significant pneumothorax and marked reduction of the left pleural
effusion.
FINDINGS: A total of approximately 1199 cc of amber fluid was removed.
IMPRESSION: Successful ultrasound guided left thoracentesis yielding 1199 cc of
pleural fluid.

## 2021-06-22 IMAGING — DX DG CHEST 1V PORT
1 series · 1 of 1 positions shown · non-contrast
Comparison: 07/21/2020

CLINICAL DATA: Shortness of breath

EXAM:
PORTABLE CHEST 1 VIEW

[chest ap]
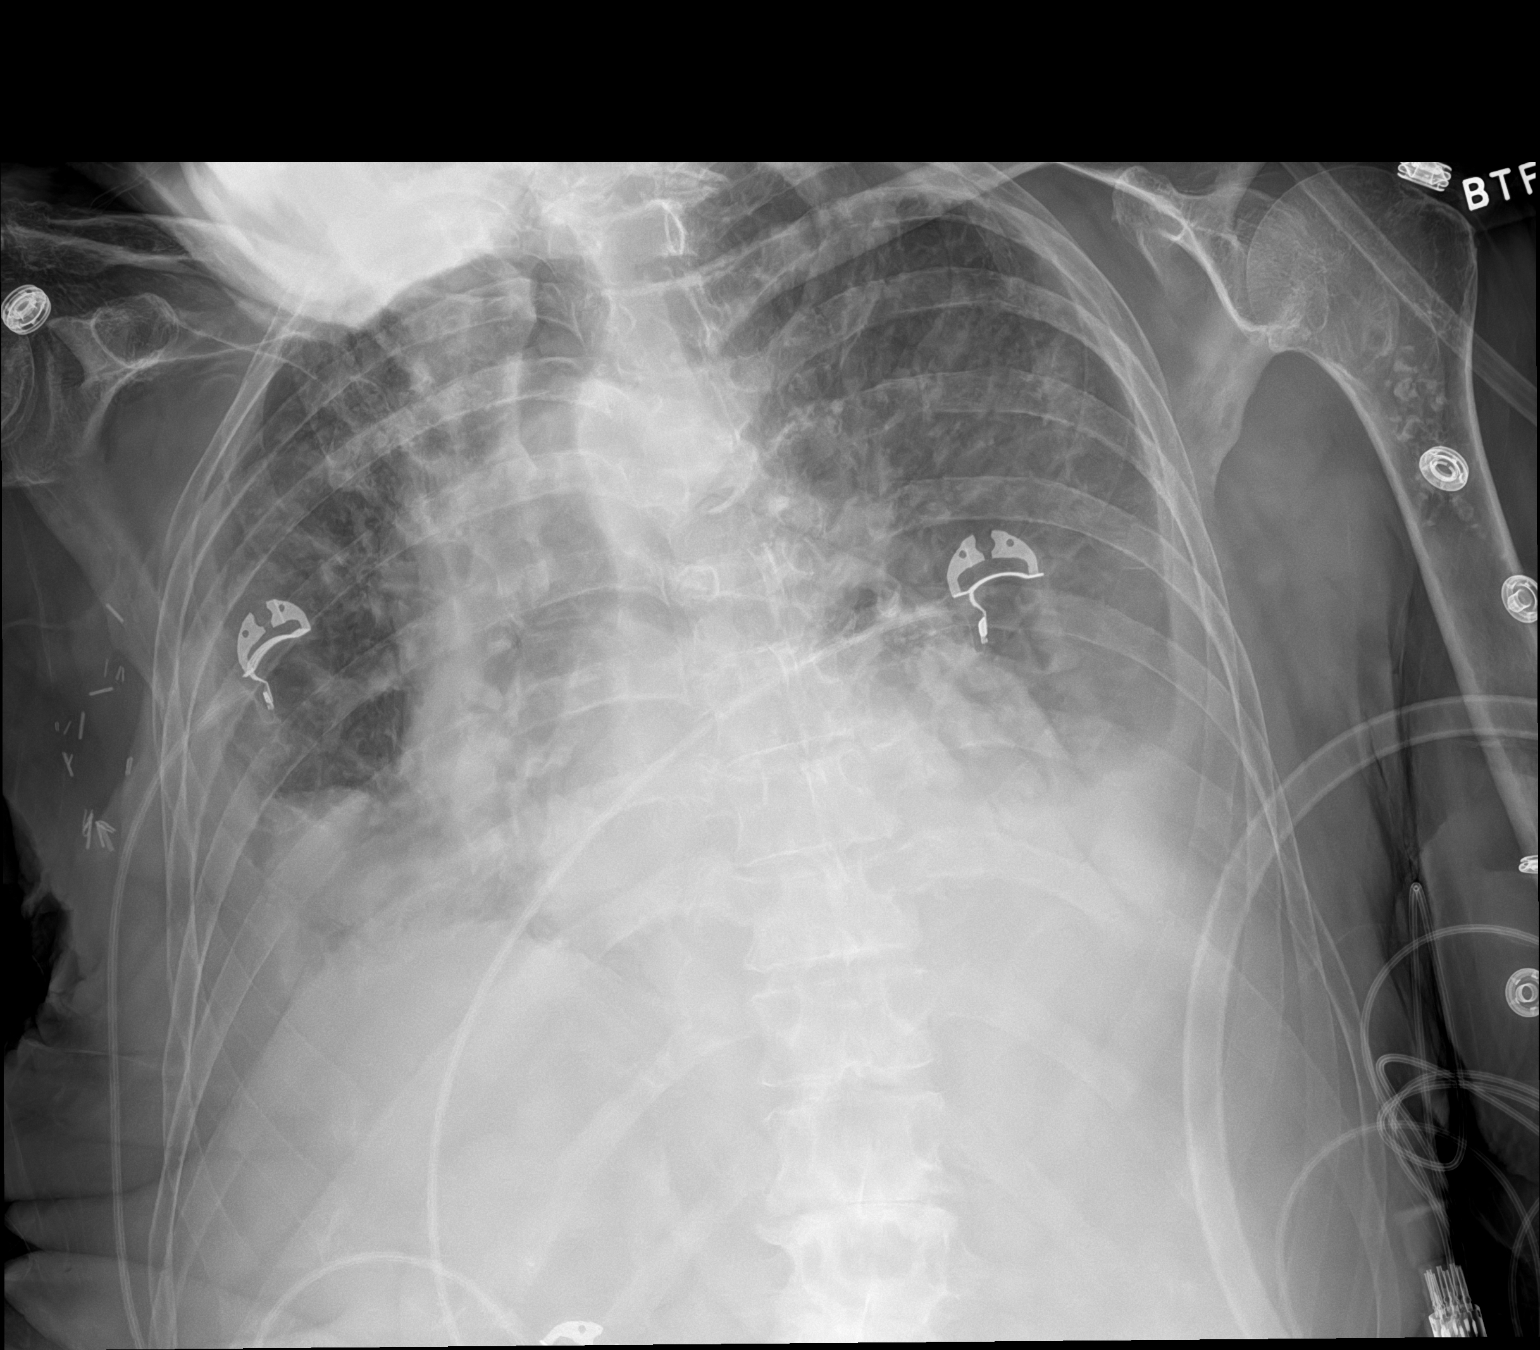

[1 of 1 positions shown; findings below may reference images not displayed]

FINDINGS: Cardiac shadow is stable. Bilateral pleural effusions are again
identified. Central vascular congestion is again seen. Right basilar
consolidation is noted stable from the prior exam. Stable
calcifications in the proximal left humerus. No other bony
abnormality is noted.
IMPRESSION: Persistent bilateral pleural effusions and vascular congestion.

Consolidation in the right base is again noted.
